# Patient Record
Sex: Female | Born: 1945 | Race: Black or African American | Hispanic: No | Smoking: Never smoker
Health system: Southern US, Community
[De-identification: ages and names within clinical notes are randomized; demographics above are authoritative.]

## PROBLEM LIST (undated history)

## (undated) ENCOUNTER — Ambulatory Visit (HOSPITAL_COMMUNITY): Admission: EM | Payer: Self-pay | Source: Home / Self Care

## (undated) DIAGNOSIS — E119 Type 2 diabetes mellitus without complications: Secondary | ICD-10-CM

## (undated) DIAGNOSIS — I219 Acute myocardial infarction, unspecified: Secondary | ICD-10-CM

## (undated) DIAGNOSIS — E785 Hyperlipidemia, unspecified: Secondary | ICD-10-CM

## (undated) DIAGNOSIS — I10 Essential (primary) hypertension: Secondary | ICD-10-CM

## (undated) DIAGNOSIS — G459 Transient cerebral ischemic attack, unspecified: Secondary | ICD-10-CM

## (undated) DIAGNOSIS — M1990 Unspecified osteoarthritis, unspecified site: Secondary | ICD-10-CM

## (undated) DIAGNOSIS — E7800 Pure hypercholesterolemia, unspecified: Secondary | ICD-10-CM

## (undated) DIAGNOSIS — B029 Zoster without complications: Secondary | ICD-10-CM

## (undated) DIAGNOSIS — G4733 Obstructive sleep apnea (adult) (pediatric): Secondary | ICD-10-CM

## (undated) DIAGNOSIS — I340 Nonrheumatic mitral (valve) insufficiency: Secondary | ICD-10-CM

## (undated) DIAGNOSIS — G4719 Other hypersomnia: Secondary | ICD-10-CM

## (undated) DIAGNOSIS — J189 Pneumonia, unspecified organism: Secondary | ICD-10-CM

## (undated) DIAGNOSIS — H409 Unspecified glaucoma: Secondary | ICD-10-CM

## (undated) HISTORY — PX: EYE SURGERY: SHX253

## (undated) HISTORY — DX: Transient cerebral ischemic attack, unspecified: G45.9

## (undated) HISTORY — DX: Obstructive sleep apnea (adult) (pediatric): G47.33

## (undated) HISTORY — DX: Type 2 diabetes mellitus without complications: E11.9

## (undated) HISTORY — PX: ABDOMINAL SURGERY: SHX537

## (undated) HISTORY — DX: Nonrheumatic mitral (valve) insufficiency: I34.0

## (undated) HISTORY — PX: ROTATOR CUFF REPAIR: SHX139

## (undated) HISTORY — DX: Other hypersomnia: G47.19

## (undated) HISTORY — DX: Essential (primary) hypertension: I10

## (undated) HISTORY — DX: Acute myocardial infarction, unspecified: I21.9

## (undated) HISTORY — DX: Hyperlipidemia, unspecified: E78.5

---

## 1998-03-26 ENCOUNTER — Emergency Department (HOSPITAL_COMMUNITY): Admission: EM | Admit: 1998-03-26 | Discharge: 1998-03-26 | Payer: Self-pay | Admitting: *Deleted

## 1998-05-05 ENCOUNTER — Inpatient Hospital Stay (HOSPITAL_COMMUNITY): Admission: EM | Admit: 1998-05-05 | Discharge: 1998-05-06 | Payer: Self-pay | Admitting: Emergency Medicine

## 1998-05-07 ENCOUNTER — Emergency Department (HOSPITAL_COMMUNITY): Admission: EM | Admit: 1998-05-07 | Discharge: 1998-05-07 | Payer: Self-pay | Admitting: Emergency Medicine

## 1998-05-12 ENCOUNTER — Ambulatory Visit (HOSPITAL_COMMUNITY): Admission: RE | Admit: 1998-05-12 | Discharge: 1998-05-12 | Payer: Self-pay | Admitting: *Deleted

## 1998-05-19 ENCOUNTER — Emergency Department (HOSPITAL_COMMUNITY): Admission: EM | Admit: 1998-05-19 | Discharge: 1998-05-19 | Payer: Self-pay | Admitting: Emergency Medicine

## 1998-05-26 ENCOUNTER — Other Ambulatory Visit: Admission: RE | Admit: 1998-05-26 | Discharge: 1998-05-26 | Payer: Self-pay | Admitting: General Surgery

## 1998-06-02 ENCOUNTER — Ambulatory Visit (HOSPITAL_COMMUNITY): Admission: RE | Admit: 1998-06-02 | Discharge: 1998-06-02 | Payer: Self-pay | Admitting: *Deleted

## 1998-08-05 ENCOUNTER — Emergency Department (HOSPITAL_COMMUNITY): Admission: EM | Admit: 1998-08-05 | Discharge: 1998-08-05 | Payer: Self-pay | Admitting: *Deleted

## 1998-09-14 ENCOUNTER — Encounter: Payer: Self-pay | Admitting: Family Medicine

## 1998-09-14 ENCOUNTER — Ambulatory Visit (HOSPITAL_COMMUNITY): Admission: RE | Admit: 1998-09-14 | Discharge: 1998-09-14 | Payer: Self-pay | Admitting: Family Medicine

## 1998-11-17 ENCOUNTER — Other Ambulatory Visit: Admission: RE | Admit: 1998-11-17 | Discharge: 1998-11-17 | Payer: Self-pay | Admitting: Family Medicine

## 1998-11-22 ENCOUNTER — Encounter: Payer: Self-pay | Admitting: Family Medicine

## 1998-11-22 ENCOUNTER — Ambulatory Visit (HOSPITAL_COMMUNITY): Admission: RE | Admit: 1998-11-22 | Discharge: 1998-11-22 | Payer: Self-pay | Admitting: Family Medicine

## 1999-05-15 ENCOUNTER — Encounter: Payer: Self-pay | Admitting: *Deleted

## 1999-05-15 ENCOUNTER — Encounter: Payer: Self-pay | Admitting: Emergency Medicine

## 1999-05-15 ENCOUNTER — Emergency Department (HOSPITAL_COMMUNITY): Admission: EM | Admit: 1999-05-15 | Discharge: 1999-05-15 | Payer: Self-pay | Admitting: Emergency Medicine

## 1999-07-04 ENCOUNTER — Ambulatory Visit (HOSPITAL_COMMUNITY): Admission: RE | Admit: 1999-07-04 | Discharge: 1999-07-04 | Payer: Self-pay | Admitting: Family Medicine

## 1999-07-04 ENCOUNTER — Encounter: Payer: Self-pay | Admitting: Family Medicine

## 1999-10-05 ENCOUNTER — Encounter: Payer: Self-pay | Admitting: Emergency Medicine

## 1999-10-05 ENCOUNTER — Emergency Department (HOSPITAL_COMMUNITY): Admission: EM | Admit: 1999-10-05 | Discharge: 1999-10-05 | Payer: Self-pay | Admitting: Emergency Medicine

## 1999-11-19 ENCOUNTER — Encounter: Payer: Self-pay | Admitting: Emergency Medicine

## 1999-11-19 ENCOUNTER — Emergency Department (HOSPITAL_COMMUNITY): Admission: EM | Admit: 1999-11-19 | Discharge: 1999-11-19 | Payer: Self-pay | Admitting: Emergency Medicine

## 2000-05-06 ENCOUNTER — Encounter: Admission: RE | Admit: 2000-05-06 | Discharge: 2000-05-06 | Payer: Self-pay | Admitting: Family Medicine

## 2000-05-06 ENCOUNTER — Encounter: Payer: Self-pay | Admitting: Family Medicine

## 2000-05-08 ENCOUNTER — Other Ambulatory Visit: Admission: RE | Admit: 2000-05-08 | Discharge: 2000-05-08 | Payer: Self-pay | Admitting: Internal Medicine

## 2000-05-09 ENCOUNTER — Encounter: Payer: Self-pay | Admitting: Family Medicine

## 2000-05-09 ENCOUNTER — Encounter: Admission: RE | Admit: 2000-05-09 | Discharge: 2000-05-09 | Payer: Self-pay | Admitting: Family Medicine

## 2000-08-11 ENCOUNTER — Encounter: Payer: Self-pay | Admitting: Emergency Medicine

## 2000-08-11 ENCOUNTER — Emergency Department (HOSPITAL_COMMUNITY): Admission: EM | Admit: 2000-08-11 | Discharge: 2000-08-11 | Payer: Self-pay | Admitting: Emergency Medicine

## 2000-09-11 ENCOUNTER — Encounter: Admission: RE | Admit: 2000-09-11 | Discharge: 2000-09-19 | Payer: Self-pay | Admitting: Family Medicine

## 2001-01-28 ENCOUNTER — Emergency Department (HOSPITAL_COMMUNITY): Admission: EM | Admit: 2001-01-28 | Discharge: 2001-01-28 | Payer: Self-pay | Admitting: Emergency Medicine

## 2001-01-28 ENCOUNTER — Ambulatory Visit (HOSPITAL_COMMUNITY): Admission: RE | Admit: 2001-01-28 | Discharge: 2001-01-28 | Payer: Self-pay | Admitting: Family Medicine

## 2001-01-28 ENCOUNTER — Encounter: Payer: Self-pay | Admitting: Emergency Medicine

## 2001-05-03 ENCOUNTER — Encounter: Payer: Self-pay | Admitting: Emergency Medicine

## 2001-05-03 ENCOUNTER — Emergency Department (HOSPITAL_COMMUNITY): Admission: EM | Admit: 2001-05-03 | Discharge: 2001-05-03 | Payer: Self-pay | Admitting: *Deleted

## 2001-07-31 ENCOUNTER — Encounter: Payer: Self-pay | Admitting: Orthopedic Surgery

## 2001-07-31 ENCOUNTER — Encounter: Admission: RE | Admit: 2001-07-31 | Discharge: 2001-07-31 | Payer: Self-pay | Admitting: Orthopedic Surgery

## 2002-01-07 ENCOUNTER — Ambulatory Visit (HOSPITAL_COMMUNITY): Admission: RE | Admit: 2002-01-07 | Discharge: 2002-01-07 | Payer: Self-pay | Admitting: *Deleted

## 2002-03-13 ENCOUNTER — Ambulatory Visit (HOSPITAL_COMMUNITY): Admission: RE | Admit: 2002-03-13 | Discharge: 2002-03-13 | Payer: Self-pay | Admitting: *Deleted

## 2002-05-12 ENCOUNTER — Ambulatory Visit (HOSPITAL_COMMUNITY): Admission: RE | Admit: 2002-05-12 | Discharge: 2002-05-12 | Payer: Self-pay | Admitting: *Deleted

## 2002-05-12 ENCOUNTER — Encounter: Payer: Self-pay | Admitting: *Deleted

## 2002-07-01 ENCOUNTER — Encounter: Payer: Self-pay | Admitting: Family Medicine

## 2002-07-01 ENCOUNTER — Ambulatory Visit (HOSPITAL_COMMUNITY): Admission: RE | Admit: 2002-07-01 | Discharge: 2002-07-01 | Payer: Self-pay | Admitting: Family Medicine

## 2002-07-30 ENCOUNTER — Ambulatory Visit (HOSPITAL_COMMUNITY): Admission: RE | Admit: 2002-07-30 | Discharge: 2002-07-30 | Payer: Self-pay | Admitting: Cardiovascular Disease

## 2002-07-30 ENCOUNTER — Encounter: Payer: Self-pay | Admitting: Cardiovascular Disease

## 2002-12-09 ENCOUNTER — Emergency Department (HOSPITAL_COMMUNITY): Admission: EM | Admit: 2002-12-09 | Discharge: 2002-12-09 | Payer: Self-pay | Admitting: *Deleted

## 2003-05-13 ENCOUNTER — Encounter: Payer: Self-pay | Admitting: Emergency Medicine

## 2003-05-13 ENCOUNTER — Emergency Department (HOSPITAL_COMMUNITY): Admission: AD | Admit: 2003-05-13 | Discharge: 2003-05-13 | Payer: Self-pay | Admitting: Emergency Medicine

## 2003-08-09 ENCOUNTER — Encounter: Payer: Self-pay | Admitting: Family Medicine

## 2003-08-09 ENCOUNTER — Encounter: Admission: RE | Admit: 2003-08-09 | Discharge: 2003-08-09 | Payer: Self-pay | Admitting: Family Medicine

## 2003-09-14 ENCOUNTER — Emergency Department (HOSPITAL_COMMUNITY): Admission: EM | Admit: 2003-09-14 | Discharge: 2003-09-14 | Payer: Self-pay | Admitting: Emergency Medicine

## 2005-12-20 ENCOUNTER — Emergency Department (HOSPITAL_COMMUNITY): Admission: EM | Admit: 2005-12-20 | Discharge: 2005-12-20 | Payer: Self-pay | Admitting: Emergency Medicine

## 2005-12-28 ENCOUNTER — Emergency Department (HOSPITAL_COMMUNITY): Admission: EM | Admit: 2005-12-28 | Discharge: 2005-12-28 | Payer: Self-pay | Admitting: Emergency Medicine

## 2005-12-29 ENCOUNTER — Emergency Department (HOSPITAL_COMMUNITY): Admission: EM | Admit: 2005-12-29 | Discharge: 2005-12-29 | Payer: Self-pay | Admitting: Emergency Medicine

## 2005-12-30 ENCOUNTER — Emergency Department (HOSPITAL_COMMUNITY): Admission: EM | Admit: 2005-12-30 | Discharge: 2005-12-30 | Payer: Self-pay | Admitting: Emergency Medicine

## 2006-01-01 ENCOUNTER — Emergency Department (HOSPITAL_COMMUNITY): Admission: EM | Admit: 2006-01-01 | Discharge: 2006-01-01 | Payer: Self-pay | Admitting: Emergency Medicine

## 2006-02-20 ENCOUNTER — Encounter: Admission: RE | Admit: 2006-02-20 | Discharge: 2006-02-20 | Payer: Self-pay | Admitting: Orthopedic Surgery

## 2006-03-20 ENCOUNTER — Encounter: Payer: Self-pay | Admitting: Emergency Medicine

## 2006-03-28 ENCOUNTER — Encounter: Admission: RE | Admit: 2006-03-28 | Discharge: 2006-03-28 | Payer: Self-pay | Admitting: Orthopedic Surgery

## 2006-04-04 ENCOUNTER — Encounter: Admission: RE | Admit: 2006-04-04 | Discharge: 2006-07-03 | Payer: Self-pay | Admitting: Orthopedic Surgery

## 2006-10-08 ENCOUNTER — Encounter: Admission: RE | Admit: 2006-10-08 | Discharge: 2006-10-08 | Payer: Self-pay | Admitting: Orthopedic Surgery

## 2006-12-10 ENCOUNTER — Ambulatory Visit (HOSPITAL_COMMUNITY): Admission: RE | Admit: 2006-12-10 | Discharge: 2006-12-11 | Payer: Self-pay | Admitting: Orthopedic Surgery

## 2007-01-16 ENCOUNTER — Encounter: Admission: RE | Admit: 2007-01-16 | Discharge: 2007-04-16 | Payer: Self-pay | Admitting: Orthopedic Surgery

## 2007-02-03 ENCOUNTER — Emergency Department (HOSPITAL_COMMUNITY): Admission: EM | Admit: 2007-02-03 | Discharge: 2007-02-03 | Payer: Self-pay | Admitting: Emergency Medicine

## 2007-03-17 ENCOUNTER — Encounter (INDEPENDENT_AMBULATORY_CARE_PROVIDER_SITE_OTHER): Payer: Self-pay | Admitting: Specialist

## 2007-03-17 ENCOUNTER — Ambulatory Visit (HOSPITAL_COMMUNITY): Admission: RE | Admit: 2007-03-17 | Discharge: 2007-03-17 | Payer: Self-pay | Admitting: Gastroenterology

## 2007-12-04 ENCOUNTER — Emergency Department (HOSPITAL_COMMUNITY): Admission: EM | Admit: 2007-12-04 | Discharge: 2007-12-04 | Payer: Self-pay | Admitting: Emergency Medicine

## 2007-12-07 ENCOUNTER — Emergency Department (HOSPITAL_COMMUNITY): Admission: EM | Admit: 2007-12-07 | Discharge: 2007-12-07 | Payer: Self-pay | Admitting: Emergency Medicine

## 2007-12-08 ENCOUNTER — Emergency Department (HOSPITAL_COMMUNITY): Admission: EM | Admit: 2007-12-08 | Discharge: 2007-12-08 | Payer: Self-pay | Admitting: Emergency Medicine

## 2007-12-08 ENCOUNTER — Encounter: Payer: Self-pay | Admitting: Emergency Medicine

## 2008-02-25 ENCOUNTER — Encounter: Admission: RE | Admit: 2008-02-25 | Discharge: 2008-02-25 | Payer: Self-pay | Admitting: Orthopedic Surgery

## 2008-05-20 ENCOUNTER — Encounter: Admission: RE | Admit: 2008-05-20 | Discharge: 2008-05-20 | Payer: Self-pay | Admitting: Orthopedic Surgery

## 2008-06-29 ENCOUNTER — Inpatient Hospital Stay (HOSPITAL_COMMUNITY): Admission: RE | Admit: 2008-06-29 | Discharge: 2008-06-30 | Payer: Self-pay | Admitting: Orthopedic Surgery

## 2008-08-04 ENCOUNTER — Encounter: Admission: RE | Admit: 2008-08-04 | Discharge: 2008-10-04 | Payer: Self-pay | Admitting: Orthopedic Surgery

## 2008-09-08 ENCOUNTER — Encounter: Admission: RE | Admit: 2008-09-08 | Discharge: 2008-09-08 | Payer: Self-pay | Admitting: Orthopedic Surgery

## 2008-10-21 ENCOUNTER — Encounter: Admission: RE | Admit: 2008-10-21 | Discharge: 2008-10-21 | Payer: Self-pay | Admitting: Family Medicine

## 2008-12-10 ENCOUNTER — Encounter: Admission: RE | Admit: 2008-12-10 | Discharge: 2008-12-10 | Payer: Self-pay | Admitting: Family Medicine

## 2009-09-21 ENCOUNTER — Emergency Department (HOSPITAL_COMMUNITY): Admission: EM | Admit: 2009-09-21 | Discharge: 2009-09-21 | Payer: Self-pay | Admitting: Emergency Medicine

## 2010-01-31 ENCOUNTER — Encounter: Admission: RE | Admit: 2010-01-31 | Discharge: 2010-01-31 | Payer: Self-pay | Admitting: Orthopedic Surgery

## 2010-03-24 ENCOUNTER — Encounter: Admission: RE | Admit: 2010-03-24 | Discharge: 2010-03-24 | Payer: Self-pay | Admitting: Orthopedic Surgery

## 2010-06-06 ENCOUNTER — Encounter: Admission: RE | Admit: 2010-06-06 | Discharge: 2010-06-06 | Payer: Self-pay | Admitting: Orthopedic Surgery

## 2010-12-10 ENCOUNTER — Emergency Department (HOSPITAL_COMMUNITY)
Admission: EM | Admit: 2010-12-10 | Discharge: 2010-12-10 | Payer: Self-pay | Source: Home / Self Care | Admitting: Emergency Medicine

## 2010-12-10 ENCOUNTER — Encounter: Payer: Self-pay | Admitting: Orthopedic Surgery

## 2011-02-03 ENCOUNTER — Emergency Department (HOSPITAL_COMMUNITY): Payer: Medicare Other

## 2011-02-03 ENCOUNTER — Emergency Department (HOSPITAL_COMMUNITY)
Admission: EM | Admit: 2011-02-03 | Discharge: 2011-02-03 | Disposition: A | Discharge status: Home / Self Care | Payer: Medicare Other | Attending: Emergency Medicine | Admitting: Emergency Medicine

## 2011-02-03 DIAGNOSIS — R5383 Other fatigue: Secondary | ICD-10-CM | POA: Insufficient documentation

## 2011-02-03 DIAGNOSIS — H409 Unspecified glaucoma: Secondary | ICD-10-CM | POA: Insufficient documentation

## 2011-02-03 DIAGNOSIS — R5381 Other malaise: Secondary | ICD-10-CM | POA: Insufficient documentation

## 2011-02-03 DIAGNOSIS — R079 Chest pain, unspecified: Secondary | ICD-10-CM | POA: Insufficient documentation

## 2011-02-03 DIAGNOSIS — I10 Essential (primary) hypertension: Secondary | ICD-10-CM | POA: Insufficient documentation

## 2011-02-03 DIAGNOSIS — E109 Type 1 diabetes mellitus without complications: Secondary | ICD-10-CM | POA: Insufficient documentation

## 2011-02-03 LAB — CBC
HCT: 33.9 % — ABNORMAL LOW (ref 36.0–46.0)
MCH: 32.1 pg (ref 26.0–34.0)
MCHC: 33 g/dL (ref 30.0–36.0)
MCV: 97.1 fL (ref 78.0–100.0)
Platelets: 229 10*3/uL (ref 150–400)
RDW: 13.6 % (ref 11.5–15.5)
WBC: 5.3 10*3/uL (ref 4.0–10.5)

## 2011-02-03 LAB — URINALYSIS, ROUTINE W REFLEX MICROSCOPIC
Bilirubin Urine: NEGATIVE
Hgb urine dipstick: NEGATIVE
Ketones, ur: NEGATIVE mg/dL
Nitrite: NEGATIVE
Protein, ur: NEGATIVE mg/dL
Specific Gravity, Urine: 1.014 (ref 1.005–1.030)
Urobilinogen, UA: 0.2 mg/dL (ref 0.0–1.0)

## 2011-02-03 LAB — TROPONIN I: Troponin I: 0.01 ng/mL (ref 0.00–0.06)

## 2011-02-03 LAB — DIFFERENTIAL
Eosinophils Absolute: 0.1 10*3/uL (ref 0.0–0.7)
Eosinophils Relative: 2 % (ref 0–5)
Lymphocytes Relative: 37 % (ref 12–46)
Lymphs Abs: 1.9 10*3/uL (ref 0.7–4.0)
Monocytes Absolute: 0.4 10*3/uL (ref 0.1–1.0)

## 2011-02-03 LAB — BASIC METABOLIC PANEL
BUN: 14 mg/dL (ref 6–23)
Calcium: 9.4 mg/dL (ref 8.4–10.5)
Creatinine, Ser: 0.96 mg/dL (ref 0.4–1.2)
GFR calc non Af Amer: 58 mL/min — ABNORMAL LOW (ref >60)
Glucose, Bld: 123 mg/dL — ABNORMAL HIGH (ref 70–99)
Potassium: 3.5 mEq/L (ref 3.5–5.1)

## 2011-02-05 ENCOUNTER — Emergency Department (HOSPITAL_COMMUNITY): Payer: Medicare Other

## 2011-02-05 ENCOUNTER — Encounter (HOSPITAL_COMMUNITY): Payer: Self-pay

## 2011-02-05 ENCOUNTER — Inpatient Hospital Stay (HOSPITAL_COMMUNITY)
Admission: EM | Admit: 2011-02-05 | Discharge: 2011-02-06 | DRG: 392 | Disposition: A | Discharge status: Home / Self Care | Payer: Medicare Other | Attending: Internal Medicine | Admitting: Internal Medicine

## 2011-02-05 ENCOUNTER — Inpatient Hospital Stay (HOSPITAL_COMMUNITY): Payer: Medicare Other

## 2011-02-05 DIAGNOSIS — A088 Other specified intestinal infections: Principal | ICD-10-CM | POA: Diagnosis present

## 2011-02-05 DIAGNOSIS — R197 Diarrhea, unspecified: Secondary | ICD-10-CM | POA: Diagnosis present

## 2011-02-05 DIAGNOSIS — I10 Essential (primary) hypertension: Secondary | ICD-10-CM | POA: Diagnosis present

## 2011-02-05 DIAGNOSIS — E785 Hyperlipidemia, unspecified: Secondary | ICD-10-CM | POA: Diagnosis present

## 2011-02-05 DIAGNOSIS — E119 Type 2 diabetes mellitus without complications: Secondary | ICD-10-CM | POA: Diagnosis present

## 2011-02-05 DIAGNOSIS — E876 Hypokalemia: Secondary | ICD-10-CM | POA: Diagnosis present

## 2011-02-05 DIAGNOSIS — R5383 Other fatigue: Secondary | ICD-10-CM | POA: Diagnosis present

## 2011-02-05 DIAGNOSIS — R5381 Other malaise: Secondary | ICD-10-CM | POA: Diagnosis present

## 2011-02-05 DIAGNOSIS — R0789 Other chest pain: Secondary | ICD-10-CM | POA: Diagnosis present

## 2011-02-05 LAB — COMPREHENSIVE METABOLIC PANEL
BUN: 10 mg/dL (ref 6–23)
CO2: 28 mEq/L (ref 19–32)
Calcium: 10 mg/dL (ref 8.4–10.5)
Chloride: 105 mEq/L (ref 96–112)
Creatinine, Ser: 0.94 mg/dL (ref 0.4–1.2)
GFR calc Af Amer: >60 mL/min (ref >60)
GFR calc non Af Amer: 60 mL/min — ABNORMAL LOW (ref >60)
Total Bilirubin: 0.6 mg/dL (ref 0.3–1.2)

## 2011-02-05 LAB — DIFFERENTIAL
Basophils Relative: 0 % (ref 0–1)
Eosinophils Absolute: 0.1 10*3/uL (ref 0.0–0.7)
Lymphs Abs: 2.3 10*3/uL (ref 0.7–4.0)
Monocytes Absolute: 0.4 10*3/uL (ref 0.1–1.0)
Monocytes Relative: 6 % (ref 3–12)
Neutrophils Relative %: 54 % (ref 43–77)

## 2011-02-05 LAB — URINALYSIS, ROUTINE W REFLEX MICROSCOPIC
Bilirubin Urine: NEGATIVE
Glucose, UA: NEGATIVE mg/dL
Hgb urine dipstick: NEGATIVE
Ketones, ur: NEGATIVE mg/dL
Protein, ur: NEGATIVE mg/dL
Urobilinogen, UA: 0.2 mg/dL (ref 0.0–1.0)

## 2011-02-05 LAB — CBC
Hemoglobin: 13 g/dL (ref 12.0–15.0)
MCH: 32 pg (ref 26.0–34.0)
MCHC: 33.5 g/dL (ref 30.0–36.0)
MCV: 95.6 fL (ref 78.0–100.0)
Platelets: 268 10*3/uL (ref 150–400)
RBC: 4.06 MIL/uL (ref 3.87–5.11)

## 2011-02-05 LAB — CK TOTAL AND CKMB (NOT AT ARMC)
CK, MB: 1.3 ng/mL (ref 0.3–4.0)
Relative Index: 1.1 (ref 0.0–2.5)
Total CK: 120 U/L (ref 7–177)

## 2011-02-05 LAB — CARDIAC PANEL(CRET KIN+CKTOT+MB+TROPI)
CK, MB: 1 ng/mL (ref 0.3–4.0)
Total CK: 99 U/L (ref 7–177)

## 2011-02-05 LAB — GLUCOSE, CAPILLARY: Glucose-Capillary: 84 mg/dL (ref 70–99)

## 2011-02-05 MED ORDER — IOHEXOL 300 MG/ML  SOLN
100.0000 mL | Freq: Once | INTRAMUSCULAR | Status: AC | PRN
  Administered 2011-02-05: 100 mL via INTRAVENOUS

## 2011-02-06 LAB — HEMOGLOBIN A1C: Mean Plasma Glucose: 140 mg/dL — ABNORMAL HIGH (ref <117)

## 2011-02-06 LAB — TSH: TSH: 0.53 u[IU]/mL (ref 0.350–4.500)

## 2011-02-06 LAB — CBC
HCT: 37.5 % (ref 36.0–46.0)
Hemoglobin: 12.3 g/dL (ref 12.0–15.0)
MCH: 31.4 pg (ref 26.0–34.0)
MCHC: 32.8 g/dL (ref 30.0–36.0)
MCV: 95.7 fL (ref 78.0–100.0)

## 2011-02-06 LAB — CLOSTRIDIUM DIFFICILE BY PCR: Toxigenic C. Difficile by PCR: NEGATIVE

## 2011-02-06 LAB — BASIC METABOLIC PANEL
BUN: 6 mg/dL (ref 6–23)
CO2: 28 mEq/L (ref 19–32)
Calcium: 9.5 mg/dL (ref 8.4–10.5)
Glucose, Bld: 98 mg/dL (ref 70–99)
Sodium: 142 mEq/L (ref 135–145)

## 2011-02-06 LAB — LIPID PANEL
Cholesterol: 308 mg/dL — ABNORMAL HIGH (ref 0–200)
HDL: 54 mg/dL (ref >39)
Total CHOL/HDL Ratio: 5.7 RATIO
Triglycerides: 223 mg/dL — ABNORMAL HIGH (ref <150)

## 2011-02-06 LAB — CARDIAC PANEL(CRET KIN+CKTOT+MB+TROPI)
Total CK: 110 U/L (ref 7–177)
Troponin I: 0.01 ng/mL (ref 0.00–0.06)

## 2011-02-06 NOTE — Discharge Summary (Signed)
Beth Hubbard, Beth Hubbard                 ACCOUNT NO.:  [id number]  MEDICAL RECORD NO.:  [id number]           PATIENT TYPE:  I  LOCATION:  1425                         FACILITY:  [facility]  PHYSICIAN:  Hillery Aldo, M.D.   DATE OF BIRTH:  [birth year]  DATE OF ADMISSION:  02/05/2011 DATE OF DISCHARGE:  02/06/2011                              DISCHARGE SUMMARY   PRIMARY CARE PHYSICIAN:  Renaye Rakers, M.D.  DISCHARGE DIAGNOSES: 1. Persistent diarrhea. 2. Generalized weakness. 3. Hypokalemia. 4. Atypical chest pain. 5. Hypertension. 6. Type 2 diabetes. 7. Hyperlipidemia. 8. Mild dehydration.  DISCHARGE MEDICATIONS: 1. Crestor 5 mg p.o. daily. 2. Potassium chloride 20 mEq p.o. b.i.d. p.r.n., may discontinue when     diarrhea improved. 3. Lomotil 2.5/0.025 one to two tablets p.o. q.i.d. p.r.n. diarrhea. 4. Multivitamin 1 tablet p.o. daily. 5. Ambien 10 mg p.o. q.h.s. 6. Amlodipine 5 mg p.o. daily. 7. Artificial tears one drop in both eyes daily p.r.n. dry eye. 8. Hydrocodone/APAP 10/325 one tablet p.o. q.8h. p.r.n. pain.  NOTE:  The patient was on multiple medications that she discontinued on her own including Cilostazol, Plavix, dexamethasone, and Januvia.  She is instructed to follow up with her primary care physician regarding whether or not to resume these medications.  CONSULTATIONS:  None.  BRIEF ADMISSION HPI:  The patient is a 64 year old female who presented to the hospital with a chief complaint of persistent diarrhea.  The patient had actually been evaluated in the Emergency Department on February 03, 2011 and was discharged home with instructions to discontinue taking Ambien at of concerns that it was contributing to her complaints of weakness.  She subsequently represented with ongoing complaints of diarrhea and weakness on February 05, 2011 and subsequently was referred to the Hospitalist Service for inpatient evaluation and treatment.  For the full details, please see the  dictated report done by Dr. Kerry Hough.  PROCEDURES AND DIAGNOSTIC STUDIES: 1. Acute abdominal series on February 05, 2011 showed no acute findings. 2. CT scan of the abdomen and pelvis on February 06, 2011 showed no acute     process in the abdomen or pelvis.  DISCHARGE LABORATORY VALUES:  TSH was 0.530.  Clostridium difficile by PCR was negative.  Lipids showed a cholesterol of 308, triglycerides 223, HDL 54, LDL 209.  Sodium is 142, potassium 3.0, chloride 106, bicarb 28, BUN 6, creatinine 0.73, glucose 98, calcium 9.5.  White blood cell count was 5.9, hemoglobin 12.3, hematocrit 37.5, platelets 259,000. Hemoglobin A1c of 6.5.  Cardiac markers were negative x3 sets and BNP was less than 30.  Urinalysis was negative for nitrites and leukocytes. Lipase was 25.  HOSPITAL COURSE: 1. PERSISTENT DIARRHEA:  The patient was admitted and a diagnostic     evaluation did not reveal any evidence of colitis on CT scanning or     any evidence of Clostridium difficile superinfection.  This is most     likely due to a viral gastroenteritis and she was hydrated and     treated symptomatically.  The patient is currently reporting no     further diarrhea and at this point,  can safely be discharged home     with symptomatic treatment. 2. GENERALIZED WEAKNESS:  Likely due to hypokalemia and persistent     diarrhea.  The patient's thyroid function was normal.  We will put     her on potassium supplementation with instructions to follow up     with her primary care physician for non-resolution of symptoms. 3. HYPOKALEMIA:  Secondary to GI losses.  The patient was given 40 mEq     of p.o. potassium prior to discharge and put on home potassium     supplementation with instructions to take while having active bouts     of diarrhea. 4. ATYPICAL CHEST PAIN:  The patient did complain of transient sharp     chest pain.  She was monitored on telemetry with no arrhythmic     events and cardiac markers were cycled q.8h. x3  sets with no     elevations. 5. HYPERTENSION:  The patient's blood pressure is currently well     controlled on Norvasc. 6. TYPE 2 DIABETES:  The patient appears to have excellent outpatient     control given her hemoglobin A1c of 6.5.  She has stopped taking     Januvia and would not resume this medication as she appears to be     diet controlled. 7. HYPERLIPIDEMIA:  The patient did have a fasting lipid panel done     which showed a cholesterol of 308, triglycerides 223, HDL 54, and     LDL 209.  She was instructed to resume her statin therapy. 8. MILD DEHYDRATION:  The patient was gently hydrated overnight and at     this point, has improvement in her BUN to creatinine ratio.  Given     that, she is not having any further episodes of diarrhea, she can     safely be discharged home.  CONDITION ON DISCHARGE:  Stable.  DISCHARGE DIET:  Carbohydrate modified, heart healthy.  INSTRUCTIONS FOR FOLLOWUP:  Follow up with PCP in 1 week or sooner for non-resolution of symptoms.  Time spent in coordinating care for discharge and discharge instructions including face-to-face time is 35 minutes.     Hillery Aldo, M.D.     CR/MEDQ  D:  02/06/2011  T:  02/06/2011  Job:  [id number]  cc:   Renaye Rakers, M.D. Fax: [phone]  Electronically Signed by Hillery Aldo M.D. on 02/06/2011 30:86:57 PM

## 2011-02-13 NOTE — H&P (Signed)
Beth Hubbard, Beth Hubbard                 ACCOUNT NO.:  [id number]  MEDICAL RECORD NO.:  [id number]           PATIENT TYPE:  I  LOCATION:  1425                         FACILITY:  [facility]  PHYSICIAN:  Erick Blinks, MD     DATE OF BIRTH:  Mar 09, 1946  DATE OF ADMISSION:  02/05/2011 DATE OF DISCHARGE:                             HISTORY & PHYSICAL   PRIMARY CARE PHYSICIAN:  Renaye Rakers, MD  CHIEF COMPLAINT:  Persistent diarrhea and generalized weakness.  HISTORY OF PRESENT ILLNESS:  This is a 65 year old African American female with history of hypertension, non-insulin dependent diabetes, glaucoma, sciatica, and arthritis who presents to the emergency room with complaints of persistent diarrhea and generalized weakness.  The patient was recently in the ER a few days ago with the same complaints, was given IV fluids, had felt better, and was discharged home at that point.  She returns with her daughter today with complains of increasingweakness, decreased p.o. intake, and persistent diarrhea.  She reports having the diarrhea for the past 2 weeks or so, being worse in the first week and currently having 4-5 bowel movements every day which are large volume.  She denies any melena or hematochezia and she has not been on any recent antibiotics.  She denies any sick contacts.  She has not had any nausea or vomiting.  She does not describe any abdominal pain.  She reports feeling feverish this past week, but did not have a thermometer to check her temperature.  She also complains of left-sided chest pain under her breast, she describes it as sharp, stabbing pain which lasts 2- 3 minutes, is nonradiating, and occurs on exertion as well as rest.  She denies any dysuria, any unilateral weakness or numbness, changes in vision.  She does feel generally weak and is having difficulty ambulating.  She also reports having nonproductive cough for the past few weeks as well.  Due to multiple ongoing issues,  she has been referred for admission.  PAST MEDICAL HISTORY: 1. Hypertension. 2. Arthritis. 3. Non-insulin dependent diabetes. 4. Glaucoma. 5. Sciatic.  ALLERGIES: 1. PENICILLIN causes rash. 2. IBUPROFEN causes palpitations.  SOCIAL HISTORY:  She denies any alcohol or tobacco use.  She lives alone.  FAMILY HISTORY:  Her mother had hypertension, diabetes.  Her father had throat cancer.  REVIEW OF SYSTEMS:  As per HPI.  MEDICATIONS:  Prior to admission, 1. Ambien 10 mg p.o. daily at bedtime. 2. Hydrocodone/APAP 10/325 mg 1 tablet every 8 hours as needed. 3. Artificial tears both eyes 1 drop daily as needed. 4. Amlodipine 5 mg 1 tablet daily.  The patient was previously on, 1. Cilostazol 100 mg twice daily. 2. Plavix 75 mg once daily. 3. Dexamethasone 4 mg a day. 4. Crestor 5 mg once daily. 5. Januvia 100 mg daily.  She says she has stopped taking all of these medications because she feels that she does not need them anymore and is trying to continue without them.  PHYSICAL EXAMINATION:  VITAL SIGNS:  Temperature 98.8, respiratory rate 20, pulse of 71, blood pressure 142/65. GENERAL:  The patient is in no acute  distress lying in bed. HEENT:  Normocephalic, atraumatic.  Pupils are equal, round, reactive to light. NECK:  Supple. CHEST:  Clear to auscultation bilaterally. CARDIAC:  S1 and S2 with regular rate and rhythm. ABDOMEN:  Soft.  There is mild periumbilical tenderness.  Bowel sounds are active. EXTREMITIES:  No cyanosis, clubbing, or edema. NEUROLOGIC:  The patient has equal strength bilaterally.  Cranial nerves II through XII are grossly intact.  LABORATORY DATA:  Sodium 141, potassium 3.5, chloride 105, bicarbonate 28, BUN 10, creatinine 0.94, glucose 103, calcium of 10.  WBC 6, hemoglobin 13, platelets of 269.  BN peptide less than 30, troponin 0.01.  Urinalysis is negative.  Acute abdominal series shows no acute disease.  EKG is normal EKG.  ASSESSMENT  AND PLAN: 1. Generalized weakness. 2. Persistent diarrhea. 3. Chest pain. 4. Hypertension. 5. Non-insulin-dependent diabetes. 6. Hyperlipidemia. 7. Mild dehydration. 8. Chronic back pain. 9. Full code.  PLAN: 1. We will admit the patient to Telemetry and cycle her cardiac     enzymes.  She has had normal cardiac catheterization back in 2001.     We will rule out for any acute coronary syndrome with serial     enzymes. 2. For her persistent diarrhea, we will check stool C difficile.  We     will also check for ova, parasites as well as a culture.  We will     ask for a CT abdomen and pelvis since she is having abdominal pain     associated with that.  For her generalized weakness, we will     continue with IV fluids and ask for PT/OT to see her. 3. We will continue her on sliding scale insulin for now for her     diabetes as well as continue her Norvasc for hypertension.  Further orders will per the clinical course.     Erick Blinks, MD     JM/MEDQ  D:  02/05/2011  T:  02/05/2011  Job:  [id number]  cc:   Renaye Rakers, M.D. Fax: [phone]  Electronically Signed by Erick Blinks  on 02/13/2011 07:15:27 PM

## 2011-02-21 LAB — COMPREHENSIVE METABOLIC PANEL
ALT: 14 U/L (ref 0–35)
BUN: 14 mg/dL (ref 6–23)
CO2: 32 mEq/L (ref 19–32)
Calcium: 9.5 mg/dL (ref 8.4–10.5)
Creatinine, Ser: 0.83 mg/dL (ref 0.4–1.2)
GFR calc non Af Amer: >60 mL/min (ref >60)
Glucose, Bld: 99 mg/dL (ref 70–99)
Sodium: 144 mEq/L (ref 135–145)
Total Protein: 7.1 g/dL (ref 6.0–8.3)

## 2011-02-21 LAB — URINALYSIS, ROUTINE W REFLEX MICROSCOPIC
Hgb urine dipstick: NEGATIVE
Nitrite: NEGATIVE
Protein, ur: NEGATIVE mg/dL
Specific Gravity, Urine: 1.023 (ref 1.005–1.030)
Urobilinogen, UA: 0.2 mg/dL (ref 0.0–1.0)

## 2011-02-21 LAB — CBC
Hemoglobin: 11.6 g/dL — ABNORMAL LOW (ref 12.0–15.0)
MCHC: 33.4 g/dL (ref 30.0–36.0)
MCV: 100.2 fL — ABNORMAL HIGH (ref 78.0–100.0)
RDW: 13.5 % (ref 11.5–15.5)

## 2011-02-21 LAB — DIFFERENTIAL
Lymphocytes Relative: 19 % (ref 12–46)
Lymphs Abs: 1.1 10*3/uL (ref 0.7–4.0)
Monocytes Relative: 5 % (ref 3–12)
Neutro Abs: 4.5 10*3/uL (ref 1.7–7.7)
Neutrophils Relative %: 74 % (ref 43–77)

## 2011-02-21 LAB — PROTIME-INR
INR: 1.07 (ref 0.00–1.49)
Prothrombin Time: 13.8 seconds (ref 11.6–15.2)

## 2011-02-21 LAB — APTT: aPTT: 25 seconds (ref 24–37)

## 2011-02-21 LAB — POCT CARDIAC MARKERS: Myoglobin, poc: 93.2 ng/mL (ref 12–200)

## 2011-04-03 NOTE — Op Note (Signed)
NAMECURTISHA, Beth Hubbard                 ACCOUNT NO.:  [id number]   MEDICAL RECORD NO.:  [id number]          PATIENT TYPE:  INP   LOCATION:  5011                         FACILITY:  [facility]   PHYSICIAN:  Myrtie Neither, MD      DATE OF BIRTH:  August 28, 1946   DATE OF PROCEDURE:  06/29/2008  DATE OF DISCHARGE:                               OPERATIVE REPORT   PREOPERATIVE DIAGNOSES:  Impingement syndrome, right shoulder; superior  labrum anterior and posterior tear, right shoulder; and rotator cuff  tear, right shoulder.   POSTOPERATIVE DIAGNOSES:  Labral tear, right shoulder; rotator cuff  tear, right shoulder; and impingement syndrome, right shoulder.   ANESTHESIA:  General.   PROCEDURE:  Arthroscopic acromioplasty and decompression and  synovectomy, right shoulder; arthroscopic SLAP lesion repair, right  shoulder; mini open rotator cuff repair, right shoulder.   INSTRUMENTATION:  Labral repair with Biomet anchor and rotator cuff  repair with Biomet anchor.   The patient was taken to the operating room and was given adequate preop  medications, given general anesthesia and intubated.  The patient was  placed in a barber-chair position.  Right shoulder was prepped with  DuraPrep and draped in sterile manner.  One-half inch puncture wound  made posterior, Swiss wire was placed from posterior to anterior, and  inflow of water through the anterior flow.  A separate small incision  made laterally for the shaver.  Inspection revealed hypertrophic growth  of the subacromial bursal sac with chondromalacic changes of the  subacromial surface.  Rotator cuff was torn anteriorly.  With the use of  synovial shaver, complete synovectomy was done followed by acromioplasty  with use of a bur and meniscal shaver.  Inspection inside the joint  revealed labral tear along the 10-11 o'clock position.  A fourth  incision was made for the trocar.  With the use of arthroscopic rasp,  border of the glenoid where  the labral to attach was roughened and was  smoothed up with the meniscal shaver.  Loose fragments were removed with  the shaver.  The guide was pressed down against the glenoid and a drill  hole made into the glenoid for the anchor.  The anchor was then pressed  in place and tapped in place and was found to be stable.  Suture passer  was used to place it through the glenoid lip through a separate trocar  opening.  The suture was retrieved and then the labral tear was tied  down with the use of a suture tie and knot tie, it was found to be nice  and snug.  Further irrigation was done.  Next, third procedure was a  mini-open incision made extending the lateral incision going through  skin and subcutaneous tissue down.  The deltoid was split, the arm was  externally rotated bringing the cuff tear to visibility.  Suture anchor  was then placed into the tuberosity followed by passing of the sutures  through the cuff and closing the gap.  Copious irrigation was further  done.  Wound closure was then done  with 0 Vicryl for  the fascia, 2-0 for subcutaneous, and skin staples for  the skin.  Compressive dressing was applied.  The patient was placed in  a shoulder abduction pillow brace.  He tolerated the procedure quite  well, went to the recovery room in stable and satisfactory condition.      Myrtie Neither, MD  Electronically Signed     AC/MEDQ  D:  06/29/2008  T:  06/30/2008  Job:  [id number]

## 2011-04-06 NOTE — Op Note (Signed)
NAMELUWANNA, BROSSMAN                 ACCOUNT NO.:  [id number]   MEDICAL RECORD NO.:  [id number]          PATIENT TYPE:  AMB   LOCATION:  ENDO                         FACILITY:  [facility]   PHYSICIAN:  Anselmo Rod, M.D.  DATE OF BIRTH:  Dec 02, 1945   DATE OF PROCEDURE:  03/17/2007  DATE OF DISCHARGE:                               OPERATIVE REPORT   PROCEDURE PERFORMED:  Esophagogastroduodenoscopy with multiple cold  biopsies.   ENDOSCOPIST:  Anselmo Rod, M.D.   INSTRUMENT USED:  Pentax video panendoscope.   INDICATIONS FOR PROCEDURE:  A 65 year old African female with a history  of anemia and guaiac positive stools.  Family history of stomach cancer.  Rule out peptic ulcer disease, esophagitis, masses, etc.   PREPROCEDURE PREPARATION:  Informed consent was procured from the  patient.  The patient fasted for 8 hours prior to the procedure and  prepped with a gallon of TriLyte the night prior to procedure.  Risks  and benefits of the procedure were discussed with the patient in great  detail.   PREPROCEDURE PHYSICAL:  VITAL SIGNS:  The patient had stable vital  signs.  NECK:  Supple.  LUNGS:  Clear to auscultation.  HEART:  S1 and S2 regular.  ABDOMEN:  Soft with normal bowel sounds.   DESCRIPTION OF PROCEDURE:  The patient was placed in the left lateral  decubitus position and sedated by 5 mcg of Fentanyl and 5 mg of Versed  given intravenously in slow incremental doses.  Once the patient was  adequately sedated and maintained on low-flow oxygen and continuous  cardiac monitoring,  the Pentax video panendoscope was advanced through  the mouthpiece over the tongue into the esophagus under direct vision.  The entire esophagus appeared widely patent with no evidence of ring,  strictures,  masses, esophagitis, or Barrett's mucosa.  Small hiatal  hernia was seen on high retroflexion.  There was an erosion with a  prominent fold seen in the antrum that was biopsied for pathology  to  rule out H pylori versus malignancy.  The proximal small bowel appeared  normal.  There was no outlet obstructions.  Small bowel biopsies were  done to rule out sprue.  The patient tolerated the procedure well  without immediate complications.   IMPRESSION:  1. Normal esophagus.  2. Small hiatal hernia.  3. An isolated erosion with a prominent fold biopsied from the antrum.  4. Normal proximal small bowel biopsies done to rule out sprue.   RECOMMENDATIONS:  1. Await pathology results.  2. Avoid all nonsteroidals including aspirin for now.  3. Proceed with a colonoscopy at this time. Further recommendations      made thereafter.      Anselmo Rod, M.D.  Electronically Signed     JNM/MEDQ  D:  03/17/2007  T:  03/18/2007  Job:  [id number]   cc:   Renaye Rakers, M.D.

## 2011-04-06 NOTE — Op Note (Signed)
[name], Beth Hubbard                 ACCOUNT NO.:  [id number]   MEDICAL RECORD NO.:  [id number]          PATIENT TYPE:  AMB   LOCATION:  ENDO                         FACILITY:  [facility]   PHYSICIAN:  Anselmo Rod, M.D.  DATE OF BIRTH:  Jun 12, 1946   DATE OF PROCEDURE:  03/17/2007  DATE OF DISCHARGE:                               OPERATIVE REPORT   PROCEDURE PERFORMED:  Screening colonoscopy.   ENDOSCOPIST:  Anselmo Rod, M.D.   INSTRUMENT USED:  Pentax video colonoscope.   INDICATIONS FOR PROCEDURE:  A 65 year old African female underwent a  screening colonoscopy. The patient had a history of iron-deficiency  anemia and guaiac positive stools.  Rule out colonic polyps, masses,  etc.   PREPROCEDURE PREPARATION:  Informed consent was procured from the  patient. The patient fasted for 8 hours prior to the procedure and  prepped with a gallon of TriLyte the night prior to the procedure. The  risks and benefits of the procedure including a 10% miss rate of cancer  and polyps were discussed with the patient as well.   PREPROCEDURE PHYSICAL:  VITAL SIGNS:  The patient had stable vital  signs.  NECK:  Supple.  CHEST:  Clear to auscultation.  CARDIOVASCULAR:  S1, S2 regular.  ABDOMEN:  Soft with normal bowel sounds.   DESCRIPTION OF PROCEDURE:  The patient was placed in the left lateral  decubitus position and sedated with an additional 75 mcg of Fentanyl and  5 mg of Versed given intravenously in slow incremental doses.  Once the  patient was adequately sedated and maintained on low-flow oxygen and  continuous cardiac monitoring, the Pentax video colonoscope was advanced  from the rectum to the cecum.  There was some residual stool in the  colon.  Multiple washes were done.  No masses, polyps, erosions,  ulcerations or diverticula were seen.  Retroflexion in the rectum  revealed no abnormalities.  Small lesions could be missed secondary to a  relatively poor prep.  The appendiceal  orifice and cecal valve were  photographed.  The terminal ileum appeared healthy without lesions   IMPRESSION:  1. Normal colonoscopy of the terminal ileum. No masses, polyps,      erosions, ulcerations or diverticula seen.  2. Significant amount of residual stool in the colon.  Multiple washes      and small lesions could be missed.   RECOMMENDATIONS:  1. Continue high fiber diet with liberal fluid intake.  2. Repeat colonoscopy in the next 5 years unless the patient has any      abnormal symptoms in interim.  3. Outpatient follow-up in the next 2 weeks for further      recommendations.      Anselmo Rod, M.D.  Electronically Signed     JNM/MEDQ  D:  03/18/2007  T:  03/18/2007  Job:  [id number]   cc:   Renaye Rakers, M.D.

## 2011-04-06 NOTE — Op Note (Signed)
NAMEEFFA, YARROW                 ACCOUNT NO.:  [id number]   MEDICAL RECORD NO.:  [id number]          PATIENT TYPE:  AMB   LOCATION:  DFTL                         FACILITY:  [facility]   PHYSICIAN:  Myrtie Neither, MD      DATE OF BIRTH:  02/16/1946   DATE OF PROCEDURE:  DATE OF DISCHARGE:                               OPERATIVE REPORT   PREOPERATIVE DIAGNOSIS:  1. Impingement syndrome, left shoulder.  2. Rotator cuff tear left shoulder.   POSTOPERATIVE DIAGNOSIS:  Same.   ANESTHESIA:  General.   PROCEDURE:  1. Arthroscopic acromioplasty and synovectomy, left shoulder.  2. Mini-open incision, rotator cuff repair, with use of anchor and      fiber wire.   ANESTHESIA:  General.   PROCEDURE:  The patient was taken to the operating room after given  adequate preop medications and given general anesthesia.  She was  intubated.  Left shoulder was prepped with DuraPrep and draped in a  sterile manner.  The patient placed in barber chair position.  A 1/2-  inch puncture wound was made posteriorly, going through the skin and  subcutaneous tissues.  A switcher rod was placed in posterior to  anterior and inferior incision was made.  The scope was placed  posteriorly, and inspection of the joint revealed hypertrophic  subacromial bursal sac, eburnation and chondromalacia changes of the  subacromial surface.  Rotator cuff tear anteriorly, a fish-mouth type  tear.  Complete synovectomy was done of the subacromial bursal sac  followed by acromioplasty and decompression.  After adequate  decompression and debridement, debridement of the cuff edges was also  done.  Next a mini-incision was made laterally extending the lateral  puncture wound, going through the skin and subcutaneous tissue splitting  the deltoid muscle.  Tear was identified.  Edges were debrided and  roughened.  Anchor suture was used to pull the cuff tendon back down to  the bone, and #2 fiber wire was used to reapproximate the  cuff also.  This drill hole was made into the greater tuberosity.  After adequate  repair, further inspection did not reveal any impingement or entrapment.  Copious irrigation was then done followed by wound closure, 0-Vicryl for  the deltoid, 2-0 for the subcutaneous and skin staples for the skin.  Compressive dressing was applied.  The patient was placed in shoulder  abduction pillow brace.  The patient tolerated the procedure quite well  and went to the recovery room in stable and satisfactory condition.   The patient is being kept 23 hours, to be discharged on Percocet one to  two every four hours p.r.n. for pain and is to remain in the brace at  all times.  The patient is to return to the office in one week.  The  patient is being discharged in stable and satisfactory condition.  End  of dictation.      Myrtie Neither, MD  Electronically Signed     AC/MEDQ  D:  12/10/2006  T:  12/10/2006  Job:  [id number]

## 2011-04-06 NOTE — Cardiovascular Report (Signed)
NAMEJESYKA, Beth Hubbard                             ACCOUNT NO.:  [id number]   MEDICAL RECORD NO.:  [id number]                   PATIENT TYPE:  OIB   LOCATION:  2855                                 FACILITY:  [facility]   PHYSICIAN:  Runell Gess, M.D.             DATE OF BIRTH:  08-Jan-1946   DATE OF PROCEDURE:  DATE OF DISCHARGE:  07/30/2002                              CARDIAC CATHETERIZATION   INDICATIONS FOR PROCEDURE:  The patient is a 65 year old African-American  female from [place] with a history of remote cardiac catheterization in 1999  by Dr. Neldon Labella apparently revealing normal coronary arteries.  She  is referred because of chest pain.  She does have hyperlipidemia, and a  positive family history of heart disease.  A Cardiolite stress test was  performed August 21 that showed breast attenuation plus or minus  anteroapical ischemia.  She presents now for diagnostic coronary  arteriography.   DESCRIPTION OF PROCEDURE:  The patient was brought to the second floor [facility] cardiac catheterization lab in the postabsorptive state.  She was  premedicated with p.o. Valium.  Her right groin was prepped and shaved in  the usual sterile fashion.  Xylocaine, 1%, was used for local anesthesia.  A  #6 French sheath was inserted into the right femoral artery using standard  Seldinger technique.  The #6 French right and left Judkins coronary  catheters along with a #6 French pigtail catheter were used for selective  coronary angiography, left ventriculography, selectively.  Omnipaque dye was  used for the entirety of the case.  Retrograde aortic and left ventricular  blood pressures were recorded.   HEMODYNAMICS:  1. Aortic systolic pressure 214, diastolic pressure 95.  2. Left ventricular systolic pressure 215, end diastolic pressure 17.   SELECTIVE CORONARY ANGIOGRAPHY:  1. Left main:  Normal.  2. LAD:  Normal.  3. Left circumflex:  Normal.  4. Ramus intermedius branch  is small and normal.  5. Right coronary artery is dominant and normal.   LEFT VENTRICULOGRAPHY:  RAO left ventriculogram was performed using 25 cc of  Omnipaque dye at 12 cc per second.  The overall LVEF was estimated at  greater than 60% without focal wall motion abnormalities.   IMPRESSION:  The patient has, again, normal coronary arteries  angiographically and normal left ventricular function.  I believe her chest  pain is noncardiac and her Cardiolite stress test was false positive  secondary to breast attenuation.  She will be treated empirically with  proton pump inhibitor for gastroesophageal reflux disease.   The sheath was removed and the right femoral arterial puncture site was  hemostatically sealed with the Perclose-S device.  The patient left the lab  in stable condition.  She will be discharged home today as an outpatient.  We will see her back in the office in approximately two weeks in followup.  Dr. Parke Simmers was  notified of these results.                                                Runell Gess, M.D.    JJB/MEDQ  D:  07/30/2002  T:  07/31/2002  Job:  [id number]   cc:   Cardiac Catheterization Lab   [facility]   Geraldo Pitter, M.D.

## 2011-08-17 LAB — CBC
HCT: 34.8 — ABNORMAL LOW
MCHC: 34.2
MCV: 98.5
Platelets: 209
WBC: 6.1

## 2011-08-17 LAB — COMPREHENSIVE METABOLIC PANEL
AST: 21
Albumin: 4.4
BUN: 10
CO2: 33 — ABNORMAL HIGH
Calcium: 9.6
Chloride: 102
Creatinine, Ser: 0.98
GFR calc Af Amer: >60
GFR calc non Af Amer: 58 — ABNORMAL LOW
Total Bilirubin: 0.7

## 2011-08-17 LAB — URINALYSIS, ROUTINE W REFLEX MICROSCOPIC
Nitrite: NEGATIVE
Specific Gravity, Urine: 1.025
Urobilinogen, UA: 0.2
pH: 5.5

## 2011-08-17 LAB — URINE MICROSCOPIC-ADD ON

## 2011-08-17 LAB — GLUCOSE, CAPILLARY: Glucose-Capillary: 112 — ABNORMAL HIGH

## 2011-12-05 DIAGNOSIS — M5137 Other intervertebral disc degeneration, lumbosacral region: Secondary | ICD-10-CM | POA: Diagnosis not present

## 2011-12-10 ENCOUNTER — Other Ambulatory Visit: Payer: Self-pay | Admitting: Orthopedic Surgery

## 2011-12-10 ENCOUNTER — Ambulatory Visit
Admission: RE | Admit: 2011-12-10 | Discharge: 2011-12-10 | Disposition: A | Discharge status: Home / Self Care | Payer: Medicare Other | Source: Ambulatory Visit | Attending: Orthopedic Surgery | Admitting: Orthopedic Surgery

## 2011-12-10 DIAGNOSIS — M5126 Other intervertebral disc displacement, lumbar region: Secondary | ICD-10-CM

## 2011-12-10 DIAGNOSIS — IMO0002 Reserved for concepts with insufficient information to code with codable children: Secondary | ICD-10-CM | POA: Diagnosis not present

## 2011-12-10 DIAGNOSIS — M5137 Other intervertebral disc degeneration, lumbosacral region: Secondary | ICD-10-CM | POA: Diagnosis not present

## 2011-12-10 DIAGNOSIS — M47817 Spondylosis without myelopathy or radiculopathy, lumbosacral region: Secondary | ICD-10-CM | POA: Diagnosis not present

## 2011-12-12 DIAGNOSIS — E119 Type 2 diabetes mellitus without complications: Secondary | ICD-10-CM | POA: Diagnosis not present

## 2011-12-28 DIAGNOSIS — M5430 Sciatica, unspecified side: Secondary | ICD-10-CM | POA: Diagnosis not present

## 2012-02-08 DIAGNOSIS — M7120 Synovial cyst of popliteal space [Baker], unspecified knee: Secondary | ICD-10-CM | POA: Diagnosis not present

## 2012-03-12 DIAGNOSIS — E1110 Type 2 diabetes mellitus with ketoacidosis without coma: Secondary | ICD-10-CM | POA: Diagnosis not present

## 2012-03-12 DIAGNOSIS — G4700 Insomnia, unspecified: Secondary | ICD-10-CM | POA: Diagnosis not present

## 2012-03-12 DIAGNOSIS — I10 Essential (primary) hypertension: Secondary | ICD-10-CM | POA: Diagnosis not present

## 2012-04-18 DIAGNOSIS — M1710 Unilateral primary osteoarthritis, unspecified knee: Secondary | ICD-10-CM | POA: Diagnosis not present

## 2012-06-02 DIAGNOSIS — R209 Unspecified disturbances of skin sensation: Secondary | ICD-10-CM | POA: Diagnosis not present

## 2012-06-02 DIAGNOSIS — E119 Type 2 diabetes mellitus without complications: Secondary | ICD-10-CM | POA: Diagnosis not present

## 2012-07-18 DIAGNOSIS — S9030XA Contusion of unspecified foot, initial encounter: Secondary | ICD-10-CM | POA: Diagnosis not present

## 2012-07-24 ENCOUNTER — Other Ambulatory Visit: Payer: Self-pay | Admitting: Orthopedic Surgery

## 2012-07-24 ENCOUNTER — Ambulatory Visit
Admission: RE | Admit: 2012-07-24 | Discharge: 2012-07-24 | Disposition: A | Discharge status: Home / Self Care | Payer: Medicare Other | Source: Ambulatory Visit | Attending: Orthopedic Surgery | Admitting: Orthopedic Surgery

## 2012-07-24 DIAGNOSIS — R609 Edema, unspecified: Secondary | ICD-10-CM

## 2012-07-24 DIAGNOSIS — M25569 Pain in unspecified knee: Secondary | ICD-10-CM | POA: Diagnosis not present

## 2012-07-24 DIAGNOSIS — R52 Pain, unspecified: Secondary | ICD-10-CM

## 2012-08-11 DIAGNOSIS — M1710 Unilateral primary osteoarthritis, unspecified knee: Secondary | ICD-10-CM | POA: Diagnosis not present

## 2012-09-25 DIAGNOSIS — F329 Major depressive disorder, single episode, unspecified: Secondary | ICD-10-CM | POA: Diagnosis not present

## 2012-09-25 DIAGNOSIS — I10 Essential (primary) hypertension: Secondary | ICD-10-CM | POA: Diagnosis not present

## 2012-09-25 DIAGNOSIS — F411 Generalized anxiety disorder: Secondary | ICD-10-CM | POA: Diagnosis not present

## 2012-09-25 DIAGNOSIS — E119 Type 2 diabetes mellitus without complications: Secondary | ICD-10-CM | POA: Diagnosis not present

## 2012-09-25 DIAGNOSIS — E7800 Pure hypercholesterolemia, unspecified: Secondary | ICD-10-CM | POA: Diagnosis not present

## 2012-10-13 DIAGNOSIS — M169 Osteoarthritis of hip, unspecified: Secondary | ICD-10-CM | POA: Diagnosis not present

## 2012-10-20 ENCOUNTER — Other Ambulatory Visit: Payer: Self-pay | Admitting: Orthopedic Surgery

## 2012-10-20 ENCOUNTER — Ambulatory Visit
Admission: RE | Admit: 2012-10-20 | Discharge: 2012-10-20 | Disposition: A | Discharge status: Home / Self Care | Payer: Medicare Other | Source: Ambulatory Visit | Attending: Orthopedic Surgery | Admitting: Orthopedic Surgery

## 2012-10-20 DIAGNOSIS — R52 Pain, unspecified: Secondary | ICD-10-CM

## 2012-10-20 DIAGNOSIS — M169 Osteoarthritis of hip, unspecified: Secondary | ICD-10-CM | POA: Diagnosis not present

## 2012-11-10 DIAGNOSIS — M169 Osteoarthritis of hip, unspecified: Secondary | ICD-10-CM | POA: Diagnosis not present

## 2012-11-20 ENCOUNTER — Encounter (HOSPITAL_COMMUNITY): Payer: Self-pay | Admitting: *Deleted

## 2012-11-20 ENCOUNTER — Emergency Department (HOSPITAL_COMMUNITY)
Admission: EM | Admit: 2012-11-20 | Discharge: 2012-11-20 | Disposition: A | Discharge status: Home / Self Care | Payer: Medicare Other | Attending: Emergency Medicine | Admitting: Emergency Medicine

## 2012-11-20 DIAGNOSIS — E7800 Pure hypercholesterolemia, unspecified: Secondary | ICD-10-CM | POA: Insufficient documentation

## 2012-11-20 DIAGNOSIS — K0889 Other specified disorders of teeth and supporting structures: Secondary | ICD-10-CM

## 2012-11-20 DIAGNOSIS — I10 Essential (primary) hypertension: Secondary | ICD-10-CM | POA: Insufficient documentation

## 2012-11-20 DIAGNOSIS — K089 Disorder of teeth and supporting structures, unspecified: Secondary | ICD-10-CM | POA: Diagnosis not present

## 2012-11-20 DIAGNOSIS — R220 Localized swelling, mass and lump, head: Secondary | ICD-10-CM | POA: Diagnosis not present

## 2012-11-20 DIAGNOSIS — K047 Periapical abscess without sinus: Secondary | ICD-10-CM | POA: Diagnosis not present

## 2012-11-20 DIAGNOSIS — Z79899 Other long term (current) drug therapy: Secondary | ICD-10-CM | POA: Insufficient documentation

## 2012-11-20 DIAGNOSIS — E119 Type 2 diabetes mellitus without complications: Secondary | ICD-10-CM | POA: Insufficient documentation

## 2012-11-20 HISTORY — DX: Type 2 diabetes mellitus without complications: E11.9

## 2012-11-20 HISTORY — DX: Essential (primary) hypertension: I10

## 2012-11-20 HISTORY — DX: Pure hypercholesterolemia, unspecified: E78.00

## 2012-11-20 MED ORDER — OXYCODONE-ACETAMINOPHEN 5-325 MG PO TABS
2.0000 | ORAL_TABLET | Freq: Once | ORAL | Status: AC
  Administered 2012-11-20: 2 via ORAL
  Filled 2012-11-20: qty 2

## 2012-11-20 MED ORDER — CLINDAMYCIN HCL 150 MG PO CAPS: 300.0000 mg | ORAL_CAPSULE | Freq: Three times a day (TID) | ORAL | Status: DC

## 2012-11-20 MED ORDER — HYDROCODONE-ACETAMINOPHEN 5-325 MG PO TABS: 2.0000 | ORAL_TABLET | Freq: Four times a day (QID) | ORAL | Status: DC | PRN

## 2012-11-20 NOTE — ED Provider Notes (Signed)
Medical screening examination/treatment/procedure(s) were performed by non-physician practitioner and as supervising physician I was immediately available for consultation/collaboration.  Ethelda Chick, MD 11/20/12 [phone]

## 2012-11-20 NOTE — ED Provider Notes (Signed)
History   This chart was scribed for non-physician practitioner working with Ethelda Chick, MD by Gerlean Ren, ED Scribe. This patient was seen in room WTR6/WTR6 and the patient's care was started at 1:25 PM.    CSN: [id number]  Arrival date & time 11/20/12  1252   First MD Initiated Contact with Patient 11/20/12 1307      Chief Complaint  Patient presents with  . Dental Pain  . Oral Swelling    The history is provided by the patient. No language interpreter was used.   Beth Hubbard is a 67 y.o. female with h/o DM and HTN who presents to the Emergency Department complaining of 1-2 months of constant, moderate, non-radiating right lower tooth pain with associated swelling that began worsening last night with no associated trauma or injury.  Pt has used hydrocodone for pain with improvements but has not used antibiotics.   Past Medical History  Diagnosis Date  . Diabetes mellitus without complication   . High cholesterol   . Hypertension     Past Surgical History  Procedure Date  . Abdominal surgery   . Rotator cuff repair     bilateral    History reviewed. No pertinent family history.  History  Substance Use Topics  . Smoking status: Not on file  . Smokeless tobacco: Not on file  . Alcohol Use: No    No OB history provided.   Review of Systems  HENT: Positive for dental problem.     Allergies  Review of patient's allergies indicates not on file.  Home Medications  No current outpatient prescriptions on file.  BP 134/76  Pulse 113  Temp 99.6 F (37.6 C)  Resp 22  Ht 5' (1.524 m)  Wt 160 lb (72.576 kg)  BMI 31.25 kg/m2  SpO2 96%  Physical Exam  Nursing note and vitals reviewed. Constitutional: She is oriented to person, place, and time. She appears well-developed and well-nourished. No distress.  HENT:  Head: Normocephalic and atraumatic.  Mouth/Throat:         Diagrammed teeth have been filed, no signs of gingival abscess but significant  swelling to right lower lip and cheek concerning for dental abscess, no tonsillar or peritonsillar abscesses, uvula midline.  Eyes: EOM are normal.  Neck: Neck supple. No tracheal deviation present.  Cardiovascular: Normal rate.   Pulmonary/Chest: Effort normal. No respiratory distress.  Musculoskeletal: Normal range of motion.  Neurological: She is alert and oriented to person, place, and time.  Skin: Skin is warm and dry.  Psychiatric: She has a normal mood and affect. Her behavior is normal.    ED Course  Procedures (including critical care time) DIAGNOSTIC STUDIES: Oxygen Saturation is 96% on room air, adequate by my interpretation.    COORDINATION OF CARE: 1:30 PM- Patient informed of clinical course, understands medical decision-making process, and agrees with plan.  Patient given Percocet for pain. 2:21 PM- HR 104 stable for discharge.  Labs Reviewed - No data to display No results found.   1. Dental abscess   2. Pain, dental       MDM  67 year old female with dental pain and dental abscess.  Will treat the patient with norco and clindamycin.  Return precautions have been given.  The patient is stable and ready for discharge.  I personally performed the services described in this documentation, which was scribed in my presence. The recorded information has been reviewed and is accurate.  Roxy Horseman, PA-C 11/20/12 1444

## 2012-11-20 NOTE — ED Notes (Signed)
Pt c/o pain to right lower tooth pain and swelling to right side of face noted. Reports swelling started on yesterday. Reports pain started yesterday as well.

## 2012-12-03 DIAGNOSIS — H04129 Dry eye syndrome of unspecified lacrimal gland: Secondary | ICD-10-CM | POA: Diagnosis not present

## 2012-12-03 DIAGNOSIS — H26499 Other secondary cataract, unspecified eye: Secondary | ICD-10-CM | POA: Diagnosis not present

## 2012-12-22 DIAGNOSIS — M5137 Other intervertebral disc degeneration, lumbosacral region: Secondary | ICD-10-CM | POA: Diagnosis not present

## 2012-12-30 DIAGNOSIS — H00029 Hordeolum internum unspecified eye, unspecified eyelid: Secondary | ICD-10-CM | POA: Diagnosis not present

## 2013-02-23 DIAGNOSIS — M5430 Sciatica, unspecified side: Secondary | ICD-10-CM | POA: Diagnosis not present

## 2013-02-27 ENCOUNTER — Other Ambulatory Visit: Payer: Self-pay | Admitting: Orthopedic Surgery

## 2013-02-27 ENCOUNTER — Ambulatory Visit
Admission: RE | Admit: 2013-02-27 | Discharge: 2013-02-27 | Disposition: A | Discharge status: Home / Self Care | Payer: Medicare Other | Source: Ambulatory Visit | Attending: Orthopedic Surgery | Admitting: Orthopedic Surgery

## 2013-02-27 DIAGNOSIS — M545 Low back pain: Secondary | ICD-10-CM

## 2013-02-27 DIAGNOSIS — M47817 Spondylosis without myelopathy or radiculopathy, lumbosacral region: Secondary | ICD-10-CM | POA: Diagnosis not present

## 2013-03-03 DIAGNOSIS — H26499 Other secondary cataract, unspecified eye: Secondary | ICD-10-CM | POA: Diagnosis not present

## 2013-03-03 DIAGNOSIS — H04129 Dry eye syndrome of unspecified lacrimal gland: Secondary | ICD-10-CM | POA: Diagnosis not present

## 2013-03-03 DIAGNOSIS — H4011X0 Primary open-angle glaucoma, stage unspecified: Secondary | ICD-10-CM | POA: Diagnosis not present

## 2013-03-13 DIAGNOSIS — R209 Unspecified disturbances of skin sensation: Secondary | ICD-10-CM | POA: Diagnosis not present

## 2013-03-13 DIAGNOSIS — E119 Type 2 diabetes mellitus without complications: Secondary | ICD-10-CM | POA: Diagnosis not present

## 2013-03-13 DIAGNOSIS — I10 Essential (primary) hypertension: Secondary | ICD-10-CM | POA: Diagnosis not present

## 2013-04-27 DIAGNOSIS — M5137 Other intervertebral disc degeneration, lumbosacral region: Secondary | ICD-10-CM | POA: Diagnosis not present

## 2013-05-18 ENCOUNTER — Emergency Department (HOSPITAL_COMMUNITY): Payer: Medicare Other

## 2013-05-18 ENCOUNTER — Emergency Department (HOSPITAL_COMMUNITY)
Admission: EM | Admit: 2013-05-18 | Discharge: 2013-05-18 | Disposition: A | Discharge status: Home / Self Care | Payer: Medicare Other | Attending: Emergency Medicine | Admitting: Emergency Medicine

## 2013-05-18 ENCOUNTER — Encounter (HOSPITAL_COMMUNITY): Payer: Self-pay | Admitting: Emergency Medicine

## 2013-05-18 DIAGNOSIS — G8929 Other chronic pain: Secondary | ICD-10-CM | POA: Diagnosis not present

## 2013-05-18 DIAGNOSIS — Z880 Allergy status to penicillin: Secondary | ICD-10-CM | POA: Diagnosis not present

## 2013-05-18 DIAGNOSIS — E7800 Pure hypercholesterolemia, unspecified: Secondary | ICD-10-CM | POA: Insufficient documentation

## 2013-05-18 DIAGNOSIS — M5137 Other intervertebral disc degeneration, lumbosacral region: Secondary | ICD-10-CM | POA: Diagnosis not present

## 2013-05-18 DIAGNOSIS — Z79899 Other long term (current) drug therapy: Secondary | ICD-10-CM | POA: Insufficient documentation

## 2013-05-18 DIAGNOSIS — E119 Type 2 diabetes mellitus without complications: Secondary | ICD-10-CM | POA: Insufficient documentation

## 2013-05-18 DIAGNOSIS — IMO0002 Reserved for concepts with insufficient information to code with codable children: Secondary | ICD-10-CM | POA: Diagnosis not present

## 2013-05-18 DIAGNOSIS — M5416 Radiculopathy, lumbar region: Secondary | ICD-10-CM

## 2013-05-18 DIAGNOSIS — R209 Unspecified disturbances of skin sensation: Secondary | ICD-10-CM | POA: Insufficient documentation

## 2013-05-18 MED ORDER — OXYCODONE-ACETAMINOPHEN 5-325 MG PO TABS: 1.0000 | ORAL_TABLET | ORAL | Status: DC | PRN

## 2013-05-18 MED ORDER — PREDNISONE 20 MG PO TABS: 40.0000 mg | ORAL_TABLET | Freq: Every day | ORAL | Status: DC

## 2013-05-18 MED ORDER — MORPHINE SULFATE 4 MG/ML IJ SOLN
8.0000 mg | Freq: Once | INTRAMUSCULAR | Status: AC
  Administered 2013-05-18: 8 mg via INTRAMUSCULAR
  Filled 2013-05-18: qty 2

## 2013-05-18 MED ORDER — METHOCARBAMOL 500 MG PO TABS
500.0000 mg | ORAL_TABLET | Freq: Once | ORAL | Status: AC
  Administered 2013-05-18: 500 mg via ORAL
  Filled 2013-05-18: qty 1
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
## 2013-05-18 NOTE — ED Notes (Signed)
Patient transported to CT 
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
## 2013-05-20 DIAGNOSIS — M5126 Other intervertebral disc displacement, lumbar region: Secondary | ICD-10-CM | POA: Diagnosis not present

## 2013-05-29 DIAGNOSIS — M5126 Other intervertebral disc displacement, lumbar region: Secondary | ICD-10-CM | POA: Diagnosis not present

## 2013-06-29 DIAGNOSIS — M5126 Other intervertebral disc displacement, lumbar region: Secondary | ICD-10-CM | POA: Diagnosis not present

## 2013-07-27 DIAGNOSIS — M65839 Other synovitis and tenosynovitis, unspecified forearm: Secondary | ICD-10-CM | POA: Diagnosis not present

## 2013-08-15 DIAGNOSIS — E119 Type 2 diabetes mellitus without complications: Secondary | ICD-10-CM | POA: Diagnosis not present

## 2013-08-15 DIAGNOSIS — E7800 Pure hypercholesterolemia, unspecified: Secondary | ICD-10-CM | POA: Diagnosis not present

## 2013-08-15 DIAGNOSIS — E1110 Type 2 diabetes mellitus with ketoacidosis without coma: Secondary | ICD-10-CM | POA: Diagnosis not present

## 2013-08-15 DIAGNOSIS — Z23 Encounter for immunization: Secondary | ICD-10-CM | POA: Diagnosis not present

## 2013-08-15 DIAGNOSIS — G4700 Insomnia, unspecified: Secondary | ICD-10-CM | POA: Diagnosis not present

## 2013-08-15 DIAGNOSIS — I10 Essential (primary) hypertension: Secondary | ICD-10-CM | POA: Diagnosis not present

## 2013-09-04 DIAGNOSIS — M6740 Ganglion, unspecified site: Secondary | ICD-10-CM | POA: Diagnosis not present

## 2013-09-16 DIAGNOSIS — H4011X0 Primary open-angle glaucoma, stage unspecified: Secondary | ICD-10-CM | POA: Diagnosis not present

## 2013-09-22 DIAGNOSIS — M75100 Unspecified rotator cuff tear or rupture of unspecified shoulder, not specified as traumatic: Secondary | ICD-10-CM | POA: Diagnosis not present

## 2013-09-22 DIAGNOSIS — M25519 Pain in unspecified shoulder: Secondary | ICD-10-CM | POA: Diagnosis not present

## 2013-10-12 DIAGNOSIS — M67919 Unspecified disorder of synovium and tendon, unspecified shoulder: Secondary | ICD-10-CM | POA: Diagnosis not present

## 2014-01-02 DIAGNOSIS — E119 Type 2 diabetes mellitus without complications: Secondary | ICD-10-CM | POA: Diagnosis not present

## 2014-01-02 DIAGNOSIS — K219 Gastro-esophageal reflux disease without esophagitis: Secondary | ICD-10-CM | POA: Diagnosis not present

## 2014-01-08 DIAGNOSIS — M5430 Sciatica, unspecified side: Secondary | ICD-10-CM | POA: Diagnosis not present

## 2014-02-01 DIAGNOSIS — E119 Type 2 diabetes mellitus without complications: Secondary | ICD-10-CM | POA: Diagnosis not present

## 2014-02-26 ENCOUNTER — Other Ambulatory Visit: Payer: Self-pay | Admitting: Orthopedic Surgery

## 2014-02-26 DIAGNOSIS — R52 Pain, unspecified: Secondary | ICD-10-CM

## 2014-02-26 DIAGNOSIS — M25539 Pain in unspecified wrist: Secondary | ICD-10-CM | POA: Diagnosis not present

## 2014-03-05 ENCOUNTER — Ambulatory Visit
Admission: RE | Admit: 2014-03-05 | Discharge: 2014-03-05 | Disposition: A | Discharge status: Home / Self Care | Payer: Medicare Other | Source: Ambulatory Visit | Attending: Orthopedic Surgery | Admitting: Orthopedic Surgery

## 2014-03-05 DIAGNOSIS — R29898 Other symptoms and signs involving the musculoskeletal system: Secondary | ICD-10-CM | POA: Diagnosis not present

## 2014-03-05 DIAGNOSIS — R52 Pain, unspecified: Secondary | ICD-10-CM

## 2014-03-05 DIAGNOSIS — M79609 Pain in unspecified limb: Secondary | ICD-10-CM | POA: Diagnosis not present

## 2014-03-05 MED ORDER — GADOBENATE DIMEGLUMINE 529 MG/ML IV SOLN
13.0000 mL | Freq: Once | INTRAVENOUS | Status: AC | PRN
  Administered 2014-03-05: 13 mL via INTRAVENOUS

## 2014-04-09 DIAGNOSIS — M24029 Loose body in unspecified elbow: Secondary | ICD-10-CM | POA: Diagnosis not present

## 2014-04-21 DIAGNOSIS — H01009 Unspecified blepharitis unspecified eye, unspecified eyelid: Secondary | ICD-10-CM | POA: Diagnosis not present

## 2014-04-21 DIAGNOSIS — H109 Unspecified conjunctivitis: Secondary | ICD-10-CM | POA: Diagnosis not present

## 2014-04-22 ENCOUNTER — Emergency Department (HOSPITAL_COMMUNITY)
Admission: EM | Admit: 2014-04-22 | Discharge: 2014-04-22 | Disposition: A | Discharge status: Home / Self Care | Payer: Medicare Other | Attending: Emergency Medicine | Admitting: Emergency Medicine

## 2014-04-22 ENCOUNTER — Encounter (HOSPITAL_COMMUNITY): Payer: Self-pay | Admitting: Emergency Medicine

## 2014-04-22 DIAGNOSIS — Z79899 Other long term (current) drug therapy: Secondary | ICD-10-CM | POA: Insufficient documentation

## 2014-04-22 DIAGNOSIS — I10 Essential (primary) hypertension: Secondary | ICD-10-CM | POA: Insufficient documentation

## 2014-04-22 DIAGNOSIS — E7800 Pure hypercholesterolemia, unspecified: Secondary | ICD-10-CM | POA: Insufficient documentation

## 2014-04-22 DIAGNOSIS — Z880 Allergy status to penicillin: Secondary | ICD-10-CM | POA: Insufficient documentation

## 2014-04-22 DIAGNOSIS — B029 Zoster without complications: Secondary | ICD-10-CM | POA: Diagnosis not present

## 2014-04-22 DIAGNOSIS — E119 Type 2 diabetes mellitus without complications: Secondary | ICD-10-CM | POA: Insufficient documentation

## 2014-04-22 DIAGNOSIS — Z792 Long term (current) use of antibiotics: Secondary | ICD-10-CM | POA: Diagnosis not present

## 2014-04-22 DIAGNOSIS — E876 Hypokalemia: Secondary | ICD-10-CM | POA: Insufficient documentation

## 2014-04-22 LAB — CBC WITH DIFFERENTIAL/PLATELET
BASOS ABS: 0 10*3/uL (ref 0.0–0.1)
BASOS PCT: 0 % (ref 0–1)
EOS PCT: 0 % (ref 0–5)
Eosinophils Absolute: 0 10*3/uL (ref 0.0–0.7)
HEMATOCRIT: 39.5 % (ref 36.0–46.0)
Hemoglobin: 13.1 g/dL (ref 12.0–15.0)
Lymphocytes Relative: 18 % (ref 12–46)
Lymphs Abs: 1.2 10*3/uL (ref 0.7–4.0)
MCH: 31.3 pg (ref 26.0–34.0)
MCHC: 33.2 g/dL (ref 30.0–36.0)
MCV: 94.3 fL (ref 78.0–100.0)
MONO ABS: 0.6 10*3/uL (ref 0.1–1.0)
Monocytes Relative: 9 % (ref 3–12)
NEUTROS ABS: 4.9 10*3/uL (ref 1.7–7.7)
Neutrophils Relative %: 73 % (ref 43–77)
Platelets: 269 10*3/uL (ref 150–400)
RBC: 4.19 MIL/uL (ref 3.87–5.11)
RDW: 14.1 % (ref 11.5–15.5)
WBC: 6.8 10*3/uL (ref 4.0–10.5)

## 2014-04-22 LAB — BASIC METABOLIC PANEL
BUN: 10 mg/dL (ref 6–23)
CALCIUM: 10.8 mg/dL — AB (ref 8.4–10.5)
CHLORIDE: 97 meq/L (ref 96–112)
CO2: 28 mEq/L (ref 19–32)
CREATININE: 0.78 mg/dL (ref 0.50–1.10)
GFR calc non Af Amer: 84 mL/min — ABNORMAL LOW (ref >90)
Glucose, Bld: 115 mg/dL — ABNORMAL HIGH (ref 70–99)
Potassium: 3.2 mEq/L — ABNORMAL LOW (ref 3.7–5.3)
Sodium: 141 mEq/L (ref 137–147)

## 2014-04-22 LAB — HEPATIC FUNCTION PANEL
ALT: 14 U/L (ref 0–35)
AST: 18 U/L (ref 0–37)
Albumin: 4.3 g/dL (ref 3.5–5.2)
Alkaline Phosphatase: 56 U/L (ref 39–117)
Bilirubin, Direct: <0.2 mg/dL (ref 0.0–0.3)
TOTAL PROTEIN: 8.2 g/dL (ref 6.0–8.3)
Total Bilirubin: 0.3 mg/dL (ref 0.3–1.2)

## 2014-04-22 LAB — CBG MONITORING, ED: GLUCOSE-CAPILLARY: 100 mg/dL — AB (ref 70–99)

## 2014-04-22 MED ORDER — OXYCODONE-ACETAMINOPHEN 5-325 MG PO TABS: ORAL_TABLET | ORAL | Status: DC

## 2014-04-22 MED ORDER — ACETAMINOPHEN 325 MG PO TABS
650.0000 mg | ORAL_TABLET | Freq: Once | ORAL | Status: AC
  Administered 2014-04-22: 650 mg via ORAL
  Filled 2014-04-22: qty 2

## 2014-04-22 MED ORDER — FLUORESCEIN SODIUM 1 MG OP STRP
2.0000 | ORAL_STRIP | Freq: Once | OPHTHALMIC | Status: AC
  Administered 2014-04-22: 2 via OPHTHALMIC
  Filled 2014-04-22: qty 2

## 2014-04-22 MED ORDER — PROPARACAINE HCL 0.5 % OP SOLN
1.0000 [drp] | Freq: Once | OPHTHALMIC | Status: AC
  Administered 2014-04-22: 2 [drp] via OPHTHALMIC
  Filled 2014-04-22: qty 15

## 2014-04-22 MED ORDER — SODIUM CHLORIDE 0.9 % IV SOLN
Freq: Once | INTRAVENOUS | Status: AC
  Administered 2014-04-22: 100 mL via INTRAVENOUS

## 2014-04-22 MED ORDER — VALACYCLOVIR HCL 1 G PO TABS: 1000.0000 mg | ORAL_TABLET | Freq: Three times a day (TID) | ORAL | Status: DC

## 2014-04-22 MED ORDER — VALACYCLOVIR HCL 500 MG PO TABS
1000.0000 mg | ORAL_TABLET | Freq: Three times a day (TID) | ORAL | Status: DC
  Administered 2014-04-22: 1000 mg via ORAL
  Filled 2014-04-22: qty 2

## 2014-04-22 MED ORDER — ERYTHROMYCIN 5 MG/GM OP OINT
TOPICAL_OINTMENT | Freq: Four times a day (QID) | OPHTHALMIC | Status: DC
  Administered 2014-04-22: 20:00:00 via OPHTHALMIC
  Filled 2014-04-22: qty 3.5

## 2014-04-22 MED ORDER — LORAZEPAM 2 MG/ML IJ SOLN
1.0000 mg | Freq: Once | INTRAMUSCULAR | Status: AC
  Administered 2014-04-22: 1 mg via INTRAVENOUS
  Filled 2014-04-22: qty 1

## 2014-04-22 MED ORDER — ERYTHROMYCIN 2 % EX OINT: TOPICAL_OINTMENT | CUTANEOUS | Status: DC

## 2014-04-22 MED ORDER — POTASSIUM CHLORIDE CRYS ER 20 MEQ PO TBCR: 20.0000 meq | EXTENDED_RELEASE_TABLET | Freq: Every day | ORAL | Status: DC

## 2014-04-22 MED ORDER — FLUORESCEIN SODIUM 1 MG OP STRP
1.0000 | ORAL_STRIP | Freq: Once | OPHTHALMIC | Status: AC
  Filled 2014-04-22: qty 1

## 2014-04-22 MED ORDER — POTASSIUM CHLORIDE CRYS ER 20 MEQ PO TBCR
40.0000 meq | EXTENDED_RELEASE_TABLET | Freq: Once | ORAL | Status: AC
  Administered 2014-04-22: 40 meq via ORAL
  Filled 2014-04-22: qty 2

## 2014-04-22 MED ORDER — MORPHINE SULFATE 4 MG/ML IJ SOLN
4.0000 mg | INTRAMUSCULAR | Status: DC | PRN
  Administered 2014-04-22 (×2): 4 mg via INTRAVENOUS
  Filled 2014-04-22 (×2): qty 1

## 2014-04-22 NOTE — ED Notes (Signed)
Eye doctor @ bedside performing exam in the dark. Will attempt lab draw when exam is done.

## 2014-04-22 NOTE — Consult Note (Signed)
Beth Hubbard                                                                               04/22/2014                                               Pediatric Ophthalmology Consultation                                         Consult requested by: Dirk Dress ED PA  Reason for consultation:  Shingles  HPI: 68yo female with tingling sensation over left forehead starting three days ago; rash broke out yesterday, pt taken to urgent care. Pt told it was "an infection" and given doxycycline and gentamycin q4h. Now in [facility] with rash over V1 distribution and significant pain. Difficulty opening eyes because she hasn't had sleep in three days. Poor cooperation with examination.  Pertinent Medical History:   Active Ambulatory Problems    Diagnosis Date Noted  . No Active Ambulatory Problems   Resolved Ambulatory Problems    Diagnosis Date Noted  . No Resolved Ambulatory Problems   Past Medical History  Diagnosis Date  . Diabetes mellitus without complication   . High cholesterol   . Hypertension      Pertinent Ophthalmic History: CE OU at [facility]; last eye exam ~1-23yrs ago; no return appt given at that time  Current Eye Medications: gentamicin QID OS  Systemic medications on admission:   (Not in a hospital admission)     ROS: severe pain over L forehead; "VERY SLEEPY!!"  Visual Fields: UTA   Pupils:  Equal, brisk, no APD  Near acuity:   UTA - pt minimally cooperative due to pain and sleep deprivation  TA:       OD: NL to palpation OS: 21, 22 mmhg  by Tonopen  Dilation:  Not dilated  External:   OD:  Normal      OS:  Vesicles on forehead; none on eyelid  Anterior segment exam:  By slit lamp  Conjunctiva:  OD:  Quiet     OS:  Tr injection  Cornea:    OD: Clear, no fluorescein stain; arcus    OS: Clear, no fluorescein stain; arcus  Anterior Chamber:   OD:  Deep/quiet     OS:  Deep/quiet    Iris:    OD:  Normal      OS:  Normal     Lens:    OD:  PCIOL      OS:  PCIOL  Impression:   68yo female with shingles in V1 distribution, not affecting eye.  Recommendations/Plan: 1. Discontinue gentamicin gtts 2. Start e-mycin ung to periorbital wounds, may use on eyelid and eye for comfort if vesicles develop 3. Needs to be followed by generalist closely over next few weeks for potential sequelae/eye involvement - recommend calling [facility] tomorrow for exam, can see me at my office if CE unable  to work in for exam and to establish care.  I've discussed these findings with the PA. Please contact our office with any questions or concerns at [phone]. Thank you for calling us to care for this kind woman.  Lovie Chol
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
## 2014-04-22 NOTE — Discharge Instructions (Signed)
Follow with [facility] for a checkup tomorrow. If you're not able to get in at this location please call Dr. Posey Pronto for an appointment.   You need to be seen in the next 24 hours for recheck.  Take percocet for breakthrough pain, do not drink alcohol, drive, care for children or do other critical tasks while taking percocet.  Take your antibvirals as directed and to completion. You should never have any leftover antivirals! Push fluids and stay well hydrated.   Please follow with your primary care doctor in the next 2 days for a check-up. They must obtain records for further management.   Do not hesitate to return to the Emergency Department for any new, worsening or concerning symptoms.       Shingles Shingles is caused by the same virus that causes chickenpox. The first feelings may be pain or tingling. A rash will follow in a couple days. The rash may occur on any area of the body. Long-lasting pain is more likely in an elderly person. It can last months to years. There are medicines that can help prevent pain if you start taking them early. HOME CARE   Place cool cloths on the rash.  Only take medicine as told by your doctor.  You may use calamine lotion to relieve itchy skin.  Avoid touching:  Babies.  Children with inflamed skin (eczema).  People who have gotten transplanted organs.  People with chronic illnesses, such as leukemia and AIDS.  If the rash is on the face, you may need to see a specialist. Keep all appointments. Shingles must be kept away from the eyes, if possible.  Keep all appointments.  Avoid touching the eyes or eye area, if possible. GET HELP RIGHT AWAY IF:   You have any pain on the face or eye.  Your medicines do not help.  Your redness or puffiness (swelling) spreads.  You have a fever.  You notice any red lines going away from the rash area. MAKE SURE YOU:   Understand these instructions.  Will watch your condition.  Will get  help right away if you are not doing well or get worse. Document Released: 04/23/2008 Document Revised: 01/28/2012 Document Reviewed: 04/23/2008 [facility] Patient Information 2014 [place], [place].

## 2014-04-22 NOTE — ED Notes (Signed)
MD at bedside. 

## 2014-04-22 NOTE — ED Notes (Signed)
Patient was unable to open her left eye.

## 2014-04-22 NOTE — ED Provider Notes (Signed)
CSN: [id number]     Arrival date & time 04/22/14  1547 History   First MD Initiated Contact with Patient 04/22/14 1607     Chief Complaint  Patient presents with  . Eye Pain     (Consider location/radiation/quality/duration/timing/severity/associated sxs/prior Treatment) HPI  Beth Hubbard is a 68 y.o. female with past medical history significant for non-insulin-dependent diabetes, high cholesterol and hypertension complaining of worsening pain swelling and rash to left thigh onset 3 days ago. Patient states on Sunday she was having her eyebrow rouse clot she felt a sharp pain in the foot complexion power on IN for head the left eye to help with the discomfort. Swelling has significantly worsened over the course of the last 24 hours. She denies decreased visual acuity. States she was seen in urgent care center and they started her on oral doxycycline and gentamicin ophthalmic drops. Patient does not wear contact lens, follows with [facility] ophthalmology for glaucoma. Patient states that she does not know if she had chickenpox as a child. Pain is severe and exacerbated by movement and palpation.   Past Medical History  Diagnosis Date  . Diabetes mellitus without complication   . High cholesterol   . Hypertension    Past Surgical History  Procedure Laterality Date  . Abdominal surgery    . Rotator cuff repair      bilateral   No family history on file. History  Substance Use Topics  . Smoking status: Not on file  . Smokeless tobacco: Not on file  . Alcohol Use: No   OB History   Grav Para Term Preterm Abortions TAB SAB Ect Mult Living                 Review of Systems  10 systems reviewed and found to be negative, except as noted in the HPI.  Allergies  Penicillins; Ibuprofen; and Nsaids  Home Medications   Prior to Admission medications   Medication Sig Start Date End Date Taking? Authorizing Provider  amLODipine (NORVASC) 5 MG tablet Take 5 mg by mouth daily.    Yes Historical Provider, MD  doxycycline (MONODOX) 100 MG capsule Take 100 mg by mouth 2 (two) times daily.   Yes Historical Provider, MD  gentamicin (GARAMYCIN) 0.3 % ophthalmic solution Place 1 drop into the left eye every 4 (four) hours.   Yes Historical Provider, MD  rosuvastatin (CRESTOR) 10 MG tablet Take 10 mg by mouth daily.   Yes Historical Provider, MD  Erythromycin 2 % ointment Apply to affected area 2 times daily, do not apply to the inside of the eye eye lid or eyeball 04/22/14   Elmyra Ricks Vallie Teters, PA-C  oxyCODONE-acetaminophen (PERCOCET/ROXICET) 5-325 MG per tablet 1 to 2 tabs PO q6hrs  PRN for pain 04/22/14   Elmyra Ricks Martavious Hartel, PA-C  potassium chloride SA (K-DUR,KLOR-CON) 20 MEQ tablet Take 1 tablet (20 mEq total) by mouth daily. 04/22/14   Audrianna Driskill, PA-C  valACYclovir (VALTREX) 1000 MG tablet Take 1 tablet (1,000 mg total) by mouth 3 (three) times daily. 04/22/14   Oluchi Pucci, PA-C   BP 175/63  Pulse 84  Temp(Src) 100.6 F (38.1 C) (Oral)  Resp 20  SpO2 97% Physical Exam  Constitutional:  Mild distress  HENT:  Head: Atraumatic. Not macrocephalic.    Mouth/Throat: Oropharynx is clear and moist.  Vesicular rash to left forehead, moderate to severe eyelid swelling on the left side. + Chemosis.  Extraocular movement intact. PERRL. no uptake or dendrites seen on fluorescein stain  with Woods lamp.  Eyes: Pupils are equal, round, and reactive to light. Left eye exhibits no discharge.  Cardiovascular: Normal rate and regular rhythm.   Pulmonary/Chest: Effort normal and breath sounds normal.  Abdominal: Soft. She exhibits no distension and no mass. There is no tenderness. There is no rebound and no guarding.  Skin: Rash noted.          ED Course  Procedures (including critical care time) Labs Review Labs Reviewed  BASIC METABOLIC PANEL - Abnormal; Notable for the following:    Potassium 3.2 (*)    Glucose, Bld 115 (*)    Calcium 10.8 (*)    GFR calc non Af Amer  84 (*)    All other components within normal limits  CBG MONITORING, ED - Abnormal; Notable for the following:    Glucose-Capillary 100 (*)    All other components within normal limits  CULTURE, BLOOD (ROUTINE X 2)  CULTURE, BLOOD (ROUTINE X 2)  CBC WITH DIFFERENTIAL  HEPATIC FUNCTION PANEL    Imaging Review No results found.   EKG Interpretation None      MDM   Final diagnoses:  Herpes zoster  Hypokalemia    Filed Vitals:   04/22/14 1548 04/22/14 1732 04/22/14 1902  BP: 121/93 180/67 175/63  Pulse: 99 70 84  Temp: 100.6 F (38.1 C)    TempSrc: Oral    Resp: 22 20 20   SpO2: 100% 99% 97%    Medications  fluorescein ophthalmic strip 2 strip (not administered)  morphine 4 MG/ML injection 4 mg (4 mg Intravenous Given 04/22/14 1645)  fluorescein ophthalmic strip 1 strip (not administered)  erythromycin ophthalmic ointment (not administered)  valACYclovir (VALTREX) tablet 1,000 mg (not administered)  potassium chloride SA (K-DUR,KLOR-CON) CR tablet 40 mEq (not administered)  proparacaine (ALCAINE) 0.5 % ophthalmic solution 1 drop (2 drops Both Eyes Given by Other 04/22/14 1648)  0.9 %  sodium chloride infusion (100 mLs Intravenous New Bag/Given 04/22/14 1649)  acetaminophen (TYLENOL) tablet 650 mg (650 mg Oral Given 04/22/14 1654)  LORazepam (ATIVAN) injection 1 mg (1 mg Intravenous Given 04/22/14 1735)    Beth Hubbard is a 68 y.o. female presenting with vesicular rash to left forehead, left upper eyelid swelling and severe pain. 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   Ophthalmology consult from Dr. Posey Pronto appreciated: She has come to evaluate the patient and done a slit lamp exam. She also does not appreciate any dendrites, thinks there is no true ocular involvement to the zoster. She recommends DCing the gentamicin thinks that that may be irritating the eye causing the chemosis. Recommended  topical erythromycin to the skin lesions. She recommends following for a recheck in  24 hours. Return precautions discussed with the patient and her daughter. Verbalized understanding. Patient has mild hypokalemia was treated orally.  Evaluation does not show pathology that would require ongoing emergent intervention or inpatient treatment. Pt is hemodynamically stable and mentating appropriately. Discussed findings and plan with patient/guardian, who agrees with care plan. All questions answered. Return precautions discussed and outpatient follow up given.   New Prescriptions   ERYTHROMYCIN 2 % OINTMENT    Apply to affected area 2 times daily, do not apply to the inside of the eye eye lid or eyeball   OXYCODONE-ACETAMINOPHEN (PERCOCET/ROXICET) 5-325 MG PER TABLET    1 to 2 tabs PO q6hrs  PRN for pain   POTASSIUM CHLORIDE SA (K-DUR,KLOR-CON) 20  MEQ TABLET    Take 1 tablet (20 mEq total) by mouth daily.   VALACYCLOVIR (VALTREX) 1000 MG TABLET    Take 1 tablet (1,000 mg total) by mouth 3 (three) times daily.         Monico Blitz, PA-C 04/22/14 1955

## 2014-04-23 DIAGNOSIS — B0233 Zoster keratitis: Secondary | ICD-10-CM | POA: Diagnosis not present

## 2014-04-23 NOTE — ED Provider Notes (Signed)
Medical screening examination/treatment/procedure(s) were conducted as a shared visit with non-physician practitioner(s) and myself.  I personally evaluated the patient during the encounter.   EKG Interpretation None      I interviewed and examined the patient. Lungs are CTAB. Cardiac exam wnl. Abdomen soft.  Vesicular painful rash to left forehead. Mod swelling of left eye lid and periorbital area. Optho consulted who evaluted the pt. Will provide Rx's and have pt f/u w/ optho.   Blanchard Kelch, MD 04/23/14 1515

## 2014-04-26 DIAGNOSIS — B029 Zoster without complications: Secondary | ICD-10-CM | POA: Diagnosis not present

## 2014-04-26 DIAGNOSIS — H4011X0 Primary open-angle glaucoma, stage unspecified: Secondary | ICD-10-CM | POA: Diagnosis not present

## 2014-04-26 DIAGNOSIS — E119 Type 2 diabetes mellitus without complications: Secondary | ICD-10-CM | POA: Diagnosis not present

## 2014-04-26 DIAGNOSIS — H26499 Other secondary cataract, unspecified eye: Secondary | ICD-10-CM | POA: Diagnosis not present

## 2014-04-28 LAB — CULTURE, BLOOD (ROUTINE X 2)
CULTURE: NO GROWTH
Culture: NO GROWTH

## 2014-05-03 DIAGNOSIS — H4011X0 Primary open-angle glaucoma, stage unspecified: Secondary | ICD-10-CM | POA: Diagnosis not present

## 2014-05-03 DIAGNOSIS — H26499 Other secondary cataract, unspecified eye: Secondary | ICD-10-CM | POA: Diagnosis not present

## 2014-05-03 DIAGNOSIS — B029 Zoster without complications: Secondary | ICD-10-CM | POA: Diagnosis not present

## 2014-05-03 DIAGNOSIS — E119 Type 2 diabetes mellitus without complications: Secondary | ICD-10-CM | POA: Diagnosis not present

## 2014-05-06 ENCOUNTER — Encounter (HOSPITAL_COMMUNITY): Payer: Self-pay | Admitting: Emergency Medicine

## 2014-05-06 ENCOUNTER — Emergency Department (HOSPITAL_COMMUNITY)
Admission: EM | Admit: 2014-05-06 | Discharge: 2014-05-06 | Disposition: A | Discharge status: Home / Self Care | Payer: Medicare Other | Attending: Emergency Medicine | Admitting: Emergency Medicine

## 2014-05-06 DIAGNOSIS — B029 Zoster without complications: Secondary | ICD-10-CM | POA: Insufficient documentation

## 2014-05-06 DIAGNOSIS — I10 Essential (primary) hypertension: Secondary | ICD-10-CM | POA: Insufficient documentation

## 2014-05-06 DIAGNOSIS — E7800 Pure hypercholesterolemia, unspecified: Secondary | ICD-10-CM | POA: Insufficient documentation

## 2014-05-06 DIAGNOSIS — Z880 Allergy status to penicillin: Secondary | ICD-10-CM | POA: Insufficient documentation

## 2014-05-06 DIAGNOSIS — Z79899 Other long term (current) drug therapy: Secondary | ICD-10-CM | POA: Insufficient documentation

## 2014-05-06 DIAGNOSIS — L299 Pruritus, unspecified: Secondary | ICD-10-CM | POA: Diagnosis present

## 2014-05-06 DIAGNOSIS — E119 Type 2 diabetes mellitus without complications: Secondary | ICD-10-CM | POA: Insufficient documentation

## 2014-05-06 MED ORDER — LORAZEPAM 1 MG PO TABS
1.0000 mg | ORAL_TABLET | Freq: Once | ORAL | Status: AC
  Administered 2014-05-06: 1 mg via ORAL
  Filled 2014-05-06: qty 1

## 2014-05-06 MED ORDER — PREDNISONE 20 MG PO TABS
60.0000 mg | ORAL_TABLET | Freq: Once | ORAL | Status: AC
  Administered 2014-05-06: 60 mg via ORAL
  Filled 2014-05-06: qty 3

## 2014-05-06 MED ORDER — LORAZEPAM 1 MG PO TABS: 1.0000 mg | ORAL_TABLET | Freq: Three times a day (TID) | ORAL | Status: DC | PRN

## 2014-05-06 MED ORDER — OXYCODONE-ACETAMINOPHEN 5-325 MG PO TABS: 1.0000 | ORAL_TABLET | ORAL | Status: DC | PRN

## 2014-05-06 MED ORDER — PREDNISONE 20 MG PO TABS: 40.0000 mg | ORAL_TABLET | Freq: Every day | ORAL | Status: DC

## 2014-05-06 MED ORDER — OXYCODONE-ACETAMINOPHEN 5-325 MG PO TABS
2.0000 | ORAL_TABLET | Freq: Once | ORAL | Status: AC
  Administered 2014-05-06: 2 via ORAL
  Filled 2014-05-06: qty 2

## 2014-05-06 NOTE — ED Notes (Signed)
Pt c/o itching and burning to lt side of head.  States that it feels like something is running down in her hair.  No bugs or rash noted to head.  Sx x 3 days.

## 2014-05-06 NOTE — Discharge Instructions (Signed)
Neuropathic Pain We often think that pain has a physical cause. If we get rid of the cause, the pain should go away. Nerves themselves can also cause pain. It is called neuropathic pain, which means nerve abnormality. It may be difficult for the patients who have it and for the treating caregivers. Pain is usually described as acute (short-lived) or chronic (long-lasting). Acute pain is related to the physical sensations caused by an injury. It can last from a few seconds to many weeks, but it usually goes away when normal healing occurs. Chronic pain lasts beyond the typical healing time. With neuropathic pain, the nerve fibers themselves may be damaged or injured. They then send incorrect signals to other pain centers. The pain you feel is real, but the cause is not easy to find.  CAUSES  Chronic pain can result from diseases, such as diabetes and shingles (an infection related to chickenpox), or from trauma, surgery, or amputation. It can also happen without any known injury or disease. The nerves are sending pain messages, even though there is no identifiable cause for such messages.   Other common causes of neuropathy include diabetes, phantom limb pain, or Regional Pain Syndrome (RPS).  As with all forms of chronic back pain, if neuropathy is not correctly treated, there can be a number of associated problems that lead to a downward cycle for the patient. These include depression, sleeplessness, feelings of fear and anxiety, limited social interaction and inability to do normal daily activities or work.  The most dramatic and mysterious example of neuropathic pain is called "phantom limb syndrome." This occurs when an arm or a leg has been removed because of illness or injury. The brain still gets pain messages from the nerves that originally carried impulses from the missing limb. These nerves now seem to misfire and cause troubling pain.  Neuropathic pain often seems to have no cause. It responds  poorly to standard pain treatment. Neuropathic pain can occur after:  Shingles (herpes zoster virus infection).  A lasting burning sensation of the skin, caused usually by injury to a peripheral nerve.  Peripheral neuropathy which is widespread nerve damage, often caused by diabetes or alcoholism.  Phantom limb pain following an amputation.  Facial nerve problems (trigeminal neuralgia).  Multiple sclerosis.  Reflex sympathetic dystrophy.  Pain which comes with cancer and cancer chemotherapy.  Entrapment neuropathy such as when pressure is put on a nerve such as in carpal tunnel syndrome.  Back, leg, and hip problems (sciatica).  Spine or back surgery.  HIV Infection or AIDS where nerves are infected by viruses. Your caregiver can explain items in the above list which may apply to you. SYMPTOMS  Characteristics of neuropathic pain are:  Severe, sharp, electric shock-like, shooting, lightening-like, knife-like.  Pins and needles sensation.  Deep burning, deep cold, or deep ache.  Persistent numbness, tingling, or weakness.  Pain resulting from light touch or other stimulus that would not usually cause pain.  Increased sensitivity to something that would normally cause pain, such as a pinprick. Pain may persist for months or years following the healing of damaged tissues. When this happens, pain signals no longer sound an alarm about current injuries or injuries about to happen. Instead, the alarm system itself is not working correctly.  Neuropathic pain may get worse instead of better over time. For some people, it can lead to serious disability. It is important to be aware that severe injury in a limb can occur without a proper, protective pain  response.Burns, cuts, and other injuries may go unnoticed. Without proper treatment, these injuries can become infected or lead to further disability. Take any injury seriously, and consult your caregiver for treatment. DIAGNOSIS    When you have a pain with no known cause, your caregiver will probably ask some specific questions:   Do you have any other conditions, such as diabetes, shingles, multiple sclerosis, or HIV infection?  How would you describe your pain? (Neuropathic pain is often described as shooting, stabbing, burning, or searing.)  Is your pain worse at any time of the day? (Neuropathic pain is usually worse at night.)  Does the pain seem to follow a certain physical pathway?  Does the pain come from an area that has missing or injured nerves? (An example would be phantom limb pain.)  Is the pain triggered by minor things such as rubbing against the sheets at night? These questions often help define the type of pain involved. Once your caregiver knows what is happening, treatment can begin. Anticonvulsant, antidepressant drugs, and various pain relievers seem to work in some cases. If another condition, such as diabetes is involved, better management of that disorder may relieve the neuropathic pain.  TREATMENT  Neuropathic pain is frequently long-lasting and tends not to respond to treatment with narcotic type pain medication. It may respond well to other drugs such as antiseizure and antidepressant medications. Usually, neuropathic problems do not completely go away, but partial improvement is often possible with proper treatment. Your caregivers have large numbers of medications available to treat you. Do not be discouraged if you do not get immediate relief. Sometimes different medications or a combination of medications will be tried before you receive the results you are hoping for. See your caregiver if you have pain that seems to be coming from nowhere and does not go away. Help is available.  SEEK IMMEDIATE MEDICAL CARE IF:   There is a sudden change in the quality of your pain, especially if the change is on only one side of the body.  You notice changes of the skin, such as redness, black or  purple discoloration, swelling, or an ulcer.  You cannot move the affected limbs. Document Released: 08/02/2004 Document Revised: 01/28/2012 Document Reviewed: 08/02/2004 [facility] Patient Information 2015 [place], [place]. This information is not intended to replace advice given to you by your health care provider. Make sure you discuss any questions you have with your health care provider.  Shingles Shingles (herpes zoster) is an infection that is caused by the same virus that causes chickenpox (varicella). The infection causes a painful skin rash and fluid-filled blisters, which eventually break open, crust over, and heal. It may occur in any area of the body, but it usually affects only one side of the body or face. The pain of shingles usually lasts about 1 month. However, some people with shingles may develop long-term (chronic) pain in the affected area of the body. Shingles often occurs many years after the person had chickenpox. It is more common:  In people older than 50 years.  In people with weakened immune systems, such as those with HIV, AIDS, or cancer.  In people taking medicines that weaken the immune system, such as transplant medicines.  In people under great stress. CAUSES  Shingles is caused by the varicella zoster virus (VZV), which also causes chickenpox. After a person is infected with the virus, it can remain in the person's body for years in an inactive state (dormant). To cause shingles, the  virus reactivates and breaks out as an infection in a nerve root. The virus can be spread from person to person (contagious) through contact with open blisters of the shingles rash. It will only spread to people who have not had chickenpox. When these people are exposed to the virus, they may develop chickenpox. They will not develop shingles. Once the blisters scab over, the person is no longer contagious and cannot spread the virus to others. SYMPTOMS  Shingles shows up in stages.  The initial symptoms may be pain, itching, and tingling in an area of the skin. This pain is usually described as burning, stabbing, or throbbing.In a few days or weeks, a painful red rash will appear in the area where the pain, itching, and tingling were felt. The rash is usually on one side of the body in a band or belt-like pattern. Then, the rash usually turns into fluid-filled blisters. They will scab over and dry up in approximately 2-3 weeks. Flu-like symptoms may also occur with the initial symptoms, the rash, or the blisters. These may include:  Fever.  Chills.  Headache.  Upset stomach. DIAGNOSIS  Your caregiver will perform a skin exam to diagnose shingles. Skin scrapings or fluid samples may also be taken from the blisters. This sample will be examined under a microscope or sent to a lab for further testing. TREATMENT  There is no specific cure for shingles. Your caregiver will likely prescribe medicines to help you manage the pain, recover faster, and avoid long-term problems. This may include antiviral drugs, anti-inflammatory drugs, and pain medicines. HOME CARE INSTRUCTIONS   Take a cool bath or apply cool compresses to the area of the rash or blisters as directed. This may help with the pain and itching.   Only take over-the-counter or prescription medicines as directed by your caregiver.   Rest as directed by your caregiver.  Keep your rash and blisters clean with mild soap and cool water or as directed by your caregiver.  Do not pick your blisters or scratch your rash. Apply an anti-itch cream or numbing creams to the affected area as directed by your caregiver.  Keep your shingles rash covered with a loose bandage (dressing).  Avoid skin contact with:  Babies.   Pregnant women.   Children with eczema.   Elderly people with transplants.   People with chronic illnesses, such as leukemia or AIDS.   Wear loose-fitting clothing to help ease the pain of  material rubbing against the rash.  Keep all follow-up appointments with your caregiver.If the area involved is on your face, you may receive a referral for follow-up to a specialist, such as an eye doctor (ophthalmologist) or an ear, nose, and throat (ENT) doctor. Keeping all follow-up appointments will help you avoid eye complications, chronic pain, or disability.  SEEK IMMEDIATE MEDICAL CARE IF:   You have facial pain, pain around the eye area, or loss of feeling on one side of your face.  You have ear pain or ringing in your ear.  You have loss of taste.  Your pain is not relieved with prescribed medicines.   Your redness or swelling spreads.   You have more pain and swelling.  Your condition is worsening or has changed.   You have a feveror persistent symptoms for more than 2-3 days.  You have a fever and your symptoms suddenly get worse. MAKE SURE YOU:  Understand these instructions.  Will watch your condition.  Will get help right away if you  are not doing well or get worse. Document Released: 11/05/2005 Document Revised: 07/30/2012 Document Reviewed: 06/19/2012 [facility] Patient Information 2015 [place], [place]. This information is not intended to replace advice given to you by your health care provider. Make sure you discuss any questions you have with your health care provider.   Emergency Department Resource Guide 1) Find a Doctor and Pay Out of Pocket Although you won't have to find out who is covered by your insurance plan, it is a good idea to ask around and get recommendations. You will then need to call the office and see if the doctor you have chosen will accept you as a new patient and what types of options they offer for patients who are self-pay. Some doctors offer discounts or will set up payment plans for their patients who do not have insurance, but you will need to ask so you aren't surprised when you get to your appointment.  2) Contact Your Local  Health Department Not all health departments have doctors that can see patients for sick visits, but many do, so it is worth a call to see if yours does. If you don't know where your local health department is, you can check in your phone book. The CDC also has a tool to help you locate your state's health department, and many state websites also have listings of all of their local health departments.  3) Find a [facility] If your illness is not likely to be very severe or complicated, you may want to try a walk in clinic. These are popping up all over the country in pharmacies, drugstores, and shopping centers. They're usually staffed by nurse practitioners or physician assistants that have been trained to treat common illnesses and complaints. They're usually fairly quick and inexpensive. However, if you have serious medical issues or chronic medical problems, these are probably not your best option.  No Primary Care Doctor: - Call Health Connect at  [phone] - they can help you locate a primary care doctor that  accepts your insurance, provides certain services, etc. - Physician Referral Service- [phone]  Chronic Pain Problems: Organization         Address  Phone   Notes  [facility]  [phone] Patients need to be referred by their primary care doctor.   Medication Assistance: Organization         Address  Phone   Notes  [facility] Medication [facility] [address] [place], [place] [address] [phone] --Must be a resident of [facility] -- Must have NO insurance coverage whatsoever (no Medicaid/ Medicare, etc.) -- The pt. MUST have a primary care doctor that directs their care regularly and follows them in the community   [facility]ist  [phone]   [employer]  [phone]    Agencies that provide inexpensive medical care: Organization         Address  Phone   Notes  [place]  [phone]    [facility]    [phone]   [facility] [place], [place] [address] [phone]   [place] [address], [place] [phone]   Planned Parenthood    [phone]   [facility]    [phone]   [place] and [place] [address], [place] Phone:  [phone], Fax:  [phone] Hours of Operation:  9 am - 6 pm, M-F.  Also accepts Medicaid/Medicare and self-pay.  [facility] for [place] [address], [address], [place] Phone: [phone], Fax: [phone]. Hours of Operation:  8:30 am - 5:30 pm, M-F.  Also accepts Medicaid and self-pay.  [facility] [address] [place] Phone: [phone]   [place], [place], [place] [phone], Ext. [phone] Mondays & Thursdays: 7-9 AM.  First 15 patients are seen on a first come, first serve basis.    [place] Providers:  Organization         Address  Phone   Notes  [facility] [address], [place] [phone] Also accepts self-pay patients.  [facility] [address], [place]  [phone]   [place], [address], [place] [phone]   [facility] [address] [place] [phone]   Lucianne Lei [address], [place]   [phone] Only accepts [place] Access [place] patients after they have their name applied to their card.   Self-Pay (no insurance) in [facility]:  Organization         Address  Phone   Notes  Sickle Cell Patients, [facility] [place] [phone]   [facility] Care [place] [phone]   [facility] [place]  [address], [place],  [place] [phone]   [facility]/Dr. Osei-Bonsu  [address], [place] or [address], [place] [phone] Phone number for both [place] and [place] locations is the same.  [facility] [address], [place] [phone]   [facility] [address] [place] or [address] [phone]   [facility] [address], [place] [phone], phone; [phone], fax Sees patients 1st and 3rd Saturday of every month.  Must not qualify for public or private insurance (i.e. Medicaid, Medicare, [place] Health Choice, Veterans' Benefits)  Household income should be no more than 200% of the poverty level The clinic cannot treat you if you are pregnant or think you are pregnant  Sexually transmitted diseases are not treated at the clinic.    Dental Care: Organization         Address  Phone  Notes  [facility] Department of [facility] [place] [phone] Accepts children up to age 74 who are enrolled in [place] or [place]; pregnant women with a Medicaid card; and children who have applied for Medicaid or [place] Health Choice, but were declined, whose parents can pay a reduced fee at time of service.  [facility] Department of [facility]  [address], [place] [phone] Accepts children up to age 14 who are enrolled in [place] or [place]; pregnant women with a Medicaid card; and children who have applied for Medicaid or [place] Health Choice, but were declined, whose parents can pay a reduced fee at time of service.  [facility]  [place] [phone] Patients are seen by appointment only. Walk-ins are not accepted. [place] will see patients 18 years of  age and older. Monday - Tuesday (8am-5pm) Most Wednesdays  (8:30-5pm) $30 per visit, cash only  [facility] Adult [facility] Access PRO[facility]  [address], [facility] [phone] Patients are seen by appointment only. Walk-ins are not accepted. [place] will see patients 41 years of age and older. One Wednesday Evening (Monthly: Volunteer Based).  $30 per visit, cash only  [place]  [phone] for adults; Children under age 29, call Graduate Pediatric Dentistry at [phone]. Children aged 55-14, please call [phone] to request a pediatric application.  Dental services are provided in all areas of dental care including fillings, crowns and bridges, complete and partial dentures, implants, gum treatment, root canals, and extractions. Preventive care is also provided. Treatment is provided to both adults and children. Patients are selected via a lottery and there is often a waiting list.   [facility] [address] [place]  [phone] [email or web address]   [facility] [address] [place], [place] [phone], Ext. [phone] Second and Fourth Thursday of each month, opens at 6:30 AM; Clinic ends at 9 AM.  Patients are seen on a first-come first-served basis, and a limited number are seen during each clinic.   [facility]  [address] [place], [place] [phone]   Eligibility Requirements You must have lived in [place], [place], or [place] counties for at least the last three months.   You cannot be eligible for state or federal sponsored [employer], including [employer], [place], or [employer].   You generally cannot be eligible for healthcare insurance through your employer.    How to apply: Eligibility screenings are held every Tuesday and Wednesday afternoon from 1:00 pm until 4:00 pm. You do not need an appointment for the interview!  [facility] [address] [place], [place]   [place]  [place] Department  [place]  [phone]    Behavioral Health Resources in the Community: Intensive Outpatient Programs Organization         Address  Phone  Notes  [place]. [address] [place], [place] [phone]   [facility] Outpatient [address] [place], [place]   ADS: Alcohol & Drug Svcs [address], [place], [place]   [place] [address]  [place], [place] or [phone]   Substance Abuse Resources Organization         Address  Phone  Notes  Alcohol and Drug Services  [phone]   [place]  [phone]   The [place]   Chinita Pester  [phone]   Residential & Outpatient Substance Abuse Program  [phone]   Psychological Services Organization         Address  Phone  Notes  [facility] [place]  [place]  [phone]   [place] [address] [place] or [phone]    Mobile Crisis Teams Organization         Address  Phone  Notes  [facility], Mobile Crisis Care Unit  [phone]   [facility]  [address] [place], [place]   [facility] [address] [place] [phone]    Self-Help/Support Groups Organization  Address  Phone             Notes  [place]. of [place] - variety of support groups  [place] Call for more information  Narcotics Anonymous (NA), [facility] [address], [employer] [place]  2 meetings at this location   Special educational needs teacher         Address  Phone  Notes  [facility] Residential Treatment [place],    [place]  [phone]   [facility]  [address] [place] [id number], [place], [place]   [place], [place] [phone] Admissions: 8am-3pm M-F  Incentives Substance [place] [address],    [place], [place] [phone]   [facility] [address] [place], [place], [place]   [facility] [address],  [place], [place]   [facility] - Intensive Outpatient [address], [place], [place]   [facility] ([place].,  [place], [place] [phone] or [phone]   [facility] (RT[facility]) [address], [place], [place] Accepts Medicaid  [facility] [place] [address]  [place] [phone] Substance Abuse/Addiction Treatment   [facility] Organization         Address  Phone  Notes  [facility]  [phone]   Domenic Schwab, PhD [address] [place], [place]   [phone] or [phone]   [place], [place] [phone]   [facility] [address] [place], [place] [phone] Insurance/Medicaid/sponsorship through [facility] and Families [address], [address]                                    [place], [place] [phone] [place] [address], [place] [phone]    Dr. Adele Schilder  [phone]   [facility] Dept. 1) [address] [place] 2) [place] 3)  [place] [address], [place] [phone]  [phone]   [place] [phone] or [phone] (After Hours)

## 2014-05-06 NOTE — ED Provider Notes (Signed)
CSN: [id number]     Arrival date & time 05/06/14  0912 History   First MD Initiated Contact with Patient 05/06/14 [phone]     Chief Complaint  Patient presents with  . Pruritis     (Consider location/radiation/quality/duration/timing/severity/associated sxs/prior Treatment) HPI  68 year-old female with left facial/head pain. Describes a lot of burning, pain and itching to her left forhead and and along her hairline. Patient was recently seen and diagnosed with shingles in the left V1 distribution. No visual complaints. No fever or chills. No numbness, tingling or loss of strength.  Past Medical History  Diagnosis Date  . Diabetes mellitus without complication   . High cholesterol   . Hypertension    Past Surgical History  Procedure Laterality Date  . Abdominal surgery    . Rotator cuff repair      bilateral   No family history on file. History  Substance Use Topics  . Smoking status: Not on file  . Smokeless tobacco: Not on file  . Alcohol Use: No   OB History   Grav Para Term Preterm Abortions TAB SAB Ect Mult Living                 Review of Systems  All systems reviewed and negative, other than as noted in HPI.   Allergies  Penicillins; Ibuprofen; and Nsaids  Home Medications   Prior to Admission medications   Medication Sig Start Date End Date Taking? Authorizing Provider  amLODipine (NORVASC) 5 MG tablet Take 5 mg by mouth daily.   Yes Historical Provider, MD  oxyCODONE-acetaminophen (PERCOCET/ROXICET) 5-325 MG per tablet Take 1-2 tablets by mouth every 6 (six) hours as needed for severe pain.   Yes Historical Provider, MD  potassium chloride SA (K-DUR,KLOR-CON) 20 MEQ tablet Take 1 tablet (20 mEq total) by mouth daily. 04/22/14  Yes Nicole Pisciotta, PA-C  rosuvastatin (CRESTOR) 10 MG tablet Take 10 mg by mouth daily.   Yes Historical Provider, MD   BP 135/76  Pulse 114  Temp(Src) 99.1 F (37.3 C) (Oral)  Resp 16  SpO2 100% Physical Exam   Nursing note and vitals reviewed. Constitutional: She is oriented to person, place, and time. She appears well-developed and well-nourished. No distress.  HENT:  Head: Normocephalic and atraumatic.  Eyes: Conjunctivae are normal. Right eye exhibits no discharge. Left eye exhibits no discharge.  Neck: Neck supple.  No nuchal rigidity  Cardiovascular: Normal rate, regular rhythm and normal heart sounds.  Exam reveals no gallop and no friction rub.   No murmur heard. Pulmonary/Chest: Effort normal and breath sounds normal. No respiratory distress.  Abdominal: Soft. She exhibits no distension. There is no tenderness.  Musculoskeletal: She exhibits no edema and no tenderness.  Neurological: She is alert and oriented to person, place, and time. No cranial nerve deficit. She exhibits normal muscle tone. Coordination normal.  Skin:  Rash consistent with shingles L v1 distribution. No apparent ocular involvement.    Psychiatric: She has a normal mood and affect. Her behavior is normal. Thought content normal.    ED Course  Procedures (including critical care time) Labs Review Labs Reviewed - No data to display  Imaging Review No results found.   EKG Interpretation None      MDM   Final diagnoses:  Shingles    67yF with pain/itching in face/head. Additional symptomatic tx with what is most likely shingles 1st branch L trigeminal nerve.     Virgel Manifold, MD 05/06/14 [phone]

## 2014-05-10 DIAGNOSIS — E119 Type 2 diabetes mellitus without complications: Secondary | ICD-10-CM | POA: Diagnosis not present

## 2014-05-10 DIAGNOSIS — B009 Herpesviral infection, unspecified: Secondary | ICD-10-CM | POA: Diagnosis not present

## 2014-05-18 ENCOUNTER — Encounter (HOSPITAL_COMMUNITY): Payer: Self-pay | Admitting: Emergency Medicine

## 2014-05-18 ENCOUNTER — Emergency Department (HOSPITAL_COMMUNITY)
Admission: EM | Admit: 2014-05-18 | Discharge: 2014-05-18 | Disposition: A | Discharge status: Home / Self Care | Payer: Medicare Other | Attending: Emergency Medicine | Admitting: Emergency Medicine

## 2014-05-18 ENCOUNTER — Emergency Department (HOSPITAL_COMMUNITY): Payer: Medicare Other

## 2014-05-18 DIAGNOSIS — E7800 Pure hypercholesterolemia, unspecified: Secondary | ICD-10-CM | POA: Diagnosis not present

## 2014-05-18 DIAGNOSIS — H5789 Other specified disorders of eye and adnexa: Secondary | ICD-10-CM | POA: Insufficient documentation

## 2014-05-18 DIAGNOSIS — E119 Type 2 diabetes mellitus without complications: Secondary | ICD-10-CM | POA: Diagnosis not present

## 2014-05-18 DIAGNOSIS — B0229 Other postherpetic nervous system involvement: Secondary | ICD-10-CM | POA: Insufficient documentation

## 2014-05-18 DIAGNOSIS — G4489 Other headache syndrome: Secondary | ICD-10-CM | POA: Diagnosis not present

## 2014-05-18 DIAGNOSIS — Z79899 Other long term (current) drug therapy: Secondary | ICD-10-CM | POA: Insufficient documentation

## 2014-05-18 DIAGNOSIS — I10 Essential (primary) hypertension: Secondary | ICD-10-CM | POA: Insufficient documentation

## 2014-05-18 DIAGNOSIS — Z880 Allergy status to penicillin: Secondary | ICD-10-CM | POA: Insufficient documentation

## 2014-05-18 DIAGNOSIS — R1084 Generalized abdominal pain: Secondary | ICD-10-CM | POA: Diagnosis not present

## 2014-05-18 DIAGNOSIS — R51 Headache: Secondary | ICD-10-CM | POA: Diagnosis not present

## 2014-05-18 DIAGNOSIS — B029 Zoster without complications: Secondary | ICD-10-CM | POA: Diagnosis not present

## 2014-05-18 HISTORY — DX: Zoster without complications: B02.9

## 2014-05-18 LAB — COMPREHENSIVE METABOLIC PANEL
ALBUMIN: 4.2 g/dL (ref 3.5–5.2)
ALT: 39 U/L — ABNORMAL HIGH (ref 0–35)
AST: 28 U/L (ref 0–37)
Alkaline Phosphatase: 49 U/L (ref 39–117)
BILIRUBIN TOTAL: 0.5 mg/dL (ref 0.3–1.2)
BUN: 16 mg/dL (ref 6–23)
CALCIUM: 9.9 mg/dL (ref 8.4–10.5)
CHLORIDE: 99 meq/L (ref 96–112)
CO2: 29 mEq/L (ref 19–32)
CREATININE: 0.99 mg/dL (ref 0.50–1.10)
GFR calc Af Amer: 66 mL/min — ABNORMAL LOW (ref >90)
GFR calc non Af Amer: 57 mL/min — ABNORMAL LOW (ref >90)
Glucose, Bld: 111 mg/dL — ABNORMAL HIGH (ref 70–99)
Potassium: 3.2 mEq/L — ABNORMAL LOW (ref 3.7–5.3)
Sodium: 143 mEq/L (ref 137–147)
Total Protein: 7.2 g/dL (ref 6.0–8.3)

## 2014-05-18 LAB — CBC WITH DIFFERENTIAL/PLATELET
Basophils Absolute: 0 10*3/uL (ref 0.0–0.1)
Basophils Relative: 0 % (ref 0–1)
EOS PCT: 0 % (ref 0–5)
Eosinophils Absolute: 0 10*3/uL (ref 0.0–0.7)
HCT: 40.5 % (ref 36.0–46.0)
HEMOGLOBIN: 13.8 g/dL (ref 12.0–15.0)
LYMPHS ABS: 2.6 10*3/uL (ref 0.7–4.0)
Lymphocytes Relative: 26 % (ref 12–46)
MCH: 32.2 pg (ref 26.0–34.0)
MCHC: 34.1 g/dL (ref 30.0–36.0)
MCV: 94.6 fL (ref 78.0–100.0)
MONOS PCT: 5 % (ref 3–12)
Monocytes Absolute: 0.5 10*3/uL (ref 0.1–1.0)
NEUTROS PCT: 69 % (ref 43–77)
Neutro Abs: 7.1 10*3/uL (ref 1.7–7.7)
PLATELETS: 239 10*3/uL (ref 150–400)
RBC: 4.28 MIL/uL (ref 3.87–5.11)
RDW: 14.7 % (ref 11.5–15.5)
WBC: 10.3 10*3/uL (ref 4.0–10.5)

## 2014-05-18 LAB — LIPASE, BLOOD: LIPASE: 41 U/L (ref 11–59)

## 2014-05-18 MED ORDER — HYDROMORPHONE HCL PF 1 MG/ML IJ SOLN
0.5000 mg | Freq: Once | INTRAMUSCULAR | Status: AC
  Administered 2014-05-18: 0.5 mg via INTRAVENOUS
  Filled 2014-05-18: qty 1

## 2014-05-18 MED ORDER — ONDANSETRON 4 MG PO TBDP: 4.0000 mg | ORAL_TABLET | Freq: Three times a day (TID) | ORAL | Status: DC | PRN

## 2014-05-18 MED ORDER — MORPHINE SULFATE 2 MG/ML IJ SOLN
2.0000 mg | Freq: Once | INTRAMUSCULAR | Status: AC
  Administered 2014-05-18: 2 mg via INTRAVENOUS
  Filled 2014-05-18: qty 1

## 2014-05-18 MED ORDER — LORAZEPAM 2 MG/ML IJ SOLN
0.5000 mg | Freq: Once | INTRAMUSCULAR | Status: AC
  Administered 2014-05-18: 0.5 mg via INTRAVENOUS
  Filled 2014-05-18: qty 1

## 2014-05-18 MED ORDER — GABAPENTIN 300 MG PO CAPS: 300.0000 mg | ORAL_CAPSULE | Freq: Three times a day (TID) | ORAL | Status: DC

## 2014-05-18 MED ORDER — OXYCODONE-ACETAMINOPHEN 10-325 MG PO TABS: 1.0000 | ORAL_TABLET | ORAL | Status: DC | PRN

## 2014-05-18 NOTE — ED Provider Notes (Signed)
CSN: [id number]     Arrival date & time 05/18/14  8416 History   First MD Initiated Contact with Patient 05/18/14 [phone]     Chief Complaint  Patient presents with  . Headache     (Consider location/radiation/quality/duration/timing/severity/associated sxs/prior Treatment) Patient is a 68 y.o. female presenting with headaches.  Headache Associated symptoms: abdominal pain, nausea and vomiting   Associated symptoms: no fever     Patient is a 68 year old female, who was recently treated for facial shingles. She presents today complaining of worsening left-sided headache for the last 4 days. She vomited 3 times since yesterday, and states she feels weak all over. Her shingles was over her left 4 head. She has followed with ophthalmology due to concerns of possible ocular involvement. According to her daughter, her eye has been doing well according to the ophthalmologist. History is limited as patient is in severe pain. She has reportedly not been eating since being diagnosed with shingles. She had some epigastric abdominal pain last night, but no abdominal pain right now. She does endorse blurry vision. She denies any fevers. She recently completed a short course of steroids. She was also given a prescription for Ativan and a prior emergency room visit. Daughter states that this just makes her loopy. It does not actually help her pain.  Past Medical History  Diagnosis Date  . Diabetes mellitus without complication   . High cholesterol   . Hypertension   . Shingles    Past Surgical History  Procedure Laterality Date  . Abdominal surgery    . Rotator cuff repair      bilateral   No family history on file. History  Substance Use Topics  . Smoking status: Never Smoker   . Smokeless tobacco: Not on file  . Alcohol Use: No   OB History   Grav Para Term Preterm Abortions TAB SAB Ect Mult Living                 Review of Systems  Constitutional: Negative for fever.  Eyes: Positive for  visual disturbance.  Gastrointestinal: Positive for nausea, vomiting and abdominal pain.  Neurological: Positive for weakness and headaches.      Allergies  Penicillins and Nsaids  Home Medications   Prior to Admission medications   Medication Sig Start Date End Date Taking? Authorizing Provider  amLODipine (NORVASC) 5 MG tablet Take 5 mg by mouth daily.    Historical Provider, MD  LORazepam (ATIVAN) 1 MG tablet Take 1 tablet (1 mg total) by mouth 3 (three) times daily as needed (muscle spasm). 05/06/14   Virgel Manifold, MD  oxyCODONE-acetaminophen (PERCOCET/ROXICET) 5-325 MG per tablet Take 1-2 tablets by mouth every 4 (four) hours as needed for severe pain. 05/06/14   Virgel Manifold, MD   BP 120/60  Pulse 81  Temp(Src) 98.8 F (37.1 C) (Oral)  Resp 16  SpO2 100% Physical Exam  Constitutional: She appears well-developed and well-nourished. She appears distressed.  HENT:  Head: Normocephalic and atraumatic.  Mouth/Throat: Oropharynx is clear and moist.  Significant pain with light touch to the left face.  Eyes: Pupils are equal, round, and reactive to light. Right eye exhibits no discharge. Left eye exhibits discharge.  Clear watery discharge from left eye.  Neck:  Able to fully flex neck without rigidity.  Cardiovascular: Normal rate, regular rhythm and normal heart sounds.   No murmur heard. Pulmonary/Chest: Effort normal and breath sounds normal. No respiratory distress. She has no wheezes. She has no rales.  Abdominal: Soft. Bowel sounds are normal. There is tenderness.  Generalized abdominal tenderness. No peritoneal signs.  Musculoskeletal: She exhibits no edema.  Neurological: She is alert. No cranial nerve deficit.  No appreciable cranial nerve deficit, although patient does not fully cooperate with exam.  Skin: Skin is warm and dry.  No vesicles appreciable on face    ED Course  Procedures (including critical care time) Labs Review Labs Reviewed  COMPREHENSIVE  METABOLIC PANEL - Abnormal; Notable for the following:    Potassium 3.2 (*)    Glucose, Bld 111 (*)    ALT 39 (*)    GFR calc non Af Amer 57 (*)    GFR calc Af Amer 66 (*)    All other components within normal limits  CBC WITH DIFFERENTIAL  LIPASE, BLOOD    Imaging Review Ct Head Wo Contrast  05/18/2014   CLINICAL DATA:  Headache  EXAM: CT HEAD WITHOUT CONTRAST  TECHNIQUE: Contiguous axial images were obtained from the base of the skull through the vertex without intravenous contrast.  COMPARISON:  09/21/2009  FINDINGS: Cerebral volume is normal for age. Negative for acute infarct. Negative for hemorrhage, mass, or edema. Calvarium intact.  IMPRESSION: Negative   Electronically Signed   By: Franchot Gallo M.D.   On: 05/18/2014 12:50     EKG Interpretation None      MDM   Final diagnoses:  Postherpetic neuralgia  Other headache syndrome    68 year old female with recent diagnosis of facial shingles, presenting with continued facial pain and headache. Her neuro exam is currently nonfocal. CT head was negative for any acute abnormalities. This is likely postherpetic neuralgia. She'll be discharged with narcotic pain medication and gabapentin for neuropathic pain control. She's been instructed to followup with her primary doctor within one week.  Chrisandra Netters, MD Family Medicine PGY-2   Leeanne Rio, MD 05/18/14 [phone]

## 2014-05-18 NOTE — Progress Notes (Signed)
  CARE MANAGEMENT ED NOTE 05/18/2014  Patient:  Beth Hubbard, Beth Hubbard   Account Number:  [id number]  Date Initiated:  05/18/2014  Documentation initiated by:  Jackelyn Poling  Subjective/Objective Assessment:   68 yr old medicare[place]medicaid [place] pt c/o had shingles over a month ago and was treated-now having left sided head pain No pcp listed Female family at bedside states pt sees Dr Criss Rosales but wants to change providers     Subjective/Objective Assessment Detail:   Agreed to receive a list of in network medicare providers for pt to review     Action/Plan:   CM spoke with pt and female at bedside Provided a list of in network medicare providers [place] [address]   Action/Plan Detail:   Anticipated DC Date:  05/18/2014     Status Recommendation to Physician:   Result of Recommendation:    Other ED [place]  Other  PCP issues  Outpatient Services - Pt will follow up    Choice offered to / List presented to:            Status of service:  Completed, signed off  ED Comments:   ED Comments Detail:

## 2014-05-18 NOTE — Discharge Instructions (Signed)
Your headache is likely due to your shingles. The CT scan we did of your head did not show any abnormalities. You have been given a prescription for oxycodone. You can take 1 tablet every 4-6 hours as needed. You have also been given a prescription for gabapentin. This is a medication that will help the neuropathic pain. Please followup with your primary doctor within one week.  If you have any worsening pain, fevers, and neck stiffness, or any other new concerns, please return to the emergency room.   Postherpetic Neuralgia Shingles is a painful disease. It is caused by the herpes zoster virus. This is the same virus which also causes chickenpox. It can affect the torso, limbs, or the face. For most people, shingles is a condition of rather sudden onset. Pain usually lasts about 1 month. In older patients, or patients with poor immune systems, a painful, long-standing (chronic) condition called postherpetic neuralgia can develop. This condition rarely happens before age 35. But at least 50% of people over 50 become affected following an attack of shingles. There is a natural tendency for this condition to improve over time with no treatment. Less than 5% of patients have pain that lasts for more than 1 year. DIAGNOSIS  Herpes is usually easily diagnosed on physical exam. Pain sometimes follows when the skin sores (lesions) have disappeared. It is called postherpetic neuralgia. That name simply means the pain that follows herpes. TREATMENT   Treating this condition may be difficult. Usually one of the tricyclic antidepressants, often amitriptyline, is the first line of treatment. There is evidence that the sooner these medications are given, the more likely they are to reduce pain.  Conventional analgesics, regional nerve blocks, and anticonvulsants have little benefit in most cases when used alone. Other tricyclic anti-depressants are used as a second option if the first antidepressant is  unsuccessful.  Anticonvulsants, including carbamazepine, have been found to provide some added benefit when used with a tricyclic anti-depressant. This is especially for the stabbing type of pain similar to that of trigeminal neuralgia.  Chronic opioid therapy. This is a strong narcotic pain medication. It is used to treat pain that is resistant to other measures. The issues of dependency and tolerance can be reduced with closely managed care.  Some cream treatments are applied locally to the affected area. They can help when used with other treatments. Their use may be difficult in the case of postherpetic trigeminal neuralgia. This is involved with the face. So the substances can irritate the eye and the skin around the eye. Examples of creams used include Capsaicin and lidocaine creams.  For shingles, antiviral therapies along with analgesics are recommended. Studies of the effect of anti-viral agents such as acyclovir on shingles have been done. They show improved rates of healing and decreased severity of sudden (acute) pain. Some observations suggest that nerve blocks during shingles infection will:  Reduce pain.  Shorten the acute episode.  Prevent the emergence of postherpetic neuralgia. Viral medications used include Acyclovir (Zovirax), Valacyclovir, Famciclovir and a lysine diet. Document Released: 01/26/2003 Document Revised: 01/28/2012 Document Reviewed: 10/27/2013 [facility] Patient Information 2015 [place], [place]. This information is not intended to replace advice given to you by your health care provider. Make sure you discuss any questions you have with your health care provider.

## 2014-05-18 NOTE — ED Notes (Signed)
Per pt/family-had shingles over a month ago and was treated-now having left sided head pain

## 2014-05-18 NOTE — ED Notes (Signed)
MD at bedside. 

## 2014-05-19 DIAGNOSIS — H40019 Open angle with borderline findings, low risk, unspecified eye: Secondary | ICD-10-CM | POA: Diagnosis not present

## 2014-05-19 DIAGNOSIS — B0229 Other postherpetic nervous system involvement: Secondary | ICD-10-CM | POA: Diagnosis not present

## 2014-05-20 NOTE — ED Provider Notes (Signed)
I saw and evaluated the patient, reviewed the resident's note and I agree with the findings and plan.  Pt with recent dx shingles left forehead/scalp.  C/o left frontal headache. Pt/fam request ct. No neck pain or stiffness. No eye pain or change in vision. No fevers/chills. Conj not injected. Perrl. Eom. No periorbital or orbital cell. No dendritic or corneal lesions visualized. No temporal tenderness. Motor/sens intact. Normal coordination. Pain rx   Mirna Mires, MD 05/20/14 [phone]

## 2014-05-21 DIAGNOSIS — M7710 Lateral epicondylitis, unspecified elbow: Secondary | ICD-10-CM | POA: Diagnosis not present

## 2014-05-21 DIAGNOSIS — M25529 Pain in unspecified elbow: Secondary | ICD-10-CM | POA: Diagnosis not present

## 2014-05-26 DIAGNOSIS — E119 Type 2 diabetes mellitus without complications: Secondary | ICD-10-CM | POA: Diagnosis not present

## 2014-05-26 DIAGNOSIS — B0223 Postherpetic polyneuropathy: Secondary | ICD-10-CM | POA: Diagnosis not present

## 2014-06-16 DIAGNOSIS — B009 Herpesviral infection, unspecified: Secondary | ICD-10-CM | POA: Diagnosis not present

## 2014-06-24 ENCOUNTER — Emergency Department (HOSPITAL_COMMUNITY): Payer: No Typology Code available for payment source

## 2014-06-24 ENCOUNTER — Encounter (HOSPITAL_COMMUNITY): Payer: Self-pay | Admitting: Emergency Medicine

## 2014-06-24 ENCOUNTER — Emergency Department (HOSPITAL_COMMUNITY)
Admission: EM | Admit: 2014-06-24 | Discharge: 2014-06-24 | Disposition: A | Discharge status: Home / Self Care | Payer: No Typology Code available for payment source | Attending: Emergency Medicine | Admitting: Emergency Medicine

## 2014-06-24 DIAGNOSIS — S0993XA Unspecified injury of face, initial encounter: Secondary | ICD-10-CM | POA: Diagnosis present

## 2014-06-24 DIAGNOSIS — Z8619 Personal history of other infectious and parasitic diseases: Secondary | ICD-10-CM | POA: Diagnosis not present

## 2014-06-24 DIAGNOSIS — Y92410 Unspecified street and highway as the place of occurrence of the external cause: Secondary | ICD-10-CM | POA: Insufficient documentation

## 2014-06-24 DIAGNOSIS — Y9389 Activity, other specified: Secondary | ICD-10-CM | POA: Insufficient documentation

## 2014-06-24 DIAGNOSIS — S161XXA Strain of muscle, fascia and tendon at neck level, initial encounter: Secondary | ICD-10-CM

## 2014-06-24 DIAGNOSIS — S139XXA Sprain of joints and ligaments of unspecified parts of neck, initial encounter: Secondary | ICD-10-CM | POA: Insufficient documentation

## 2014-06-24 DIAGNOSIS — M549 Dorsalgia, unspecified: Secondary | ICD-10-CM | POA: Diagnosis not present

## 2014-06-24 DIAGNOSIS — E7800 Pure hypercholesterolemia, unspecified: Secondary | ICD-10-CM | POA: Diagnosis not present

## 2014-06-24 DIAGNOSIS — E119 Type 2 diabetes mellitus without complications: Secondary | ICD-10-CM | POA: Insufficient documentation

## 2014-06-24 DIAGNOSIS — Z880 Allergy status to penicillin: Secondary | ICD-10-CM | POA: Diagnosis not present

## 2014-06-24 DIAGNOSIS — IMO0002 Reserved for concepts with insufficient information to code with codable children: Secondary | ICD-10-CM | POA: Insufficient documentation

## 2014-06-24 DIAGNOSIS — I10 Essential (primary) hypertension: Secondary | ICD-10-CM | POA: Insufficient documentation

## 2014-06-24 DIAGNOSIS — S0990XA Unspecified injury of head, initial encounter: Secondary | ICD-10-CM | POA: Insufficient documentation

## 2014-06-24 DIAGNOSIS — S199XXA Unspecified injury of neck, initial encounter: Secondary | ICD-10-CM

## 2014-06-24 MED ORDER — METHOCARBAMOL 500 MG PO TABS: 500.0000 mg | ORAL_TABLET | Freq: Three times a day (TID) | ORAL | Status: DC | PRN

## 2014-06-24 MED ORDER — HYDROCODONE-ACETAMINOPHEN 5-325 MG PO TABS: 1.0000 | ORAL_TABLET | ORAL | Status: DC | PRN

## 2014-06-24 MED ORDER — HYDROCODONE-ACETAMINOPHEN 5-325 MG PO TABS
1.0000 | ORAL_TABLET | Freq: Once | ORAL | Status: AC
  Administered 2014-06-24: 1 via ORAL
  Filled 2014-06-24: qty 1

## 2014-06-24 MED ORDER — ONDANSETRON 4 MG PO TBDP
4.0000 mg | ORAL_TABLET | Freq: Once | ORAL | Status: AC
  Administered 2014-06-24: 4 mg via ORAL
  Filled 2014-06-24: qty 1
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
## 2014-06-24 NOTE — ED Notes (Signed)
Patient transported to X-ray 

## 2014-06-24 NOTE — Discharge Instructions (Signed)

## 2014-07-02 DIAGNOSIS — M7710 Lateral epicondylitis, unspecified elbow: Secondary | ICD-10-CM | POA: Diagnosis not present

## 2014-08-14 DIAGNOSIS — F411 Generalized anxiety disorder: Secondary | ICD-10-CM | POA: Diagnosis not present

## 2014-08-14 DIAGNOSIS — B009 Herpesviral infection, unspecified: Secondary | ICD-10-CM | POA: Diagnosis not present

## 2014-08-14 DIAGNOSIS — E119 Type 2 diabetes mellitus without complications: Secondary | ICD-10-CM | POA: Diagnosis not present

## 2014-08-14 DIAGNOSIS — I10 Essential (primary) hypertension: Secondary | ICD-10-CM | POA: Diagnosis not present

## 2014-08-14 DIAGNOSIS — F3289 Other specified depressive episodes: Secondary | ICD-10-CM | POA: Diagnosis not present

## 2014-08-14 DIAGNOSIS — F329 Major depressive disorder, single episode, unspecified: Secondary | ICD-10-CM | POA: Diagnosis not present

## 2014-08-25 DIAGNOSIS — Z23 Encounter for immunization: Secondary | ICD-10-CM | POA: Diagnosis not present

## 2014-08-25 DIAGNOSIS — H4011X1 Primary open-angle glaucoma, mild stage: Secondary | ICD-10-CM | POA: Diagnosis not present

## 2014-08-30 DIAGNOSIS — S63509A Unspecified sprain of unspecified wrist, initial encounter: Secondary | ICD-10-CM | POA: Diagnosis not present

## 2014-08-31 ENCOUNTER — Ambulatory Visit
Admission: RE | Admit: 2014-08-31 | Discharge: 2014-08-31 | Disposition: A | Discharge status: Home / Self Care | Payer: Medicare Other | Source: Ambulatory Visit | Attending: Orthopedic Surgery | Admitting: Orthopedic Surgery

## 2014-08-31 ENCOUNTER — Other Ambulatory Visit: Payer: Self-pay | Admitting: Orthopedic Surgery

## 2014-08-31 DIAGNOSIS — M79671 Pain in right foot: Secondary | ICD-10-CM | POA: Diagnosis not present

## 2014-08-31 DIAGNOSIS — M25532 Pain in left wrist: Secondary | ICD-10-CM

## 2014-08-31 DIAGNOSIS — M25512 Pain in left shoulder: Secondary | ICD-10-CM

## 2014-08-31 DIAGNOSIS — S99921A Unspecified injury of right foot, initial encounter: Secondary | ICD-10-CM | POA: Diagnosis not present

## 2014-08-31 DIAGNOSIS — S6992XA Unspecified injury of left wrist, hand and finger(s), initial encounter: Secondary | ICD-10-CM | POA: Diagnosis not present

## 2014-09-06 DIAGNOSIS — S63509D Unspecified sprain of unspecified wrist, subsequent encounter: Secondary | ICD-10-CM | POA: Diagnosis not present

## 2014-10-07 ENCOUNTER — Encounter: Payer: Self-pay | Admitting: Cardiology

## 2014-10-07 ENCOUNTER — Ambulatory Visit (INDEPENDENT_AMBULATORY_CARE_PROVIDER_SITE_OTHER): Payer: Medicare Other | Admitting: Cardiology

## 2014-10-07 VITALS — BP 130/72 | HR 81 | Ht 62.0 in | Wt 141.0 lb

## 2014-10-07 DIAGNOSIS — E119 Type 2 diabetes mellitus without complications: Secondary | ICD-10-CM | POA: Insufficient documentation

## 2014-10-07 DIAGNOSIS — G4710 Hypersomnia, unspecified: Secondary | ICD-10-CM

## 2014-10-07 DIAGNOSIS — R0602 Shortness of breath: Secondary | ICD-10-CM | POA: Diagnosis not present

## 2014-10-07 DIAGNOSIS — I10 Essential (primary) hypertension: Secondary | ICD-10-CM | POA: Diagnosis not present

## 2014-10-07 DIAGNOSIS — E7800 Pure hypercholesterolemia, unspecified: Secondary | ICD-10-CM | POA: Insufficient documentation

## 2014-10-07 DIAGNOSIS — G459 Transient cerebral ischemic attack, unspecified: Secondary | ICD-10-CM | POA: Insufficient documentation

## 2014-10-07 NOTE — Progress Notes (Signed)
[place], [place], [place]  [address] Phone: [phone] Fax:  [phone]  Date:  10/07/2014   ID:  Beth Hubbard, Beth Hubbard 07/24/1946, MRN [id number]  PCP:  Elyn Peers, MD  Sleep Medicine:  Fransico Him, MD    History of Present Illness: Beth Hubbard is a 68 y.o. female with a history of DM, dyslipidemia and HTN who presents today for evaluation of sleep apnea.  She says that awakens with jerking at night that keeps her up.  She is not sleeping very well at night.  She has nonrestorative sleep and falls asleep easily during the day  Her daughter says that she snores and she has noticed her stop breathing while sleeping.  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  She also complains of DOE when walking or talking that she thinks has been getting worse over the past year.  She denies any chest pain.   Wt Readings from Last 3 Encounters:  10/07/14 141 lb (63.957 kg)  11/20/12 160 lb (72.576 kg)     Past Medical History  Diagnosis Date  . Shingles   . TIA (transient ischemic attack)   . Hypertension   . Diabetes mellitus without complication   . High cholesterol     Current Outpatient Prescriptions  Medication Sig Dispense Refill  . amLODipine (NORVASC) 5 MG tablet Take 5 mg by mouth daily.    . Brinzolamide-Brimonidine 1-0.2 % SUSP Apply to eye.    . cyclobenzaprine (FLEXERIL) 10 MG tablet Take 10 mg by mouth 3 (three) times daily.    Marland Kitchen gabapentin (NEURONTIN) 100 MG capsule Take 100-200 mg by mouth 2 (two) times daily. Take 100 mg every morning and 200 mg every night at bedtime.    Marland Kitchen HYDROcodone-acetaminophen (NORCO) 10-325 MG per tablet Take 1 tablet by mouth every 8 (eight) hours as needed (pain.).    Marland Kitchen methocarbamol (ROBAXIN) 500 MG tablet Take 1 tablet (500 mg total) by mouth 3 (three) times daily between meals as needed. 20 tablet 0  . rosuvastatin (CRESTOR) 20 MG  tablet Take 20 mg by mouth daily.    . simvastatin (ZOCOR) 40 MG tablet Take 40 mg by mouth at bedtime.  0  . sitaGLIPtin (JANUVIA) 100 MG tablet Take 100 mg by mouth daily.    . sulindac (CLINORIL) 200 MG tablet Take 200 mg by mouth 2 (two) times daily.     No current facility-administered medications for this visit.    Allergies:    Allergies  Allergen Reactions  . Aspirin     Tremor   . Penicillins Itching and Other (See Comments)    Reaction: itching,headache, dizziness and anxiety. Feels like she is "goin off"  . Nsaids Anxiety    Reaction: causes dizziness and headache. Ibuprofen. Aspirin    Social History:  The patient  reports that she has never smoked. She does not have any smokeless tobacco history on file. She reports that she does not drink alcohol or use illicit drugs.   Family History:  The patient's family history includes Cancer in her brother, father, and sister.   ROS:  Please see the history of present illness.      All other systems reviewed and negative.   PHYSICAL EXAM: VS:  BP 130/72 mmHg  Pulse 81  Ht 5\' 2"  (1.575 m)  Wt 141 lb (63.957  kg)  BMI 25.78 kg/m2 Well nourished, well developed, in no acute distress HEENT: normal Neck: no JVD Cardiac:  normal S1, S2; RRR; no murmur Lungs:  clear to auscultation bilaterally, no wheezing, rhonchi or rales Abd: soft, nontender, no hepatomegaly Ext: no edema Skin: warm and dry Neuro:  CNs 2-12 intact, no focal abnormalities noted  EKG:NSR with no ST changes       ASSESSMENT AND PLAN:  1. SOB of unclear etiology.  EKG is nonischemic.  She does have CRF including DM/HTN and dyslipidemia.  I will get a Lexiscan myoview to rule out ischemia (she cannot walk on treadmill due to nerve damage in her leg).  I will also assess LVF with echo. 2. Hypersomnia - She has snoring and witnessed apnea along with daytime sleepiness so likely has sleep apnea.  I will get a split night sleep study to assess.  Followup PRN  pending results of studies  Signed, Fransico Him, MD [facility] 10/07/2014 12:04 PM

## 2014-10-07 NOTE — Patient Instructions (Signed)
Your physician recommends that you continue on your current medications as directed. Please refer to the Current Medication list given to you today.  Your physician has requested that you have an echocardiogram. Echocardiography is a painless test that uses sound waves to create images of your heart. It provides your doctor with information about the size and shape of your heart and how well your heart's chambers and valves are working. This procedure takes approximately one hour. There are no restrictions for this procedure.  Your physician has requested that you have a lexiscan myoview. For further information please visit [email or web address]. Please follow instruction sheet, as given.  Your physician has recommended that you have a sleep study. This test records several body functions during sleep, including: brain activity, eye movement, oxygen and carbon dioxide blood levels, heart rate and rhythm, breathing rate and rhythm, the flow of air through your mouth and nose, snoring, body muscle movements, and chest and belly movement.  Your physician recommends that you schedule a follow-up appointment pending the results

## 2014-10-15 ENCOUNTER — Telehealth: Payer: Self-pay | Admitting: Cardiology

## 2014-10-15 NOTE — Telephone Encounter (Signed)
New Prob    Pts daughter has some questions regarding what medications Ms. Cormier should be taking. Please call.

## 2014-10-15 NOTE — Telephone Encounter (Signed)
Spoke with pt dtr, questions regarding medications she can take prior to nuclear stress test answered.

## 2014-10-18 ENCOUNTER — Ambulatory Visit (HOSPITAL_COMMUNITY): Payer: Medicare Other | Attending: Cardiology | Admitting: Cardiology

## 2014-10-18 ENCOUNTER — Ambulatory Visit (HOSPITAL_BASED_OUTPATIENT_CLINIC_OR_DEPARTMENT_OTHER): Payer: Medicare Other | Admitting: Radiology

## 2014-10-18 VITALS — BP 154/76 | HR 75 | Ht 62.0 in | Wt 144.0 lb

## 2014-10-18 DIAGNOSIS — I10 Essential (primary) hypertension: Secondary | ICD-10-CM | POA: Diagnosis not present

## 2014-10-18 DIAGNOSIS — R002 Palpitations: Secondary | ICD-10-CM | POA: Insufficient documentation

## 2014-10-18 DIAGNOSIS — R079 Chest pain, unspecified: Secondary | ICD-10-CM | POA: Insufficient documentation

## 2014-10-18 DIAGNOSIS — E119 Type 2 diabetes mellitus without complications: Secondary | ICD-10-CM | POA: Insufficient documentation

## 2014-10-18 DIAGNOSIS — E785 Hyperlipidemia, unspecified: Secondary | ICD-10-CM | POA: Diagnosis not present

## 2014-10-18 DIAGNOSIS — R0602 Shortness of breath: Secondary | ICD-10-CM | POA: Diagnosis not present

## 2014-10-18 DIAGNOSIS — R0609 Other forms of dyspnea: Secondary | ICD-10-CM | POA: Diagnosis not present

## 2014-10-18 MED ORDER — TECHNETIUM TC 99M SESTAMIBI GENERIC - CARDIOLITE
11.0000 | Freq: Once | INTRAVENOUS | Status: AC | PRN
  Administered 2014-10-18: 11 via INTRAVENOUS

## 2014-10-18 MED ORDER — AMINOPHYLLINE 25 MG/ML IV SOLN
75.0000 mg | Freq: Once | INTRAVENOUS | Status: AC
  Administered 2014-10-18: 75 mg via INTRAVENOUS

## 2014-10-18 MED ORDER — REGADENOSON 0.4 MG/5ML IV SOLN
0.4000 mg | Freq: Once | INTRAVENOUS | Status: AC
Start: 2014-10-18 — End: 2014-10-18
  Administered 2014-10-18: 0.4 mg via INTRAVENOUS

## 2014-10-18 MED ORDER — TECHNETIUM TC 99M SESTAMIBI GENERIC - CARDIOLITE
33.0000 | Freq: Once | INTRAVENOUS | Status: AC | PRN
  Administered 2014-10-18: 33 via INTRAVENOUS

## 2014-10-18 NOTE — Progress Notes (Signed)
Echo performed. 

## 2014-10-18 NOTE — Progress Notes (Signed)
[facility] [address] [place], [place] [address] [phone]    Cardiology Nuclear Med Study  Beth Hubbard is a 68 y.o. female     MRN : [id number]     DOB: 07/30/1946  Procedure Date: 10/18/2014  Nuclear Med Background Indication for Stress Test:  Evaluation for Ischemia History:  No known CAD Cardiac Risk Factors: Hypertension, Lipids and NIDDM  Symptoms:  Chest Pain, DOE and Palpitations   Nuclear Pre-Procedure Caffeine/Decaff Intake:  None NPO After: 7:00pm   Lungs:  clear O2 Sat: 96% on room air. IV 0.9% NS with Angio Cath:  22g  IV Site: R Hand  IV Started by:  Matilde Haymaker, RN  Chest Size (in):  36 Cup Size: C  Height: 5\' 2"  (1.575 m)  Weight:  144 lb (65.318 kg)  BMI:  Body mass index is 26.33 kg/(m^2). Tech Comments:  n/a    Nuclear Med Study 1 or 2 day study: 1 day  Stress Test Type:  Lexiscan  Reading MD: n/a  Order Authorizing Provider:  Tressia Miners Turner,MD  Resting Radionuclide: Technetium 3m Sestamibi  Resting Radionuclide Dose: 11.0 mCi   Stress Radionuclide:  Technetium 83m Sestamibi  Stress Radionuclide Dose: 33.0 mCi           Stress Protocol Rest HR: 75 Stress HR: 106  Rest BP: 154/76 Stress BP: 156/63  Exercise Time (min): n/a METS: n/a   Predicted Max HR: 151 bpm % Max HR: 70.2 bpm Rate Pressure Product: 17384   Dose of Adenosine (mg):  n/a Dose of Lexiscan: 0.4 mg  Dose of Atropine (mg): n/a Dose of Dobutamine: n/a mcg/kg/min (at max HR)  Stress Test Technologist: Glade Lloyd, BS-ES  Nuclear Technologist:  Earl Many, CNMT     Rest Procedure:  Myocardial perfusion imaging was performed at rest 45 minutes following the intravenous administration of Technetium 43m Sestamibi. Rest ECG: NSR - Normal EKG  Stress Procedure:  The patient received IV Lexiscan 0.4 mg over 15-seconds.  Technetium 58m Sestamibi injected at 30-seconds.  Quantitative spect images were obtained after a 45 minute delay.   During the infusion of Lexiscan the patient complained of stomach ache, chest tightness and a severe headache.  The headache did not subside so 75 aminophylline was given.  The patient began to feel better within a few minutes.  Stress ECG: No significant change from baseline ECG.  She had occasional PVCs.   QPS Raw Data Images:  Normal; no motion artifact; normal heart/lung ratio. Stress Images:  Normal homogeneous uptake in all areas of the myocardium. Rest Images:  Normal homogeneous uptake in all areas of the myocardium. Subtraction (SDS):  No evidence of ischemia. Transient Ischemic Dilatation (Normal <1.22):  1.05 Lung/Heart Ratio (Normal <0.45):  0.20  Quantitative Gated Spect Images QGS EDV:  76 ml QGS ESV:  24 ml  Impression Exercise Capacity:  Lexiscan with no exercise. BP Response:  Normal blood pressure response. Clinical Symptoms:  No significant symptoms noted. ECG Impression:  No significant ST segment change suggestive of ischemia. Comparison with Prior Nuclear Study: No previous nuclear study performed  Overall Impression:  Normal stress nuclear study.   No evidence of ischemia.  Normal LV function.   LV Ejection Fraction:68%.  No segmental wall motion abnormalities.     Thayer Headings, Brooke Bonito., MD, [facility] 10/18/2014, 5:00 PM [address]  [place] Pager [phone]

## 2014-10-20 ENCOUNTER — Other Ambulatory Visit: Payer: Self-pay | Admitting: *Deleted

## 2014-10-20 DIAGNOSIS — R0602 Shortness of breath: Secondary | ICD-10-CM

## 2014-10-20 DIAGNOSIS — I340 Nonrheumatic mitral (valve) insufficiency: Secondary | ICD-10-CM

## 2014-10-20 NOTE — Progress Notes (Signed)
See echo for order request.

## 2014-10-20 NOTE — Progress Notes (Signed)
Refer to echo result note verbalizing bnp order.

## 2014-10-21 ENCOUNTER — Other Ambulatory Visit (INDEPENDENT_AMBULATORY_CARE_PROVIDER_SITE_OTHER): Payer: Medicare Other | Admitting: *Deleted

## 2014-10-21 DIAGNOSIS — R0602 Shortness of breath: Secondary | ICD-10-CM

## 2014-10-21 LAB — BRAIN NATRIURETIC PEPTIDE: Pro B Natriuretic peptide (BNP): 14 pg/mL (ref 0.0–100.0)

## 2014-10-22 DIAGNOSIS — M654 Radial styloid tenosynovitis [de Quervain]: Secondary | ICD-10-CM | POA: Diagnosis not present

## 2014-10-25 ENCOUNTER — Telehealth: Payer: Self-pay | Admitting: Cardiology

## 2014-10-25 NOTE — Telephone Encounter (Signed)
New Message ° ° ° ° ° ° ° ° °Pt's daughter calling to get pt's lab results. Please call back and advise. °

## 2014-10-25 NOTE — Telephone Encounter (Signed)
Spoke w/daughter and explained lab and echo results.

## 2014-10-29 ENCOUNTER — Telehealth: Payer: Self-pay | Admitting: Cardiology

## 2014-10-29 NOTE — Telephone Encounter (Signed)
New Msg    Pt calling, states she would like to know if there was a medication for a leakage in her heart. Please contact at [phone]

## 2014-10-29 NOTE — Telephone Encounter (Signed)
Attempted to call the contact # left below- busy x 2 tries. Will call back.

## 2014-11-01 NOTE — Telephone Encounter (Signed)
Call received from [place] in the [place] office. The patient had some how contacted that office. They were calling with the correct contact # for the patient. It is [phone]. Will forward to [place].

## 2014-11-01 NOTE — Telephone Encounter (Signed)
Pt st she has had an intermittent sharp pain under her L breast for 1.5 weeks. Worst 7/10 pain. She also has an intermittent tightness in her chest. She st she gets SOB when she talks too much.  BP now 120/100, and she cannot tell her HR.   Spoke with Melina Copa, who recommends the patient come in tomorrow to see Nell Range.  Spoke with pt's daughter, who agrees to bring her in at 1330 tomorrow.

## 2014-11-02 ENCOUNTER — Encounter: Payer: Self-pay | Admitting: Physician Assistant

## 2014-11-02 ENCOUNTER — Ambulatory Visit (INDEPENDENT_AMBULATORY_CARE_PROVIDER_SITE_OTHER): Payer: Medicare Other | Admitting: Physician Assistant

## 2014-11-02 VITALS — BP 110/80 | HR 65 | Ht 62.0 in | Wt 144.0 lb

## 2014-11-02 DIAGNOSIS — R079 Chest pain, unspecified: Secondary | ICD-10-CM | POA: Diagnosis not present

## 2014-11-02 DIAGNOSIS — E785 Hyperlipidemia, unspecified: Secondary | ICD-10-CM | POA: Diagnosis not present

## 2014-11-02 DIAGNOSIS — H0289 Other specified disorders of eyelid: Secondary | ICD-10-CM | POA: Diagnosis not present

## 2014-11-02 LAB — TROPONIN I: TNIDX: 0 ug/l (ref 0.00–0.06)

## 2014-11-02 LAB — D-DIMER, QUANTITATIVE: D-Dimer, Quant: 0.35 ug/mL-FEU (ref 0.00–0.48)

## 2014-11-02 NOTE — Patient Instructions (Addendum)
Your physician has recommended you make the following change in your medication:  1) STOP Simvastatin and continue Crestor  Take all other medications as prescribed  Lab Today: D-dimer, Troponin  Your physician recommends that you return for a FASTING lipid profile and lft on 11/03/14  Your physician wants you to follow-up in: 6 months with Dr.Turner You will receive a reminder letter in the mail two months in advance. If you don't receive a letter, please call our office to schedule the follow-up appointment.

## 2014-11-02 NOTE — Progress Notes (Signed)
Cardiology Office Note    Date:  11/02/2014   ID:  Taronda, Comacho 04/15/1946, MRN [id number]  PCP:  Elyn Peers, MD  Cardiologist:  Dr. Radford Pax     History of Present Illness: Beth Hubbard is a 68 y.o. female DM, HLD, previous TIA and HTN who was added on to the clinic schedule today for evaluation of intermittent sharp pain under her L breast for 1.5 weeks and chest tightness.   She was recently seen by Dr. Radford Pax in 09/2014 for evaluation of OSA and SOB. She has been set up for a sleep study early next year. She underwent a Lexiscan myoview and 2D ECHO for further evaluation of DOE. ECHO returned with normal LV function and some mod eccentric MR. Nuclear perfusion study was normal. A follow up proBNP was ordered in the setting of MR, which returned normal.   Today she complains of sharp, intermittant chest pain that is worse with inspiration, reaching her arm overhead, talking fast and exertion. The chest pain is underneath her left breast and stabbing. It lasts only seconds to minutes and occurs multiple times a day. This chest pain has been going on for about a year but she thinks worse over the past 1.5 weeks. She is very anxious and worries about her health a lot. She has some mild SOB but not associated with the chest pain. No radiation, diaphoresis or nausea. No LE edema, orthopnea or PND.  Studies:  - Echo (10/18/2014): EF 60-65%; mild LVH, no RWMA, G1DD. Mild calcified MV. Mod eccentric MR. Mild LA dilation. TV: Peak RV-RA grad 74mmHg.  - Nuclear (10/19/14): Normal nuclear study. No evidence of ischemia. EF 68%. No segmental wall abnormalities.   - Carotid US (2009): No evidence of HD significant stenosis. Minimal non calcified plaque in the carotid bulb bilaterally. Antegrade flow in both vertebral arteries.    Recent Labs/Images:   Recent Labs  05/18/14 1022 10/21/14 0944  NA 143  --   K 3.2*  --   BUN 16  --   CREATININE 0.99  --   ALT 39*  --   HGB 13.8  --    PROBNP  --  14.0     Dg Wrist Complete Left  08/31/2014   CLINICAL DATA:  Fall 2 weeks ago  EXAM: LEFT WRIST - COMPLETE 3+ VIEW  COMPARISON:  None.  FINDINGS: Negative for fracture or dislocation.  Mild degenerative change in the radiocarpal joint with mild joint space narrowing. No significant spurring.  IMPRESSION: Negative for fracture.   Electronically Signed   By: Franchot Gallo M.D.   On: 08/31/2014 11:28   Dg Shoulder Left  08/31/2014   CLINICAL DATA:  Left shoulder pain  EXAM: LEFT SHOULDER - 2+ VIEW  COMPARISON:  03/28/2006  FINDINGS: Interval rotator cuff surgery. Surgical anchor in the proximal humerus. No significant impingement. Mild degenerative change in the [facility] joint. No fracture.  IMPRESSION: Postop rotator cuff repair.  No acute abnormality.   Electronically Signed   By: Franchot Gallo M.D.   On: 08/31/2014 11:27   Dg Foot Complete Right  08/31/2014   CLINICAL DATA:  68 year old female fell outside on concrete 2 weeks ago with continued right foot pain. Initial encounter.  EXAM: RIGHT FOOT COMPLETE - 3+ VIEW  COMPARISON:  None.  FINDINGS: Bone mineralization is within normal limits for age. Calcaneus intact with degenerative spurring. Joint spaces and alignment preserved. Metatarsals intact. Accessory ossicle adjacent to the cuboid. On the  AP view there is curvilinear lucency through the head of the fifth proximal phalanx suspicious for nondisplaced fracture. This is not correlated on the other views. Other phalanges appear intact.  IMPRESSION: 1. Appearance suspicious for nondisplaced fracture at the head of the left fifth proximal phalanx, involving the low fifth PIP. Correlate for point tenderness which would corroborate a fracture. 2. No other acute fracture or dislocation identified about the right foot.   Electronically Signed   By: Lars Pinks M.D.   On: 08/31/2014 11:28     Wt Readings from Last 3 Encounters:  11/02/14 144 lb (65.318 kg)  10/18/14 144 lb (65.318 kg)    10/07/14 141 lb (63.957 kg)     Past Medical History  Diagnosis Date  . Shingles   . TIA (transient ischemic attack)   . Hypertension   . Diabetes mellitus without complication   . High cholesterol     Current Outpatient Prescriptions  Medication Sig Dispense Refill  . amLODipine (NORVASC) 5 MG tablet Take 5 mg by mouth daily.    . Brinzolamide-Brimonidine 1-0.2 % SUSP Apply to eye.    Marland Kitchen dexamethasone (DECADRON) 4 MG tablet Take 4 mg by mouth daily.  0  . gabapentin (NEURONTIN) 100 MG capsule Take 100-200 mg by mouth 2 (two) times daily. Take 100 mg every morning and 200 mg every night at bedtime.    Marland Kitchen HYDROcodone-acetaminophen (NORCO) 10-325 MG per tablet Take 1 tablet by mouth every 8 (eight) hours as needed (pain.).    Marland Kitchen rosuvastatin (CRESTOR) 20 MG tablet Take 20 mg by mouth daily.    . simvastatin (ZOCOR) 40 MG tablet Take 40 mg by mouth at bedtime.  0  . sitaGLIPtin (JANUVIA) 100 MG tablet Take 100 mg by mouth daily.    . sulindac (CLINORIL) 200 MG tablet Take 200 mg by mouth 2 (two) times daily.     No current facility-administered medications for this visit.     Allergies:   Aspirin; Penicillins; and Nsaids   Social History:  The patient  reports that she has never smoked. She does not have any smokeless tobacco history on file. She reports that she does not drink alcohol or use illicit drugs.   Family History:  The patient's family history includes Cancer in her brother, father, and sister.   ROS:  Please see the history of present illness.  All other systems reviewed and negative.    PHYSICAL EXAM: VS:  BP 110/80 mmHg  Pulse 65  Ht 5\' 2"  (1.575 m)  Wt 144 lb (65.318 kg)  BMI 26.33 kg/m2 Well nourished, well developed, in no acute distress HEENT: normal Neck: no JVD Cardiac:  normal S1, S2; RRR; no murmur Lungs:  clear to auscultation bilaterally, no wheezing, rhonchi or rales Abd: soft, nontender, no hepatomegaly Ext: no edema Skin: warm and dry Neuro:   CNs 2-12 intact, no focal abnormalities noted  EKG:  NSR HR 65.        ASSESSMENT AND PLAN:  Beth Hubbard is a 68 y.o. female DM, HLD, previous TIA and HTN who was added on to the clinic schedule today for evaluation of intermittent sharp pain under her L breast for 1.5 weeks and chest tightness.    Chest pain- atypical. Low suspicion for cardiac chest pain with recent normal nuclear stress test.  -- Sounds more MSK, but will order a D-Dimer and POC troponin. -- If both of the above negative will have her follow with her PCP for  MSK pain.   MR- no s/s CHF. Recent proBNP normal. Repeat 2D ECHO scheduled in may 2016  HLD- patient takes two statins (simvastatin 40mg  in the AM and Rosuvastatin 20mg  in the PM) patient said she is supposed to take both daily. We recommend that she only take one and she will continue taking only Crestor. -- She requested that she have her lipids checked today. Previously followed by PCP but recently switched providers and hasn't had checked for a while -- Will do fasting lipids tomorrow AM with LFTs  HTN- blood pressure well controlled today.  -- Continue amlodipine 5mg   Disposition:  FU with Turner in 6 months.   Signed, Vesta Mixer, PA-C, [facility] 11/02/2014 2:18 PM    [facility] [place], [place], [place]  [address] Phone: [phone]; Fax: [phone]

## 2014-11-03 ENCOUNTER — Telehealth: Payer: Self-pay | Admitting: *Deleted

## 2014-11-03 ENCOUNTER — Other Ambulatory Visit (INDEPENDENT_AMBULATORY_CARE_PROVIDER_SITE_OTHER): Payer: Medicare Other | Admitting: *Deleted

## 2014-11-03 DIAGNOSIS — E785 Hyperlipidemia, unspecified: Secondary | ICD-10-CM

## 2014-11-03 LAB — HEPATIC FUNCTION PANEL
ALT: 14 U/L (ref 0–35)
AST: 18 U/L (ref 0–37)
Albumin: 4.2 g/dL (ref 3.5–5.2)
Alkaline Phosphatase: 50 U/L (ref 39–117)
BILIRUBIN DIRECT: 0.1 mg/dL (ref 0.0–0.3)
TOTAL PROTEIN: 7.4 g/dL (ref 6.0–8.3)
Total Bilirubin: 0.6 mg/dL (ref 0.2–1.2)

## 2014-11-03 LAB — LIPID PANEL
CHOLESTEROL: 198 mg/dL (ref 0–200)
HDL: 63.3 mg/dL (ref >39.00)
LDL CALC: 100 mg/dL — AB (ref 0–99)
NonHDL: 134.7
TRIGLYCERIDES: 175 mg/dL — AB (ref 0.0–149.0)
Total CHOL/HDL Ratio: 3
VLDL: 35 mg/dL (ref 0.0–40.0)

## 2014-11-23 DIAGNOSIS — H578 Other specified disorders of eye and adnexa: Secondary | ICD-10-CM | POA: Diagnosis not present

## 2014-12-09 ENCOUNTER — Ambulatory Visit: Payer: Medicare Other | Admitting: Internal Medicine

## 2014-12-20 DIAGNOSIS — M5412 Radiculopathy, cervical region: Secondary | ICD-10-CM | POA: Diagnosis not present

## 2014-12-22 ENCOUNTER — Other Ambulatory Visit: Payer: Self-pay | Admitting: Orthopedic Surgery

## 2014-12-22 DIAGNOSIS — M542 Cervicalgia: Secondary | ICD-10-CM

## 2014-12-26 ENCOUNTER — Encounter (HOSPITAL_BASED_OUTPATIENT_CLINIC_OR_DEPARTMENT_OTHER): Payer: Medicare Other

## 2014-12-28 ENCOUNTER — Ambulatory Visit
Admission: RE | Admit: 2014-12-28 | Discharge: 2014-12-28 | Disposition: A | Discharge status: Home / Self Care | Payer: Medicare Other | Source: Ambulatory Visit | Attending: Orthopedic Surgery | Admitting: Orthopedic Surgery

## 2014-12-28 ENCOUNTER — Other Ambulatory Visit: Payer: Medicare Other

## 2014-12-28 DIAGNOSIS — M5021 Other cervical disc displacement,  high cervical region: Secondary | ICD-10-CM | POA: Diagnosis not present

## 2014-12-28 DIAGNOSIS — M5031 Other cervical disc degeneration,  high cervical region: Secondary | ICD-10-CM | POA: Diagnosis not present

## 2014-12-28 DIAGNOSIS — M47813 Spondylosis without myelopathy or radiculopathy, cervicothoracic region: Secondary | ICD-10-CM | POA: Diagnosis not present

## 2014-12-28 DIAGNOSIS — M542 Cervicalgia: Secondary | ICD-10-CM

## 2015-01-20 ENCOUNTER — Ambulatory Visit (INDEPENDENT_AMBULATORY_CARE_PROVIDER_SITE_OTHER): Payer: Medicare Other | Admitting: Internal Medicine

## 2015-01-20 VITALS — BP 147/61 | HR 78 | Temp 98.8°F | Resp 14 | Ht 62.0 in | Wt 150.0 lb

## 2015-01-20 DIAGNOSIS — E119 Type 2 diabetes mellitus without complications: Secondary | ICD-10-CM | POA: Diagnosis not present

## 2015-01-20 DIAGNOSIS — I10 Essential (primary) hypertension: Secondary | ICD-10-CM | POA: Diagnosis not present

## 2015-01-20 LAB — COMPREHENSIVE METABOLIC PANEL
ALBUMIN: 4.7 g/dL (ref 3.5–5.2)
ALT: 15 U/L (ref 0–35)
AST: 18 U/L (ref 0–37)
Alkaline Phosphatase: 58 U/L (ref 39–117)
BUN: 16 mg/dL (ref 6–23)
CO2: 31 mEq/L (ref 19–32)
Calcium: 9.6 mg/dL (ref 8.4–10.5)
Chloride: 102 mEq/L (ref 96–112)
Creat: 0.87 mg/dL (ref 0.50–1.10)
GLUCOSE: 80 mg/dL (ref 70–99)
POTASSIUM: 3.4 meq/L — AB (ref 3.5–5.3)
Sodium: 143 mEq/L (ref 135–145)
TOTAL PROTEIN: 7.2 g/dL (ref 6.0–8.3)
Total Bilirubin: 0.3 mg/dL (ref 0.2–1.2)

## 2015-01-20 LAB — HEMOGLOBIN A1C
Hgb A1c MFr Bld: 6.2 % — ABNORMAL HIGH (ref <5.7)
Mean Plasma Glucose: 131 mg/dL — ABNORMAL HIGH (ref <117)

## 2015-01-20 MED ORDER — GABAPENTIN 100 MG PO CAPS: 100.0000 mg | ORAL_CAPSULE | Freq: Two times a day (BID) | ORAL | Status: DC

## 2015-01-20 MED ORDER — ROSUVASTATIN CALCIUM 20 MG PO TABS: 20.0000 mg | ORAL_TABLET | Freq: Every day | ORAL | Status: DC

## 2015-01-20 MED ORDER — SITAGLIPTIN PHOSPHATE 100 MG PO TABS: 100.0000 mg | ORAL_TABLET | Freq: Every day | ORAL | Status: DC

## 2015-01-20 MED ORDER — AMLODIPINE BESYLATE 5 MG PO TABS: 5.0000 mg | ORAL_TABLET | Freq: Every day | ORAL | Status: DC

## 2015-01-20 NOTE — Progress Notes (Signed)
Patient ID: Beth Hubbard, female   DOB: 01-Mar-1946, 69 y.o.   MRN: [id number]   Beth Hubbard, is a 69 y.o. female  [id number]  [id number]  DOB - [birth year]  CC:  Chief Complaint  Patient presents with  . Establish Care    NEEDS REFILLS ON MEDICATION        HPI: Beth Hubbard is a 69 y.o. female here today to establish medical care. She has a H/O DM II, HTN, MR and Arthritis. She was previously seen for Primary Care by Dr. Ardith Dark is also seen by Dr. Tressia Miners turner and Dr. Marily Memos. She takes Norvasc for HTN, Januvia for DM II,  and Crestor for hyperlipidemia. She is also on Dexamethasone ordered by Dr. Eulas Post however the diagnosis is unclear.  Per her medications it appears that she has glaucoma  Patient has No headache, No chest pain, No abdominal pain - No Nausea, No new weakness tingling or numbness, No Cough - SOB.  Allergies  Allergen Reactions  . Aspirin     Tremor   . Penicillins Itching and Other (See Comments)    Reaction: itching,headache, dizziness and anxiety. Feels like she is "goin off"  . Nsaids Anxiety    Reaction: causes dizziness and headache. Ibuprofen. Aspirin   Past Medical History  Diagnosis Date  . Shingles   . TIA (transient ischemic attack)   . Hypertension   . Diabetes mellitus without complication   . High cholesterol    Current Outpatient Prescriptions on File Prior to Visit  Medication Sig Dispense Refill  . Brinzolamide-Brimonidine 1-0.2 % SUSP Apply to eye.    Marland Kitchen dexamethasone (DECADRON) 4 MG tablet Take 4 mg by mouth daily.  0  . HYDROcodone-acetaminophen (NORCO) 10-325 MG per tablet Take 1 tablet by mouth every 8 (eight) hours as needed (pain.).     No current facility-administered medications on file prior to visit.   Family History  Problem Relation Age of Onset  . Cancer Father   . Cancer Sister   . Cancer Brother    History   Social History  . Marital Status: Legally Separated    Spouse Name: N/A  . Number of  Children: N/A  . Years of Education: N/A   Occupational History  . Not on file.   Social History Main Topics  . Smoking status: Never Smoker   . Smokeless tobacco: Not on file  . Alcohol Use: No  . Drug Use: No  . Sexual Activity: Not on file   Other Topics Concern  . Not on file   Social History Narrative    Review of Systems: Constitutional: Negative for fever, chills, diaphoresis, activity change, appetite change and fatigue. HENT: Negative for ear pain, nosebleeds, congestion, facial swelling, rhinorrhea, neck pain, neck stiffness and ear discharge.  Eyes: Negative for pain, discharge, redness, itching and visual disturbance. Respiratory: Negative for cough, choking, chest tightness, shortness of breath, wheezing and stridor.  Cardiovascular: Negative for chest pain, palpitations and leg swelling. Gastrointestinal: Negative for abdominal distention. Genitourinary: Negative for dysuria, urgency, frequency, hematuria, flank pain, decreased urine volume, difficulty urinating and dyspareunia.  Musculoskeletal: Negative for back pain, joint swelling, arthralgia and gait problem. Neurological: Negative for dizziness, tremors, seizures, syncope, facial asymmetry, speech difficulty, weakness, light-headedness, numbness and headaches.  Hematological: Negative for adenopathy. Does not bruise/bleed easily. Psychiatric/Behavioral: Negative for hallucinations, behavioral problems, confusion, dysphoric mood, decreased concentration and agitation.     Objective:   Filed Vitals:   01/20/15 1429  BP:  147/61  Pulse: 78  Temp: 98.8 F (37.1 C)  Resp: 14    Physical Exam: Constitutional: Patient appears well-developed and well-nourished. No distress. HENT: Normocephalic, atraumatic, External right and left ear normal. Oropharynx is clear and moist.  Eyes: Conjunctivae and EOM are normal. PERRLA, no scleral icterus. Neck: Normal ROM. Neck supple. No JVD. No tracheal deviation. No  thyromegaly. CVS: RRR, S1/S2 +, no murmurs, no gallops, no carotid bruit.  Pulmonary: Effort and breath sounds normal, no stridor, rhonchi, wheezes, rales.  Abdominal: Soft. BS +, no distension, tenderness, rebound or guarding.  Musculoskeletal: Normal range of motion. No edema and no tenderness.  Lymphadenopathy: No lymphadenopathy noted, cervical, inguinal or axillary Neuro: Alert. Normal reflexes, muscle tone coordination. No cranial nerve deficit. Skin: Skin is warm and dry. No rash noted. Not diaphoretic. No erythema. No pallor. Psychiatric: Normal mood and affect. Behavior, judgment, thought content normal. Genitalia: External genitalia normal. Vaginal vault shows healthy tissue on visual inspection. No foul smelling discharge. Pt exhibits good lubrication with physiological discharge. No adnexal tenderness noted however there is a soft tissue mass felt on the anterior left vaginal vault.   Lab Results  Component Value Date   WBC 10.3 05/18/2014   HGB 13.8 05/18/2014   HCT 40.5 05/18/2014   MCV 94.6 05/18/2014   PLT 239 05/18/2014   Lab Results  Component Value Date   CREATININE 0.99 05/18/2014   BUN 16 05/18/2014   NA 143 05/18/2014   K 3.2* 05/18/2014   CL 99 05/18/2014   CO2 29 05/18/2014    Lab Results  Component Value Date   HGBA1C * 02/06/2011    6.5 (NOTE)                                                                       According to the ADA Clinical Practice Recommendations for 2011, when HbA1c is used as a screening test:   >=6.5%   Diagnostic of Diabetes Mellitus           (if abnormal result  is confirmed)  5.7-6.4%   Increased risk of developing Diabetes Mellitus  References:Diagnosis and Classification of Diabetes Mellitus,Diabetes UXNA,3557,32(KGURK 1):S62-S69 and Standards of Medical Care in         Diabetes - 2011,Diabetes Care,2011,34  (Suppl 1):S11-S61.   Lipid Panel     Component Value Date/Time   CHOL 198 11/03/2014 0938   TRIG 175.0* 11/03/2014  0938   HDL 63.30 11/03/2014 0938   CHOLHDL 3 11/03/2014 0938   VLDL 35.0 11/03/2014 0938   LDLCALC 100* 11/03/2014 0938       Assessment and plan:   1. Diabetes mellitus without complication - Pt currently on Insulin Sensitizing agent (Thioglidizone). She reports BS of <120 fasting. Will check Hb A1c and also check for micro albumin to assess for diabetes associated renal pathology.  - Hemoglobin A1C - Microalbumin, urine - Obtain medical records  2. Essential hypertension - Pt is currently on a single agent for HTN. Her BP is marginal and not at goal for DM. Will check her BP over several readings and make a decision about either increasing Norvasc to 10 mg or adding HCTZ. - Comprehensive metabolic panel - Microalbumin, urine - Obtain medical records  Return in about 4 weeks (around 02/17/2015).  The patient was given clear instructions to go to ER or return to medical center if symptoms don't improve, worsen or new problems develop. The patient verbalized understanding. The patient was told to call to get lab results if they haven't heard anything in the next week.     This note has been created with Surveyor, quantity. Any transcriptional errors are unintentional.    Saloma Cadena A., MD [place], [place]   01/20/2015, 3:17 PM

## 2015-01-21 ENCOUNTER — Encounter: Payer: Self-pay | Admitting: Internal Medicine

## 2015-01-21 LAB — MICROALBUMIN, URINE: MICROALB UR: 2.4 mg/dL — AB (ref <2.0)

## 2015-01-28 ENCOUNTER — Encounter: Payer: Self-pay | Admitting: Cardiology

## 2015-01-31 DIAGNOSIS — M5020 Other cervical disc displacement, unspecified cervical region: Secondary | ICD-10-CM | POA: Diagnosis not present

## 2015-02-24 ENCOUNTER — Ambulatory Visit (INDEPENDENT_AMBULATORY_CARE_PROVIDER_SITE_OTHER): Payer: Medicare Other | Admitting: Internal Medicine

## 2015-02-24 VITALS — BP 127/52 | HR 75 | Temp 98.3°F | Resp 16 | Ht 62.0 in | Wt 152.0 lb

## 2015-02-24 DIAGNOSIS — Z13820 Encounter for screening for osteoporosis: Secondary | ICD-10-CM

## 2015-02-24 DIAGNOSIS — L299 Pruritus, unspecified: Secondary | ICD-10-CM

## 2015-02-24 DIAGNOSIS — G4710 Hypersomnia, unspecified: Secondary | ICD-10-CM

## 2015-02-24 DIAGNOSIS — Z780 Asymptomatic menopausal state: Secondary | ICD-10-CM

## 2015-02-24 DIAGNOSIS — E7800 Pure hypercholesterolemia, unspecified: Secondary | ICD-10-CM

## 2015-02-24 DIAGNOSIS — E119 Type 2 diabetes mellitus without complications: Secondary | ICD-10-CM

## 2015-02-24 DIAGNOSIS — I10 Essential (primary) hypertension: Secondary | ICD-10-CM | POA: Diagnosis not present

## 2015-02-24 DIAGNOSIS — Z23 Encounter for immunization: Secondary | ICD-10-CM

## 2015-02-24 DIAGNOSIS — G4719 Other hypersomnia: Secondary | ICD-10-CM

## 2015-02-24 MED ORDER — HYDROXYZINE HCL 10 MG PO TABS: 10.0000 mg | ORAL_TABLET | Freq: Three times a day (TID) | ORAL | Status: DC | PRN

## 2015-02-28 ENCOUNTER — Encounter: Payer: Self-pay | Admitting: Internal Medicine

## 2015-02-28 DIAGNOSIS — G4719 Other hypersomnia: Secondary | ICD-10-CM | POA: Insufficient documentation

## 2015-02-28 MED ORDER — DIPHENHYDRAMINE HCL 25 MG PO CAPS: 25.0000 mg | ORAL_CAPSULE | Freq: Four times a day (QID) | ORAL | Status: DC | PRN

## 2015-02-28 NOTE — Addendum Note (Signed)
Addended by: Liston Alba A on: 02/28/2015 12:47 PM   Modules accepted: Orders, Medications

## 2015-02-28 NOTE — Progress Notes (Signed)
Patient ID: Beth Hubbard, female   DOB: [birth year], 69 y.o.   MRN: [id number] Pt's insurance company requires prior authorization on Atarax. I personally have never prescribed any 1st generation antihistamines with failure. Thus I will prescribe Benadryl.

## 2015-02-28 NOTE — Progress Notes (Signed)
Patient ID: Beth Hubbard, female   DOB: 06/09/1946, 69 y.o.   MRN: [id number]   Beth Hubbard, is a 69 y.o. female  [id number]  [id number]:[id number]  DOB - 16-May-1946  CC:  Chief Complaint  Patient presents with  . Follow-up  . Diabetes  . Hypertension       HPI: Beth Hubbard is a 69 y.o. female here today to follow up on DM II and HTN. On her last visit her BNP was marginal and we discussed diet and exercise versus adding another agent or increasing Norvasc to 10 mg. However her BP is at goal today and her ambulatory BP reflect good control. She also has been having daytime sleepiness and has scored high risk for OSa on the Epworth sleepiness scale. I have discussed a polysomnogram with hte patient and she reports that she was previously referred by Dr. 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 I have discussed the importance of an prompt diagnosis and the complications associated with OSA if she indeed does have it.   Pt has a mildly elevated triglyceride level but her LDL is just at goal. Pt is not interested increasing her medications (Crestor) for cholesterol until it is checked again..   With regard to her preventative care, she states that she had a Colonoscopy at Dr. Lorie Apley office 2 years ago and that she thinks that she may have had a DEXA Scan.   Patient has No headache, No chest pain, No abdominal pain - No Nausea, No new weakness tingling or numbness, No Cough - SOB.  Allergies  Allergen Reactions  . Aspirin     Tremor   . Penicillins Itching and Other (See Comments)    Reaction: itching,headache, dizziness and anxiety. Feels like she is "goin off"  . Nsaids Anxiety    Reaction: causes dizziness and headache. Ibuprofen. Aspirin   Past Medical History  Diagnosis Date  . Shingles   . TIA (transient ischemic attack)   . Hypertension   . Diabetes mellitus without complication   . High cholesterol    Current Outpatient  Prescriptions on File Prior to Visit  Medication Sig Dispense Refill  . amLODipine (NORVASC) 5 MG tablet Take 1 tablet (5 mg total) by mouth daily. 30 tablet 3  . Brinzolamide-Brimonidine 1-0.2 % SUSP Apply to eye.    . gabapentin (NEURONTIN) 100 MG capsule Take 1-2 capsules (100-200 mg total) by mouth 2 (two) times daily. Take 100 mg every morning and 200 mg every night at bedtime. 90 capsule 3  . HYDROcodone-acetaminophen (NORCO) 10-325 MG per tablet Take 1 tablet by mouth every 8 (eight) hours as needed (pain.).    Marland Kitchen rosuvastatin (CRESTOR) 20 MG tablet Take 1 tablet (20 mg total) by mouth daily. 30 tablet 3  . sitaGLIPtin (JANUVIA) 100 MG tablet Take 1 tablet (100 mg total) by mouth daily. 30 tablet 3   No current facility-administered medications on file prior to visit.   Family History  Problem Relation Age of Onset  . Cancer Father   . Cancer Sister   . Cancer Brother    History   Social History  . Marital Status: Legally Separated    Spouse Name: N/A  . Number of Children: N/A  . Years of Education: N/A   Occupational History  . Not on file.   Social History Main Topics  . Smoking status: Never Smoker   . Smokeless tobacco: Not on file  .  Alcohol Use: No  . Drug Use: No  . Sexual Activity: Not on file   Other Topics Concern  . Not on file   Social History Narrative    Review of Systems: Constitutional: Negative for fever, chills, diaphoresis, activity change, appetite change and fatigue. HENT: Negative for ear pain, nosebleeds, congestion, facial swelling, rhinorrhea, neck pain, neck stiffness and ear discharge.  Eyes: Negative for pain, discharge, redness, itching and visual disturbance. Respiratory: Negative for cough, choking, chest tightness, shortness of breath, wheezing and stridor.  Cardiovascular: Negative for chest pain, palpitations and leg swelling. Gastrointestinal: Negative for abdominal distention. Genitourinary: Negative for dysuria, urgency,  frequency, hematuria, flank pain, decreased urine volume, difficulty urinating and dyspareunia.  Musculoskeletal: Negative for back pain, joint swelling, arthralgia and gait problem. Neurological: Negative for dizziness, tremors, seizures, syncope, facial asymmetry, speech difficulty, weakness, light-headedness, numbness and headaches.  Hematological: Negative for adenopathy. Does not bruise/bleed easily. Psychiatric/Behavioral: Negative for hallucinations, behavioral problems, confusion, dysphoric mood, decreased concentration and agitation.     Objective:   Filed Vitals:   02/24/15 1112  BP: 127/52  Pulse: 75  Temp: 98.3 F (36.8 C)  Resp: 16    Physical Exam: Constitutional: Patient appears well-developed and well-nourished. No distress. HENT: Normocephalic, atraumatic, External right and left ear normal. Oropharynx is clear and moist.  Eyes: Conjunctivae and EOM are normal. PERRLA, no scleral icterus. Neck: Normal ROM. Neck supple. No JVD. No tracheal deviation. No thyromegaly. CVS: RRR, S1/S2 +, no murmurs, no gallops, no carotid bruit.  Pulmonary: Effort and breath sounds normal, no stridor, rhonchi, wheezes, rales.  Abdominal: Soft. BS +, no distension, tenderness, rebound or guarding.  Musculoskeletal: Normal range of motion. No edema and no tenderness.  Lymphadenopathy: No lymphadenopathy noted, cervical, inguinal or axillary Neuro: Alert. Normal reflexes, muscle tone coordination. No cranial nerve deficit. Skin: Skin is warm and dry. No rash noted. Not diaphoretic. No erythema. No pallor. Psychiatric: Normal mood and affect. Behavior, judgment, thought content normal.   Lab Results  Component Value Date   WBC 10.3 05/18/2014   HGB 13.8 05/18/2014   HCT 40.5 05/18/2014   MCV 94.6 05/18/2014   PLT 239 05/18/2014   Lab Results  Component Value Date   CREATININE 0.87 01/20/2015   BUN 16 01/20/2015   NA 143 01/20/2015   K 3.4* 01/20/2015   CL 102 01/20/2015   CO2  31 01/20/2015    Lab Results  Component Value Date   HGBA1C 6.2* 01/20/2015   Lipid Panel     Component Value Date/Time   CHOL 198 11/03/2014 0938   TRIG 175.0* 11/03/2014 0938   HDL 63.30 11/03/2014 0938   CHOLHDL 3 11/03/2014 0938   VLDL 35.0 11/03/2014 0938   LDLCALC 100* 11/03/2014 0938       Assessment and plan:  1. Diabetes mellitus without complication - Hb U7O <8 and patient doing well with diet and has no complaints of hypoglycemic symptoms. Will continue on Januvia 100 mg daily.  2. Essential hypertension - BP at goal. Continue Naovasc 5 mg daily. She has no evidence of fluid retention.   3. High cholesterol - Pt is currently on Crestor 20 mg. Will recheck Cholesterol in 2 months and discuss any medication adjustments at that time.  4. Osteoporosis screening in Postmenopausal female  - Pt postmenopausal and at high risk for osteoporosis. - HM DEXA SCAN  5.  Excessive daytime sleepiness/Hypersomnia - Pt has been referred for polysomnogram by Dr. Radford Pax. She needs to follow up  with scheduling it.   6.  Itching - Pt complained of itching without a rash. She has used Atarax in the past and is requesting it as she reports that OTC antihistamines have not been effective.  - hydrOXYzine (ATARAX/VISTARIL) 10 MG tablet; Take 1 tablet (10 mg total) by mouth 3 (three) times daily as needed.  Dispense: 30 tablet; Refill: 0   7 Immunization due  - Tdap vaccine greater than or equal to 7yo IM - Pneumococcal conjugate vaccine 13-valent  Return in about 3 months (around 05/26/2015) for DM II, HTN, Hyperlipidemia, Annual Physical.  The patient was given clear instructions to go to ER or return to medical center if symptoms don't improve, worsen or new problems develop. The patient verbalized understanding. The patient was told to call to get lab results if they haven't heard anything in the next week.     This note has been created with Engineer, agricultural. Any transcriptional errors are unintentional.    Carmellia Kreisler A., MD [place], [place]   02/28/2015, 10:13 AM

## 2015-03-14 ENCOUNTER — Other Ambulatory Visit: Payer: Self-pay | Admitting: Orthopedic Surgery

## 2015-03-14 ENCOUNTER — Ambulatory Visit
Admission: RE | Admit: 2015-03-14 | Discharge: 2015-03-14 | Disposition: A | Discharge status: Home / Self Care | Payer: Medicare Other | Source: Ambulatory Visit | Attending: Orthopedic Surgery | Admitting: Orthopedic Surgery

## 2015-03-14 DIAGNOSIS — M5136 Other intervertebral disc degeneration, lumbar region: Secondary | ICD-10-CM

## 2015-03-14 DIAGNOSIS — M47817 Spondylosis without myelopathy or radiculopathy, lumbosacral region: Secondary | ICD-10-CM | POA: Diagnosis not present

## 2015-03-14 DIAGNOSIS — M5430 Sciatica, unspecified side: Secondary | ICD-10-CM | POA: Diagnosis not present

## 2015-03-18 ENCOUNTER — Telehealth: Payer: Self-pay | Admitting: Internal Medicine

## 2015-03-21 ENCOUNTER — Other Ambulatory Visit: Payer: Self-pay | Admitting: Internal Medicine

## 2015-03-21 MED ORDER — AMLODIPINE BESYLATE 5 MG PO TABS: 5.0000 mg | ORAL_TABLET | Freq: Every day | ORAL | Status: DC

## 2015-03-21 MED ORDER — GABAPENTIN 100 MG PO CAPS: 100.0000 mg | ORAL_CAPSULE | Freq: Two times a day (BID) | ORAL | Status: DC

## 2015-03-21 MED ORDER — SITAGLIPTIN PHOSPHATE 100 MG PO TABS: 100.0000 mg | ORAL_TABLET | Freq: Every day | ORAL | Status: DC

## 2015-03-21 MED ORDER — ROSUVASTATIN CALCIUM 20 MG PO TABS
20.0000 mg | ORAL_TABLET | Freq: Every day | ORAL | Status: DC
Start: 2015-03-21 — End: 2015-11-17

## 2015-03-21 NOTE — Telephone Encounter (Signed)
Refills sent into pharmacy via escript. Thanks!

## 2015-04-04 DIAGNOSIS — M5020 Other cervical disc displacement, unspecified cervical region: Secondary | ICD-10-CM | POA: Diagnosis not present

## 2015-04-07 ENCOUNTER — Telehealth: Payer: Self-pay | Admitting: Internal Medicine

## 2015-04-29 ENCOUNTER — Ambulatory Visit: Payer: Medicare Other | Admitting: Cardiology

## 2015-05-18 ENCOUNTER — Ambulatory Visit (INDEPENDENT_AMBULATORY_CARE_PROVIDER_SITE_OTHER): Payer: Medicare Other | Admitting: Cardiology

## 2015-05-18 ENCOUNTER — Encounter: Payer: Self-pay | Admitting: Cardiology

## 2015-05-18 VITALS — BP 134/58 | HR 68 | Ht 62.0 in | Wt 151.8 lb

## 2015-05-18 DIAGNOSIS — I10 Essential (primary) hypertension: Secondary | ICD-10-CM | POA: Diagnosis not present

## 2015-05-18 DIAGNOSIS — I340 Nonrheumatic mitral (valve) insufficiency: Secondary | ICD-10-CM | POA: Diagnosis not present

## 2015-05-18 DIAGNOSIS — G4719 Other hypersomnia: Secondary | ICD-10-CM

## 2015-05-18 NOTE — Progress Notes (Signed)
Cardiology Office Note   Date:  05/18/2015   ID:  Beth Hubbard [birth year], MRN [id number]  PCP:  Leana Gamer., MD    Chief Complaint  Patient presents with  . Follow-up    hypersomnia      History of Present Illness: Beth Hubbard is a 69 y.o. female with a history of DM, dyslipidemia and HTN who presents today for followup of sleep apnea. She recently saw me complaining that she awakens with jerking at night that keeps her up. She is not sleeping very well at night. She has nonrestorative sleep and falls asleep easily during the day Her daughter says that she snores and she has noticed her stop breathing while sleeping. 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 She also complains of DOE when walking or talking that she thinks has been getting worse over the past year. She denies any chest pain.  Nuclear stress test showed no ischemia and echo showed normal LVF with moderate MR. BNP was normal.  A sleep study was ordered at her last OV but she cancelled it.      Past Medical History  Diagnosis Date  . Shingles   . TIA (transient ischemic attack)   . Hypertension   . Diabetes mellitus without complication   . High cholesterol   . Mitral regurgitation     moderate by echo 2015  . Excessive daytime sleepiness     Past Surgical History  Procedure Laterality Date  . Abdominal surgery    . Rotator cuff repair      bilateral     Current Outpatient Prescriptions  Medication Sig Dispense Refill  . ACCU-CHEK AVIVA PLUS test strip     . ACCU-CHEK SOFTCLIX LANCETS lancets     . amLODipine (NORVASC) 5 MG tablet Take 1 tablet (5 mg total) by mouth daily. 30 tablet 3  . COMBIGAN 0.2-0.5 % ophthalmic solution Place 1 drop into the left eye 2 (two) times daily.    Marland Kitchen dexamethasone (DECADRON) 4 MG tablet Take 4 mg by mouth daily.    . folic acid  (FOLVITE) 518 MCG tablet Take 400 mcg by mouth daily.    Marland Kitchen gabapentin (NEURONTIN) 100 MG capsule Take 1-2 capsules (100-200 mg total) by mouth 2 (two) times daily. Take 100 mg every morning and 200 mg every night at bedtime. 90 capsule 3  . HYDROcodone-acetaminophen (NORCO) 10-325 MG per tablet Take 1 tablet by mouth every 8 (eight) hours as needed (pain.).    Marland Kitchen rosuvastatin (CRESTOR) 20 MG tablet Take 1 tablet (20 mg total) by mouth daily. 30 tablet 3  . simvastatin (ZOCOR) 40 MG tablet Take 40 mg by mouth at bedtime.    . sitaGLIPtin (JANUVIA) 100 MG tablet Take 1 tablet (100 mg total) by mouth daily. 30 tablet 3  . sulindac (CLINORIL) 200 MG tablet Take 200 mg by mouth 2 (two) times daily.     No current facility-administered medications for this visit.    Allergies:   Aspirin; Penicillins; and Nsaids    Social History:  The patient  reports that she has never smoked. She does not have any smokeless tobacco history on file. She reports that she does not drink alcohol or use illicit drugs.   Family History:  The patient's  family history includes Cancer in her brother, father, and sister.    ROS:  Please see the history of present illness.   Otherwise, review of systems are positive for none.   All other systems are reviewed and negative.    PHYSICAL EXAM: VS:  BP 134/58 mmHg  Pulse 68  Ht 5\' 2"  (1.575 m)  Wt 151 lb 12.8 oz (68.856 kg)  BMI 27.76 kg/m2  SpO2 96% , BMI Body mass index is 27.76 kg/(m^2). GEN: Well nourished, well developed, in no acute distress HEENT: normal Neck: no JVD, carotid bruits, or masses Cardiac: RRR; no murmurs, rubs, or gallops,no edema  Respiratory:  clear to auscultation bilaterally, normal work of breathing GI: soft, nontender, nondistended, + BS MS: no deformity or atrophy Skin: warm and dry, no rash Neuro:  Strength and sensation are intact Psych: euthymic mood, full affect   EKG:  EKG is not ordered today.    Recent Labs: 10/21/2014: Pro  B Natriuretic peptide (BNP) 14.0 01/20/2015: ALT 15; BUN 16; Creat 0.87; Potassium 3.4*; Sodium 143    Lipid Panel    Component Value Date/Time   CHOL 198 11/03/2014 0938   TRIG 175.0* 11/03/2014 0938   HDL 63.30 11/03/2014 0938   CHOLHDL 3 11/03/2014 0938   VLDL 35.0 11/03/2014 0938   LDLCALC 100* 11/03/2014 0938      Wt Readings from Last 3 Encounters:  05/18/15 151 lb 12.8 oz (68.856 kg)  02/24/15 152 lb (68.947 kg)  01/20/15 150 lb (68.04 kg)       ASSESSMENT AND PLAN:  1.  Atypical noncardiac chest pain with no ischemia on nuclear stress test.  She has not had any in a long time but last night had a pinch lasting a second.  No further cardiac workup.   2.  Moderate MR by echo with no evidence of CHF on exam - repeat to assess stability.  3.  HTN - controlled on amlodipine 4.  Dyslipidemia - followed by PCP 5.  Excessive daytime sleepiness - poor sleep with frequent awakenings.  I will set her back up for split night sleep study   Current medicines are reviewed at length with the patient today.  The patient does not have concerns regarding medicines.  The following changes have been made:  no change  Labs/ tests ordered today: See above Assessment and Plan No orders of the defined types were placed in this encounter.     Disposition:   FU with me 1 year   Signed, Sueanne Margarita, MD  05/18/2015 4:09 PM    [facility] [address], [place], [place]  [address] Phone: [phone]; Fax: [phone]

## 2015-05-18 NOTE — Patient Instructions (Signed)
Medication Instructions:  Your physician recommends that you continue on your current medications as directed. Please refer to the Current Medication list given to you today.   Labwork: None  Testing/Procedures: Your physician has requested that you have an echocardiogram. Echocardiography is a painless test that uses sound waves to create images of your heart. It provides your doctor with information about the size and shape of your heart and how well your heart's chambers and valves are working. This procedure takes approximately one hour. There are no restrictions for this procedure.  Your physician has recommended that you have a sleep study. This test records several body functions during sleep, including: brain activity, eye movement, oxygen and carbon dioxide blood levels, heart rate and rhythm, breathing rate and rhythm, the flow of air through your mouth and nose, snoring, body muscle movements, and chest and belly movement.  Follow-Up: Your physician wants you to follow-up in: 1 year with Dr. Radford Pax. You will receive a reminder letter in the mail two months in advance. If you don't receive a letter, please call our office to schedule the follow-up appointment.   Any Other Special Instructions Will Be Listed Below (If Applicable).

## 2015-05-24 DIAGNOSIS — H4011X1 Primary open-angle glaucoma, mild stage: Secondary | ICD-10-CM | POA: Diagnosis not present

## 2015-05-30 ENCOUNTER — Ambulatory Visit (INDEPENDENT_AMBULATORY_CARE_PROVIDER_SITE_OTHER): Payer: Medicare Other | Admitting: Family Medicine

## 2015-05-30 VITALS — BP 128/48 | HR 67 | Temp 99.1°F | Resp 16 | Ht 62.0 in | Wt 151.0 lb

## 2015-05-30 DIAGNOSIS — E119 Type 2 diabetes mellitus without complications: Secondary | ICD-10-CM | POA: Diagnosis not present

## 2015-05-30 LAB — CBC WITH DIFFERENTIAL/PLATELET
Basophils Absolute: 0 10*3/uL (ref 0.0–0.1)
Basophils Relative: 0 % (ref 0–1)
Eosinophils Absolute: 0.1 10*3/uL (ref 0.0–0.7)
Eosinophils Relative: 2 % (ref 0–5)
HCT: 34 % — ABNORMAL LOW (ref 36.0–46.0)
Hemoglobin: 11.3 g/dL — ABNORMAL LOW (ref 12.0–15.0)
Lymphocytes Relative: 36 % (ref 12–46)
Lymphs Abs: 2.3 10*3/uL (ref 0.7–4.0)
MCH: 31.8 pg (ref 26.0–34.0)
MCHC: 33.2 g/dL (ref 30.0–36.0)
MCV: 95.8 fL (ref 78.0–100.0)
MONO ABS: 0.5 10*3/uL (ref 0.1–1.0)
MPV: 10.4 fL (ref 8.6–12.4)
Monocytes Relative: 8 % (ref 3–12)
NEUTROS PCT: 54 % (ref 43–77)
Neutro Abs: 3.4 10*3/uL (ref 1.7–7.7)
Platelets: 230 10*3/uL (ref 150–400)
RBC: 3.55 MIL/uL — ABNORMAL LOW (ref 3.87–5.11)
RDW: 13.7 % (ref 11.5–15.5)
WBC: 6.3 10*3/uL (ref 4.0–10.5)

## 2015-05-30 LAB — COMPLETE METABOLIC PANEL WITH GFR
ALBUMIN: 4.2 g/dL (ref 3.5–5.2)
ALT: 10 U/L (ref 0–35)
AST: 15 U/L (ref 0–37)
Alkaline Phosphatase: 40 U/L (ref 39–117)
BUN: 19 mg/dL (ref 6–23)
CO2: 33 mEq/L — ABNORMAL HIGH (ref 19–32)
Calcium: 9.4 mg/dL (ref 8.4–10.5)
Chloride: 103 mEq/L (ref 96–112)
Creat: 0.95 mg/dL (ref 0.50–1.10)
GFR, Est African American: 71 mL/min
GFR, Est Non African American: 61 mL/min
GLUCOSE: 77 mg/dL (ref 70–99)
Potassium: 3.4 mEq/L — ABNORMAL LOW (ref 3.5–5.3)
Sodium: 146 mEq/L — ABNORMAL HIGH (ref 135–145)
TOTAL PROTEIN: 6.7 g/dL (ref 6.0–8.3)
Total Bilirubin: 0.3 mg/dL (ref 0.2–1.2)

## 2015-05-30 LAB — LIPID PANEL
Cholesterol: 173 mg/dL (ref 0–200)
HDL: 58 mg/dL (ref >=46)
LDL CALC: 77 mg/dL (ref 0–99)
Total CHOL/HDL Ratio: 3 Ratio
Triglycerides: 191 mg/dL — ABNORMAL HIGH (ref <150)
VLDL: 38 mg/dL (ref 0–40)

## 2015-05-30 NOTE — Patient Instructions (Signed)
Call and make and appointment for your mammogram. Your other health mainteance items seem to be up today. Don't forget to get your flu shot in the fall. Follow-up with specialist as planned     DASH Eating Plan DASH stands for "Dietary Approaches to Stop Hypertension." The DASH eating plan is a healthy eating plan that has been shown to reduce high blood pressure (hypertension). Additional health benefits may include reducing the risk of type 2 diabetes mellitus, heart disease, and stroke. The DASH eating plan may also help with weight loss. WHAT DO I NEED TO KNOW ABOUT THE DASH EATING PLAN? For the DASH eating plan, you will follow these general guidelines:  Choose foods with a percent daily value for sodium of less than 5% (as listed on the food label).  Use salt-free seasonings or herbs instead of table salt or sea salt.  Check with your health care provider or pharmacist before using salt substitutes.  Eat lower-sodium products, often labeled as "lower sodium" or "no salt added."  Eat fresh foods.  Eat more vegetables, fruits, and low-fat dairy products.  Choose whole grains. Look for the word "whole" as the first word in the ingredient list.  Choose fish and skinless chicken or [place] more often than red meat. Limit fish, poultry, and meat to 6 oz (170 g) each day.  Limit sweets, desserts, sugars, and sugary drinks.  Choose heart-healthy fats.  Limit cheese to 1 oz (28 g) per day.  Eat more home-cooked food and less restaurant, buffet, and fast food.  Limit fried foods.  Cook foods using methods other than frying.  Limit canned vegetables. If you do use them, rinse them well to decrease the sodium.  When eating at a restaurant, ask that your food be prepared with less salt, or no salt if possible. WHAT FOODS CAN I EAT? Seek help from a dietitian for individual calorie needs. Grains Whole grain or whole wheat bread. Brown rice. Whole grain or whole wheat pasta.  Quinoa, bulgur, and whole grain cereals. Low-sodium cereals. Corn or whole wheat flour tortillas. Whole grain cornbread. Whole grain crackers. Low-sodium crackers. Vegetables Fresh or frozen vegetables (raw, steamed, roasted, or grilled). Low-sodium or reduced-sodium tomato and vegetable juices. Low-sodium or reduced-sodium tomato sauce and paste. Low-sodium or reduced-sodium canned vegetables.  Fruits All fresh, canned (in natural juice), or frozen fruits. Meat and Other Protein Products Ground beef (85% or leaner), grass-fed beef, or beef trimmed of fat. Skinless chicken or [place]. Ground chicken or [place]. Pork trimmed of fat. All fish and seafood. Eggs. Dried beans, peas, or lentils. Unsalted nuts and seeds. Unsalted canned beans. Dairy Low-fat dairy products, such as skim or 1% milk, 2% or reduced-fat cheeses, low-fat ricotta or cottage cheese, or plain low-fat yogurt. Low-sodium or reduced-sodium cheeses. Fats and Oils Tub margarines without trans fats. Light or reduced-fat mayonnaise and salad dressings (reduced sodium). Avocado. Safflower, olive, or canola oils. Natural peanut or almond butter. Other Unsalted popcorn and pretzels. The items listed above may not be a complete list of recommended foods or beverages. Contact your dietitian for more options. WHAT FOODS ARE NOT RECOMMENDED? Grains White bread. White pasta. White rice. Refined cornbread. Bagels and croissants. Crackers that contain trans fat. Vegetables Creamed or fried vegetables. Vegetables in a cheese sauce. Regular canned vegetables. Regular canned tomato sauce and paste. Regular tomato and vegetable juices. Fruits Dried fruits. Canned fruit in light or heavy syrup. Fruit juice. Meat and Other Protein Products Fatty cuts of meat. Ribs, chicken  wings, bacon, sausage, bologna, salami, chitterlings, fatback, hot dogs, bratwurst, and packaged luncheon meats. Salted nuts and seeds. Canned beans with salt. Dairy Whole or 2%  milk, cream, half-and-half, and cream cheese. Whole-fat or sweetened yogurt. Full-fat cheeses or blue cheese. Nondairy creamers and whipped toppings. Processed cheese, cheese spreads, or cheese curds. Condiments Onion and garlic salt, seasoned salt, table salt, and sea salt. Canned and packaged gravies. Worcestershire sauce. Tartar sauce. Barbecue sauce. Teriyaki sauce. Soy sauce, including reduced sodium. Steak sauce. Fish sauce. Oyster sauce. Cocktail sauce. Horseradish. Ketchup and mustard. Meat flavorings and tenderizers. Bouillon cubes. Hot sauce. Tabasco sauce. Marinades. Taco seasonings. Relishes. Fats and Oils Butter, stick margarine, lard, shortening, ghee, and bacon fat. Coconut, palm kernel, or palm oils. Regular salad dressings. Other Pickles and olives. Salted popcorn and pretzels. The items listed above may not be a complete list of foods and beverages to avoid. Contact your dietitian for more information. WHERE CAN I FIND MORE INFORMATION? National Heart, Lung, and Blood Institute: travelstabloid.com Document Released: 10/25/2011 Document Revised: 03/22/2014 Document Reviewed: 09/09/2013 [facility] Patient Information 2015 [place], [place]. This information is not intended to replace advice given to you by your health care provider. Make sure you discuss any questions you have with your health care provider.

## 2015-05-30 NOTE — Progress Notes (Signed)
Patient ID: Beth Hubbard, female   DOB: 08-21-1946, 69 y.o.   MRN: [id number]   Beth Hubbard, is a 69 y.o. female  [id number]  [id number]  DOB - [birth year]  CC:  Chief Complaint  Patient presents with  . Annual Exam       HPI: [name] is a 69 y.o. female here for annual wellness exam. She has a history of TIA, hypertension, diabetes, high cholesterol, mitral regurg. She does have a hisotry of shingles. She has not had a mammogram in over 2 years. She is taking her medications regularly and needs no refills today. She has  A history of low back pain on left with radiation into left leg. She is followed by ortho.  Allergies  Allergen Reactions  . Aspirin     Tremor   . Penicillins Itching and Other (See Comments)    Reaction: itching,headache, dizziness and anxiety. Feels like she is "goin off"  . Nsaids Anxiety    Reaction: causes dizziness and headache. Ibuprofen. Aspirin   Past Medical History  Diagnosis Date  . Shingles   . TIA (transient ischemic attack)   . Hypertension   . Diabetes mellitus without complication   . High cholesterol   . Mitral regurgitation     moderate by echo 2015  . Excessive daytime sleepiness    Current Outpatient Prescriptions on File Prior to Visit  Medication Sig Dispense Refill  . ACCU-CHEK AVIVA PLUS test strip     . ACCU-CHEK SOFTCLIX LANCETS lancets     . amLODipine (NORVASC) 5 MG tablet Take 1 tablet (5 mg total) by mouth daily. 30 tablet 3  . COMBIGAN 0.2-0.5 % ophthalmic solution Place 1 drop into the left eye 2 (two) times daily.    Marland Kitchen dexamethasone (DECADRON) 4 MG tablet Take 4 mg by mouth daily.    . folic acid (FOLVITE) 440 MCG tablet Take 400 mcg by mouth daily.    Marland Kitchen gabapentin (NEURONTIN) 100 MG capsule Take 1-2 capsules (100-200 mg total) by mouth 2 (two) times daily. Take 100 mg every morning and 200 mg every night at bedtime. 90 capsule 3  . HYDROcodone-acetaminophen (NORCO) 10-325 MG per tablet Take 1 tablet by  mouth every 8 (eight) hours as needed (pain.).    Marland Kitchen rosuvastatin (CRESTOR) 20 MG tablet Take 1 tablet (20 mg total) by mouth daily. 30 tablet 3  . simvastatin (ZOCOR) 40 MG tablet Take 40 mg by mouth at bedtime.    . sitaGLIPtin (JANUVIA) 100 MG tablet Take 1 tablet (100 mg total) by mouth daily. 30 tablet 3  . sulindac (CLINORIL) 200 MG tablet Take 200 mg by mouth 2 (two) times daily.     No current facility-administered medications on file prior to visit.   Family History  Problem Relation Age of Onset  . Cancer Father   . Cancer Sister   . Cancer Brother    History   Social History  . Marital Status: Legally Separated    Spouse Name: N/A  . Number of Children: N/A  . Years of Education: N/A   Occupational History  . Not on file.   Social History Main Topics  . Smoking status: Never Smoker   . Smokeless tobacco: Not on file  . Alcohol Use: No  . Drug Use: No  . Sexual Activity: Not on file   Other Topics Concern  . Not on file   Social History Narrative    Review of Systems: Constitutional: Negative for fever,  chills, appetite change, weight loss,  Fatigue. Skin: Negative for rashes or lesions of concern. HENT: Negative for ear pain, ear discharge.nose bleeds Eyes: Negative for pain, discharge, redness. and visual disturbance.Positive for itching and clear drainage that she associated with gabapentin Neck: Positive neck for pain, stiffness since a fall months ago. Respiratory: Negative for cough, shortness of breath,   Cardiovascular: Negative for chest pain, palpitations. Positive for occ. leg swelling. Gastrointestinal: Negative for abdominal pain, nausea, vomiting, diarrhea, constipations Genitourinary: Negative for dysuria, urgency, frequency, hematuria,  Musculoskeletal: Positive for low back pain, feft leg pain joint pain, joint  swelling, and gait problem.Positive for some weakness in her left leg. Sometimes uses a cane. See orthopedist. Neurological:  Negative for dizziness, tremors, seizures, syncope,   light-headedness, numbness. Positive for occ. mild headaches.  Hematological: Negative for easy bruising or bleeding Psychiatric/Behavioral: Negative for depression, anxiety, decreased concentration, confusion. Positive for insomnia.   Objective:   Filed Vitals:   05/30/15 1409  BP: 128/48  Pulse: 67  Temp: 99.1 F (37.3 C)  Resp: 16    Physical Exam: Constitutional: Patient appears well-developed and well-nourished. No distress. HENT: Normocephalic, atraumatic, External right and left ear normal. Oropharynx is clear and moist.  Eyes: Conjunctivae and EOM are normal. PERRLA, no scleral icterus. Neck: Normal ROM. Neck supple. No lymphadenopathy, No thyromegaly. CVS: RRR, S1/S2 +, no gallops, no rubs. Positive for 4/6 murmur (has history of mitral value regurg. Pulmonary: Effort and breath sounds normal, no stridor, rhonchi, wheezes, rales.  Abdominal: Soft. Normoactive BS,, no distension, rebound or guarding. Mild left lower quadrant tenderness. Musculoskeletal: Normal range of motion. No edema and no tenderness. Some difficulty with lying back on stretcher. Has to lie on side and then roll to back. Neuro: Alert.Normal muscle tone coordination. Non-focal Skin: Skin is warm and dry. No rash noted. Not diaphoretic. No erythema. No pallor.Few scattered age spots and seborreaic keratosis. Psychiatric: Normal mood and affect. Behavior, judgment, thought content normal.  Lab Results  Component Value Date   WBC 10.3 05/18/2014   HGB 13.8 05/18/2014   HCT 40.5 05/18/2014   MCV 94.6 05/18/2014   PLT 239 05/18/2014   Lab Results  Component Value Date   CREATININE 0.87 01/20/2015   BUN 16 01/20/2015   NA 143 01/20/2015   K 3.4* 01/20/2015   CL 102 01/20/2015   CO2 31 01/20/2015    Lab Results  Component Value Date   HGBA1C 6.2* 01/20/2015   Lipid Panel     Component Value Date/Time   CHOL 198 11/03/2014 0938   TRIG  175.0* 11/03/2014 0938   HDL 63.30 11/03/2014 0938   CHOLHDL 3 11/03/2014 0938   VLDL 35.0 11/03/2014 0938   LDLCALC 100* 11/03/2014 0938       Assessment and plan:    1. Type 2 diabetes mellitus without complication  - COMPLETE METABOLIC PANEL WITH GFR - CBC with Differential - Lipid panel  2. Hypercholesterolemia - Lipid panel today -will continue medications at current dosage for now (simvastatin and crestor)  3. Hypertension -Continue amlodipine 5 mg daily  4.Insomnia - will consider adding Trazadone if no contriindication found with blood results.  5. Health maintenance is up to day except for mammogram, which I have recommended she do ASAP        Return in about 3 months (around 08/30/2015) for Diabetes, A1C.  The patient was given clear instructions to go to ER or return to medical center if symptoms don't improve, worsen or new  problems develop. The patient verbalized understanding. The patient was told to call to get lab results if they haven't heard anything in the next week.       Micheline Chapman, FNP-BC  05/30/2015, 3:08 PM

## 2015-05-31 ENCOUNTER — Telehealth: Payer: Self-pay

## 2015-05-31 ENCOUNTER — Other Ambulatory Visit: Payer: Self-pay | Admitting: Family Medicine

## 2015-05-31 MED ORDER — FERROUS SULFATE 325 (65 FE) MG PO TABS: 325.0000 mg | ORAL_TABLET | Freq: Every day | ORAL | Status: DC

## 2015-05-31 NOTE — Telephone Encounter (Signed)
Called and advised patient of lab results and to start taking iron supplement. Patient verbalized understanding and agreed to start iron supplement. Thanks!

## 2015-05-31 NOTE — Telephone Encounter (Signed)
-----   Message from Micheline Chapman, NP sent at 05/31/2015  7:55 AM EDT ----- Triglycerides continue to be mildly elevated. Continue current dose of simvastatin and crestor. Watch fats in diet. You are a little more anemic than last draw. Would recommend iron supplement. Will send in prescription. Be sure stay well hydrated.

## 2015-06-01 ENCOUNTER — Other Ambulatory Visit: Payer: Self-pay

## 2015-06-01 ENCOUNTER — Ambulatory Visit (HOSPITAL_COMMUNITY): Payer: Medicare Other | Attending: Cardiology

## 2015-06-01 DIAGNOSIS — I371 Nonrheumatic pulmonary valve insufficiency: Secondary | ICD-10-CM | POA: Insufficient documentation

## 2015-06-01 DIAGNOSIS — I071 Rheumatic tricuspid insufficiency: Secondary | ICD-10-CM | POA: Diagnosis not present

## 2015-06-01 DIAGNOSIS — I340 Nonrheumatic mitral (valve) insufficiency: Secondary | ICD-10-CM | POA: Diagnosis not present

## 2015-06-01 DIAGNOSIS — I517 Cardiomegaly: Secondary | ICD-10-CM | POA: Insufficient documentation

## 2015-06-02 ENCOUNTER — Telehealth: Payer: Self-pay | Admitting: Cardiology

## 2015-06-02 DIAGNOSIS — I272 Pulmonary hypertension, unspecified: Secondary | ICD-10-CM

## 2015-06-02 NOTE — Telephone Encounter (Signed)
-----   Message from Sueanne Margarita, MD sent at 06/01/2015  9:15 PM EDT ----- Please let patient know that echo showed normal LVF with mildly thickened and calcified AV, mild to moderate MR, moderate pulmonary HTN - repeat echo in 1 year to follow pulmonary HTN - please get PFTs to rule out primary lung disease as cause of pulmonary HTN and Chest CT to rule out chronic PE.  She has a sleep study pending

## 2015-06-02 NOTE — Telephone Encounter (Signed)
Follow up      Daughter is returning a call to [facility]

## 2015-06-02 NOTE — Telephone Encounter (Signed)
Spoke with patient's daughter and informed her of results. PFTs, CT, and repeat ECHO ordered for scheduling. Patient's daughter agrees with treatment plan.

## 2015-06-03 DIAGNOSIS — M654 Radial styloid tenosynovitis [de Quervain]: Secondary | ICD-10-CM | POA: Diagnosis not present

## 2015-06-06 ENCOUNTER — Telehealth: Payer: Self-pay | Admitting: Cardiology

## 2015-06-06 NOTE — Telephone Encounter (Signed)
Patient wanted to confirm time and date of CT. Patient understands to arrive Thursday morning at [facility]. Patient grateful for callback.

## 2015-06-06 NOTE — Telephone Encounter (Signed)
New message     Pt is returning Sherri's call regarding an appt. Please call to discuss

## 2015-06-07 ENCOUNTER — Other Ambulatory Visit: Payer: Self-pay

## 2015-06-09 ENCOUNTER — Inpatient Hospital Stay: Admission: RE | Admit: 2015-06-09 | Payer: Self-pay | Source: Ambulatory Visit

## 2015-06-09 ENCOUNTER — Ambulatory Visit (INDEPENDENT_AMBULATORY_CARE_PROVIDER_SITE_OTHER)
Admission: RE | Admit: 2015-06-09 | Discharge: 2015-06-09 | Disposition: A | Discharge status: Home / Self Care | Payer: Medicare Other | Source: Ambulatory Visit | Attending: Cardiology | Admitting: Cardiology

## 2015-06-09 DIAGNOSIS — I270 Primary pulmonary hypertension: Secondary | ICD-10-CM | POA: Diagnosis not present

## 2015-06-09 DIAGNOSIS — D1779 Benign lipomatous neoplasm of other sites: Secondary | ICD-10-CM | POA: Diagnosis not present

## 2015-06-09 DIAGNOSIS — J984 Other disorders of lung: Secondary | ICD-10-CM | POA: Diagnosis not present

## 2015-06-09 DIAGNOSIS — J9811 Atelectasis: Secondary | ICD-10-CM | POA: Diagnosis not present

## 2015-06-09 MED ORDER — IOHEXOL 350 MG/ML SOLN
100.0000 mL | Freq: Once | INTRAVENOUS | Status: AC | PRN
  Administered 2015-06-09: 100 mL via INTRAVENOUS

## 2015-06-22 ENCOUNTER — Ambulatory Visit (INDEPENDENT_AMBULATORY_CARE_PROVIDER_SITE_OTHER): Payer: Medicare Other | Admitting: Internal Medicine

## 2015-06-22 DIAGNOSIS — I272 Pulmonary hypertension, unspecified: Secondary | ICD-10-CM

## 2015-06-22 DIAGNOSIS — I270 Primary pulmonary hypertension: Secondary | ICD-10-CM

## 2015-06-22 LAB — PULMONARY FUNCTION TEST
DL/VA % pred: 99 %
DL/VA: 4.5 ml/min/mmHg/L
DLCO unc % pred: 71 %
DLCO unc: 15.45 ml/min/mmHg
FEF 25-75 POST: 0.6 L/s
FEF 25-75 PRE: 0.84 L/s
FEF2575-%Change-Post: -28 %
FEF2575-%PRED-POST: 38 %
FEF2575-%Pred-Pre: 53 %
FEV1-%Change-Post: -6 %
FEV1-%PRED-POST: 74 %
FEV1-%Pred-Pre: 79 %
FEV1-Post: 1.24 L
FEV1-Pre: 1.32 L
FEV1FVC-%Change-Post: 0 %
FEV1FVC-%Pred-Pre: 94 %
FEV6-%CHANGE-POST: -7 %
FEV6-%PRED-POST: 81 %
FEV6-%PRED-PRE: 88 %
FEV6-POST: 1.69 L
FEV6-Pre: 1.83 L
FEV6FVC-%PRED-POST: 104 %
FEV6FVC-%PRED-PRE: 104 %
FVC-%Change-Post: -7 %
FVC-%PRED-POST: 78 %
FVC-%PRED-PRE: 84 %
FVC-PRE: 1.83 L
FVC-Post: 1.69 L
POST FEV1/FVC RATIO: 73 %
PRE FEV6/FVC RATIO: 100 %
Post FEV6/FVC ratio: 100 %
Pre FEV1/FVC ratio: 73 %
RV % PRED: 82 %
RV: 1.71 L
TLC % pred: 76 %
TLC: 3.63 L

## 2015-06-22 NOTE — Progress Notes (Signed)
PFT done today. 

## 2015-07-15 DIAGNOSIS — M654 Radial styloid tenosynovitis [de Quervain]: Secondary | ICD-10-CM | POA: Diagnosis not present

## 2015-07-22 ENCOUNTER — Ambulatory Visit: Payer: Self-pay | Admitting: Family Medicine

## 2015-07-29 DIAGNOSIS — M5114 Intervertebral disc disorders with radiculopathy, thoracic region: Secondary | ICD-10-CM | POA: Diagnosis not present

## 2015-07-29 DIAGNOSIS — S3992XA Unspecified injury of lower back, initial encounter: Secondary | ICD-10-CM | POA: Diagnosis not present

## 2015-07-29 DIAGNOSIS — M4726 Other spondylosis with radiculopathy, lumbar region: Secondary | ICD-10-CM | POA: Diagnosis not present

## 2015-07-29 DIAGNOSIS — M4727 Other spondylosis with radiculopathy, lumbosacral region: Secondary | ICD-10-CM | POA: Diagnosis not present

## 2015-08-08 ENCOUNTER — Other Ambulatory Visit: Payer: Self-pay

## 2015-08-09 ENCOUNTER — Ambulatory Visit (HOSPITAL_BASED_OUTPATIENT_CLINIC_OR_DEPARTMENT_OTHER): Payer: Self-pay

## 2015-09-05 ENCOUNTER — Ambulatory Visit (INDEPENDENT_AMBULATORY_CARE_PROVIDER_SITE_OTHER): Payer: Medicare Other | Admitting: Family Medicine

## 2015-09-05 ENCOUNTER — Encounter: Payer: Self-pay | Admitting: Family Medicine

## 2015-09-05 VITALS — HR 75 | Temp 98.3°F | Resp 16 | Ht 62.0 in | Wt 149.0 lb

## 2015-09-05 DIAGNOSIS — E119 Type 2 diabetes mellitus without complications: Secondary | ICD-10-CM | POA: Diagnosis not present

## 2015-09-05 DIAGNOSIS — Z0000 Encounter for general adult medical examination without abnormal findings: Secondary | ICD-10-CM

## 2015-09-05 LAB — CBC WITH DIFFERENTIAL/PLATELET
BASOS PCT: 0 % (ref 0–1)
Basophils Absolute: 0 10*3/uL (ref 0.0–0.1)
EOS PCT: 2 % (ref 0–5)
Eosinophils Absolute: 0.1 10*3/uL (ref 0.0–0.7)
HCT: 35.3 % — ABNORMAL LOW (ref 36.0–46.0)
Hemoglobin: 11.5 g/dL — ABNORMAL LOW (ref 12.0–15.0)
Lymphocytes Relative: 38 % (ref 12–46)
Lymphs Abs: 2.1 10*3/uL (ref 0.7–4.0)
MCH: 31.5 pg (ref 26.0–34.0)
MCHC: 32.6 g/dL (ref 30.0–36.0)
MCV: 96.7 fL (ref 78.0–100.0)
MONO ABS: 0.4 10*3/uL (ref 0.1–1.0)
MPV: 10 fL (ref 8.6–12.4)
Monocytes Relative: 7 % (ref 3–12)
NEUTROS ABS: 2.9 10*3/uL (ref 1.7–7.7)
Neutrophils Relative %: 53 % (ref 43–77)
Platelets: 252 10*3/uL (ref 150–400)
RBC: 3.65 MIL/uL — ABNORMAL LOW (ref 3.87–5.11)
RDW: 13.8 % (ref 11.5–15.5)
WBC: 5.4 10*3/uL (ref 4.0–10.5)

## 2015-09-05 LAB — COMPLETE METABOLIC PANEL WITH GFR
ALBUMIN: 4.4 g/dL (ref 3.6–5.1)
ALT: 12 U/L (ref 6–29)
AST: 19 U/L (ref 10–35)
Alkaline Phosphatase: 46 U/L (ref 33–130)
BILIRUBIN TOTAL: 0.3 mg/dL (ref 0.2–1.2)
BUN: 8 mg/dL (ref 7–25)
CALCIUM: 9.3 mg/dL (ref 8.6–10.4)
CO2: 29 mmol/L (ref 20–31)
Chloride: 104 mmol/L (ref 98–110)
Creat: 0.78 mg/dL (ref 0.60–0.93)
GFR, Est African American: 89 mL/min (ref >=60)
GFR, Est Non African American: 77 mL/min (ref >=60)
Glucose, Bld: 97 mg/dL (ref 65–99)
POTASSIUM: 3.8 mmol/L (ref 3.5–5.3)
Sodium: 144 mmol/L (ref 135–146)
Total Protein: 7.4 g/dL (ref 6.1–8.1)

## 2015-09-05 LAB — LIPID PANEL
CHOLESTEROL: 209 mg/dL — AB (ref 125–200)
HDL: 72 mg/dL (ref >=46)
LDL Cholesterol: 101 mg/dL (ref <130)
Total CHOL/HDL Ratio: 2.9 Ratio (ref <=5.0)
Triglycerides: 180 mg/dL — ABNORMAL HIGH (ref <150)
VLDL: 36 mg/dL — ABNORMAL HIGH (ref <30)

## 2015-09-05 LAB — HEMOGLOBIN A1C
HEMOGLOBIN A1C: 6.3 % — AB (ref <5.7)
Mean Plasma Glucose: 134 mg/dL — ABNORMAL HIGH (ref <117)

## 2015-09-05 MED ORDER — TRAZODONE HCL 50 MG PO TABS: 25.0000 mg | ORAL_TABLET | Freq: Every evening | ORAL | Status: DC | PRN

## 2015-09-05 NOTE — Progress Notes (Signed)
Patient ID: Beth Hubbard, female   DOB: [birth year], 69 y.o.   MRN: [id number]   Beth Hubbard, is a 69 y.o. female  [id number]  [id number]  DOB - [birth year]  CC:  Chief Complaint  Patient presents with  . Diabetes       HPI: Beth Hubbard is a 69 y.o. female here to follow-up diabetes, hypertension, hypercholesterolemia, She has a history of a TIA and mitral regurgitation.  She denies complaints today.He last A1C in March was 6.2. She reports not needing refills on her medications today. She reports having her flu shot this fall at her drug store. She is not due for her 2nd pneumonia shot until April of 2017. She is due for Hepatitis C and HIV screening as she has never had these.  Allergies  Allergen Reactions  . Aspirin     Tremor   . Penicillins Itching and Other (See Comments)    Reaction: itching,headache, dizziness and anxiety. Feels like she is "goin off"  . Nsaids Anxiety    Reaction: causes dizziness and headache. Ibuprofen. Aspirin   Past Medical History  Diagnosis Date  . Shingles   . TIA (transient ischemic attack)   . Hypertension   . Diabetes mellitus without complication ([facility])   . High cholesterol   . Mitral regurgitation     moderate by echo 2015  . Excessive daytime sleepiness    Current Outpatient Prescriptions on File Prior to Visit  Medication Sig Dispense Refill  . ACCU-CHEK AVIVA PLUS test strip     . ACCU-CHEK SOFTCLIX LANCETS lancets     . amLODipine (NORVASC) 5 MG tablet Take 1 tablet (5 mg total) by mouth daily. 30 tablet 3  . COMBIGAN 0.2-0.5 % ophthalmic solution Place 1 drop into the left eye 2 (two) times daily.    Marland Kitchen dexamethasone (DECADRON) 4 MG tablet Take 4 mg by mouth daily.    . ferrous sulfate 325 (65 FE) MG tablet Take 1 tablet (325 mg total) by mouth daily with breakfast. 30 tablet 3  . folic acid (FOLVITE) 413 MCG tablet Take 400 mcg by mouth daily.    Marland Kitchen gabapentin (NEURONTIN) 100 MG capsule Take 1-2 capsules (100-200 mg  total) by mouth 2 (two) times daily. Take 100 mg every morning and 200 mg every night at bedtime. 90 capsule 3  . HYDROcodone-acetaminophen (NORCO) 10-325 MG per tablet Take 1 tablet by mouth every 8 (eight) hours as needed (pain.).    Marland Kitchen rosuvastatin (CRESTOR) 20 MG tablet Take 1 tablet (20 mg total) by mouth daily. 30 tablet 3  . simvastatin (ZOCOR) 40 MG tablet Take 40 mg by mouth at bedtime.    . sitaGLIPtin (JANUVIA) 100 MG tablet Take 1 tablet (100 mg total) by mouth daily. 30 tablet 3  . sulindac (CLINORIL) 200 MG tablet Take 200 mg by mouth 2 (two) times daily.     No current facility-administered medications on file prior to visit.   Family History  Problem Relation Age of Onset  . Cancer Father   . Cancer Sister   . Cancer Brother    Social History   Social History  . Marital Status: Legally Separated    Spouse Name: N/A  . Number of Children: N/A  . Years of Education: N/A   Occupational History  . Not on file.   Social History Main Topics  . Smoking status: Never Smoker   . Smokeless tobacco: Not on file  . Alcohol Use: No  . Drug  Use: No  . Sexual Activity: Not on file   Other Topics Concern  . Not on file   Social History Narrative    Review of Systems: Constitutional: Negative for fever, chills, appetite change, weight loss,  Fatigue. Skin: Negative for rashes or lesions of concern. HENT: Negative for ear pain, ear discharge.nose bleeds Eyes: Negative for pain, discharge, redness, itching and visual disturbance. Neck: Negative for pain, stiffness Respiratory: Negative for cough, shortness of breath,   Cardiovascular: Negative for chest pain, palpitations and leg swelling. Gastrointestinal: Negative for abdominal pain, nausea, vomiting, diarrhea, constipations Genitourinary: Negative for dysuria, urgency, frequency, hematuria,  Musculoskeletal: Negative  joint pain, joint  swelling, and gait problem.Negative for weakness.Positive for low back  pain. Neurological: Negative for  tremors, seizures, syncope,   light-headedness, numbness. Positive for occassional headaches and dizziness. Hematological: Negative for easy bruising or bleeding Psychiatric/Behavioral: Negative for depression, anxiety, decreased concentration, confusion. Positive for insomnia.  Objective:   Filed Vitals:   09/05/15 1132  BP: 143/62  Pulse: 75  Temp: 98.3 F (36.8 C)  Resp: 16    Physical Exam: Constitutional: Patient appears well-developed and well-nourished. No distress. HENT: Normocephalic, atraumatic, External right and left ear normal. Oropharynx is clear and moist.  Eyes: Conjunctivae and EOM are normal. PERRLA, no scleral icterus. Neck: Normal ROM. Neck supple. No lymphadenopathy, No thyromegaly. CVS: RRR, S1/S2 +, no murmurs, no gallops, no rubs Pulmonary: Effort and breath sounds normal, no stridor, rhonchi, wheezes, rales.  Abdominal: Soft. Normoactive BS,, no distension, tenderness, rebound or guarding.  Musculoskeletal: Normal range of motion. No edema and no tenderness.  Neuro: Alert.Normal muscle tone coordination. Non-focal Skin: Skin is warm and dry. No rash noted. Not diaphoretic. No erythema. No pallor. Psychiatric: Normal mood and affect. Behavior, judgment, thought content normal.  Lab Results  Component Value Date   WBC 6.3 05/30/2015   HGB 11.3* 05/30/2015   HCT 34.0* 05/30/2015   MCV 95.8 05/30/2015   PLT 230 05/30/2015   Lab Results  Component Value Date   CREATININE 0.95 05/30/2015   BUN 19 05/30/2015   NA 146* 05/30/2015   K 3.4* 05/30/2015   CL 103 05/30/2015   CO2 33* 05/30/2015    Lab Results  Component Value Date   HGBA1C 6.2* 01/20/2015   Lipid Panel     Component Value Date/Time   CHOL 173 05/30/2015 1506   TRIG 191* 05/30/2015 1506   HDL 58 05/30/2015 1506   CHOLHDL 3.0 05/30/2015 1506   VLDL 38 05/30/2015 1506   LDLCALC 77 05/30/2015 1506       Assessment and plan:   1. Health care  maintenance - COMPLETE METABOLIC PANEL WITH GFR - CBC with Differential - Lipid panel - HIV antibody (with reflex) - Hepatitis C antibody; Standing - Vitamin D 1,25 dihydroxy - Hepatitis C antibody  2. Diabetes mellitus without complication (HCC)  - COMPLETE METABOLIC PANEL WITH GFR - Lipid panel - Hemoglobin A1c  3. Insomnia -Trazadone 50 mg, #30. One po qhs sleep.   Return in about 6 months (around 03/05/2016).  The patient was given clear instructions to go to ER or return to medical center if symptoms don't improve, worsen or new problems develop. The patient verbalized understanding.    Micheline Chapman FNP  09/05/2015, 4:16 PM

## 2015-09-06 LAB — HIV ANTIBODY (ROUTINE TESTING W REFLEX): HIV 1&2 Ab, 4th Generation: NONREACTIVE

## 2015-09-08 ENCOUNTER — Telehealth: Payer: Self-pay | Admitting: Family Medicine

## 2015-09-08 ENCOUNTER — Other Ambulatory Visit: Payer: Self-pay

## 2015-09-08 ENCOUNTER — Other Ambulatory Visit: Payer: Self-pay | Admitting: Family Medicine

## 2015-09-08 LAB — VITAMIN D 1,25 DIHYDROXY
VITAMIN D3 1, 25 (OH): 60 pg/mL
Vitamin D 1, 25 (OH)2 Total: 60 pg/mL (ref 18–72)

## 2015-09-08 MED ORDER — HYDROXYZINE PAMOATE 50 MG PO CAPS: ORAL_CAPSULE | ORAL | Status: DC

## 2015-09-08 NOTE — Telephone Encounter (Signed)
I'll send in some hydroxizine but we do not prescribe anything stronger than that.

## 2015-09-08 NOTE — Telephone Encounter (Signed)
Patient stated medication is not helping her sleep. Please call to advise.

## 2015-09-08 NOTE — Telephone Encounter (Signed)
May try taking 2 at bedtime.

## 2015-09-08 NOTE — Telephone Encounter (Signed)
Sent rx into correct pharmacy. Thanks!

## 2015-09-08 NOTE — Telephone Encounter (Signed)
Patient states she took 2 last night and it still didn't help. Wants to know if you can change this to something different? Please advise. Thanks!

## 2015-09-19 ENCOUNTER — Other Ambulatory Visit: Payer: Self-pay | Admitting: Orthopedic Surgery

## 2015-09-19 DIAGNOSIS — M5136 Other intervertebral disc degeneration, lumbar region: Secondary | ICD-10-CM

## 2015-10-03 ENCOUNTER — Ambulatory Visit
Admission: RE | Admit: 2015-10-03 | Discharge: 2015-10-03 | Disposition: A | Discharge status: Home / Self Care | Payer: Medicare Other | Source: Ambulatory Visit | Attending: Orthopedic Surgery | Admitting: Orthopedic Surgery

## 2015-10-03 DIAGNOSIS — M5136 Other intervertebral disc degeneration, lumbar region: Secondary | ICD-10-CM

## 2015-10-03 MED ORDER — IOHEXOL 180 MG/ML  SOLN
1.0000 mL | Freq: Once | INTRAMUSCULAR | Status: DC | PRN
  Administered 2015-10-03: 1 mL via EPIDURAL

## 2015-10-03 MED ORDER — METHYLPREDNISOLONE ACETATE 40 MG/ML INJ SUSP (RADIOLOG
120.0000 mg | Freq: Once | INTRAMUSCULAR | Status: AC
  Administered 2015-10-03: 120 mg via EPIDURAL

## 2015-10-03 NOTE — Discharge Instructions (Addendum)

## 2015-10-04 ENCOUNTER — Other Ambulatory Visit: Payer: Self-pay

## 2015-10-17 ENCOUNTER — Ambulatory Visit (HOSPITAL_BASED_OUTPATIENT_CLINIC_OR_DEPARTMENT_OTHER): Payer: Medicare Other | Attending: Cardiology | Admitting: Radiology

## 2015-10-17 VITALS — Ht 62.0 in | Wt 140.0 lb

## 2015-10-17 DIAGNOSIS — G4710 Hypersomnia, unspecified: Secondary | ICD-10-CM

## 2015-10-17 DIAGNOSIS — I493 Ventricular premature depolarization: Secondary | ICD-10-CM | POA: Diagnosis not present

## 2015-10-17 DIAGNOSIS — R0683 Snoring: Secondary | ICD-10-CM | POA: Insufficient documentation

## 2015-10-17 DIAGNOSIS — G4733 Obstructive sleep apnea (adult) (pediatric): Secondary | ICD-10-CM

## 2015-10-17 DIAGNOSIS — G4719 Other hypersomnia: Secondary | ICD-10-CM | POA: Diagnosis not present

## 2015-10-18 ENCOUNTER — Telehealth: Payer: Self-pay | Admitting: Cardiology

## 2015-10-18 ENCOUNTER — Encounter (HOSPITAL_BASED_OUTPATIENT_CLINIC_OR_DEPARTMENT_OTHER): Payer: Self-pay | Admitting: Radiology

## 2015-10-18 DIAGNOSIS — G4733 Obstructive sleep apnea (adult) (pediatric): Secondary | ICD-10-CM | POA: Insufficient documentation

## 2015-10-18 HISTORY — DX: Obstructive sleep apnea (adult) (pediatric): G47.33

## 2015-10-18 NOTE — Telephone Encounter (Signed)
Please let patient know that they have sleep apnea and recommend CPAP titration. Please set up titration in the sleep lab. 

## 2015-10-18 NOTE — Sleep Study (Signed)
         Patient Name: Beth Hubbard, Beth Hubbard MRN: [id number] Study Date: 10/17/2015 Gender: Female D.O.B: 1946/05/26 Age (years): 89 Referring Provider: Fransico Him MD, ABSM Interpreting Physician: Fransico Him MD, ABSM RPSGT: Zadie Rhine  Weight (lbs): 140 Height (inches): 62 BMI: 26 Neck Size: 14.00  CLINICAL INFORMATION Sleep Study Type: NPSG Indication for sleep study: Excessive Daytime Sleepiness Epworth Sleepiness Score: 6  SLEEP STUDY TECHNIQUE As per the AASM Manual for the Scoring of Sleep and Associated Events v2.3 (April 2016) with a hypopnea requiring 4% desaturations. The channels recorded and monitored were frontal, central and occipital EEG, electrooculogram (EOG), submentalis EMG (chin), nasal and oral airflow, thoracic and abdominal wall motion, anterior tibialis EMG, snore microphone, electrocardiogram, and pulse oximetry.  MEDICATIONS Patient's medications include: Amlodipine, Pletal, Cleocin, Combigan eye gtts, decadron, ferrous sulfate, Folic acid, Neurontin, Norco, Vistaril, Crestor, Januvia, Clinoril, Trazadone. Medications self-administered by patient during sleep study : No sleep medicine administered.  SLEEP ARCHITECTURE The study was initiated at 9:37:21 PM and ended at 4:33:03 AM. Sleep onset time was 14.7 minutes and the sleep efficiency was mildly reduced at 82.4%. The total sleep time was 342.5 minutes. Stage REM latency was 59.0 minutes. The patient spent 3.50% of the night in stage N1 sleep, 76.93% in stage N2 sleep, 0.29% in stage N3 and 19.27% in REM. Alpha intrusion was absent. Supine sleep was 34.91%.  RESPIRATORY PARAMETERS The overall apnea/hypopnea index (AHI) was 11.9 per hour. There were 42 total apneas, including 42 obstructive, 0 central and 0 mixed apneas. There were 26 hypopneas and 13 RERAs. The AHI during Stage REM sleep was 45.5 per hour. AHI while supine was 30.6 per hour. The mean oxygen saturation was 93.42%. The minimum SpO2  during sleep was 83.00%. Moderate snoring was noted during this study.  CARDIAC DATA The 2 lead EKG demonstrated sinus rhythm. The mean heart rate was 74.24 beats per minute. Other EKG findings include: PVCs.  LEG MOVEMENT DATA The total PLMS were 0 with a resulting PLMS index of 0.00. Associated arousal with leg movement index was 0.0 .  IMPRESSIONS - Mild obstructive sleep apnea occurred during this study (AHI = 11.9/h). - No significant central sleep apnea occurred during this study (CAI = 0.0/h). - Mild oxygen desaturation was noted during this study (Min O2 = 83.00%). - The patient snored with Moderate snoring volume. - EKG findings include PVCs. - Clinically significant periodic limb movements did not occur during sleep. No significant associated arousals.  DIAGNOSIS - Obstructive Sleep Apnea (327.23 [G47.33 ICD-10])  RECOMMENDATIONS - Therapeutic CPAP titration to determine optimal pressure required to alleviate sleep disordered breathing. - Positional therapy avoiding supine position during sleep. - Avoid alcohol, sedatives and other CNS depressants that may worsen sleep apnea and disrupt normal sleep architecture. - Sleep hygiene should be reviewed to assess factors that may improve sleep quality. - Weight management and regular exercise should be initiated or continued if appropriate.   [place], American Board of Sleep Medicine  ELECTRONICALLY SIGNED ON:  10/18/2015, 10:11 PM [place] PH: [phone]   FX: [phone] [place]

## 2015-10-20 NOTE — Addendum Note (Signed)
Addended by: Andres Ege on: 10/20/2015 02:41 PM   Modules accepted: Orders

## 2015-10-20 NOTE — Telephone Encounter (Signed)
Patient had me speak with her daughter (Per DPR).  She is aware of the results and states verbal understanding.  Okay to proceed with CPAP titration.   Will schedule and mail patient a letter with date.

## 2015-10-21 ENCOUNTER — Encounter: Payer: Self-pay | Admitting: *Deleted

## 2015-11-17 ENCOUNTER — Other Ambulatory Visit: Payer: Self-pay | Admitting: Family Medicine

## 2015-11-17 MED ORDER — AMLODIPINE BESYLATE 5 MG PO TABS: 5.0000 mg | ORAL_TABLET | Freq: Every day | ORAL | Status: DC

## 2015-11-17 MED ORDER — ROSUVASTATIN CALCIUM 20 MG PO TABS: 20.0000 mg | ORAL_TABLET | Freq: Every day | ORAL | Status: DC

## 2015-11-17 NOTE — Telephone Encounter (Signed)
We do not prescribe narcotics.

## 2015-11-17 NOTE — Telephone Encounter (Signed)
Refill request for Norco 10/325mg  Please advise Last seen 08/2015.  Thanks!

## 2015-11-23 NOTE — Telephone Encounter (Signed)
SPOKE TO PATIENT ABOUT RESULTS

## 2015-11-25 ENCOUNTER — Ambulatory Visit (HOSPITAL_BASED_OUTPATIENT_CLINIC_OR_DEPARTMENT_OTHER): Payer: Self-pay

## 2015-11-28 ENCOUNTER — Ambulatory Visit (HOSPITAL_BASED_OUTPATIENT_CLINIC_OR_DEPARTMENT_OTHER): Payer: Medicare Other | Attending: Cardiology | Admitting: Radiology

## 2015-11-28 VITALS — Ht 62.0 in | Wt 150.0 lb

## 2015-11-28 DIAGNOSIS — G4730 Sleep apnea, unspecified: Secondary | ICD-10-CM | POA: Diagnosis present

## 2015-11-28 DIAGNOSIS — I493 Ventricular premature depolarization: Secondary | ICD-10-CM | POA: Insufficient documentation

## 2015-11-28 DIAGNOSIS — Z79899 Other long term (current) drug therapy: Secondary | ICD-10-CM | POA: Diagnosis not present

## 2015-11-28 DIAGNOSIS — G4733 Obstructive sleep apnea (adult) (pediatric): Secondary | ICD-10-CM | POA: Insufficient documentation

## 2015-11-29 ENCOUNTER — Other Ambulatory Visit (HOSPITAL_COMMUNITY): Payer: Self-pay | Admitting: Family Medicine

## 2015-11-29 ENCOUNTER — Telehealth: Payer: Self-pay | Admitting: *Deleted

## 2015-11-29 MED ORDER — TRAMADOL HCL 50 MG PO TABS
ORAL_TABLET | ORAL | Status: DC
Start: 2015-11-29 — End: 2016-02-24

## 2015-11-29 NOTE — Telephone Encounter (Signed)
Patient verified DOB Patient informed of clinic not prescribing narcotics for pain.  Patient informed of Tramadol being prescribed for pain here at the clinic. Patient advised to pick up paper prescription and have medication filled at [facility]. Patient asked about her BP medication being filled. Medical Assistant informed patient of Amlodipine being refilled on 11/17/15 with 3 additional refills to the [facility]. Patient stated she will have a ride on tomorrow to pick up the printed prescription. Patient had no further questions and expressed her understanding.

## 2015-11-29 NOTE — Telephone Encounter (Signed)
Inform her we do not prescribe narcotics for pain. The best I can do is Tramadol, if she would like that. If so I will send in on Thursday.

## 2015-12-05 ENCOUNTER — Telehealth: Payer: Self-pay | Admitting: Cardiology

## 2015-12-05 NOTE — Telephone Encounter (Signed)
Pt had successful PAP titration. Please setup appointment in 10 weeks. Please let [facility] know that order for PAP is in EPIC.   

## 2015-12-05 NOTE — Progress Notes (Signed)
    Patient Name: Beth Hubbard, Beth Hubbard MRN: [id number] Study Date: 11/28/2015 Gender: Female D.O.B: 12-17-1945 Age (years): 72 Referring Provider: Fransico Him MD, ABSM Interpreting Physician: Fransico Him MD, ABSM RPSGT: Zadie Rhine  Weight (lbs): 150 Height (inches): 62 BMI: 27 Neck Size: 14.00  CLINICAL INFORMATION The patient is referred for a CPAP titration to treat sleep apnea. Date of NPSG, Split Night or HST:10/18/2015  SLEEP STUDY TECHNIQUE As per the AASM Manual for the Scoring of Sleep and Associated Events v2.3 (April 2016) with a hypopnea requiring 4% desaturations. The channels recorded and monitored were frontal, central and occipital EEG, electrooculogram (EOG), submentalis EMG (chin), nasal and oral airflow, thoracic and abdominal wall motion, anterior tibialis EMG, snore microphone, electrocardiogram, and pulse oximetry. Continuous positive airway pressure (CPAP) was initiated at the beginning of the study and titrated to treat sleep-disordered breathing.  MEDICATIONS Medications taken by the patient : Amlodipine, Pletal, Combigan eye gtt, Iron sulfate, Decadron, Folvite, Vistaril, Crestor, Januvia, Ultram, NEURONTIN, NORCO, CLINORIL, DESYREL Medications administered by patient during sleep study : No sleep medicine administered.  TECHNICIAN COMMENTS Comments added by technician: Patient had difficulty initiating sleep. PT WENT TO RESTROOM ONCE. PT TOOK HER MEDICATION AT 9:00 PM  Comments added by scorer: N/A  RESPIRATORY PARAMETERS Optimal PAP Pressure (cm): 7  AHI at Optimal Pressure (/hr):0.5 Overall Minimal O2 (%):92.00   Supine % at Optimal Pressure (%): 0 Minimal O2 at Optimal Pressure (%):92.0      SLEEP ARCHITECTURE The study was initiated at 9:27:24 PM and ended at 4:54:55 AM. Sleep onset time was 132.3 minutes and the sleep efficiency was reduced at 62.0%. The total sleep time was 277.5 minutes. The patient spent 5.23% of the night in stage N1 sleep,  60.72% in stage N2 sleep, 0.54% in stage N3 and 33.51% in REM.Stage REM latency was 86.0 minutes Wake after sleep onset was 37.7. Alpha intrusion was absent. Supine sleep was 58.20%.  CARDIAC DATA The 2 lead EKG demonstrated sinus rhythm. The mean heart rate was 58.43 beats per minute. Other EKG findings include: PVCs.  LEG MOVEMENT DATA The total Periodic Limb Movements of Sleep (PLMS) were 0. The PLMS index was 0.00. A PLMS index of <15 is considered normal in adults.  IMPRESSIONS - The optimal PAP pressure was 7cm of water. - Central sleep apnea was not noted during this titration (CAI = 0.6/h). - Significant oxygen desaturations were not observed during this titration (min O2 = 92.00%). - No snoring was audible during this study. - 2-lead EKG demonstrated: PVCs - Clinically significant periodic limb movements were not noted during this study. Arousals associated with PLMs were rare.  DIAGNOSIS - Obstructive Sleep Apnea (327.23 [G47.33 ICD-10])  RECOMMENDATIONS - Trial of CPAP therapy on 7 cm H2O with a Standard size Resmed Nasal Mask [place] FX for Her mask and heated humidification. - Avoid alcohol, sedatives and other CNS depressants that may worsen sleep apnea and disrupt normal sleep architecture. - Sleep hygiene should be reviewed to assess factors that may improve sleep quality. - Weight management and regular exercise should be initiated or continued. - Return to Sleep Center for re-evaluation after 10 weeks of therapy   [place], American Board of Sleep Medicine  ELECTRONICALLY SIGNED ON:  12/05/2015, 4:53 PM [place] PH: [phone]   FX: [phone] [place]

## 2015-12-05 NOTE — Addendum Note (Signed)
Addended by: Fransico Him R on: 12/05/2015 04:57 PM   Modules accepted: Orders

## 2015-12-06 NOTE — Telephone Encounter (Signed)
Patient informed of information.  Stated verbal understanding.  [place] notified of orders.

## 2015-12-15 ENCOUNTER — Encounter (HOSPITAL_BASED_OUTPATIENT_CLINIC_OR_DEPARTMENT_OTHER): Payer: Self-pay

## 2016-01-09 ENCOUNTER — Telehealth: Payer: Self-pay | Admitting: Cardiology

## 2016-01-09 DIAGNOSIS — G4733 Obstructive sleep apnea (adult) (pediatric): Secondary | ICD-10-CM

## 2016-01-09 NOTE — Telephone Encounter (Signed)
New message   Pt wants Dr.Turner to reduce the pressure in her sleep machine

## 2016-01-10 ENCOUNTER — Telehealth: Payer: Self-pay | Admitting: Cardiology

## 2016-01-10 NOTE — Telephone Encounter (Signed)
Patient states that every time she puts her mask on the pressure is so strong that she cannot catch her breath.  Patient states that her machine is set on 7cm H20.  Will route to Dr. Radford Pax to advise.

## 2016-01-10 NOTE — Telephone Encounter (Signed)
Returning a Call about the pressure on her sleep Machine.

## 2016-01-10 NOTE — Telephone Encounter (Signed)
Patient aware that we will be doing auto-titration and that [facility] will be calling her

## 2016-01-10 NOTE — Telephone Encounter (Signed)
See previous phone note.  

## 2016-01-10 NOTE — Telephone Encounter (Signed)
Orders have been placed, [place] notified.   Message has been left for patient to call back

## 2016-01-10 NOTE — Addendum Note (Signed)
Addended by: Andres Ege on: 01/10/2016 10:49 AM   Modules accepted: Orders

## 2016-01-10 NOTE — Telephone Encounter (Signed)
Please order a 2 week autotitration from 4 to 18cm H2O with C flex of 3

## 2016-01-19 ENCOUNTER — Encounter: Payer: Self-pay | Admitting: Cardiology

## 2016-02-23 ENCOUNTER — Encounter: Payer: Self-pay | Admitting: Cardiology

## 2016-02-24 ENCOUNTER — Encounter: Payer: Self-pay | Admitting: Cardiology

## 2016-02-24 ENCOUNTER — Ambulatory Visit (INDEPENDENT_AMBULATORY_CARE_PROVIDER_SITE_OTHER): Payer: Medicare Other | Admitting: Cardiology

## 2016-02-24 VITALS — BP 122/58 | HR 75 | Ht 62.0 in | Wt 150.0 lb

## 2016-02-24 DIAGNOSIS — G4733 Obstructive sleep apnea (adult) (pediatric): Secondary | ICD-10-CM

## 2016-02-24 DIAGNOSIS — I10 Essential (primary) hypertension: Secondary | ICD-10-CM

## 2016-02-24 DIAGNOSIS — I340 Nonrheumatic mitral (valve) insufficiency: Secondary | ICD-10-CM | POA: Diagnosis not present

## 2016-02-24 DIAGNOSIS — G4719 Other hypersomnia: Secondary | ICD-10-CM

## 2016-02-24 NOTE — Progress Notes (Signed)
Cardiology Office Note    Date:  02/24/2016   ID:  Beth Hubbard, Beth Hubbard [birth year], MRN [id number]  PCP:  MATTHEWS,MICHELLE A., MD  Cardiologist:  Sueanne Margarita, MD   Chief Complaint  Patient presents with  . Sleep Apnea  . Mitral Regurgitation    History of Present Illness:  Beth Hubbard is a 70 y.o. female with a history of DM, dyslipidemia, no ischemia on nuclear stress test, moderate MR with normal LVF by echo, OSA and HTN who presents today for followup. She recently had a repeat sleep study which showed mild OSA with an AHI of 11.9/hr with oxygen desaturations as low as 83%.  She was noted to have PVCs on telemetry during the test.  She underwent CPAP titration to 7cm H2O.  She says that the pressure is blowing the nasal pillow mask off of her nose.  Since she has been on CPAP she is sleeping better at night and feels restored in the am and has no daytime sleepiness.   She denies any anginal chest pain, SOB, DOE (execept with walking fast), LE edema, dizziness, palpitations or syncope.     Past Medical History  Diagnosis Date  . Shingles   . TIA (transient ischemic attack)   . Hypertension   . Diabetes mellitus without complication ([place])   . High cholesterol   . Mitral regurgitation     moderate by echo 2015  . Excessive daytime sleepiness   . OSA (obstructive sleep apnea) 10/18/2015    Mild OSA with AHI 11/hr    Past Surgical History  Procedure Laterality Date  . Abdominal surgery    . Rotator cuff repair      bilateral    Current Medications: Outpatient Prescriptions Prior to Visit  Medication Sig Dispense Refill  . ACCU-CHEK AVIVA PLUS test strip Reported on 02/24/2016    . ACCU-CHEK SOFTCLIX LANCETS lancets     . amLODipine (NORVASC) 5 MG tablet Take 1 tablet (5 mg total) by mouth daily. 30 tablet 3  . COMBIGAN 0.2-0.5 % ophthalmic solution Place 1 drop into the left eye 2 (two) times daily.    . ferrous sulfate 325 (65 FE) MG tablet Take 1 tablet (325 mg  total) by mouth daily with breakfast. 30 tablet 3  . folic acid (FOLVITE) A999333 MCG tablet Take 400 mcg by mouth daily.    . rosuvastatin (CRESTOR) 20 MG tablet Take 1 tablet (20 mg total) by mouth daily. 30 tablet 3  . sitaGLIPtin (JANUVIA) 100 MG tablet Take 1 tablet (100 mg total) by mouth daily. 30 tablet 3  . sulindac (CLINORIL) 200 MG tablet Take 200 mg by mouth 2 (two) times daily.    . cilostazol (PLETAL) 100 MG tablet Take 100 mg by mouth 2 (two) times daily. Reported on 02/24/2016    . clindamycin (CLEOCIN) 300 MG capsule Take 300 mg by mouth 3 (three) times daily. Reported on 02/24/2016    . dexamethasone (DECADRON) 4 MG tablet Take 4 mg by mouth daily. Reported on 02/24/2016    . gabapentin (NEURONTIN) 100 MG capsule Take 1-2 capsules (100-200 mg total) by mouth 2 (two) times daily. Take 100 mg every morning and 200 mg every night at bedtime. (Patient not taking: Reported on 02/24/2016) 90 capsule 3  . HYDROcodone-acetaminophen (NORCO) 10-325 MG per tablet Take 1 tablet by mouth every 8 (eight) hours as needed (pain.). Reported on 02/24/2016    . hydrOXYzine (VISTARIL) 50 MG capsule Take one or two  at bedtime for sleep. Start with one and increase if necessary. (Patient not taking: Reported on 02/24/2016) 60 capsule 0  . simvastatin (ZOCOR) 40 MG tablet Take 40 mg by mouth at bedtime.    . traMADol (ULTRAM) 50 MG tablet Take one every 8 hours as needed for pain (Patient not taking: Reported on 02/24/2016) 30 tablet 0  . traZODone (DESYREL) 50 MG tablet Take 0.5-1 tablets (25-50 mg total) by mouth at bedtime as needed for sleep. (Patient not taking: Reported on 02/24/2016) 30 tablet 3   No facility-administered medications prior to visit.     Allergies:   Aspirin; Penicillins; and Nsaids   Social History   Social History  . Marital Status: Legally Separated    Spouse Name: N/A  . Number of Children: N/A  . Years of Education: N/A   Social History Main Topics  . Smoking status: Never Smoker   .  Smokeless tobacco: None  . Alcohol Use: No  . Drug Use: No  . Sexual Activity: Not Asked   Other Topics Concern  . None   Social History Narrative     Family History:  The patient's family history includes Cancer in her brother, father, and sister.   ROS:   Please see the history of present illness.    ROS All other systems reviewed and are negative.   PHYSICAL EXAM:   VS:  BP 122/58 mmHg  Pulse 75  Ht 5\' 2"  (1.575 m)  Wt 150 lb (68.04 kg)  BMI 27.43 kg/m2   GEN: Well nourished, well developed, in no acute distress HEENT: normal Neck: no JVD, carotid bruits, or masses Cardiac: RRR; no murmurs, rubs, or gallops,no edema.  Intact distal pulses bilaterally.  Respiratory:  clear to auscultation bilaterally, normal work of breathing GI: soft, nontender, nondistended, + BS MS: no deformity or atrophy Skin: warm and dry, no rash Neuro:  Alert and Oriented x 3, Strength and sensation are intact Psych: euthymic mood, full affect  Wt Readings from Last 3 Encounters:  02/24/16 150 lb (68.04 kg)  11/28/15 150 lb (68.04 kg)  10/17/15 140 lb (63.504 kg)      Studies/Labs Reviewed:   EKG:  EKG is ordered today.  The ekg ordered today demonstrates NSR at 75 bpm with no ST changes  Recent Labs: 09/05/2015: ALT 12; BUN 8; Creat 0.78; Hemoglobin 11.5*; Platelets 252; Potassium 3.8; Sodium 144   Lipid Panel    Component Value Date/Time   CHOL 209* 09/05/2015 1217   TRIG 180* 09/05/2015 1217   HDL 72 09/05/2015 1217   CHOLHDL 2.9 09/05/2015 1217   VLDL 36* 09/05/2015 1217   LDLCALC 101 09/05/2015 1217    Additional studies/ records that were reviewed today include:  Sleep study and CPAP titration    ASSESSMENT:    1. OSA (obstructive sleep apnea)   2. Mitral regurgitation   3. Essential hypertension   4. Excessive daytime sleepiness      PLAN:  In order of problems listed above:  1. OSA doing well on CPAP but says that the pressure is too high and is blowing  her mask off.  I will lower the CPAP pressure to 5cm H2O and get a d/l in 2 weeks.  Patient has been using and benefiting from CPAP use and will continue to benefit from therapy.  2. Moderate MR by echo.  Repeat echo in 6 months.   3. HTN - controlled on current regimen.  Continue amlodipine. 4. Excessive daytime sleepiness -  improved on CPAP.    Followup with me in 1 year    Medication Adjustments/Labs and Tests Ordered: Current medicines are reviewed at length with the patient today.  Concerns regarding medicines are outlined above.  Medication changes, Labs and Tests ordered today are listed in the Patient Instructions below. There are no Patient Instructions on file for this visit.   Lurena Nida, MD  02/24/2016 11:00 AM    [facility] [address], [place], [place]  [address] Phone: [phone]; Fax: [phone]

## 2016-02-24 NOTE — Patient Instructions (Addendum)
Medication Instructions:  None  Labwork: None  Testing/Procedures: Your physician has requested that you have an echocardiogram in July 2017. Echocardiography is a painless test that uses sound waves to create images of your heart. It provides your doctor with information about the size and shape of your heart and how well your heart's chambers and valves are working. This procedure takes approximately one hour. There are no restrictions for this procedure.    Follow-Up: Your physician wants you to follow-up in: 1 year with Dr. Radford Pax. You will receive a reminder letter in the mail two months in advance. If you don't receive a letter, please call our office to schedule the follow-up appointment.   Any Other Special Instructions Will Be Listed Below (If Applicable).     If you need a refill on your cardiac medications before your next appointment, please call your pharmacy.

## 2016-03-05 ENCOUNTER — Encounter: Payer: Self-pay | Admitting: Family Medicine

## 2016-03-05 ENCOUNTER — Ambulatory Visit (INDEPENDENT_AMBULATORY_CARE_PROVIDER_SITE_OTHER): Payer: Medicare Other | Admitting: Family Medicine

## 2016-03-05 VITALS — BP 119/54 | HR 84 | Temp 98.4°F | Ht 62.0 in | Wt 150.0 lb

## 2016-03-05 DIAGNOSIS — Z23 Encounter for immunization: Secondary | ICD-10-CM

## 2016-03-05 DIAGNOSIS — Z1239 Encounter for other screening for malignant neoplasm of breast: Secondary | ICD-10-CM

## 2016-03-05 DIAGNOSIS — Z13820 Encounter for screening for osteoporosis: Secondary | ICD-10-CM

## 2016-03-05 DIAGNOSIS — E139 Other specified diabetes mellitus without complications: Secondary | ICD-10-CM

## 2016-03-05 DIAGNOSIS — Z1159 Encounter for screening for other viral diseases: Secondary | ICD-10-CM

## 2016-03-05 DIAGNOSIS — I10 Essential (primary) hypertension: Secondary | ICD-10-CM

## 2016-03-05 LAB — CBC WITH DIFFERENTIAL/PLATELET
BASOS ABS: 0 {cells}/uL (ref 0–200)
Basophils Relative: 0 %
EOS ABS: 186 {cells}/uL (ref 15–500)
EOS PCT: 3 %
HEMATOCRIT: 34.4 % — AB (ref 35.0–45.0)
HEMOGLOBIN: 11.1 g/dL — AB (ref 11.7–15.5)
LYMPHS ABS: 1860 {cells}/uL (ref 850–3900)
Lymphocytes Relative: 30 %
MCH: 31.5 pg (ref 27.0–33.0)
MCHC: 32.3 g/dL (ref 32.0–36.0)
MCV: 97.7 fL (ref 80.0–100.0)
MONO ABS: 372 {cells}/uL (ref 200–950)
MPV: 10.4 fL (ref 7.5–12.5)
Monocytes Relative: 6 %
NEUTROS ABS: 3782 {cells}/uL (ref 1500–7800)
Neutrophils Relative %: 61 %
Platelets: 234 10*3/uL (ref 140–400)
RBC: 3.52 MIL/uL — ABNORMAL LOW (ref 3.80–5.10)
RDW: 14.2 % (ref 11.0–15.0)
WBC: 6.2 10*3/uL (ref 3.8–10.8)

## 2016-03-05 LAB — COMPLETE METABOLIC PANEL WITH GFR
ALT: 12 U/L (ref 6–29)
AST: 18 U/L (ref 10–35)
Albumin: 4.2 g/dL (ref 3.6–5.1)
Alkaline Phosphatase: 45 U/L (ref 33–130)
BUN: 11 mg/dL (ref 7–25)
CHLORIDE: 103 mmol/L (ref 98–110)
CO2: 27 mmol/L (ref 20–31)
Calcium: 9.1 mg/dL (ref 8.6–10.4)
Creat: 0.89 mg/dL (ref 0.60–0.93)
GFR, EST NON AFRICAN AMERICAN: 66 mL/min (ref >=60)
GFR, Est African American: 76 mL/min (ref >=60)
GLUCOSE: 87 mg/dL (ref 65–99)
POTASSIUM: 3.9 mmol/L (ref 3.5–5.3)
Sodium: 142 mmol/L (ref 135–146)
Total Bilirubin: 0.3 mg/dL (ref 0.2–1.2)
Total Protein: 6.9 g/dL (ref 6.1–8.1)

## 2016-03-05 LAB — LIPID PANEL
CHOL/HDL RATIO: 2.8 ratio (ref <=5.0)
CHOLESTEROL: 202 mg/dL — AB (ref 125–200)
HDL: 73 mg/dL (ref >=46)
LDL Cholesterol: 95 mg/dL (ref <130)
Triglycerides: 171 mg/dL — ABNORMAL HIGH (ref <150)
VLDL: 34 mg/dL — AB (ref <30)

## 2016-03-05 LAB — TSH: TSH: 1.27 mIU/L

## 2016-03-05 MED ORDER — ROSUVASTATIN CALCIUM 20 MG PO TABS: 20.0000 mg | ORAL_TABLET | Freq: Every day | ORAL | Status: DC

## 2016-03-05 MED ORDER — AMLODIPINE BESYLATE 5 MG PO TABS: 5.0000 mg | ORAL_TABLET | Freq: Every day | ORAL | Status: DC

## 2016-03-05 MED ORDER — SITAGLIPTIN PHOSPHATE 100 MG PO TABS: 100.0000 mg | ORAL_TABLET | Freq: Every day | ORAL | Status: DC

## 2016-03-05 MED ORDER — ACCU-CHEK AVIVA PLUS VI STRP: ORAL_STRIP | Status: DC

## 2016-03-05 MED ORDER — ACCU-CHEK SOFTCLIX LANCETS MISC: Status: DC

## 2016-03-05 NOTE — Progress Notes (Signed)
Patient ID: Beth Hubbard, female   DOB: [birth year], 70 y.o.   MRN: [id number]   Beth Hubbard, is a 71 y.o. female  [id number]  [id number]  DOB - 1946/09/18  CC:  Chief Complaint  Patient presents with  . follow up    complains of feeling something under the skin at the left shoulder with intense itching at times , having some chronic back pain today       HPI: Beth Hubbard is a 70 y.o. female here for follow-up chronic conditions. She has diagnoses of hypertension, diabetes, hyperlipidemia, mitral regurg, sleep apnea with use of CPAP, and back pain. She has a history of TIA in past. Her medications include Amlodipine 10, Januvia, Crestor and sundilac . Her only voiced complaints today are some itching on her left upper back and some low back pain. Allergies  Allergen Reactions  . Aspirin     Tremor   . Penicillins Itching and Other (See Comments)    Reaction: itching,headache, dizziness and anxiety. Feels like she is "goin off"  . Nsaids Anxiety    Reaction: causes dizziness and headache. Ibuprofen. Aspirin   Past Medical History  Diagnosis Date  . Shingles   . TIA (transient ischemic attack)   . Hypertension   . Diabetes mellitus without complication ([place])   . High cholesterol   . Mitral regurgitation     moderate by echo 2015  . Excessive daytime sleepiness   . OSA (obstructive sleep apnea) 10/18/2015    Mild OSA with AHI 11/hr   Current Outpatient Prescriptions on File Prior to Visit  Medication Sig Dispense Refill  . COMBIGAN 0.2-0.5 % ophthalmic solution Place 1 drop into the left eye 2 (two) times daily.    . ferrous sulfate 325 (65 FE) MG tablet Take 1 tablet (325 mg total) by mouth daily with breakfast. 30 tablet 3  . folic acid (FOLVITE) A999333 MCG tablet Take 400 mcg by mouth daily.    Marland Kitchen HYDROcodone-acetaminophen (NORCO/VICODIN) 5-325 MG tablet Take 1 tablet by mouth as directed.  0  . LUMIGAN 0.01 % SOLN Place 1 drop into both eyes at bedtime.  0  .  sulindac (CLINORIL) 200 MG tablet Take 200 mg by mouth 2 (two) times daily.     No current facility-administered medications on file prior to visit.   Family History  Problem Relation Age of Onset  . Cancer Father   . Cancer Sister   . Cancer Brother    Social History   Social History  . Marital Status: Legally Separated    Spouse Name: N/A  . Number of Children: N/A  . Years of Education: N/A   Occupational History  . Not on file.   Social History Main Topics  . Smoking status: Never Smoker   . Smokeless tobacco: Not on file  . Alcohol Use: No  . Drug Use: No  . Sexual Activity: Not on file   Other Topics Concern  . Not on file   Social History Narrative    Review of Systems: Constitutional: Negative for fever, chills, appetite change, weight loss,  Fatigue. Skin: Negative for rashes or lesions of concern.Reports intermittent itching of her left upper back HENT: Negative for ear pain, ear discharge.nose bleeds Eyes: Negative for pain, discharge, redness, itching and visual disturbance. Neck: Negative for pain, stiffness Respiratory: Negative for cough wheezing. Postive for shortness of breath exertion Cardiovascular: Negative for chest pain, palpitations and leg swelling. Some varicosities of ankles and feet. Gastrointestinal: Negative  for abdominal pain, nausea, vomiting, diarrhea, constipations Genitourinary: Negative for dysuria, urgency, frequency, hematuria,  Musculoskeletal: Positive for back pain. Negative for other joint pain, joint  swelling, and gait problem. Positive for minor weakness on left. Reports some leg cramps. Neurological: Negative for dizziness, tremors, seizures, syncope,   light-headedness, numbness. Positive for occ headaches Hematological: Negative for easy bruising or bleeding Psychiatric/Behavioral: Negative for depression, anxiety, decreased concentration, confusion   Objective:   Filed Vitals:   03/05/16 0950  BP: 119/54  Pulse: 84   Temp: 98.4 F (36.9 C)    Physical Exam: Constitutional: Patient appears well-developed and well-nourished. No distress. HENT: Normocephalic, atraumatic, External right and left ear normal. Oropharynx is clear and moist.  Eyes: Conjunctivae and EOM are normal. PERRLA, no scleral icterus. Neck: Normal ROM. Neck supple. No lymphadenopathy, No thyromegaly. CVS: RRR, S1/S2 +, no murmurs, no gallops, no rubs Pulmonary: Effort and breath sounds normal, no stridor, rhonchi, wheezes, rales.  Abdominal: Soft. Normoactive BS,, no distension, tenderness, rebound or guarding.  Musculoskeletal: Normal range of motion. No edema and no tenderness.  Neuro: Alert.Normal muscle tone coordination. Non-focal. Grip on left slightly weaker, no arm drift, no gait problem noted. Skin: Skin is warm and dry. No rash noted. Not diaphoretic. No erythema. No pallor. Psychiatric: Normal mood and affect. Behavior, judgment, thought content normal.  Lab Results  Component Value Date   WBC 5.4 09/05/2015   HGB 11.5* 09/05/2015   HCT 35.3* 09/05/2015   MCV 96.7 09/05/2015   PLT 252 09/05/2015   Lab Results  Component Value Date   CREATININE 0.78 09/05/2015   BUN 8 09/05/2015   NA 144 09/05/2015   K 3.8 09/05/2015   CL 104 09/05/2015   CO2 29 09/05/2015    Lab Results  Component Value Date   HGBA1C 6.3* 09/05/2015   Lipid Panel     Component Value Date/Time   CHOL 209* 09/05/2015 1217   TRIG 180* 09/05/2015 1217   HDL 72 09/05/2015 1217   CHOLHDL 2.9 09/05/2015 1217   VLDL 36* 09/05/2015 1217   LDLCALC 101 09/05/2015 1217       Assessment and plan:   1. Need for hepatitis C screening test  - Hepatitis C antibody, reflex  2. Need for prophylactic vaccination against Streptococcus pneumoniae (pneumococcus) - Pneumococcal polysaccharide vaccine 23-valent greater than or equal to 2yo subcutaneous/IM  3. Diabetes mellitus of other type without complication (HCC)  - COMPLETE METABOLIC PANEL  WITH GFR - CBC with Differential - Lipid panel - sitaGLIPtin (JANUVIA) 100 MG tablet; Take 1 tablet (100 mg total) by mouth daily.  Dispense: 30 tablet; Refill: 3 - rosuvastatin (CRESTOR) 20 MG tablet; Take 1 tablet (20 mg total) by mouth daily.  Dispense: 30 tablet; Refill: 3 - ACCU-CHEK AVIVA PLUS test strip; Reported on 02/24/2016  Dispense: 100 each; Refill: 1 - ACCU-CHEK SOFTCLIX LANCETS lancets; Check BS once or twice a day.  Dispense: 100 each; Refill: 1  4. Essential hypertension  - TSH - amLODipine (NORVASC) 5 MG tablet; Take 1 tablet (5 mg total) by mouth daily.  Dispense: 30 tablet; Refill: 3  5. Breast cancer screening  - MM DIGITAL SCREENING BILATERAL; Future  6. Osteoporosis screening  - DG Bone Density; Future   Return in about 6 months (around 09/04/2016).  The patient was given clear instructions to go to ER or return to medical center if symptoms don't improve, worsen or new problems develop. The patient verbalized understanding.    Micheline Chapman  FNP  03/05/2016, 11:53 AM

## 2016-03-05 NOTE — Patient Instructions (Signed)
Make an appointment to come in for PAP smear. Return for follow-up in 6 months.  Eat a diet low in fats and carbohydrates. Try to exercise a little each day. Walking on flat surface is good.

## 2016-03-06 ENCOUNTER — Other Ambulatory Visit: Payer: Self-pay | Admitting: Family Medicine

## 2016-03-06 ENCOUNTER — Other Ambulatory Visit: Payer: Self-pay

## 2016-03-06 DIAGNOSIS — E2839 Other primary ovarian failure: Secondary | ICD-10-CM

## 2016-03-06 DIAGNOSIS — Z1231 Encounter for screening mammogram for malignant neoplasm of breast: Secondary | ICD-10-CM

## 2016-03-06 LAB — HEPATITIS C ANTIBODY: HCV AB: NEGATIVE

## 2016-03-06 MED ORDER — ACCU-CHEK AVIVA PLUS W/DEVICE KIT: 1.0000 | PACK | Status: DC

## 2016-03-06 NOTE — Telephone Encounter (Signed)
Patient called requesting a aviva glucometer kit.  Rx sent to pharmacy patient aware.

## 2016-03-20 ENCOUNTER — Telehealth: Payer: Self-pay

## 2016-03-20 DIAGNOSIS — I10 Essential (primary) hypertension: Secondary | ICD-10-CM

## 2016-03-20 MED ORDER — AMLODIPINE BESYLATE 5 MG PO TABS: 5.0000 mg | ORAL_TABLET | Freq: Every day | ORAL | Status: DC

## 2016-03-20 NOTE — Telephone Encounter (Signed)
Pt is requesting a medication refill on her BP medicine. Thanks!

## 2016-03-20 NOTE — Telephone Encounter (Signed)
bp med refilled and sent into pharmacy. Thanks!

## 2016-03-21 ENCOUNTER — Other Ambulatory Visit (HOSPITAL_COMMUNITY)
Admission: RE | Admit: 2016-03-21 | Discharge: 2016-03-21 | Disposition: A | Discharge status: Home / Self Care | Payer: Medicare Other | Source: Ambulatory Visit | Attending: Family Medicine | Admitting: Family Medicine

## 2016-03-21 ENCOUNTER — Ambulatory Visit (INDEPENDENT_AMBULATORY_CARE_PROVIDER_SITE_OTHER): Payer: Medicare Other | Admitting: Family Medicine

## 2016-03-21 VITALS — BP 125/57 | HR 83 | Temp 98.6°F | Resp 16 | Ht 62.0 in | Wt 153.0 lb

## 2016-03-21 DIAGNOSIS — Z01419 Encounter for gynecological examination (general) (routine) without abnormal findings: Secondary | ICD-10-CM | POA: Diagnosis present

## 2016-03-21 DIAGNOSIS — Z124 Encounter for screening for malignant neoplasm of cervix: Secondary | ICD-10-CM

## 2016-03-21 DIAGNOSIS — E138 Other specified diabetes mellitus with unspecified complications: Secondary | ICD-10-CM | POA: Diagnosis not present

## 2016-03-21 DIAGNOSIS — Z1151 Encounter for screening for human papillomavirus (HPV): Secondary | ICD-10-CM | POA: Diagnosis present

## 2016-03-21 DIAGNOSIS — E139 Other specified diabetes mellitus without complications: Secondary | ICD-10-CM | POA: Diagnosis not present

## 2016-03-21 LAB — HEMOGLOBIN A1C
Hgb A1c MFr Bld: 6.2 % — ABNORMAL HIGH (ref <5.7)
Mean Plasma Glucose: 131 mg/dL

## 2016-03-21 MED ORDER — ROSUVASTATIN CALCIUM 20 MG PO TABS: 20.0000 mg | ORAL_TABLET | Freq: Every day | ORAL | Status: DC

## 2016-03-21 NOTE — Patient Instructions (Signed)
We will let you know if any abnormalities are found.

## 2016-03-21 NOTE — Progress Notes (Signed)
Patient ID: Beth Hubbard, female   DOB: 01-08-1946, 70 y.o.   MRN: [id number]   Beth Hubbard, is a 70 y.o. female  [id number]  [id number]  DOB - June 08, 1946  CC:  Chief Complaint  Patient presents with  . Gynecologic Exam       HPI: Beth Hubbard is a 70 y.o. female here for PAP smear. She has not had a PAP smear in a number of years. She is not aware of ever having an abnormal PAP. She has a mammogram and bone density pending. I find in reviewing her recent labs that we failed to get a HgbA1C or Urine micral when she was here last month. She also needs a refill on Crestor. She reports her last menstural period about 20 years ago and reports no abnormal vaginal bleeding or discharge.  Allergies  Allergen Reactions  . Aspirin     Tremor   . Penicillins Itching and Other (See Comments)    Reaction: itching,headache, dizziness and anxiety. Feels like she is "goin off"  . Nsaids Anxiety    Reaction: causes dizziness and headache. Ibuprofen. Aspirin   Past Medical History  Diagnosis Date  . Shingles   . TIA (transient ischemic attack)   . Hypertension   . Diabetes mellitus without complication ([place])   . High cholesterol   . Mitral regurgitation     moderate by echo 2015  . Excessive daytime sleepiness   . OSA (obstructive sleep apnea) 10/18/2015    Mild OSA with AHI 11/hr   Current Outpatient Prescriptions on File Prior to Visit  Medication Sig Dispense Refill  . ACCU-CHEK AVIVA PLUS test strip Reported on 02/24/2016 100 each 1  . ACCU-CHEK SOFTCLIX LANCETS lancets Check BS once or twice a day. 100 each 1  . amLODipine (NORVASC) 5 MG tablet Take 1 tablet (5 mg total) by mouth daily. 30 tablet 3  . Blood Glucose Monitoring Suppl (ACCU-CHEK AVIVA PLUS) w/Device KIT 1 kit by Does not apply route as directed. Check 1 to 2 times daily 1 kit 0  . COMBIGAN 0.2-0.5 % ophthalmic solution Place 1 drop into the left eye 2 (two) times daily.    . ferrous sulfate 325 (65 FE) MG tablet  Take 1 tablet (325 mg total) by mouth daily with breakfast. 30 tablet 3  . folic acid (FOLVITE) 062 MCG tablet Take 400 mcg by mouth daily.    Marland Kitchen HYDROcodone-acetaminophen (NORCO/VICODIN) 5-325 MG tablet Take 1 tablet by mouth as directed.  0  . LUMIGAN 0.01 % SOLN Place 1 drop into both eyes at bedtime.  0  . sitaGLIPtin (JANUVIA) 100 MG tablet Take 1 tablet (100 mg total) by mouth daily. 30 tablet 3  . sulindac (CLINORIL) 200 MG tablet Take 200 mg by mouth 2 (two) times daily.     No current facility-administered medications on file prior to visit.   Family History  Problem Relation Age of Onset  . Cancer Father   . Cancer Sister   . Cancer Brother    Social History   Social History  . Marital Status: Legally Separated    Spouse Name: N/A  . Number of Children: N/A  . Years of Education: N/A   Occupational History  . Not on file.   Social History Main Topics  . Smoking status: Never Smoker   . Smokeless tobacco: Not on file  . Alcohol Use: No  . Drug Use: No  . Sexual Activity: Not on file   Other Topics  Concern  . Not on file   Social History Narrative    Review of Systems:  Deferred as here just to collect PAP  Objective:   Filed Vitals:   03/21/16 0954  BP: 125/57  Pulse: 83  Temp: 98.6 F (37 C)  Resp: 16    Physical Exam: Constitutional: Patient appears well-developed and well-nourished. No distress. Neuro: Alert.Normal muscle tone coordination. Non-focal Skin: Skin is warm and dry. No rash noted. Not diaphoretic. No erythema. No pallor. Psychiatric: Normal mood and affect. Behavior, judgment, thought content normal. GYN: The cervix appears normal but is friable. There is no CMT or adnexal tenderness. There is no unusual appearing discharge.  Lab Results  Component Value Date   WBC 6.2 03/05/2016   HGB 11.1* 03/05/2016   HCT 34.4* 03/05/2016   MCV 97.7 03/05/2016   PLT 234 03/05/2016   Lab Results  Component Value Date   CREATININE 0.89  03/05/2016   BUN 11 03/05/2016   NA 142 03/05/2016   K 3.9 03/05/2016   CL 103 03/05/2016   CO2 27 03/05/2016    Lab Results  Component Value Date   HGBA1C 6.3* 09/05/2015   Lipid Panel     Component Value Date/Time   CHOL 202* 03/05/2016 1027   TRIG 171* 03/05/2016 1027   HDL 73 03/05/2016 1027   CHOLHDL 2.8 03/05/2016 1027   VLDL 34* 03/05/2016 1027   LDLCALC 95 03/05/2016 1027       Assessment and plan:   1. Diabetes mellitus of other type with complication (HCC)   - Microalbumin, urine - Hemoglobin  2. Cervical cancer screening  - Cytology - PAP [place]  3. Hyperlipidemia  - rosuvastatin (CRESTOR) 20 MG tablet; Take 1 tablet (20 mg total) by mouth daily.  Dispense: 30 tablet; Refill: 3   October 27, appt already made.  The patient was given clear instructions to go to ER or return to medical center if symptoms don't improve, worsen or new problems develop. The patient verbalized understanding.    Beth Chapman FNP  03/21/2016, 10:32 AM

## 2016-03-22 LAB — MICROALBUMIN, URINE: MICROALB UR: 0.3 mg/dL

## 2016-03-23 LAB — CYTOLOGY - PAP

## 2016-03-28 ENCOUNTER — Other Ambulatory Visit: Payer: Self-pay | Admitting: Family Medicine

## 2016-03-28 ENCOUNTER — Other Ambulatory Visit: Payer: Self-pay

## 2016-03-28 DIAGNOSIS — E2839 Other primary ovarian failure: Secondary | ICD-10-CM

## 2016-03-29 ENCOUNTER — Ambulatory Visit
Admission: RE | Admit: 2016-03-29 | Discharge: 2016-03-29 | Disposition: A | Discharge status: Home / Self Care | Payer: Medicare Other | Source: Ambulatory Visit | Attending: Family Medicine | Admitting: Family Medicine

## 2016-03-29 DIAGNOSIS — Z1231 Encounter for screening mammogram for malignant neoplasm of breast: Secondary | ICD-10-CM

## 2016-03-29 DIAGNOSIS — E2839 Other primary ovarian failure: Secondary | ICD-10-CM

## 2016-05-21 ENCOUNTER — Telehealth: Payer: Self-pay | Admitting: Cardiology

## 2016-05-21 NOTE — Telephone Encounter (Signed)
New Message:   Pressure is too strong on her sleep machine,pressure needs to be reduced.

## 2016-05-23 NOTE — Telephone Encounter (Signed)
Patient states that she has not been wearing her machine.  I encouraged her to wear it for a couple weeks so that we can get a download and see where she is at.  She stated verbal understanding and said that she would start wearing it tonight.

## 2016-05-23 NOTE — Telephone Encounter (Signed)
Patient should be on 5cm H2O. Please get a download to review

## 2016-05-25 ENCOUNTER — Ambulatory Visit (HOSPITAL_COMMUNITY): Payer: Medicare Other

## 2016-06-06 ENCOUNTER — Other Ambulatory Visit: Payer: Self-pay | Admitting: Family Medicine

## 2016-06-06 MED ORDER — ALENDRONATE SODIUM 70 MG PO TABS: 70.0000 mg | ORAL_TABLET | ORAL | Status: DC

## 2016-06-11 ENCOUNTER — Other Ambulatory Visit (HOSPITAL_COMMUNITY): Payer: Self-pay

## 2016-06-11 ENCOUNTER — Ambulatory Visit (HOSPITAL_COMMUNITY): Payer: Medicare Other | Attending: Internal Medicine

## 2016-06-11 DIAGNOSIS — I340 Nonrheumatic mitral (valve) insufficiency: Secondary | ICD-10-CM

## 2016-06-11 DIAGNOSIS — E119 Type 2 diabetes mellitus without complications: Secondary | ICD-10-CM | POA: Diagnosis not present

## 2016-06-11 DIAGNOSIS — E785 Hyperlipidemia, unspecified: Secondary | ICD-10-CM | POA: Diagnosis not present

## 2016-06-11 DIAGNOSIS — G4733 Obstructive sleep apnea (adult) (pediatric): Secondary | ICD-10-CM | POA: Insufficient documentation

## 2016-06-11 DIAGNOSIS — I119 Hypertensive heart disease without heart failure: Secondary | ICD-10-CM | POA: Insufficient documentation

## 2016-06-28 ENCOUNTER — Telehealth: Payer: Self-pay | Admitting: Cardiology

## 2016-06-28 ENCOUNTER — Encounter: Payer: Self-pay | Admitting: Cardiology

## 2016-06-28 ENCOUNTER — Ambulatory Visit (INDEPENDENT_AMBULATORY_CARE_PROVIDER_SITE_OTHER): Payer: Medicare Other | Admitting: Cardiology

## 2016-06-28 VITALS — BP 122/70 | HR 88 | Ht 63.0 in | Wt 158.1 lb

## 2016-06-28 DIAGNOSIS — I10 Essential (primary) hypertension: Secondary | ICD-10-CM

## 2016-06-28 DIAGNOSIS — I340 Nonrheumatic mitral (valve) insufficiency: Secondary | ICD-10-CM

## 2016-06-28 DIAGNOSIS — E139 Other specified diabetes mellitus without complications: Secondary | ICD-10-CM | POA: Diagnosis not present

## 2016-06-28 LAB — CBC
HCT: 33.6 % — ABNORMAL LOW (ref 35.0–45.0)
HEMOGLOBIN: 11.2 g/dL — AB (ref 11.7–15.5)
MCH: 31.4 pg (ref 27.0–33.0)
MCHC: 33.3 g/dL (ref 32.0–36.0)
MCV: 94.1 fL (ref 80.0–100.0)
MPV: 10.4 fL (ref 7.5–12.5)
PLATELETS: 268 10*3/uL (ref 140–400)
RBC: 3.57 MIL/uL — AB (ref 3.80–5.10)
RDW: 13.7 % (ref 11.0–15.0)
WBC: 7.2 10*3/uL (ref 3.8–10.8)

## 2016-06-28 LAB — BASIC METABOLIC PANEL
BUN: 15 mg/dL (ref 7–25)
CALCIUM: 9.4 mg/dL (ref 8.6–10.4)
CO2: 33 mmol/L — AB (ref 20–31)
Chloride: 101 mmol/L (ref 98–110)
Creat: 0.88 mg/dL (ref 0.60–0.93)
Glucose, Bld: 95 mg/dL (ref 65–99)
Potassium: 4.3 mmol/L (ref 3.5–5.3)
SODIUM: 140 mmol/L (ref 135–146)

## 2016-06-28 MED ORDER — AMLODIPINE BESYLATE 5 MG PO TABS: 5.0000 mg | ORAL_TABLET | Freq: Every day | ORAL | 3 refills | Status: DC

## 2016-06-28 MED ORDER — ROSUVASTATIN CALCIUM 20 MG PO TABS: 20.0000 mg | ORAL_TABLET | Freq: Every day | ORAL | 3 refills | Status: DC

## 2016-06-28 NOTE — H&P (Signed)
Cardiology Office Note   Date:  06/28/2016   ID:  Latoia, Eyster [birth year], MRN [id number]  PCP:  Sharon Seller, NP  Cardiologist:  Dr. Radford Pax    Chief Complaint  Patient presents with  . Mitral Regurgitation    severe      History of Present Illness: Beth Hubbard is a 70 y.o. female who presents for results of Echo that reveals severe MR.    She has a history of DM, dyslipidemia, no ischemia on nuclear stress test, moderate MR with normal LVF by echo, OSA and HTN. per Dr. Radford Pax "She recently had a repeat sleep study which showed mild OSA with an AHI of 11.9/hr with oxygen desaturations as low as 83%.  She was noted to have PVCs on telemetry during the test.  She underwent CPAP titration to 7cm H2O.  She says that the pressure is blowing the nasal pillow mask off of her nose.  Since she has been on CPAP she is sleeping better at night and feels restored in the am and has no daytime sleepiness."   She denies any anginal chest pain, SOB, DOE (execept with walking fast), LE edema, dizziness, palpitations or syncope.   She recently had Echo to eval her MR.   Her EF was 65-70% G1DD, Mitral valve with severe MR and directed posterior into the LA LA is moderately dilated.  PA pk pressure 35 mmHG.  Dr. Radford Pax reviewed and would like pt to have TEE for further eval.   Pt has no complaints.      Past Medical History:  Diagnosis Date  . Diabetes mellitus without complication ([place])   . Excessive daytime sleepiness   . High cholesterol   . Hypertension   . Mitral regurgitation    moderate by echo 2015  . OSA (obstructive sleep apnea) 10/18/2015   Mild OSA with AHI 11/hr  . Shingles   . TIA (transient ischemic attack)     Past Surgical History:  Procedure Laterality Date  . ABDOMINAL SURGERY    . ROTATOR CUFF REPAIR     bilateral     Current Outpatient Prescriptions  Medication Sig Dispense Refill  . ACCU-CHEK AVIVA PLUS test strip Reported on 02/24/2016 100 each 1    . ACCU-CHEK SOFTCLIX LANCETS lancets Check BS once or twice a day. 100 each 1  . alendronate (FOSAMAX) 70 MG tablet Take 1 tablet (70 mg total) by mouth once a week. Take with a full glass of water on an empty stomach. 12 tablet 3  . amLODipine (NORVASC) 5 MG tablet Take 1 tablet (5 mg total) by mouth daily. 30 tablet 3  . Blood Glucose Monitoring Suppl (ACCU-CHEK AVIVA PLUS) w/Device KIT 1 kit by Does not apply route as directed. Check 1 to 2 times daily 1 kit 0  . Calcium Carbonate-Vitamin D (CALCIUM 600/VITAMIN D) 600-400 MG-UNIT chew tablet Chew 1 tablet by mouth daily.    . COMBIGAN 0.2-0.5 % ophthalmic solution Place 1 drop into the left eye 2 (two) times daily.    . cyclobenzaprine (FLEXERIL) 10 MG tablet Take 10 mg by mouth 3 (three) times daily as needed for muscle spasms.    . ferrous sulfate 325 (65 FE) MG tablet Take 1 tablet (325 mg total) by mouth daily with breakfast. 30 tablet 3  . folic acid (FOLVITE) 952 MCG tablet Take 400 mcg by mouth daily.    Marland Kitchen HYDROcodone-acetaminophen (NORCO/VICODIN) 5-325 MG tablet Take 1 tablet by mouth as directed.  0  . LUMIGAN 0.01 % SOLN Place 1 drop into both eyes at bedtime.  0  . rosuvastatin (CRESTOR) 20 MG tablet Take 1 tablet (20 mg total) by mouth daily. 30 tablet 3  . sitaGLIPtin (JANUVIA) 100 MG tablet Take 1 tablet (100 mg total) by mouth daily. 30 tablet 3  . sulindac (CLINORIL) 200 MG tablet Take 200 mg by mouth 2 (two) times daily.     No current facility-administered medications for this visit.     Allergies:   Aspirin; Penicillins; and Nsaids    Social History:  The patient  reports that she has never smoked. She does not have any smokeless tobacco history on file. She reports that she does not drink alcohol or use drugs.   Family History:  The patient's family history includes Cancer in her brother, father, and sister.    ROS:  General:no colds or fevers, no weight changes Skin:no rashes or ulcers HEENT:no blurred vision, no  congestion CV:see HPI PUL:see HPI GI:no diarrhea constipation or melena, no indigestion GU:no hematuria, no dysuria MS:no joint pain, no claudication Neuro:no syncope, no lightheadedness Endo:no diabetes, no thyroid disease  Wt Readings from Last 3 Encounters:  06/28/16 158 lb 1.9 oz (71.7 kg)  03/21/16 153 lb (69.4 kg)  03/05/16 150 lb (68 kg)     PHYSICAL EXAM: VS:  BP 122/70   Pulse 88   Ht _0  (1.6 m)   Wt 158 lb 1.9 oz (71.7 kg)   BMI 28.01 kg/m  , BMI Body mass index is 28.01 kg/m. General:Pleasant affect, NAD Skin:Warm and dry, brisk capillary refill HEENT:normocephalic, sclera clear, mucus membranes moist Neck:supple, no JVD, no bruits  Heart:S1S2 RRR without murmur, gallup, rub or click Lungs:clear without rales, rhonchi, or wheezes XIH:WTUU, non tender, + BS, do not palpate liver spleen or masses Ext:no lower ext edema, 2+ pedal pulses, 2+ radial pulses Neuro:alert and oriented, MAE, follows commands, + facial symmetry    EKG:  EKG is Not ordered today.   Recent Labs: 03/05/2016: ALT 12; TSH 1.27 06/28/2016: BUN 15; Creat 0.88; Hemoglobin 11.2; Platelets 268; Potassium 4.3; Sodium 140    Lipid Panel    Component Value Date/Time   CHOL 202 (H) 03/05/2016 1027   TRIG 171 (H) 03/05/2016 1027   HDL 73 03/05/2016 1027   CHOLHDL 2.8 03/05/2016 1027   VLDL 34 (H) 03/05/2016 1027   [place] 95 03/05/2016 1027       Other studies Reviewed: Additional studies/ records that were reviewed today include:  Echo Study Conclusions  - Left ventricle: The cavity size was normal. Wall thickness was   normal. Systolic function was vigorous. The estimated ejection   fraction was in the range of 65% to 70%. Doppler parameters are   consistent with abnormal left ventricular relaxation (grade 1   diastolic dysfunction). - Mitral valve: MR is directed posterior into LA There was severe   regurgitation. - Left atrium: The atrium was moderately dilated. - Pulmonary  arteries: PA peak pressure: 35 mm Hg (S).  Impressions:  - Compared to previous report from 2016, MR appears more severe   ASSESSMENT AND PLAN:  1.  Severe MR, no acute symptoms.  Discussed TEE and why we would like to perform.  All questions answered.    [facility] has been requested to perform a transesophageal echocardiogram on Beth Hubbard for 07/11/16.  After careful review of history and examination, the risks and benefits of transesophageal echocardiogram have been explained including  risks of esophageal damage, perforation (1:10,000 risk), bleeding, pharyngeal hematoma as well as other potential complications associated with conscious sedation including aspiration, arrhythmia, respiratory failure and death. Alternatives to treatment were discussed, questions were answered. Patient is willing to proceed.   2. HTN controlled  Essential HTN Current medicines are reviewed with the patient today.  The patient Has no concerns regarding medicines.  The following changes have been made:  See above Labs/ tests ordered today include:see above  Disposition:   FU:  see above  Signed, Cecilie Kicks, NP  06/28/2016 10:31 PM    [address], [place], [address] [place] [place], [place] Phone: [phone]; Fax: [phone]

## 2016-06-28 NOTE — Telephone Encounter (Signed)
New message     Patient has to reschedule her procedure on tomorrow due to no transportation to get to the hospital .

## 2016-06-28 NOTE — Patient Instructions (Signed)
Medication Instructions:  Your physician recommends that you continue on your current medications as directed. Please refer to the Current Medication list given to you today.  Labwork: Today: bmet, cbc, pt/inr  Testing/Procedures: Your physician has requested that you have a TEE. During a TEE, sound waves are used to create images of your heart. It provides your doctor with information about the size and shape of your heart and how well your heart's chambers and valves are working. In this test, a transducer is attached to the end of a flexible tube that's guided down your throat and into your esophagus (the tube leading from you mouth to your stomach) to get a more detailed image of your heart. You are not awake for the procedure. Please see the instruction sheet given to you today. For further information please visit [email or web address].   Follow-Up: Your physician recommends that you schedule a follow-up appointment in: with [facility] PREFERABLY AND IF UNAVAILABLE WITH FIRST AVAILABLE.  Any Other Special Instructions Will Be Listed Below (If Applicable).     If you need a refill on your cardiac medications before your next appointment, please call your pharmacy.

## 2016-06-28 NOTE — Telephone Encounter (Signed)
Returned pts call.  TEE has been cancelled and r/s for 07/10/16 with Dr. Radford Pax, with pt arriving at 7:30,  Pt agreeable with this plan.

## 2016-06-29 LAB — PROTIME-INR
INR: 1.1
PROTHROMBIN TIME: 11.2 s (ref 9.0–11.5)

## 2016-07-10 ENCOUNTER — Encounter (HOSPITAL_COMMUNITY)
Admission: RE | Disposition: A | Discharge status: Home / Self Care | Payer: Self-pay | Source: Ambulatory Visit | Attending: Cardiology

## 2016-07-10 ENCOUNTER — Telehealth: Payer: Self-pay | Admitting: Cardiology

## 2016-07-10 ENCOUNTER — Ambulatory Visit (HOSPITAL_COMMUNITY): Payer: Medicare Other

## 2016-07-10 ENCOUNTER — Encounter (HOSPITAL_COMMUNITY): Payer: Self-pay

## 2016-07-10 ENCOUNTER — Ambulatory Visit (HOSPITAL_COMMUNITY)
Admission: RE | Admit: 2016-07-10 | Discharge: 2016-07-10 | Disposition: A | Discharge status: Home / Self Care | Payer: Medicare Other | Source: Ambulatory Visit | Attending: Cardiology | Admitting: Cardiology

## 2016-07-10 DIAGNOSIS — E7800 Pure hypercholesterolemia, unspecified: Secondary | ICD-10-CM | POA: Diagnosis not present

## 2016-07-10 DIAGNOSIS — E785 Hyperlipidemia, unspecified: Secondary | ICD-10-CM | POA: Diagnosis not present

## 2016-07-10 DIAGNOSIS — Z7984 Long term (current) use of oral hypoglycemic drugs: Secondary | ICD-10-CM | POA: Diagnosis not present

## 2016-07-10 DIAGNOSIS — Z7983 Long term (current) use of bisphosphonates: Secondary | ICD-10-CM | POA: Diagnosis not present

## 2016-07-10 DIAGNOSIS — Z79899 Other long term (current) drug therapy: Secondary | ICD-10-CM | POA: Diagnosis not present

## 2016-07-10 DIAGNOSIS — I10 Essential (primary) hypertension: Secondary | ICD-10-CM | POA: Diagnosis not present

## 2016-07-10 DIAGNOSIS — G4733 Obstructive sleep apnea (adult) (pediatric): Secondary | ICD-10-CM | POA: Insufficient documentation

## 2016-07-10 DIAGNOSIS — Z8673 Personal history of transient ischemic attack (TIA), and cerebral infarction without residual deficits: Secondary | ICD-10-CM | POA: Insufficient documentation

## 2016-07-10 DIAGNOSIS — E119 Type 2 diabetes mellitus without complications: Secondary | ICD-10-CM | POA: Diagnosis not present

## 2016-07-10 DIAGNOSIS — I340 Nonrheumatic mitral (valve) insufficiency: Secondary | ICD-10-CM | POA: Insufficient documentation

## 2016-07-10 LAB — GLUCOSE, CAPILLARY: GLUCOSE-CAPILLARY: 112 mg/dL — AB (ref 65–99)

## 2016-07-10 SURGERY — INVASIVE LAB ABORTED CASE

## 2016-07-10 MED ORDER — MIDAZOLAM HCL 5 MG/ML IJ SOLN
INTRAMUSCULAR | Status: AC
  Filled 2016-07-10: qty 2

## 2016-07-10 MED ORDER — BUTAMBEN-TETRACAINE-BENZOCAINE 2-2-14 % EX AERO
INHALATION_SPRAY | CUTANEOUS | Status: DC | PRN
  Administered 2016-07-10: 2 via TOPICAL

## 2016-07-10 MED ORDER — MIDAZOLAM HCL 10 MG/2ML IJ SOLN
INTRAMUSCULAR | Status: DC | PRN
  Administered 2016-07-10 (×3): 1 mg via INTRAVENOUS
  Administered 2016-07-10: 2 mg via INTRAVENOUS

## 2016-07-10 MED ORDER — SODIUM CHLORIDE 0.9 % IV SOLN: INTRAVENOUS | Status: DC

## 2016-07-10 MED ORDER — FENTANYL CITRATE (PF) 100 MCG/2ML IJ SOLN
INTRAMUSCULAR | Status: DC | PRN
  Administered 2016-07-10: 12.5 ug via INTRAVENOUS
  Administered 2016-07-10 (×2): 25 ug via INTRAVENOUS

## 2016-07-10 MED ORDER — FENTANYL CITRATE (PF) 100 MCG/2ML IJ SOLN
INTRAMUSCULAR | Status: AC
  Filled 2016-07-10: qty 2

## 2016-07-10 NOTE — H&P (View-Only) (Signed)
Cardiology Office Note   Date:  06/28/2016   ID:  Beth Hubbard, Beth Hubbard [birth year], MRN [id number]  PCP:  Sharon Seller, NP  Cardiologist:  Dr. Radford Pax    Chief Complaint  Patient presents with  . Mitral Regurgitation    severe      History of Present Illness: Beth Hubbard is a 70 y.o. female who presents for results of Echo that reveals severe MR.    She has a history of DM, dyslipidemia, no ischemia on nuclear stress test, moderate MR with normal LVF by echo, OSA and HTN. per Dr. Radford Pax "She recently had a repeat sleep study which showed mild OSA with an AHI of 11.9/hr with oxygen desaturations as low as 83%.  She was noted to have PVCs on telemetry during the test.  She underwent CPAP titration to 7cm H2O.  She says that the pressure is blowing the nasal pillow mask off of her nose.  Since she has been on CPAP she is sleeping better at night and feels restored in the am and has no daytime sleepiness."   She denies any anginal chest pain, SOB, DOE (execept with walking fast), LE edema, dizziness, palpitations or syncope.   She recently had Echo to eval her MR.   Her EF was 65-70% G1DD, Mitral valve with severe MR and directed posterior into the LA LA is moderately dilated.  PA pk pressure 35 mmHG.  Dr. Radford Pax reviewed and would like pt to have TEE for further eval.   Pt has no complaints.      Past Medical History:  Diagnosis Date  . Diabetes mellitus without complication ([place])   . Excessive daytime sleepiness   . High cholesterol   . Hypertension   . Mitral regurgitation    moderate by echo 2015  . OSA (obstructive sleep apnea) 10/18/2015   Mild OSA with AHI 11/hr  . Shingles   . TIA (transient ischemic attack)     Past Surgical History:  Procedure Laterality Date  . ABDOMINAL SURGERY    . ROTATOR CUFF REPAIR     bilateral     Current Outpatient Prescriptions  Medication Sig Dispense Refill  . ACCU-CHEK AVIVA PLUS test strip Reported on 02/24/2016 100 each 1    . ACCU-CHEK SOFTCLIX LANCETS lancets Check BS once or twice a day. 100 each 1  . alendronate (FOSAMAX) 70 MG tablet Take 1 tablet (70 mg total) by mouth once a week. Take with a full glass of water on an empty stomach. 12 tablet 3  . amLODipine (NORVASC) 5 MG tablet Take 1 tablet (5 mg total) by mouth daily. 30 tablet 3  . Blood Glucose Monitoring Suppl (ACCU-CHEK AVIVA PLUS) w/Device KIT 1 kit by Does not apply route as directed. Check 1 to 2 times daily 1 kit 0  . Calcium Carbonate-Vitamin D (CALCIUM 600/VITAMIN D) 600-400 MG-UNIT chew tablet Chew 1 tablet by mouth daily.    . COMBIGAN 0.2-0.5 % ophthalmic solution Place 1 drop into the left eye 2 (two) times daily.    . cyclobenzaprine (FLEXERIL) 10 MG tablet Take 10 mg by mouth 3 (three) times daily as needed for muscle spasms.    . ferrous sulfate 325 (65 FE) MG tablet Take 1 tablet (325 mg total) by mouth daily with breakfast. 30 tablet 3  . folic acid (FOLVITE) 952 MCG tablet Take 400 mcg by mouth daily.    Marland Kitchen HYDROcodone-acetaminophen (NORCO/VICODIN) 5-325 MG tablet Take 1 tablet by mouth as directed.  0  . LUMIGAN 0.01 % SOLN Place 1 drop into both eyes at bedtime.  0  . rosuvastatin (CRESTOR) 20 MG tablet Take 1 tablet (20 mg total) by mouth daily. 30 tablet 3  . sitaGLIPtin (JANUVIA) 100 MG tablet Take 1 tablet (100 mg total) by mouth daily. 30 tablet 3  . sulindac (CLINORIL) 200 MG tablet Take 200 mg by mouth 2 (two) times daily.     No current facility-administered medications for this visit.     Allergies:   Aspirin; Penicillins; and Nsaids    Social History:  The patient  reports that she has never smoked. She does not have any smokeless tobacco history on file. She reports that she does not drink alcohol or use drugs.   Family History:  The patient's family history includes Cancer in her brother, father, and sister.    ROS:  General:no colds or fevers, no weight changes Skin:no rashes or ulcers HEENT:no blurred vision, no  congestion CV:see HPI PUL:see HPI GI:no diarrhea constipation or melena, no indigestion GU:no hematuria, no dysuria MS:no joint pain, no claudication Neuro:no syncope, no lightheadedness Endo:no diabetes, no thyroid disease  Wt Readings from Last 3 Encounters:  06/28/16 158 lb 1.9 oz (71.7 kg)  03/21/16 153 lb (69.4 kg)  03/05/16 150 lb (68 kg)     PHYSICAL EXAM: VS:  BP 122/70   Pulse 88   Ht _0  (1.6 m)   Wt 158 lb 1.9 oz (71.7 kg)   BMI 28.01 kg/m  , BMI Body mass index is 28.01 kg/m. General:Pleasant affect, NAD Skin:Warm and dry, brisk capillary refill HEENT:normocephalic, sclera clear, mucus membranes moist Neck:supple, no JVD, no bruits  Heart:S1S2 RRR without murmur, gallup, rub or click Lungs:clear without rales, rhonchi, or wheezes XIH:WTUU, non tender, + BS, do not palpate liver spleen or masses Ext:no lower ext edema, 2+ pedal pulses, 2+ radial pulses Neuro:alert and oriented, MAE, follows commands, + facial symmetry    EKG:  EKG is Not ordered today.   Recent Labs: 03/05/2016: ALT 12; TSH 1.27 06/28/2016: BUN 15; Creat 0.88; Hemoglobin 11.2; Platelets 268; Potassium 4.3; Sodium 140    Lipid Panel    Component Value Date/Time   CHOL 202 (H) 03/05/2016 1027   TRIG 171 (H) 03/05/2016 1027   HDL 73 03/05/2016 1027   CHOLHDL 2.8 03/05/2016 1027   VLDL 34 (H) 03/05/2016 1027   [place] 95 03/05/2016 1027       Other studies Reviewed: Additional studies/ records that were reviewed today include:  Echo Study Conclusions  - Left ventricle: The cavity size was normal. Wall thickness was   normal. Systolic function was vigorous. The estimated ejection   fraction was in the range of 65% to 70%. Doppler parameters are   consistent with abnormal left ventricular relaxation (grade 1   diastolic dysfunction). - Mitral valve: MR is directed posterior into LA There was severe   regurgitation. - Left atrium: The atrium was moderately dilated. - Pulmonary  arteries: PA peak pressure: 35 mm Hg (S).  Impressions:  - Compared to previous report from 2016, MR appears more severe   ASSESSMENT AND PLAN:  1.  Severe MR, no acute symptoms.  Discussed TEE and why we would like to perform.  All questions answered.    [facility] has been requested to perform a transesophageal echocardiogram on Beth Hubbard for 07/11/16.  After careful review of history and examination, the risks and benefits of transesophageal echocardiogram have been explained including  risks of esophageal damage, perforation (1:10,000 risk), bleeding, pharyngeal hematoma as well as other potential complications associated with conscious sedation including aspiration, arrhythmia, respiratory failure and death. Alternatives to treatment were discussed, questions were answered. Patient is willing to proceed.   2. HTN controlled  Essential HTN Current medicines are reviewed with the patient today.  The patient Has no concerns regarding medicines.  The following changes have been made:  See above Labs/ tests ordered today include:see above  Disposition:   FU:  see above  Signed, Cecilie Kicks, NP  06/28/2016 10:31 PM    [address], [place], [place], [place] Phone: [phone]; Fax: [phone]

## 2016-07-10 NOTE — Interval H&P Note (Signed)
History and Physical Interval Note:  07/10/2016 8:36 AM  Beth Hubbard  has presented today for surgery, with the diagnosis of SEVERE MITRAL REGURGITATION  The various methods of treatment have been discussed with the patient and family. After consideration of risks, benefits and other options for treatment, the patient has consented to  Procedure(s): TRANSESOPHAGEAL ECHOCARDIOGRAM (TEE) (N/A) as a surgical intervention .  The patient's history has been reviewed, patient examined, no change in status, stable for surgery.  I have reviewed the patient's chart and labs.  Questions were answered to the patient's satisfaction.     Fransico Him

## 2016-07-10 NOTE — Discharge Instructions (Signed)

## 2016-07-10 NOTE — CV Procedure (Signed)
    PROCEDURE NOTE:  Procedure:  Transesophageal echocardiogram Operator:  Fransico Him, MD Indications:  Mitral regurgitation Complications: None  During this procedure the patient is administered a total of Versed 5 mg and Fentanyl 62.5 mg to achieve and maintain moderate conscious sedation.  The patient's heart rate, blood pressure, and oxygen saturation are monitored continuously during the procedure. The period of conscious sedation is 15 minutes, of which I was present face-to-face 100% of this time.  Multiple attempts were made at placing the probe but could not advance due to patient gagging despite good sedation.  Procedure terminated.  The patient was transferred back to their room in stable condition.  Will reschedule TEE with MAC in near future.  Signed: Fransico Him, MD [facility]

## 2016-07-10 NOTE — Telephone Encounter (Signed)
Patient presented for TEE and unable to pass TEE probe.  Please set up patient for repeat TEE with MAC anesthesia

## 2016-07-13 ENCOUNTER — Other Ambulatory Visit: Payer: Self-pay | Admitting: Cardiology

## 2016-07-13 DIAGNOSIS — I340 Nonrheumatic mitral (valve) insufficiency: Secondary | ICD-10-CM

## 2016-07-13 NOTE — Telephone Encounter (Signed)
Rescheduled TEE with MAC on July 20, 2016 with Dr. Debara Pickett (patient requested Friday only).  Reviewed instructions with patient. She understands not to eat after midnight, to hold her Januvia the morning of the procedure and to be at the [facility] at 0730 for 0900 procedure.  She was grateful for assistance.  Confirmation [id number], Central Scheduler Delilah Shan

## 2016-07-20 ENCOUNTER — Ambulatory Visit (HOSPITAL_COMMUNITY): Payer: Medicare Other | Admitting: Certified Registered Nurse Anesthetist

## 2016-07-20 ENCOUNTER — Ambulatory Visit (HOSPITAL_BASED_OUTPATIENT_CLINIC_OR_DEPARTMENT_OTHER): Payer: Medicare Other

## 2016-07-20 ENCOUNTER — Encounter (HOSPITAL_COMMUNITY): Payer: Self-pay | Admitting: Certified Registered Nurse Anesthetist

## 2016-07-20 ENCOUNTER — Ambulatory Visit (HOSPITAL_COMMUNITY)
Admission: RE | Admit: 2016-07-20 | Discharge: 2016-07-20 | Disposition: A | Discharge status: Home / Self Care | Payer: Medicare Other | Source: Ambulatory Visit | Attending: Internal Medicine | Admitting: Internal Medicine

## 2016-07-20 ENCOUNTER — Encounter (HOSPITAL_COMMUNITY)
Admission: RE | Disposition: A | Discharge status: Home / Self Care | Payer: Self-pay | Source: Ambulatory Visit | Attending: Internal Medicine

## 2016-07-20 DIAGNOSIS — Z79899 Other long term (current) drug therapy: Secondary | ICD-10-CM | POA: Insufficient documentation

## 2016-07-20 DIAGNOSIS — Z7984 Long term (current) use of oral hypoglycemic drugs: Secondary | ICD-10-CM | POA: Insufficient documentation

## 2016-07-20 DIAGNOSIS — I10 Essential (primary) hypertension: Secondary | ICD-10-CM | POA: Diagnosis not present

## 2016-07-20 DIAGNOSIS — I340 Nonrheumatic mitral (valve) insufficiency: Secondary | ICD-10-CM | POA: Insufficient documentation

## 2016-07-20 DIAGNOSIS — G4730 Sleep apnea, unspecified: Secondary | ICD-10-CM | POA: Insufficient documentation

## 2016-07-20 DIAGNOSIS — E119 Type 2 diabetes mellitus without complications: Secondary | ICD-10-CM | POA: Diagnosis not present

## 2016-07-20 HISTORY — PX: TEE WITHOUT CARDIOVERSION: SHX5443

## 2016-07-20 LAB — ECHO TEE
Ao-asc: 26 cm
MRPISAEROA: 0.18 cm2
MV VTI: 196 cm

## 2016-07-20 LAB — GLUCOSE, CAPILLARY: GLUCOSE-CAPILLARY: 105 mg/dL — AB (ref 65–99)

## 2016-07-20 SURGERY — ECHOCARDIOGRAM, TRANSESOPHAGEAL
Anesthesia: Monitor Anesthesia Care

## 2016-07-20 MED ORDER — PROPOFOL 10 MG/ML IV BOLUS
INTRAVENOUS | Status: DC | PRN
  Administered 2016-07-20 (×5): 20 mg via INTRAVENOUS

## 2016-07-20 MED ORDER — SODIUM CHLORIDE 0.9 % IV SOLN: INTRAVENOUS | Status: DC

## 2016-07-20 MED ORDER — LACTATED RINGERS IV SOLN
INTRAVENOUS | Status: DC | PRN
  Administered 2016-07-20: 09:00:00 via INTRAVENOUS

## 2016-07-20 MED ORDER — PROPOFOL 500 MG/50ML IV EMUL
INTRAVENOUS | Status: DC | PRN
  Administered 2016-07-20: 50 ug/kg/min via INTRAVENOUS

## 2016-07-20 MED ORDER — LIDOCAINE HCL (CARDIAC) 20 MG/ML IV SOLN
INTRAVENOUS | Status: DC | PRN
  Administered 2016-07-20: 50 mg via INTRATRACHEAL

## 2016-07-20 NOTE — CV Procedure (Signed)
TRANSESOPHAGEAL ECHOCARDIOGRAM (TEE) NOTE  INDICATIONS: Mitral regurgitation  PROCEDURE:   Informed consent was obtained prior to the procedure. The risks, benefits and alternatives for the procedure were discussed and the patient comprehended these risks.  Risks include, but are not limited to, cough, sore throat, vomiting, nausea, somnolence, esophageal and stomach trauma or perforation, bleeding, low blood pressure, aspiration, pneumonia, infection, trauma to the teeth and death.    After a procedural time-out, the patient was given propofol per anesthesia for sedation.  The patient's heart rate, blood pressure, and oxygen saturation are monitored continuously during the procedure.The oropharynx was anesthetized with cetacaine spray.  The transesophageal probe was inserted in the esophagus and stomach without difficulty and multiple views were obtained.  The patient was kept under observation until the patient left the procedure room. The patient left the procedure room in stable condition.   Agitated microbubble saline contrast was administered.  COMPLICATIONS:    There were no immediate complications.  Findings:  1. LEFT VENTRICLE: The left ventricular wall thickness is mildly increased.  The left ventricular cavity is normal in size. Wall motion is normal.  LVEF is 65%.  2. RIGHT VENTRICLE:  The right ventricle is normal in structure and function without any thrombus or masses.    3. LEFT ATRIUM:  The left atrium is moderately dilated in size without any thrombus or masses.  There is not spontaneous echo contrast ("smoke") in the left atrium consistent with a low flow state.  4. LEFT ATRIAL APPENDAGE:  The left atrial appendage is free of any thrombus or masses. The appendage has single lobes. Pulse doppler indicates moderate flow in the appendage.  5. ATRIAL SEPTUM:  The atrial septum appears intact and is free of thrombus and/or masses.  There is no evidence for interatrial  shunting by color doppler and saline microbubble.  6. RIGHT ATRIUM:  The right atrium is normal in size and function without any thrombus or masses.  7. MITRAL VALVE:  The mitral valve demonstrates tethering of the posterior leaflet with Moderate, eccentric regurgitation and 2 distinct regurgitant jets. Mitral inflow is A wave dominant. Measurements by PISA are as follows: RVol 35 cc, EROA 0.18 cm2, MR radius 0.8 cm. The mitral annulus was not significantly dilated.  Systolic flow reversal was not noted in the pulmonary vein. There were no vegetations or stenosis.  8. AORTIC VALVE:  The aortic valve is trileaflet, normal in structure and function with no regurgitation.  There were no vegetations or stenosis  9. TRICUSPID VALVE:  The tricuspid valve is normal in structure and function with Mild regurgitation.  There were no vegetations or stenosis  10.  PULMONIC VALVE:  The pulmonic valve is normal in structure and function with trivial regurgitation.  There were no vegetations or stenosis.   11. AORTIC ARCH, ASCENDING AND DESCENDING AORTA:  There was no Ron Parker et. Al, 1992) atherosclerosis of the ascending aorta, aortic arch, or proximal descending aorta.  12. PULMONARY VEINS: Anomalous pulmonary venous return was not noted. Reversal of flow in the pulmonary vein was not noted.  13. PERICARDIUM: The pericardium appeared normal and non-thickened.  There is no pericardial effusion.  IMPRESSION:   1. Moderate eccentric mitral regurgitation, due to partial tethering of the posterior leaflet 2. No LAA thrombus 3. Negative for PFO 4. Moderate LAE 5. LVEF 65%  RECOMMENDATIONS:    1.  No indication for surgical repair of the valve at this time. Would continue TTE surveillance of the valve  and track patient symptoms.  Time Spent Directly with the Patient:  45 minutes   Pixie Casino, MD, [facility] Attending Cardiologist [facility]  07/20/2016, 9:37 AM

## 2016-07-20 NOTE — Anesthesia Postprocedure Evaluation (Signed)
Anesthesia Post Note  Patient: Beth Hubbard  Procedure(s) Performed: Procedure(s) (LRB): TRANSESOPHAGEAL ECHOCARDIOGRAM (TEE) (N/A)  Patient location during evaluation: PACU Anesthesia Type: MAC Level of consciousness: awake and alert Pain management: pain level controlled Vital Signs Assessment: post-procedure vital signs reviewed and stable Respiratory status: spontaneous breathing, nonlabored ventilation and respiratory function stable Cardiovascular status: stable and blood pressure returned to baseline Anesthetic complications: no    Last Vitals:  Vitals:   07/20/16 0950 07/20/16 1000  BP: (!) 129/91 (!) 153/69  Pulse: 89 85  Resp: 13 11  Temp:      Last Pain:  Vitals:   07/20/16 0816  TempSrc: Oral                 Barney Gertsch A

## 2016-07-20 NOTE — H&P (Signed)
     INTERVAL PROCEDURE H&P  History and Physical Interval Note:  07/20/2016 8:02 AM  Beth Hubbard has presented today for their planned procedure. The various methods of treatment have been discussed with the patient and family. After consideration of risks, benefits and other options for treatment, the patient has consented to the procedure.  The patients' outpatient history has been reviewed, patient examined, and no change in status from most recent office note within the past 30 days. I have reviewed the patients' chart and labs and will proceed as planned. Questions were answered to the patient's satisfaction.   Pixie Casino, MD, [facility] Attending Cardiologist [place] 07/20/2016, 8:02 AM

## 2016-07-20 NOTE — Anesthesia Preprocedure Evaluation (Signed)
Anesthesia Evaluation  Patient identified by MRN, date of birth, ID band Patient awake    Reviewed: Allergy & Precautions, NPO status , Patient's Chart, lab work & pertinent test results  Airway Mallampati: I  TM Distance: >3 FB Neck ROM: Full    Dental  (+) Poor Dentition, Missing, Dental Advisory Given   Pulmonary sleep apnea (No CPAP as machine is not properly adjusted) ,    breath sounds clear to auscultation       Cardiovascular hypertension, Pt. on medications + Valvular Problems/Murmurs MR  Rhythm:Regular Rate:Normal     Neuro/Psych    GI/Hepatic   Endo/Other  diabetes, Well Controlled, Type 2, Oral Hypoglycemic Agents  Renal/GU      Musculoskeletal   Abdominal   Peds  Hematology   Anesthesia Other Findings   Reproductive/Obstetrics                             Anesthesia Physical Anesthesia Plan  ASA: III  Anesthesia Plan: MAC   Post-op Pain Management:    Induction: Intravenous  Airway Management Planned: Simple Face Mask  Additional Equipment:   Intra-op Plan:   Post-operative Plan:   Informed Consent: I have reviewed the patients History and Physical, chart, labs and discussed the procedure including the risks, benefits and alternatives for the proposed anesthesia with the patient or authorized representative who has indicated his/her understanding and acceptance.   Dental advisory given  Plan Discussed with: CRNA, Anesthesiologist and Surgeon  Anesthesia Plan Comments:         Anesthesia Quick Evaluation

## 2016-07-20 NOTE — Discharge Instructions (Signed)

## 2016-07-20 NOTE — Progress Notes (Signed)
Echocardiogram Echocardiogram Transesophageal has been performed.  Beth Hubbard 07/20/2016, 9:51 AM

## 2016-07-20 NOTE — Transfer of Care (Signed)
Immediate Anesthesia Transfer of Care Note  Patient: Beth Hubbard  Procedure(s) Performed: Procedure(s): TRANSESOPHAGEAL ECHOCARDIOGRAM (TEE) (N/A)  Patient Location: PACU and Endoscopy Unit  Anesthesia Type:MAC  Level of Consciousness: awake, alert  and patient cooperative  Airway & Oxygen Therapy: Patient Spontanous Breathing and Patient connected to nasal cannula oxygen  Post-op Assessment: Report given to RN and Post -op Vital signs reviewed and stable  Post vital signs: Reviewed and stable  Last Vitals:  Vitals:   07/20/16 0816 07/20/16 0938  BP: (!) 155/64 (!) 149/66  Pulse: 93 89  Resp: 20 14  Temp: 36.9 C     Last Pain:  Vitals:   07/20/16 0816  TempSrc: Oral         Complications: No apparent anesthesia complications

## 2016-07-20 NOTE — Anesthesia Procedure Notes (Signed)
Date/Time: 07/20/2016 9:12 AM Performed by: Oletta Lamas Pre-anesthesia Checklist: Patient identified, Emergency Drugs available, Suction available and Patient being monitored Patient Re-evaluated:Patient Re-evaluated prior to inductionOxygen Delivery Method: Nasal cannula Placement Confirmation: positive ETCO2

## 2016-07-22 ENCOUNTER — Encounter (HOSPITAL_COMMUNITY): Payer: Self-pay | Admitting: Internal Medicine

## 2016-08-07 ENCOUNTER — Encounter: Payer: Self-pay | Admitting: Family Medicine

## 2016-08-07 ENCOUNTER — Ambulatory Visit (INDEPENDENT_AMBULATORY_CARE_PROVIDER_SITE_OTHER): Payer: Medicare Other | Admitting: Family Medicine

## 2016-08-07 VITALS — BP 129/60 | HR 88 | Temp 98.6°F | Resp 16 | Ht 63.0 in | Wt 157.0 lb

## 2016-08-07 DIAGNOSIS — R21 Rash and other nonspecific skin eruption: Secondary | ICD-10-CM | POA: Diagnosis not present

## 2016-08-07 MED ORDER — TRIAMCINOLONE ACETONIDE 0.1 % EX CREA: 1.0000 "application " | TOPICAL_CREAM | Freq: Two times a day (BID) | CUTANEOUS | 0 refills | Status: DC

## 2016-08-07 NOTE — Progress Notes (Signed)
Beth Hubbard, is a 70 y.o. female  WEX:[id number]  LFY:[id number]  DOB - 1946/08/30  CC:  Chief Complaint  Patient presents with  . Rash    itching rash on left leg        HPI: Beth Hubbard is a 70 y.o. female here with a complaint of an itchy rash on her left lower leg for about a week.  She has not tried any OTC cortisone cream.  She has an upcoming follow-up appointment for chronic conditions in about 6 weeks, so today will address only the rash. She is unaware of any precipitating event.  Allergies  Allergen Reactions  . Aspirin     Tremor   . Penicillins Itching and Other (See Comments)    Reaction: itching,headache, dizziness and anxiety. Feels like she is "goin off"  . Nsaids Anxiety    Reaction: causes dizziness and headache. Ibuprofen. Aspirin   Past Medical History:  Diagnosis Date  . Diabetes mellitus without complication ([place])   . Excessive daytime sleepiness   . High cholesterol   . Hypertension   . Mitral regurgitation    moderate by echo 2015  . OSA (obstructive sleep apnea) 10/18/2015   Mild OSA with AHI 11/hr  . Shingles   . TIA (transient ischemic attack)    Current Outpatient Prescriptions on File Prior to Visit  Medication Sig Dispense Refill  . ACCU-CHEK AVIVA PLUS test strip Reported on 02/24/2016 100 each 1  . ACCU-CHEK SOFTCLIX LANCETS lancets Check BS once or twice a day. 100 each 1  . alendronate (FOSAMAX) 70 MG tablet Take 1 tablet (70 mg total) by mouth once a week. Take with a full glass of water on an empty stomach. 12 tablet 3  . amLODipine (NORVASC) 5 MG tablet Take 1 tablet (5 mg total) by mouth daily. 30 tablet 3  . Blood Glucose Monitoring Suppl (ACCU-CHEK AVIVA PLUS) w/Device KIT 1 kit by Does not apply route as directed. Check 1 to 2 times daily 1 kit 0  . Calcium Carbonate-Vitamin D (CALCIUM 600/VITAMIN D) 600-400 MG-UNIT chew tablet Chew 1 tablet by mouth daily.    . COMBIGAN 0.2-0.5 % ophthalmic solution Place 1 drop into the left  eye 2 (two) times daily.    . cyclobenzaprine (FLEXERIL) 10 MG tablet Take 10 mg by mouth 3 (three) times daily as needed for muscle spasms.    . ferrous sulfate 325 (65 FE) MG tablet Take 1 tablet (325 mg total) by mouth daily with breakfast. 30 tablet 3  . folic acid (FOLVITE) 852 MCG tablet Take 400 mcg by mouth daily.    Marland Kitchen HYDROcodone-acetaminophen (NORCO/VICODIN) 5-325 MG tablet Take 1 tablet by mouth as directed.  0  . LUMIGAN 0.01 % SOLN Place 1 drop into both eyes at bedtime.  0  . rosuvastatin (CRESTOR) 20 MG tablet Take 1 tablet (20 mg total) by mouth daily. 30 tablet 3  . sitaGLIPtin (JANUVIA) 100 MG tablet Take 1 tablet (100 mg total) by mouth daily. 30 tablet 3  . sulindac (CLINORIL) 200 MG tablet Take 200 mg by mouth 2 (two) times daily.     No current facility-administered medications on file prior to visit.    Family History  Problem Relation Age of Onset  . Cancer Father   . Cancer Sister   . Cancer Brother    Social History   Social History  . Marital status: Legally Separated    Spouse name: N/A  . Number of children: N/A  .  Years of education: N/A   Occupational History  . Not on file.   Social History Main Topics  . Smoking status: Never Smoker  . Smokeless tobacco: Never Used  . Alcohol use No  . Drug use: No  . Sexual activity: Not on file   Other Topics Concern  . Not on file   Social History Narrative  . No narrative on file    Review of Systems: Constitutional: Negative Skin: Negative Respiratory: Negative Psychiatric/Behavioral: Negative    Objective:   Vitals:   08/07/16 0946  BP: 129/60  Pulse: 88  Resp: 16  Temp: 98.6 F (37 C)    Physical Exam: Constitutional: Patient appears well-developed and well-nourished. No distress. Skin: There is an excoriated papular rash on the left lower lateral calf. Due to the excoriation it is impossible to identify.  Lab Results  Component Value Date   WBC 7.2 06/28/2016   HGB 11.2 (L)  06/28/2016   HCT 33.6 (L) 06/28/2016   MCV 94.1 06/28/2016   PLT 268 06/28/2016   Lab Results  Component Value Date   CREATININE 0.88 06/28/2016   BUN 15 06/28/2016   NA 140 06/28/2016   K 4.3 06/28/2016   CL 101 06/28/2016   CO2 33 (H) 06/28/2016    Lab Results  Component Value Date   HGBA1C 6.2 (H) 03/21/2016   Lipid Panel     Component Value Date/Time   CHOL 202 (H) 03/05/2016 1027   TRIG 171 (H) 03/05/2016 1027   HDL 73 03/05/2016 1027   CHOLHDL 2.8 03/05/2016 1027   VLDL 34 (H) 03/05/2016 1027   LDLCALC 95 03/05/2016 1027       Assessment and plan:   Skin Rash.  Triamcinolone 1% cream, bid for two weeks.  -Keep next regular appointment.  Patient was given clear instructions to go to ER or return to medical center if symptoms don't improve, worsen or new problems develop. The patient verbalized understanding.    Micheline Chapman FNP  08/07/2016, 3:16 PM

## 2016-08-07 NOTE — Patient Instructions (Signed)
Use cream on rash twice a day. Follow-up if gets worse or does not improve

## 2016-08-21 ENCOUNTER — Encounter: Payer: Self-pay | Admitting: Cardiology

## 2016-08-22 ENCOUNTER — Encounter (INDEPENDENT_AMBULATORY_CARE_PROVIDER_SITE_OTHER): Payer: Self-pay

## 2016-08-22 ENCOUNTER — Ambulatory Visit (INDEPENDENT_AMBULATORY_CARE_PROVIDER_SITE_OTHER): Payer: Medicare Other | Admitting: Cardiology

## 2016-08-22 ENCOUNTER — Encounter: Payer: Self-pay | Admitting: Cardiology

## 2016-08-22 VITALS — BP 128/60 | HR 106 | Ht 63.0 in | Wt 160.8 lb

## 2016-08-22 DIAGNOSIS — I340 Nonrheumatic mitral (valve) insufficiency: Secondary | ICD-10-CM | POA: Diagnosis not present

## 2016-08-22 DIAGNOSIS — G4733 Obstructive sleep apnea (adult) (pediatric): Secondary | ICD-10-CM

## 2016-08-22 DIAGNOSIS — I10 Essential (primary) hypertension: Secondary | ICD-10-CM

## 2016-08-22 NOTE — Progress Notes (Addendum)
Cardiology Office Note    Date:  08/22/2016   ID:  Gracemarie, Skeet 10/27/1946, MRN [id number]  PCP:  Sharon Seller, NP  Cardiologist:  Fransico Him, MD   Chief Complaint  Patient presents with  . Sleep Apnea  . Mitral Regurgitation    History of Present Illness:  Beth Hubbard is a 70 y.o. female with a history of DM, dyslipidemia, no ischemia on nuclear stress test, moderate MR with normal LVF by echo, OSA and HTN who presents today for followup. She has mild OSA with an AHI of 11.9/hr with oxygen desaturations as low as 83% and is on CPAP at 7cm H2O.  She was not tolerating the CPAP pressure  of 7 and as last office visit we ordered for her to decrease it to 5cm H2O but she says that that was never done.  She was told that insurance would not pay for that machine and she needed a different machine and she handed it in last week.  She denies any anginal chest pain, SOB, DOE (execept with walking or talking too fast), LE edema, dizziness, palpitations or syncope.      Past Medical History:  Diagnosis Date  . Diabetes mellitus without complication ([place])   . Excessive daytime sleepiness   . High cholesterol   . Hypertension   . Mitral regurgitation    moderate by echo 2015  . OSA (obstructive sleep apnea) 10/18/2015   Mild OSA with AHI 11/hr  . Shingles   . TIA (transient ischemic attack)     Past Surgical History:  Procedure Laterality Date  . ABDOMINAL SURGERY    . ROTATOR CUFF REPAIR     bilateral  . TEE WITHOUT CARDIOVERSION N/A 07/20/2016   Procedure: TRANSESOPHAGEAL ECHOCARDIOGRAM (TEE);  Surgeon: Pixie Casino, MD;  Location: [facility] ENDOSCOPY;  Service: Cardiovascular;  Laterality: N/A;    Current Medications: Outpatient Medications Prior to Visit  Medication Sig Dispense Refill  . ACCU-CHEK AVIVA PLUS test strip Reported on 02/24/2016 100 each 1  . ACCU-CHEK SOFTCLIX LANCETS lancets Check BS once or twice a day. 100 each 1  . alendronate (FOSAMAX) 70 MG  tablet Take 1 tablet (70 mg total) by mouth once a week. Take with a full glass of water on an empty stomach. 12 tablet 3  . amLODipine (NORVASC) 5 MG tablet Take 1 tablet (5 mg total) by mouth daily. 30 tablet 3  . Blood Glucose Monitoring Suppl (ACCU-CHEK AVIVA PLUS) w/Device KIT 1 kit by Does not apply route as directed. Check 1 to 2 times daily 1 kit 0  . Calcium Carbonate-Vitamin D (CALCIUM 600/VITAMIN D) 600-400 MG-UNIT chew tablet Chew 1 tablet by mouth daily.    . clindamycin (CLEOCIN) 300 MG capsule Take 300 mg by mouth 3 (three) times daily.    . COMBIGAN 0.2-0.5 % ophthalmic solution Place 1 drop into the left eye 2 (two) times daily.    . cyclobenzaprine (FLEXERIL) 10 MG tablet Take 10 mg by mouth 3 (three) times daily as needed for muscle spasms.    . ferrous sulfate 325 (65 FE) MG tablet Take 1 tablet (325 mg total) by mouth daily with breakfast. 30 tablet 3  . folic acid (FOLVITE) 093 MCG tablet Take 400 mcg by mouth daily.    Marland Kitchen HYDROcodone-acetaminophen (NORCO/VICODIN) 5-325 MG tablet Take 1 tablet by mouth as directed.  0  . rosuvastatin (CRESTOR) 20 MG tablet Take 1 tablet (20 mg total) by mouth daily. 30 tablet  3  . sitaGLIPtin (JANUVIA) 100 MG tablet Take 1 tablet (100 mg total) by mouth daily. 30 tablet 3  . triamcinolone cream (KENALOG) 0.1 % Apply 1 application topically 2 (two) times daily. 30 g 0  . LUMIGAN 0.01 % SOLN Place 1 drop into both eyes at bedtime.  0  . sulindac (CLINORIL) 200 MG tablet Take 200 mg by mouth 2 (two) times daily.     No facility-administered medications prior to visit.      Allergies:   Aspirin; Penicillins; and Nsaids   Social History   Social History  . Marital status: Legally Separated    Spouse name: N/A  . Number of children: N/A  . Years of education: N/A   Social History Main Topics  . Smoking status: Never Smoker  . Smokeless tobacco: Never Used  . Alcohol use No  . Drug use: No  . Sexual activity: Not Asked   Other Topics  Concern  . None   Social History Narrative  . None     Family History:  The patient's family history includes Cancer in her brother, father, and sister.   ROS:   Please see the history of present illness.    ROS All other systems reviewed and are negative.  No flowsheet data found.     PHYSICAL EXAM:   VS:  BP 128/60   Pulse (!) 106   Ht 5' 3"  (1.6 m)   Wt 160 lb 12.8 oz (72.9 kg)   SpO2 98%   BMI 28.48 kg/m    GEN: Well nourished, well developed, in no acute distress  HEENT: normal  Neck: no JVD, carotid bruits, or masses Cardiac: RRR; no murmurs, rubs, or gallops,no edema.  Intact distal pulses bilaterally.  Respiratory:  clear to auscultation bilaterally, normal work of breathing GI: soft, nontender, nondistended, + BS MS: no deformity or atrophy  Skin: warm and dry, no rash Neuro:  Alert and Oriented x 3, Strength and sensation are intact Psych: euthymic mood, full affect  Wt Readings from Last 3 Encounters:  08/22/16 160 lb 12.8 oz (72.9 kg)  08/07/16 157 lb (71.2 kg)  07/20/16 158 lb (71.7 kg)      Studies/Labs Reviewed:   EKG:  EKG is not ordered today.    Recent Labs: 03/05/2016: ALT 12; TSH 1.27 06/28/2016: BUN 15; Creat 0.88; Hemoglobin 11.2; Platelets 268; Potassium 4.3; Sodium 140   Lipid Panel    Component Value Date/Time   CHOL 202 (H) 03/05/2016 1027   TRIG 171 (H) 03/05/2016 1027   HDL 73 03/05/2016 1027   CHOLHDL 2.8 03/05/2016 1027   VLDL 34 (H) 03/05/2016 1027   [place] 95 03/05/2016 1027    Additional studies/ records that were reviewed today include:  none    ASSESSMENT:    1. OSA (obstructive sleep apnea)   2. Essential hypertension   3. Mitral valve insufficiency, unspecified etiology      PLAN:  In order of problems listed above:  OSA - the patient was told to hand in her CPAP last week due to non compliance.  We will now have to start over.  I will get an in lab split night study since she had difficult time  tolerating CPAP. HTN - BP controlled on current meds.  Continue amlodipine. Moderate MR by TEE 07/2016 - she is asymptomatic.  Repeat echo in 1 year.      Medication Adjustments/Labs and Tests Ordered: Current medicines are reviewed at length with the patient  today.  Concerns regarding medicines are outlined above.  Medication changes, Labs and Tests ordered today are listed in the Patient Instructions below.  There are no Patient Instructions on file for this visit.   Signed, Fransico Him, MD  08/22/2016 1:28 PM    [facility] [address], [place], [place]  [address] Phone: [phone]; Fax: [phone]

## 2016-08-22 NOTE — Patient Instructions (Signed)
Medication Instructions:  Your physician recommends that you continue on your current medications as directed. Please refer to the Current Medication list given to you today.   Labwork: None  Testing/Procedures: Your physician has requested that you have an echocardiogram in 6 months. Echocardiography is a painless test that uses sound waves to create images of your heart. It provides your doctor with information about the size and shape of your heart and how well your heart's chambers and valves are working. This procedure takes approximately one hour. There are no restrictions for this procedure.  Follow-Up: Your physician wants you to follow-up in: 6 months with Dr. Turner. You will receive a reminder letter in the mail two months in advance. If you don't receive a letter, please call our office to schedule the follow-up appointment.   Any Other Special Instructions Will Be Listed Below (If Applicable).     If you need a refill on your cardiac medications before your next appointment, please call your pharmacy.   

## 2016-08-23 ENCOUNTER — Telehealth: Payer: Self-pay | Admitting: *Deleted

## 2016-08-23 DIAGNOSIS — G4733 Obstructive sleep apnea (adult) (pediatric): Secondary | ICD-10-CM

## 2016-08-23 NOTE — Telephone Encounter (Signed)
Per Corene Cornea with [facility]:  Pressure change (from 7 to 5) was never made because they never received an order for this.  It was in Dr. Theodosia Blender office visit note, however messages were never sent to [facility]AP [facility] or to [facility].   Patient had to return her machine because insurance saw that her download from 02/24/16 was non-compliant.    Due to the non-compliant issues:   1. OV Note from 08/22/16 needs to be updated to stated that patient was non-compliant and had to return machine, therefore another sleep study must be done.  2. A split night can be completed OR patient can do a home sleep study with [facility] and then an auto-titration with [facility].      I have routed this to Dr. Radford Pax and nurse to advise.

## 2016-08-23 NOTE — Addendum Note (Signed)
Addended by: Harland German A on: 08/23/2016 08:49 AM   Modules accepted: Orders

## 2016-08-26 NOTE — Telephone Encounter (Signed)
Please order an in lab split night study

## 2016-08-27 NOTE — Telephone Encounter (Signed)
Attempted to call patient - she answered stating that she was at the hospital and would call later.

## 2016-09-14 ENCOUNTER — Ambulatory Visit: Payer: Self-pay | Admitting: Family Medicine

## 2016-09-14 NOTE — Addendum Note (Signed)
Addended by: Katina Dung on: 09/14/2016 11:20 AM   Modules accepted: Orders

## 2016-09-14 NOTE — Telephone Encounter (Signed)
Spoke with patient and she is aware that she will have to go for another sleep study.   Once the sleep study is done, we can put in an order for Auto-Titration to [facility] and she can be titrated from home, but due to non-compliance, she must go through another sleep study.  She is aware that I will get the sleep lab to schedule her, and that she will receive a packet of information with the date of the study.

## 2016-10-18 ENCOUNTER — Ambulatory Visit (INDEPENDENT_AMBULATORY_CARE_PROVIDER_SITE_OTHER): Payer: Medicare Other | Admitting: Family Medicine

## 2016-10-18 ENCOUNTER — Encounter: Payer: Self-pay | Admitting: Family Medicine

## 2016-10-18 VITALS — BP 131/58 | HR 96 | Temp 99.1°F | Resp 16 | Ht 63.0 in | Wt 163.0 lb

## 2016-10-18 DIAGNOSIS — Z1211 Encounter for screening for malignant neoplasm of colon: Secondary | ICD-10-CM | POA: Diagnosis not present

## 2016-10-18 DIAGNOSIS — E118 Type 2 diabetes mellitus with unspecified complications: Secondary | ICD-10-CM | POA: Diagnosis not present

## 2016-10-18 DIAGNOSIS — E785 Hyperlipidemia, unspecified: Secondary | ICD-10-CM

## 2016-10-18 DIAGNOSIS — R21 Rash and other nonspecific skin eruption: Secondary | ICD-10-CM

## 2016-10-18 DIAGNOSIS — I10 Essential (primary) hypertension: Secondary | ICD-10-CM

## 2016-10-18 DIAGNOSIS — M810 Age-related osteoporosis without current pathological fracture: Secondary | ICD-10-CM

## 2016-10-18 DIAGNOSIS — D649 Anemia, unspecified: Secondary | ICD-10-CM

## 2016-10-18 LAB — COMPLETE METABOLIC PANEL WITH GFR
ALT: 20 U/L (ref 6–29)
AST: 22 U/L (ref 10–35)
Albumin: 4.2 g/dL (ref 3.6–5.1)
Alkaline Phosphatase: 30 U/L — ABNORMAL LOW (ref 33–130)
BILIRUBIN TOTAL: 0.3 mg/dL (ref 0.2–1.2)
BUN: 12 mg/dL (ref 7–25)
CO2: 29 mmol/L (ref 20–31)
CREATININE: 0.82 mg/dL (ref 0.60–0.93)
Calcium: 8.9 mg/dL (ref 8.6–10.4)
Chloride: 103 mmol/L (ref 98–110)
GFR, EST AFRICAN AMERICAN: 83 mL/min (ref >=60)
GFR, Est Non African American: 72 mL/min (ref >=60)
GLUCOSE: 99 mg/dL (ref 65–99)
Potassium: 4 mmol/L (ref 3.5–5.3)
Sodium: 141 mmol/L (ref 135–146)
TOTAL PROTEIN: 6.9 g/dL (ref 6.1–8.1)

## 2016-10-18 LAB — CBC WITH DIFFERENTIAL/PLATELET
BASOS ABS: 0 {cells}/uL (ref 0–200)
Basophils Relative: 0 %
EOS ABS: 116 {cells}/uL (ref 15–500)
Eosinophils Relative: 2 %
HCT: 35 % (ref 35.0–45.0)
Hemoglobin: 11.3 g/dL — ABNORMAL LOW (ref 11.7–15.5)
LYMPHS PCT: 33 %
Lymphs Abs: 1914 cells/uL (ref 850–3900)
MCH: 30.6 pg (ref 27.0–33.0)
MCHC: 32.3 g/dL (ref 32.0–36.0)
MCV: 94.9 fL (ref 80.0–100.0)
MONOS PCT: 8 %
MPV: 9.8 fL (ref 7.5–12.5)
Monocytes Absolute: 464 cells/uL (ref 200–950)
NEUTROS PCT: 57 %
Neutro Abs: 3306 cells/uL (ref 1500–7800)
PLATELETS: 259 10*3/uL (ref 140–400)
RBC: 3.69 MIL/uL — ABNORMAL LOW (ref 3.80–5.10)
RDW: 14.5 % (ref 11.0–15.0)
WBC: 5.8 10*3/uL (ref 3.8–10.8)

## 2016-10-18 LAB — LIPID PANEL
Cholesterol: 154 mg/dL (ref <200)
HDL: 65 mg/dL (ref >50)
LDL CALC: 61 mg/dL (ref <100)
Total CHOL/HDL Ratio: 2.4 Ratio (ref <5.0)
Triglycerides: 141 mg/dL (ref <150)
VLDL: 28 mg/dL (ref <30)

## 2016-10-18 LAB — POCT GLYCOSYLATED HEMOGLOBIN (HGB A1C): HEMOGLOBIN A1C: 6.1

## 2016-10-18 MED ORDER — ROSUVASTATIN CALCIUM 20 MG PO TABS: 20.0000 mg | ORAL_TABLET | Freq: Every day | ORAL | 3 refills | Status: DC

## 2016-10-18 MED ORDER — TRIAMCINOLONE ACETONIDE 0.1 % EX CREA: 1.0000 "application " | TOPICAL_CREAM | Freq: Two times a day (BID) | CUTANEOUS | 0 refills | Status: DC

## 2016-10-18 MED ORDER — SITAGLIPTIN PHOSPHATE 100 MG PO TABS: 100.0000 mg | ORAL_TABLET | Freq: Every day | ORAL | 3 refills | Status: DC

## 2016-10-18 MED ORDER — AMLODIPINE BESYLATE 5 MG PO TABS: 5.0000 mg | ORAL_TABLET | Freq: Every day | ORAL | 3 refills | Status: DC

## 2016-10-18 MED ORDER — ALENDRONATE SODIUM 70 MG PO TABS: 70.0000 mg | ORAL_TABLET | ORAL | 3 refills | Status: DC

## 2016-10-18 MED ORDER — FERROUS SULFATE 325 (65 FE) MG PO TABS: 325.0000 mg | ORAL_TABLET | Freq: Every day | ORAL | 3 refills | Status: DC

## 2016-10-18 NOTE — Progress Notes (Signed)
Beth Hubbard, is a 70 y.o. female  DJS:[id number]  YIF:[id number]  DOB - [birth year]  CC:  Chief Complaint  Patient presents with  . Hypertension  . Diabetes  . Numbness    in hands and arms when sleeping        HPI: Beth Hubbard is a 70 y.o. female here for follow-up diabetes and hypertension. She reports taking her medication as prescribed and trying to follow a low carb diet. She does not exercise regularly. She does not smoke, drink alcohol or use illicit drugs. Her A1C today is 6.1 and her BP is 131/58. She does complain of right neck and upper back pain for a couple of weeks. She has chronic pain and is followed by pain clinic. She request a refill of triamcinolone for intermittent itching on her legs.  Health Maintenance: She received flu shot at [facility]. Her PAP and mammogram are current. She needs a colonoscopy.    Allergies  Allergen Reactions  . Aspirin     Tremor   . Penicillins Itching and Other (See Comments)    Reaction: itching,headache, dizziness and anxiety. Feels like she is "goin off"  . Nsaids Anxiety    Reaction: causes dizziness and headache. Ibuprofen. Aspirin   Past Medical History:  Diagnosis Date  . Diabetes mellitus without complication ([place])   . Excessive daytime sleepiness   . High cholesterol   . Hypertension   . Mitral regurgitation    moderate by echo 2015  . OSA (obstructive sleep apnea) 10/18/2015   Mild OSA with AHI 11/hr  . Shingles   . TIA (transient ischemic attack)    Current Outpatient Prescriptions on File Prior to Visit  Medication Sig Dispense Refill  . ACCU-CHEK AVIVA PLUS test strip Reported on 02/24/2016 100 each 1  . ACCU-CHEK SOFTCLIX LANCETS lancets Check BS once or twice a day. 100 each 1  . Blood Glucose Monitoring Suppl (ACCU-CHEK AVIVA PLUS) w/Device KIT 1 kit by Does not apply route as directed. Check 1 to 2 times daily 1 kit 0  . Calcium Carbonate-Vitamin D (CALCIUM 600/VITAMIN D) 600-400 MG-UNIT chew tablet Chew  1 tablet by mouth daily.    . COMBIGAN 0.2-0.5 % ophthalmic solution Place 1 drop into the left eye 2 (two) times daily.    . cyclobenzaprine (FLEXERIL) 10 MG tablet Take 10 mg by mouth 3 (three) times daily as needed for muscle spasms.    . folic acid (FOLVITE) 867 MCG tablet Take 400 mcg by mouth daily.    Marland Kitchen HYDROcodone-acetaminophen (NORCO/VICODIN) 5-325 MG tablet Take 1 tablet by mouth as directed.  0   No current facility-administered medications on file prior to visit.    Family History  Problem Relation Age of Onset  . Cancer Father   . Cancer Sister   . Cancer Brother    Social History   Social History  . Marital status: Legally Separated    Spouse name: N/A  . Number of children: N/A  . Years of education: N/A   Occupational History  . Not on file.   Social History Main Topics  . Smoking status: Never Smoker  . Smokeless tobacco: Never Used  . Alcohol use No  . Drug use: No  . Sexual activity: Not on file   Other Topics Concern  . Not on file   Social History Narrative  . No narrative on file    Review of Systems: Constitutional: + for fatigue Skin: + intermittent itching of legs HENT:  Negative  Eyes: Negative  Neck: Negative Respiratory: Negative Cardiovascular: Negative Gastrointestinal: Negative Genitourinary: Negative  Musculoskeletal: + for neck and back pain Neurological: Negative for Hematological: Negative  Psychiatric/Behavioral: Negative    Objective:   Vitals:   10/18/16 0817  BP: (!) 131/58  Pulse: 96  Resp: 16  Temp: 99.1 F (37.3 C)    Physical Exam: Constitutional: Patient appears well-developed and well-nourished. No distress. HENT: Normocephalic, atraumatic, External right and left ear normal. Oropharynx is clear and moist.  Eyes: Conjunctivae and EOM are normal. PERRLA, no scleral icterus. Neck: Normal ROM. Neck supple. No lymphadenopathy, No thyromegaly. CVS: RRR, S1/S2 +, no murmurs, no gallops, no rubs Pulmonary:  Effort and breath sounds normal, no stridor, rhonchi, wheezes, rales.  Abdominal: Soft. Normoactive BS,, no distension, tenderness, rebound or guarding.  Musculoskeletal: Normal range of motion. No edema and no tenderness.  Neuro: Alert.Normal muscle tone coordination. Non-focal Skin: Skin is warm and dry. No rash noted. Not diaphoretic. No erythema. No pallor. Psychiatric: Normal mood and affect. Behavior, judgment, thought content normal.  Lab Results  Component Value Date   WBC 7.2 06/28/2016   HGB 11.2 (L) 06/28/2016   HCT 33.6 (L) 06/28/2016   MCV 94.1 06/28/2016   PLT 268 06/28/2016   Lab Results  Component Value Date   CREATININE 0.88 06/28/2016   BUN 15 06/28/2016   NA 140 06/28/2016   K 4.3 06/28/2016   CL 101 06/28/2016   CO2 33 (H) 06/28/2016    Lab Results  Component Value Date   HGBA1C 6.1 10/18/2016   Lipid Panel     Component Value Date/Time   CHOL 202 (H) 03/05/2016 1027   TRIG 171 (H) 03/05/2016 1027   HDL 73 03/05/2016 1027   CHOLHDL 2.8 03/05/2016 1027   VLDL 34 (H) 03/05/2016 1027   LDLCALC 95 03/05/2016 1027        Assessment and plan:   1. Type 2 diabetes mellitus with complication, without long-term current use of insulin (HCC)  - HgB A1c - COMPLETE METABOLIC PANEL WITH GFR - CBC with Differential - Lipid panel  2. Essential hypertension  - amLODipine (NORVASC) 5 MG tablet; Take 1 tablet (5 mg total) by mouth daily.  Dispense: 30 tablet; Refill: 3  3. Rash and nonspecific skin eruption  - triamcinolone cream (KENALOG) 0.1 %; Apply 1 application topically 2 (two) times daily.  Dispense: 30 g; Refill: 0  4. Special screening for malignant neoplasms, colon  - Ambulatory referral to Gastroenterology  5. Osteoporosis, unspecified osteoporosis type, unspecified pathological fracture presence  - alendronate (FOSAMAX) 70 MG tablet; Take 1 tablet (70 mg total) by mouth once a week. Take with a full glass of water on an empty stomach.   Dispense: 12 tablet; Refill: 3  6. Anemia, unspecified type  - ferrous sulfate 325 (65 FE) MG tablet; Take 1 tablet (325 mg total) by mouth daily with breakfast.  Dispense: 30 tablet; Refill: 3  7. Hyperlipidemia, unspecified hyperlipidemia type  - rosuvastatin (CRESTOR) 20 MG tablet; Take 1 tablet (20 mg total) by mouth daily.  Dispense: 30 tablet; Refill: 3  8. Neck and upper back pain -Voltarten gel, app. Qid prn.  Return in about 6 months (around 04/17/2017) for HTN, Diabetes, A1C.  The patient was given clear instructions to go to ER or return to medical center if symptoms don't improve, worsen or new problems develop. The patient verbalized understanding.    Micheline Chapman FNP  10/18/2016, 9:00 AM

## 2016-10-25 ENCOUNTER — Encounter (HOSPITAL_BASED_OUTPATIENT_CLINIC_OR_DEPARTMENT_OTHER): Payer: Self-pay

## 2016-12-14 ENCOUNTER — Ambulatory Visit (HOSPITAL_BASED_OUTPATIENT_CLINIC_OR_DEPARTMENT_OTHER): Payer: Medicare Other | Attending: Cardiology | Admitting: Cardiology

## 2016-12-14 VITALS — Ht 62.0 in | Wt 150.0 lb

## 2016-12-14 DIAGNOSIS — G4733 Obstructive sleep apnea (adult) (pediatric): Secondary | ICD-10-CM | POA: Insufficient documentation

## 2016-12-16 NOTE — Procedures (Signed)
   Patient Name: Beth Hubbard, Beth Hubbard Date: 12/14/2016 Gender: Female D.O.B: March 15, 1946 Age (years): 47 Referring Provider: Fransico Him MD, ABSM Height (inches): 62 Interpreting Physician: Fransico Him MD, ABSM Weight (lbs): 150 RPSGT: Baxter Flattery BMI: 27 MRN: [id number] Neck Size: 14.00  CLINICAL INFORMATION Sleep Study Type: NPSG  Indication for sleep study: Diabetes, Fatigue, Hypertension, Snoring  Epworth Sleepiness Score:4  Most recent polysomnogram dated 10/17/2015 revealed an AHI of 11.9/h and RDI of 14.2/h. Most recent titration study dated 11/28/2015 was optimal at 8cm H2O with an AHI of 0.6/h.  SLEEP STUDY TECHNIQUE As per the AASM Manual for the Scoring of Sleep and Associated Events v2.3 (April 2016) with a hypopnea requiring 4% desaturations.  The channels recorded and monitored were frontal, central and occipital EEG, electrooculogram (EOG), submentalis EMG (chin), nasal and oral airflow, thoracic and abdominal wall motion, anterior tibialis EMG, snore microphone, electrocardiogram, and pulse oximetry.  MEDICATIONS Medications self-administered by patient taken the night of the study : NEURONTIN, [place], CLINORIL, [place] The study was initiated at 10:09:08 PM and ended at 4:36:21 AM.  Sleep onset time was 43.1 minutes and the sleep efficiency was 62.0%. The total sleep time was 240.0 minutes.  Stage REM latency was 171.0 minutes.  The patient spent 12.50% of the night in stage N1 sleep, 81.67% in stage N2 sleep, 0.00% in stage N3 and 5.83% in REM.  Alpha intrusion was absent.  Supine sleep was 1.67%.  RESPIRATORY PARAMETERS The overall apnea/hypopnea index (AHI) was 12.5 per hour. There were 24 total apneas, including 24 obstructive, 0 central and 0 mixed apneas. There were 26 hypopneas and 0 RERAs.  The AHI during Stage REM sleep was 64.3 per hour.  AHI while supine was 0.0 per hour.  The mean oxygen saturation was 94.19%. The  minimum SpO2 during sleep was 81.00%.  Loud snoring was noted during this study.  CARDIAC DATA The 2 lead EKG demonstrated sinus rhythm. The mean heart rate was 87.92 beats per minute. Other EKG findings include: None.  LEG MOVEMENT DATA The total PLMS were 8 with a resulting PLMS index of 2.00. Associated arousal with leg movement index was 0.0 .  IMPRESSIONS - Mild obstructive sleep apnea occurred during this study (AHI = 12.5/h). - No significant central sleep apnea occurred during this study (CAI = 0.0/h). - Mild oxygen desaturation was noted during this study (Min O2 = 81.00%). - The patient snored with Loud snoring volume. - No cardiac abnormalities were noted during this study. - Clinically significant periodic limb movements did not occur during sleep. No significant associated arousals.  DIAGNOSIS - Obstructive Sleep Apnea (327.23 [G47.33 ICD-10])  RECOMMENDATIONS - Therapeutic CPAP titration to determine optimal pressure required to alleviate sleep disordered breathing. - Avoid alcohol, sedatives and other CNS depressants that may worsen sleep apnea and disrupt normal sleep architecture. - Sleep hygiene should be reviewed to assess factors that may improve sleep quality. - Weight management and regular exercise should be initiated or continued if appropriate.  [place], American Board of Sleep Medicine  ELECTRONICALLY SIGNED ON:  12/16/2016, 2:22 PM [place] PH: [phone]   FX: [phone] [place]

## 2016-12-19 ENCOUNTER — Telehealth: Payer: Self-pay | Admitting: *Deleted

## 2016-12-19 DIAGNOSIS — G4733 Obstructive sleep apnea (adult) (pediatric): Secondary | ICD-10-CM

## 2016-12-19 NOTE — Telephone Encounter (Signed)
-----   Message from Sueanne Margarita, MD sent at 12/16/2016  2:24 PM EST ----- Please let patient know that they have sleep apnea and recommend CPAP titration. Please set up titration in the sleep lab.

## 2016-12-19 NOTE — Telephone Encounter (Signed)
Called the patient with the results and recommendations, she verbalized understanding

## 2016-12-20 ENCOUNTER — Encounter: Payer: Self-pay | Admitting: *Deleted

## 2016-12-20 ENCOUNTER — Encounter: Payer: Self-pay | Admitting: Family Medicine

## 2016-12-20 ENCOUNTER — Ambulatory Visit (INDEPENDENT_AMBULATORY_CARE_PROVIDER_SITE_OTHER): Payer: Medicare Other | Admitting: Family Medicine

## 2016-12-20 VITALS — BP 124/60 | HR 93 | Temp 98.7°F | Resp 16 | Ht 63.0 in | Wt 165.0 lb

## 2016-12-20 DIAGNOSIS — E119 Type 2 diabetes mellitus without complications: Secondary | ICD-10-CM | POA: Diagnosis not present

## 2016-12-20 DIAGNOSIS — G4700 Insomnia, unspecified: Secondary | ICD-10-CM | POA: Diagnosis not present

## 2016-12-20 DIAGNOSIS — Z23 Encounter for immunization: Secondary | ICD-10-CM | POA: Diagnosis not present

## 2016-12-20 DIAGNOSIS — I10 Essential (primary) hypertension: Secondary | ICD-10-CM | POA: Diagnosis not present

## 2016-12-20 LAB — CBC WITH DIFFERENTIAL/PLATELET
BASOS ABS: 0 {cells}/uL (ref 0–200)
Basophils Relative: 0 %
EOS PCT: 1 %
Eosinophils Absolute: 69 cells/uL (ref 15–500)
HEMATOCRIT: 35.8 % (ref 35.0–45.0)
Hemoglobin: 11.5 g/dL — ABNORMAL LOW (ref 11.7–15.5)
LYMPHS PCT: 33 %
Lymphs Abs: 2277 cells/uL (ref 850–3900)
MCH: 30.7 pg (ref 27.0–33.0)
MCHC: 32.1 g/dL (ref 32.0–36.0)
MCV: 95.5 fL (ref 80.0–100.0)
MONO ABS: 552 {cells}/uL (ref 200–950)
MPV: 9.7 fL (ref 7.5–12.5)
Monocytes Relative: 8 %
NEUTROS PCT: 58 %
Neutro Abs: 4002 cells/uL (ref 1500–7800)
Platelets: 284 10*3/uL (ref 140–400)
RBC: 3.75 MIL/uL — AB (ref 3.80–5.10)
RDW: 15.2 % — AB (ref 11.0–15.0)
WBC: 6.9 10*3/uL (ref 3.8–10.8)

## 2016-12-20 LAB — COMPLETE METABOLIC PANEL WITH GFR
ALBUMIN: 4.5 g/dL (ref 3.6–5.1)
ALK PHOS: 38 U/L (ref 33–130)
ALT: 16 U/L (ref 6–29)
AST: 18 U/L (ref 10–35)
BUN: 11 mg/dL (ref 7–25)
CALCIUM: 9.3 mg/dL (ref 8.6–10.4)
CO2: 31 mmol/L (ref 20–31)
CREATININE: 0.99 mg/dL — AB (ref 0.60–0.93)
Chloride: 100 mmol/L (ref 98–110)
GFR, Est African American: 66 mL/min (ref >=60)
GFR, Est Non African American: 58 mL/min — ABNORMAL LOW (ref >=60)
Glucose, Bld: 86 mg/dL (ref 65–99)
Potassium: 4 mmol/L (ref 3.5–5.3)
Sodium: 141 mmol/L (ref 135–146)
TOTAL PROTEIN: 7.6 g/dL (ref 6.1–8.1)
Total Bilirubin: 0.3 mg/dL (ref 0.2–1.2)

## 2016-12-20 LAB — POCT GLYCOSYLATED HEMOGLOBIN (HGB A1C): Hemoglobin A1C: 6.1

## 2016-12-20 MED ORDER — TRAZODONE HCL 50 MG PO TABS: 25.0000 mg | ORAL_TABLET | Freq: Every day | ORAL | 0 refills | Status: DC

## 2016-12-20 MED ORDER — ZOSTER VACCINE LIVE 19400 UNT/0.65ML ~~LOC~~ SUSR: 0.6500 mL | Freq: Once | SUBCUTANEOUS | 0 refills | Status: AC

## 2016-12-20 NOTE — Progress Notes (Signed)
Subjective:    Patient ID: Beth Hubbard, female    DOB: 07-25-1946, 71 y.o.   MRN: [id number]  Diabetes  She presents for her follow-up diabetic visit. She has type 2 diabetes mellitus. Her disease course has been stable. Pertinent negatives for hypoglycemia include no sweats. Pertinent negatives for diabetes include no blurred vision, no chest pain, no fatigue, no foot paresthesias, no foot ulcerations, no polydipsia, no polyphagia, no polyuria, no visual change, no weakness and no weight loss. Pertinent negatives for diabetic complications include no autonomic neuropathy, CVA, heart disease, impotence, nephropathy or retinopathy. There are no known risk factors for coronary artery disease. Current diabetic treatment includes oral agent (dual therapy). She is compliant with treatment all of the time. She is following a high fat/cholesterol diet.  Hypertension  This is a chronic problem. The problem has been waxing and waning since onset. The problem is controlled. Pertinent negatives include no anxiety, blurred vision, chest pain, malaise/fatigue, neck pain, orthopnea, palpitations, peripheral edema, PND, shortness of breath or sweats. Risk factors for coronary artery disease include diabetes mellitus, female gender and obesity. The current treatment provides moderate improvement. Compliance problems include diet.  There is no history of CVA or retinopathy.  Insomnia  Primary symptoms: fragmented sleep, sleep disturbance, difficulty falling asleep, no malaise/fatigue.  The current episode started in the past 7 days. The onset quality is undetermined. The problem occurs rarely. How many beverages per day that contain caffeine: 0 - 1.  Typical bedtime:  Other.  PMH includes: associated symptoms present, no hypertension, no depression, no family stress or anxiety, no restless leg syndrome, no work related stressors, no chronic pain, no apnea.     Past Medical History:  Diagnosis Date  . Diabetes  mellitus without complication ([place])   . Excessive daytime sleepiness   . High cholesterol   . Hypertension   . Mitral regurgitation    moderate by echo 2015  . OSA (obstructive sleep apnea) 10/18/2015   Mild OSA with AHI 11/hr  . Shingles   . TIA (transient ischemic attack)    Social History   Social History  . Marital status: Legally Separated    Spouse name: N/A  . Number of children: N/A  . Years of education: N/A   Occupational History  . Not on file.   Social History Main Topics  . Smoking status: Never Smoker  . Smokeless tobacco: Never Used  . Alcohol use No  . Drug use: No  . Sexual activity: Not on file   Other Topics Concern  . Not on file   Social History Narrative  . No narrative on file   Review of Systems  Constitutional: Negative for fatigue, malaise/fatigue and weight loss.  Eyes: Negative for blurred vision.  Respiratory: Negative for apnea and shortness of breath.   Cardiovascular: Negative for chest pain, palpitations, orthopnea and PND.  Endocrine: Negative for polydipsia, polyphagia and polyuria.  Genitourinary: Negative for impotence.  Musculoskeletal: Negative for neck pain.  Neurological: Negative for weakness.  Psychiatric/Behavioral: Positive for sleep disturbance. Negative for depression. The patient has insomnia.        Objective:   Physical Exam  Constitutional: She is oriented to person, place, and time. She appears well-developed and well-nourished.  HENT:  Head: Normocephalic and atraumatic.  Right Ear: External ear normal.  Left Ear: External ear normal.  Nose: Nose normal.  Mouth/Throat: Oropharynx is clear and moist.  Eyes: Conjunctivae and EOM are normal. Pupils are equal, round, and  reactive to light.  Neck: Normal range of motion. Neck supple.  Cardiovascular: Normal rate, regular rhythm, normal heart sounds and intact distal pulses.   Pulmonary/Chest: Effort normal and breath sounds normal.  Abdominal: Soft. Bowel  sounds are normal.  Musculoskeletal: Normal range of motion.  Neurological: She is alert and oriented to person, place, and time. She has normal reflexes.  Skin: Skin is warm and dry.  Psychiatric: She has a normal mood and affect. Her behavior is normal. Judgment and thought content normal.          BP 124/60 (BP Location: Left Arm, Patient Position: Sitting, Cuff Size: Large)   Pulse 93   Temp 98.7 F (37.1 C) (Oral)   Resp 16   Ht 5\' 3"  (1.6 m)   Wt 165 lb (74.8 kg)   SpO2 98%   BMI 29.23 kg/m  Assessment & Plan:  1. Essential hypertension Blood pressure is at goal on current medication regimen. No proteinuria noted.  - COMPLETE METABOLIC PANEL WITH GFR - CBC with Differential  2. Diabetes mellitus without complication (HCC) Hemoglobin a1C is 6.1. Will continue current medication regimen. Recommend low carbohydrate diet. The patient is asked to make an attempt to improve diet and exercise patterns to aid in medical management of this problem. - POCT glycosylated hemoglobin (Hb A1C)  3. Immunization due  - Zoster Vaccine Live, PF, (ZOSTAVAX) 25956 UNT/0.65ML injection; Inject 19,400 Units into the skin once.  Dispense: 1 each; Refill: 0  4. Insomnia, unspecified type Will start a trial of Trazadone prior to bed. Discussed sleep regimen at length.  - traZODone (DESYREL) 50 MG tablet; Take 0.5 tablets (25 mg total) by mouth at bedtime.  Dispense: 20 tablet; Refill: 0   RTC: 3 months for chronic conditions    The patient was given clear instructions to go to ER or return to medical center if symptoms do not improve, worsen or new problems develop. The patient verbalized understanding. Will notify patient with laboratory results.  [name], FNP

## 2016-12-21 NOTE — Patient Instructions (Addendum)
Diabetes and Foot Care Diabetes may cause you to have problems because of poor blood supply (circulation) to your feet and legs. This may cause the skin on your feet to become thinner, break easier, and heal more slowly. Your skin may become dry, and the skin may peel and crack. You may also have nerve damage in your legs and feet causing decreased feeling in them. You may not notice minor injuries to your feet that could lead to infections or more serious problems. Taking care of your feet is one of the most important things you can do for yourself. Follow these instructions at home:  Wear shoes at all times, even in the house. Do not go barefoot. Bare feet are easily injured.  Check your feet daily for blisters, cuts, and redness. If you cannot see the bottom of your feet, use a mirror or ask someone for help.  Wash your feet with warm water (do not use hot water) and mild soap. Then pat your feet and the areas between your toes until they are completely dry. Do not soak your feet as this can dry your skin.  Apply a moisturizing lotion or petroleum jelly (that does not contain alcohol and is unscented) to the skin on your feet and to dry, brittle toenails. Do not apply lotion between your toes.  Trim your toenails straight across. Do not dig under them or around the cuticle. File the edges of your nails with an emery board or nail file.  Do not cut corns or calluses or try to remove them with medicine.  Wear clean socks or stockings every day. Make sure they are not too tight. Do not wear knee-high stockings since they may decrease blood flow to your legs.  Wear shoes that fit properly and have enough cushioning. To break in new shoes, wear them for just a few hours a day. This prevents you from injuring your feet. Always look in your shoes before you put them on to be sure there are no objects inside.  Do not cross your legs. This may decrease the blood flow to your feet.  If you find a  minor scrape, cut, or break in the skin on your feet, keep it and the skin around it clean and dry. These areas may be cleansed with mild soap and water. Do not cleanse the area with peroxide, alcohol, or iodine.  When you remove an adhesive bandage, be sure not to damage the skin around it.  If you have a wound, look at it several times a day to make sure it is healing.  Do not use heating pads or hot water bottles. They may burn your skin. If you have lost feeling in your feet or legs, you may not know it is happening until it is too late.  Make sure your health care provider performs a complete foot exam at least annually or more often if you have foot problems. Report any cuts, sores, or bruises to your health care provider immediately. Contact a health care provider if:  You have an injury that is not healing.  You have cuts or breaks in the skin.  You have an ingrown nail.  You notice redness on your legs or feet.  You feel burning or tingling in your legs or feet.  You have pain or cramps in your legs and feet.  Your legs or feet are numb.  Your feet always feel cold. Get help right away if:  There is increasing   redness, swelling, or pain in or around a wound.  There is a red line that goes up your leg.  Pus is coming from a wound.  You develop a fever or as directed by your health care provider.  You notice a bad smell coming from an ulcer or wound. This information is not intended to replace advice given to you by your health care provider. Make sure you discuss any questions you have with your health care provider. Document Released: 11/02/2000 Document Revised: 04/12/2016 Document Reviewed: 04/14/2013 Elsevier Interactive Patient Education  2017 [employer].  Diabetes Mellitus and Exercise Exercising regularly is important for your overall health, especially when you have diabetes (diabetes mellitus). Exercising is not only about losing weight. It has many  health benefits, such as increasing muscle strength and bone density and reducing body fat and stress. This leads to improved fitness, flexibility, and endurance, all of which result in better overall health. Exercise has additional benefits for people with diabetes, including:  Reducing appetite.  Helping to lower and control blood glucose.  Lowering blood pressure.  Helping to control amounts of fatty substances (lipids) in the blood, such as cholesterol and triglycerides.  Helping the body to respond better to insulin (improving insulin sensitivity).  Reducing how much insulin the body needs.  Decreasing the risk for heart disease by:  Lowering cholesterol and triglyceride levels.  Increasing the levels of good cholesterol.  Lowering blood glucose levels. What is my activity plan? Your health care provider or certified diabetes educator can help you make a plan for the type and frequency of exercise (activity plan) that works for you. Make sure that you:  Do at least 150 minutes of moderate-intensity or vigorous-intensity exercise each week. This could be brisk walking, biking, or water aerobics.  Do stretching and strength exercises, such as yoga or weightlifting, at least 2 times a week.  Spread out your activity over at least 3 days of the week.  Get some form of physical activity every day.  Do not go more than 2 days in a row without some kind of physical activity.  Avoid being inactive for more than 90 minutes at a time. Take frequent breaks to walk or stretch.  Choose a type of exercise or activity that you enjoy, and set realistic goals.  Start slowly, and gradually increase the intensity of your exercise over time. What do I need to know about managing my diabetes?  Check your blood glucose before and after exercising.  If your blood glucose is higher than 240 mg/dL (13.3 mmol/L) before you exercise, check your urine for ketones. If you have ketones in your  urine, do not exercise until your blood glucose returns to normal.  Know the symptoms of low blood glucose (hypoglycemia) and how to treat it. Your risk for hypoglycemia increases during and after exercise. Common symptoms of hypoglycemia can include:  Hunger.  Anxiety.  Sweating and feeling clammy.  Confusion.  Dizziness or feeling light-headed.  Increased heart rate or palpitations.  Blurry vision.  Tingling or numbness around the mouth, lips, or tongue.  Tremors or shakes.  Irritability.  Keep a rapid-acting carbohydrate snack available before, during, and after exercise to help prevent or treat hypoglycemia.  Avoid injecting insulin into areas of the body that are going to be exercised. For example, avoid injecting insulin into:  The arms, when playing tennis.  The legs, when jogging.  Keep records of your exercise habits. Doing this can help you and your  health care provider adjust your diabetes management plan as needed. Write down:  Food that you eat before and after you exercise.  Blood glucose levels before and after you exercise.  The type and amount of exercise you have done.  When your insulin is expected to peak, if you use insulin. Avoid exercising at times when your insulin is peaking.  When you start a new exercise or activity, work with your health care provider to make sure the activity is safe for you, and to adjust your insulin, medicines, or food intake as needed.  Drink plenty of water while you exercise to prevent dehydration or heat stroke. Drink enough fluid to keep your urine clear or pale yellow. This information is not intended to replace advice given to you by your health care provider. Make sure you discuss any questions you have with your health care provider. Document Released: 01/26/2004 Document Revised: 05/25/2016 Document Reviewed: 04/16/2016 Elsevier Interactive Patient Education  2017 [employer].

## 2017-01-04 ENCOUNTER — Ambulatory Visit (HOSPITAL_BASED_OUTPATIENT_CLINIC_OR_DEPARTMENT_OTHER): Payer: Medicare Other | Attending: Cardiology | Admitting: Cardiology

## 2017-01-04 VITALS — Ht 62.0 in | Wt 150.0 lb

## 2017-01-04 DIAGNOSIS — G4733 Obstructive sleep apnea (adult) (pediatric): Secondary | ICD-10-CM | POA: Insufficient documentation

## 2017-01-24 ENCOUNTER — Telehealth: Payer: Self-pay | Admitting: *Deleted

## 2017-01-24 NOTE — Telephone Encounter (Signed)
-----   Message from Sueanne Margarita, MD sent at 01/24/2017  1:58 PM EST ----- Pt had successful PAP titration. Please setup appointment in 10 weeks. Please let [facility] know that order for PAP is in EPIC.

## 2017-01-24 NOTE — Telephone Encounter (Signed)
Called the patient and gave her the results of her sleep study, she verbalized understanding and agreed

## 2017-01-24 NOTE — Procedures (Signed)
   Patient Name: Beth Hubbard, Beth Hubbard Date: 01/04/2017 Gender: Female D.O.B: Oct 05, 1946 Age (years): 1 Referring Provider: Fransico Him MD, ABSM Height (inches): 62 Interpreting Physician: Fransico Him MD, ABSM Weight (lbs): 150 RPSGT: Madelon Lips BMI: 27 MRN: [id number] Neck Size: 14.00  CLINICAL INFORMATION The patient is referred for a CPAP titration to treat sleep apnea Date of NPSG, Split Night or HST:12/14/2016  SLEEP STUDY TECHNIQUE As per the [facility] Manual for the Scoring of Sleep and Associated Events v2.3 (April 2016) with a hypopnea requiring 4% desaturations.  The channels recorded and monitored were frontal, central and occipital EEG, electrooculogram (EOG), submentalis EMG (chin), nasal and oral airflow, thoracic and abdominal wall motion, anterior tibialis EMG, snore microphone, electrocardiogram, and pulse oximetry. Continuous positive airway pressure (CPAP) was initiated at the beginning of the study and titrated to treat sleep-disordered breathing.  MEDICATIONS Medications self-administered by patient taken the night of the study : NEURONTIN, [place], CLINORIL, [place] Comments added by technician: NO BATHROOM BREAKS TAKEN. PATIENT TOLERATED CPAP WITHOUT DIFFICUTLY  Comments added by scorer: N/A  RESPIRATORY PARAMETERS Optimal PAP Pressure (cm): 8  AHI at Optimal Pressure (/hr):0.7 Overall Minimal O2 (%):85.00  Supine % at Optimal Pressure (%): 45 Minimal O2 at Optimal Pressure (%): 90.0    SLEEP ARCHITECTURE The study was initiated at 10:00:34 PM and ended at 4:25:21 AM.  Sleep onset time was 19.2 minutes and the sleep efficiency was 87.1%. The total sleep time was 335.0 minutes.  The patient spent 7.76% of the night in stage N1 sleep, 85.97% in stage N2 sleep, 0.00% in stage N3 and 6.27% in REM.Stage REM latency was 33.0 minutes  Wake after sleep onset was 30.6. Alpha intrusion was absent. Supine sleep was 11.57%.  CARDIAC DATA The  2 lead EKG demonstrated sinus rhythm. The mean heart rate was 79.31 beats per minute. Other EKG findings include: None.  LEG MOVEMENT DATA The total Periodic Limb Movements of Sleep (PLMS) were 0. The PLMS index was 0.00. A PLMS index of <15 is considered normal in adults.  IMPRESSIONS - The optimal PAP pressure was 8 cm of water. - Central sleep apnea was not noted during this titration (CAI = 0.0/h). - Moderate oxygen desaturations were observed during this titration (min O2 = 85.00%). - The patient snored with Moderate snoring volume during this titration study. - No cardiac abnormalities were observed during this study. - Clinically significant periodic limb movements were not noted during this study. Arousals associated with PLMs were rare.  DIAGNOSIS - Obstructive Sleep Apnea (327.23 [G47.33 ICD-10])  RECOMMENDATIONS - Trial of CPAP therapy on 8 cm H2O with a Small size Fisher&Paykel Full Face Mask Simplus mask and heated humidification. - Avoid alcohol, sedatives and other CNS depressants that may worsen sleep apnea and disrupt normal sleep architecture. - Sleep hygiene should be reviewed to assess factors that may improve sleep quality. - Weight management and regular exercise should be initiated or continued. - Return to Sleep Center for re-evaluation after 10 weeks of therapy  [place]  ELECTRONICALLY SIGNED ON:  01/24/2017, 1:55 PM [place] PH: [phone]   FX: [phone] [place]

## 2017-03-01 ENCOUNTER — Telehealth: Payer: Self-pay | Admitting: *Deleted

## 2017-03-01 DIAGNOSIS — G4733 Obstructive sleep apnea (adult) (pediatric): Secondary | ICD-10-CM

## 2017-03-01 NOTE — Telephone Encounter (Signed)
Patient called in today stating her pressure is too high on her cpap. She states it is blowing too hard.  To Dr. Radford Pax for advisement

## 2017-03-03 NOTE — Telephone Encounter (Signed)
Please get a 2 week auto titration from 4 to 18cm H2O

## 2017-03-04 NOTE — Telephone Encounter (Signed)
Order sent to [facility] °

## 2017-03-05 ENCOUNTER — Ambulatory Visit: Payer: Self-pay | Admitting: Cardiology

## 2017-03-10 ENCOUNTER — Encounter: Payer: Self-pay | Admitting: Cardiology

## 2017-03-11 NOTE — Telephone Encounter (Signed)
Called the patient to explain that Dr. Radford Pax has ordered a 2 week auto titration for her with [facility]. She verbalized understanding and agrees to the treatment. She understands she will contacted by [facility] to come pick up a loaner machine. She understands the test will take 2 weeks to complete. She was grateful for the call and thanked me.

## 2017-03-12 NOTE — Telephone Encounter (Signed)
Patient called back today to confirm she has received her loaner cpap machine from [facility]

## 2017-03-21 ENCOUNTER — Other Ambulatory Visit: Payer: Self-pay | Admitting: Family Medicine

## 2017-03-21 DIAGNOSIS — I10 Essential (primary) hypertension: Secondary | ICD-10-CM

## 2017-03-22 ENCOUNTER — Other Ambulatory Visit: Payer: Self-pay | Admitting: Family Medicine

## 2017-03-22 DIAGNOSIS — I10 Essential (primary) hypertension: Secondary | ICD-10-CM

## 2017-03-27 ENCOUNTER — Encounter: Payer: Self-pay | Admitting: Cardiology

## 2017-04-02 ENCOUNTER — Encounter: Payer: Self-pay | Admitting: Cardiology

## 2017-04-07 ENCOUNTER — Encounter: Payer: Self-pay | Admitting: Cardiology

## 2017-04-08 ENCOUNTER — Ambulatory Visit (INDEPENDENT_AMBULATORY_CARE_PROVIDER_SITE_OTHER): Payer: Medicare Other | Admitting: Family Medicine

## 2017-04-08 ENCOUNTER — Encounter: Payer: Self-pay | Admitting: Family Medicine

## 2017-04-08 VITALS — BP 138/64 | HR 90 | Temp 99.1°F | Resp 14 | Ht 63.0 in | Wt 170.0 lb

## 2017-04-08 DIAGNOSIS — R21 Rash and other nonspecific skin eruption: Secondary | ICD-10-CM | POA: Diagnosis not present

## 2017-04-08 DIAGNOSIS — J040 Acute laryngitis: Secondary | ICD-10-CM

## 2017-04-08 DIAGNOSIS — E119 Type 2 diabetes mellitus without complications: Secondary | ICD-10-CM | POA: Diagnosis not present

## 2017-04-08 DIAGNOSIS — E785 Hyperlipidemia, unspecified: Secondary | ICD-10-CM

## 2017-04-08 DIAGNOSIS — I10 Essential (primary) hypertension: Secondary | ICD-10-CM | POA: Diagnosis not present

## 2017-04-08 DIAGNOSIS — G4700 Insomnia, unspecified: Secondary | ICD-10-CM | POA: Diagnosis not present

## 2017-04-08 LAB — POCT URINALYSIS DIP (DEVICE)
Glucose, UA: NEGATIVE mg/dL
LEUKOCYTES UA: NEGATIVE
Nitrite: NEGATIVE
PH: 6 (ref 5.0–8.0)
Protein, ur: 30 mg/dL — AB
SPECIFIC GRAVITY, URINE: 1.02 (ref 1.005–1.030)
Urobilinogen, UA: 0.2 mg/dL (ref 0.0–1.0)

## 2017-04-08 LAB — POCT GLYCOSYLATED HEMOGLOBIN (HGB A1C): Hemoglobin A1C: 6.1

## 2017-04-08 MED ORDER — ROSUVASTATIN CALCIUM 20 MG PO TABS: 20.0000 mg | ORAL_TABLET | Freq: Every day | ORAL | 5 refills | Status: DC

## 2017-04-08 MED ORDER — ACETAMINOPHEN 500 MG PO TABS: 500.0000 mg | ORAL_TABLET | Freq: Four times a day (QID) | ORAL | 0 refills | Status: DC | PRN

## 2017-04-08 MED ORDER — SITAGLIPTIN PHOSPHATE 100 MG PO TABS: 100.0000 mg | ORAL_TABLET | Freq: Every day | ORAL | 5 refills | Status: DC

## 2017-04-08 MED ORDER — TRIAMCINOLONE ACETONIDE 0.1 % EX CREA: 1.0000 "application " | TOPICAL_CREAM | Freq: Two times a day (BID) | CUTANEOUS | 2 refills | Status: DC

## 2017-04-08 MED ORDER — TRAZODONE HCL 50 MG PO TABS: 25.0000 mg | ORAL_TABLET | Freq: Every day | ORAL | 1 refills | Status: DC

## 2017-04-08 MED ORDER — AMLODIPINE BESYLATE 5 MG PO TABS: 5.0000 mg | ORAL_TABLET | Freq: Every day | ORAL | 5 refills | Status: DC

## 2017-04-08 NOTE — Progress Notes (Signed)
Subjective:    Patient ID: Beth Hubbard, female    DOB: 1946-10-31, 71 y.o.   MRN: [id number]  Beth Hubbard, a 71 year old female with a history of diabetes mellitus, and chronic knee pain presents for follow up of chronic conditions. She says that she has been taking medications consistently. She has also been followed by pain management for bilateral knee pain.    Diabetes  She presents for her follow-up diabetic visit. She has type 2 diabetes mellitus. Her disease course has been stable. Pertinent negatives for hypoglycemia include no sweats. Pertinent negatives for diabetes include no blurred vision, no chest pain, no fatigue, no foot paresthesias, no foot ulcerations, no polydipsia, no polyphagia, no polyuria, no visual change, no weakness and no weight loss. Pertinent negatives for diabetic complications include no autonomic neuropathy, CVA, heart disease, impotence, nephropathy or retinopathy. There are no known risk factors for coronary artery disease. Current diabetic treatment includes oral agent (dual therapy). She is compliant with treatment all of the time. She is following a high fat/cholesterol diet.  Hypertension  This is a chronic problem. The problem has been waxing and waning since onset. The problem is controlled. Pertinent negatives include no anxiety, blurred vision, chest pain, malaise/fatigue, orthopnea, palpitations, peripheral edema, PND, shortness of breath or sweats. Risk factors for coronary artery disease include diabetes mellitus, female gender and obesity. The current treatment provides moderate improvement. Compliance problems include diet.  There is no history of CVA or retinopathy. There is no history of chronic renal disease.  Insomnia  Primary symptoms: no fragmented sleep, no sleep disturbance, no difficulty falling asleep, no somnolence, no frequent awakening, no premature morning awakening, no malaise/fatigue.  The current episode started in the past 7 days.  The onset quality is undetermined. The problem occurs rarely. How many beverages per day that contain caffeine: 0 - 1.  Typical bedtime:  Other.  PMH includes: associated symptoms present, no hypertension, no depression, no family stress or anxiety, no restless leg syndrome, no work related stressors, no chronic pain, no apnea.   Hyperlipidemia  This is a chronic problem. The problem is controlled. Recent lipid tests were reviewed and are normal. Exacerbating diseases include diabetes and obesity. She has no history of chronic renal disease, hypothyroidism or liver disease. Pertinent negatives include no chest pain, focal sensory loss, leg pain or shortness of breath. Compliance problems include adherence to exercise.     Past Medical History:  Diagnosis Date  . Diabetes mellitus without complication ([place])   . Excessive daytime sleepiness   . High cholesterol   . Hypertension   . Mitral regurgitation    moderate by echo 2015  . OSA (obstructive sleep apnea) 10/18/2015   Mild OSA with AHI 11/hr  . Shingles   . TIA (transient ischemic attack)    Social History   Social History  . Marital status: Legally Separated    Spouse name: N/A  . Number of children: N/A  . Years of education: N/A   Occupational History  . Not on file.   Social History Main Topics  . Smoking status: Never Smoker  . Smokeless tobacco: Never Used  . Alcohol use No  . Drug use: No  . Sexual activity: Not on file   Other Topics Concern  . Not on file   Social History Narrative  . No narrative on file   Review of Systems  Constitutional: Negative for fatigue, malaise/fatigue and weight loss.  HENT: Positive for sore  throat and voice change.   Eyes: Negative.  Negative for blurred vision.  Respiratory: Negative for apnea and shortness of breath.   Cardiovascular: Negative for chest pain, palpitations, orthopnea and PND.  Gastrointestinal: Negative.   Endocrine: Negative for polydipsia, polyphagia and  polyuria.  Genitourinary: Negative for impotence.  Musculoskeletal: Negative.   Skin: Negative.   Neurological: Negative for weakness.  Hematological: Negative.   Psychiatric/Behavioral: Negative for depression and sleep disturbance. The patient has insomnia.        Objective:   Physical Exam  Constitutional: She is oriented to person, place, and time. She appears well-developed and well-nourished.  HENT:  Head: Normocephalic and atraumatic.  Right Ear: External ear normal.  Left Ear: External ear normal.  Nose: Nose normal.  Mouth/Throat: Oropharynx is clear and moist. Abnormal dentition.  Eyes: Conjunctivae and EOM are normal. Pupils are equal, round, and reactive to light.  Neck: Normal range of motion. Neck supple.  Cardiovascular: Normal rate, regular rhythm, normal heart sounds and intact distal pulses.   Pulmonary/Chest: Effort normal and breath sounds normal.  Abdominal: Soft. Bowel sounds are normal.  Musculoskeletal: Normal range of motion.  Neurological: She is alert and oriented to person, place, and time. She has normal reflexes.  Skin: Skin is warm and dry. Rash (bilateral lower extremities ) noted.  Psychiatric: She has a normal mood and affect. Her behavior is normal. Judgment and thought content normal.          BP 138/64 (BP Location: Left Arm, Patient Position: Sitting, Cuff Size: Large) Comment: manual  Pulse 90   Temp 99.1 F (37.3 C) (Oral)   Resp 14   Ht 5\' 3"  (1.6 m)   Wt 170 lb (77.1 kg)   SpO2 96%   BMI 30.11 kg/m  Assessment & Plan:  1. Essential hypertension Blood pressure is at goal on current medication regimen.  - Lipid Panel - COMPLETE METABOLIC PANEL WITH GFR - amLODipine (NORVASC) 5 MG tablet; Take 1 tablet (5 mg total) by mouth daily.  Dispense: 30 tablet; Refill: 5  2. Diabetes mellitus without complication ([place]) Hemoglobin a1C is 6.1, which is at goal. Will continue Januvia at current dosage.  - HgB A1c - sitaGLIPtin (JANUVIA)  100 MG tablet; Take 1 tablet (100 mg total) by mouth daily.  Dispense: 30 tablet; Refill: 5  3. Rash and nonspecific skin eruption - triamcinolone cream (KENALOG) 0.1 %; Apply 1 application topically 2 (two) times daily.  Dispense: 30 g; Refill: 2  4. Insomnia, unspecified type Practice good sleep hygiene.  Stick to a sleep schedule, even on weekends. Exercise is great, but not too late in the day Avoid alcoholic drinks before bed Avoid large meals and beverages late before bed Don't take naps after 3 pm. Keep power naps less than 1 hour.  Relax before bed.  Take a hot bath before bed.  Have a good sleeping environment. Get rid of anything in your bedroom that might distract you from sleep.  Adopt good sleeping posture.   - traZODone (DESYREL) 50 MG tablet; Take 0.5 tablets (25 mg total) by mouth at bedtime.  Dispense: 30 tablet; Refill: 1  5. Hyperlipidemia, unspecified hyperlipidemia type The patient is asked to make an attempt to improve diet and exercise patterns to aid in medical management of this problem.  - rosuvastatin (CRESTOR) 20 MG tablet; Take 1 tablet (20 mg total) by mouth daily.  Dispense: 30 tablet; Refill: 5  7. Laryngitis Recommend throat lozenges and increase water intake.  RTC: 3 months for chronic conditions   [place]  MSN, FNP-C [facility] [address] [place], [place] [address] [phone]

## 2017-04-08 NOTE — Patient Instructions (Addendum)
Laryngitis- Recommend throat lozenges and increase water intake  Hypertension: Continue Amlodipine 5 mg daily. Recommend low fat, low salt diet.   Bilateral knee pain: Follow up with pain management as scheduled  Diabetes mellitus: Continue Januvia 100 mg daily    Laryngitis Laryngitis is swelling (inflammation) of your vocal cords. This causes hoarseness, coughing, loss of voice, sore throat, or a dry throat. When your vocal cords are inflamed, your voice sounds different. Laryngitis can be temporary (acute) or long-term (chronic). Most cases of acute laryngitis improve with time. Chronic laryngitis is laryngitis that lasts for more than three weeks. Follow these instructions at home:  Drink enough fluid to keep your pee (urine) clear or pale yellow.  Breathe in moist air. Use a humidifier if you live in a dry climate.  Take medicines only as told by your doctor.  Do not smoke cigarettes or electronic cigarettes. If you need help quitting, ask your doctor.  Talk as little as possible. Also avoid whispering, which can cause vocal strain.  Write instead of talking. Do this until your voice is back to normal. Contact a doctor if:  You have a fever.  Your pain is worse.  You have trouble swallowing. Get help right away if:  You cough up blood.  You have trouble breathing. This information is not intended to replace advice given to you by your health care provider. Make sure you discuss any questions you have with your health care provider. Document Released: 10/25/2011 Document Revised: 04/12/2016 Document Reviewed: 04/20/2014 Elsevier Interactive Patient Education  2017 [employer].

## 2017-04-09 LAB — COMPLETE METABOLIC PANEL WITH GFR
ALBUMIN: 4.3 g/dL (ref 3.6–5.1)
ALK PHOS: 44 U/L (ref 33–130)
ALT: 19 U/L (ref 6–29)
AST: 19 U/L (ref 10–35)
BUN: 10 mg/dL (ref 7–25)
CALCIUM: 9.4 mg/dL (ref 8.6–10.4)
CHLORIDE: 101 mmol/L (ref 98–110)
CO2: 31 mmol/L (ref 20–31)
Creat: 0.85 mg/dL (ref 0.60–0.93)
GFR, EST AFRICAN AMERICAN: 80 mL/min (ref >=60)
GFR, EST NON AFRICAN AMERICAN: 69 mL/min (ref >=60)
Glucose, Bld: 93 mg/dL (ref 65–99)
POTASSIUM: 3.8 mmol/L (ref 3.5–5.3)
Sodium: 143 mmol/L (ref 135–146)
Total Bilirubin: 0.3 mg/dL (ref 0.2–1.2)
Total Protein: 7.1 g/dL (ref 6.1–8.1)

## 2017-04-09 LAB — LIPID PANEL
CHOL/HDL RATIO: 2.7 ratio (ref <5.0)
Cholesterol: 192 mg/dL (ref <200)
HDL: 72 mg/dL (ref >50)
LDL Cholesterol: 93 mg/dL (ref <100)
TRIGLYCERIDES: 136 mg/dL (ref <150)
VLDL: 27 mg/dL (ref <30)

## 2017-04-11 ENCOUNTER — Other Ambulatory Visit: Payer: Self-pay

## 2017-04-11 ENCOUNTER — Ambulatory Visit (HOSPITAL_COMMUNITY): Payer: Medicare Other | Attending: Cardiovascular Disease

## 2017-04-11 DIAGNOSIS — I700 Atherosclerosis of aorta: Secondary | ICD-10-CM | POA: Insufficient documentation

## 2017-04-11 DIAGNOSIS — I340 Nonrheumatic mitral (valve) insufficiency: Secondary | ICD-10-CM | POA: Insufficient documentation

## 2017-04-17 ENCOUNTER — Encounter: Payer: Medicare Other | Admitting: Cardiology

## 2017-04-17 ENCOUNTER — Encounter: Payer: Self-pay | Admitting: Cardiology

## 2017-04-17 VITALS — BP 140/68 | HR 93 | Ht 63.0 in | Wt 170.8 lb

## 2017-04-17 NOTE — Progress Notes (Deleted)
Cardiology Office Note    Date:  04/17/2017   ID:  Mario, Voong 1946/11/15, MRN [id number]  PCP:  [name], FNP  Cardiologist:  Fransico Him, MD   Chief Complaint  Patient presents with  . Sleep Apnea  . Hypertension  . Mitral Regurgitation    History of Present Illness:  Beth Hubbard is a 71 y.o. female  with a history of DM, dyslipidemia, moderate MR with normal LVF by echo, OSA and HTN who presents today for followup. She has mild OSA with an AHI of 11.9/hr with oxygen desaturations as low as 83%.  She was noncompliant with her usage and insurance made her send it back in.  She was interested in trying the CPAP again so she underwent repeat PSG showing  mild OSA with an AHI of 12.5/hr with oxygen desaturations as low as 81%. She underwent repeat CPAP titration to 8cm H2O.  She is now back for followup of her CPAP device.  She is doing well with her CPAP device. She tolerates her mask and feels that her pressure is adequate enough to sleep well at night.  She does not think that she is snoring.  Since going on CPAP she feels more rested in the am and has less daytime sleepiness.  She denies any anginal chest pain, SOB, DOE (execept with walking or talking too fast), LE edema, dizziness, palpitations or syncope.     Past Medical History:  Diagnosis Date  . Diabetes mellitus without complication ([place])   . Excessive daytime sleepiness   . High cholesterol   . Hypertension   . Mitral regurgitation    moderate by echo 2015  . OSA (obstructive sleep apnea) 10/18/2015   Mild OSA with AHI 12.5/hr on CPAP at 8cm H2O  . Shingles   . TIA (transient ischemic attack)     Past Surgical History:  Procedure Laterality Date  . ABDOMINAL SURGERY    . ROTATOR CUFF REPAIR     bilateral  . TEE WITHOUT CARDIOVERSION N/A 07/20/2016   Procedure: TRANSESOPHAGEAL ECHOCARDIOGRAM (TEE);  Surgeon: Pixie Casino, MD;  Location[facility] ENDOSCOPY;  Service: Cardiovascular;  Laterality:  N/A;    Current Medications: Current Meds  Medication Sig  . ACCU-CHEK AVIVA PLUS test strip Reported on 02/24/2016  . ACCU-CHEK SOFTCLIX LANCETS lancets Check BS once or twice a day.  Marland Kitchen alendronate (FOSAMAX) 70 MG tablet Take 1 tablet (70 mg total) by mouth once a week. Take with a full glass of water on an empty stomach.  Marland Kitchen amLODipine (NORVASC) 5 MG tablet Take 1 tablet (5 mg total) by mouth daily.  . Blood Glucose Monitoring Suppl (ACCU-CHEK AVIVA PLUS) w/Device KIT 1 kit by Does not apply route as directed. Check 1 to 2 times daily  . Calcium Carbonate-Vitamin D (CALCIUM 600/VITAMIN D) 600-400 MG-UNIT chew tablet Chew 1 tablet by mouth daily.  . COMBIGAN 0.2-0.5 % ophthalmic solution Place 1 drop into the left eye 2 (two) times daily.  . cyclobenzaprine (FLEXERIL) 10 MG tablet Take 10 mg by mouth 3 (three) times daily as needed for muscle spasms.  . folic acid (FOLVITE) 443 MCG tablet Take 400 mcg by mouth daily.  Marland Kitchen HYDROcodone-acetaminophen (NORCO/VICODIN) 5-325 MG tablet Take 1 tablet by mouth as directed.  . rosuvastatin (CRESTOR) 20 MG tablet Take 1 tablet (20 mg total) by mouth daily.  . sitaGLIPtin (JANUVIA) 100 MG tablet Take 1 tablet (100 mg total) by mouth daily.  . traZODone (DESYREL)  50 MG tablet Take 0.5 tablets (25 mg total) by mouth at bedtime.  . triamcinolone cream (KENALOG) 0.1 % Apply 1 application topically 2 (two) times daily.    Allergies:   Aspirin; Penicillins; and Nsaids   Social History   Social History  . Marital status: Legally Separated    Spouse name: N/A  . Number of children: N/A  . Years of education: N/A   Social History Main Topics  . Smoking status: Never Smoker  . Smokeless tobacco: Never Used  . Alcohol use No  . Drug use: No  . Sexual activity: Not Asked   Other Topics Concern  . None   Social History Narrative  . None     Family History:  The patient's family history includes Cancer in her brother, father, and sister.   ROS:     Please see the history of present illness.    ROS All other systems reviewed and are negative.  No flowsheet data found.     PHYSICAL EXAM:   VS:  BP 140/68   Pulse 93   Ht 5' 3"  (1.6 m)   Wt 170 lb 12.8 oz (77.5 kg)   SpO2 98%   BMI 30.26 kg/m    GEN: Well nourished, well developed, in no acute distress  HEENT: normal  Neck: no JVD, carotid bruits, or masses Cardiac: RRR; no murmurs, rubs, or gallops,no edema.  Intact distal pulses bilaterally.  Respiratory:  clear to auscultation bilaterally, normal work of breathing GI: soft, nontender, nondistended, + BS MS: no deformity or atrophy  Skin: warm and dry, no rash Neuro:  Alert and Oriented x 3, Strength and sensation are intact Psych: euthymic mood, full affect  Wt Readings from Last 3 Encounters:  04/17/17 170 lb 12.8 oz (77.5 kg)  04/08/17 170 lb (77.1 kg)  01/04/17 150 lb (68 kg)      Studies/Labs Reviewed:   EKG:  EKG is not ordered today.   Recent Labs: 12/20/2016: Hemoglobin 11.5; Platelets 284 04/08/2017: ALT 19; BUN 10; Creat 0.85; Potassium 3.8; Sodium 143   Lipid Panel    Component Value Date/Time   CHOL 192 04/08/2017 0909   TRIG 136 04/08/2017 0909   HDL 72 04/08/2017 0909   CHOLHDL 2.7 04/08/2017 0909   VLDL 27 04/08/2017 0909   LDLCALC 93 04/08/2017 0909    Additional studies/ records that were reviewed today include:  CPAP download    ASSESSMENT:    1. OSA (obstructive sleep apnea)   2. Essential hypertension   3. Mitral valve insufficiency, unspecified etiology      PLAN:  In order of problems listed above:  OSA - the patient is tolerating PAP therapy well without any problems. The PAP download was reviewed today and showed an AHI of 3.4/hr on 8 cm H2O with 60% compliance in using more than 4 hours nightly.  The patient has been using and benefiting from CPAP use and will continue to benefit from therapy.  HTN - her BP is adequately controlled on exam today.  She will continue on  amlodipine 25m daily. Mitral regurgitation - moderate by TEE 07/2016 and mild by echo 03/2017.     Medication Adjustments/Labs and Tests Ordered: Current medicines are reviewed at length with the patient today.  Concerns regarding medicines are outlined above.  Medication changes, Labs and Tests ordered today are listed in the Patient Instructions below.  There are no Patient Instructions on file for this visit.   Signed, TFransico Him MD  04/17/2017 3:12 PM    [address], [place],   [address] Phone: [phone]; Fax: [phone]

## 2017-04-17 NOTE — Telephone Encounter (Signed)
Patient was in the office today for her sleep appointment and patient states she no longer has her cpap machine. Patient states on Mar 19 2017 she went to [facility] to pick up the loaner machine for a 2 week autotitration. Patient states she had to leave her  cpap machine at [facility]. Patient was instructed to use the loaner machine for 2 weeks and bring it back on May 14 or 15 2018. Patient states after 2 weeks she took the loaner machine back on Apr 02 2017 and at that time [facility] would not return her cpap machine to her but told her she would hear from her doctor.  Nina (cpap assistant) called [facility] spoke to [place] to inquire what happened on Apr 02 2017 the day the patient returned the loaner machine. [place] states the patient came into the store and returned both her machine and the loaner AMA (against medical advice) without any explanation.  Nina (cpap assistant) called [facility] a second time spoke to [name] to see how the patient could get her cpap machine back. [name] reached out to the referral team for advice and returned to say the cpap machine can can be returned to the patient provided she makes an appointment to come into [facility] within 30 days to fill out the paperwork.Gae Bon (cpap assistant) explained this to the patient and gave her the number to call [facility] at [phone] ext. [phone]  Patient was encouraged to call [facility] asap.

## 2017-04-18 NOTE — Progress Notes (Signed)
This encounter was created in error - please disregard.

## 2017-04-19 ENCOUNTER — Telehealth: Payer: Self-pay | Admitting: *Deleted

## 2017-04-19 NOTE — Telephone Encounter (Signed)
Gave patient recommended pressure changes made by Dr. Radford Pax and understanding was verbalized. Patient understands she will be contacted by [facility] to set up her cpap. She understands to call if [facility] does not contact her with new setup in a timely manner. Patient understands she has an appointment on April 22 2017 at 3 pm with [facility]. She understands she will be called once confirmation has been received from [facility] that she has received her new machine to schedule 10 week follow up appointment. [place] Corene Cornea) states patient should be seen by 05/08/2017 for insurance compliance.  [place] notified of new pressure order in epic She was grateful for the call and thanked me

## 2017-04-19 NOTE — Telephone Encounter (Signed)
-----   Message from Sueanne Margarita, MD sent at 04/19/2017 10:56 AM EDT ----- Please set CPAP on 10cm H2O and get a download in 2 weeks

## 2017-04-19 NOTE — Telephone Encounter (Signed)
2 week auto fax has been received 04/17/2017 and sent to scan.

## 2017-04-25 NOTE — Telephone Encounter (Signed)
Patient is scheduled for June 19 at 11:00 am 2018 for her 10 week sleep follow up. Patient notified.

## 2017-05-01 ENCOUNTER — Encounter: Payer: Self-pay | Admitting: Cardiology

## 2017-05-02 ENCOUNTER — Other Ambulatory Visit: Payer: Self-pay | Admitting: Family Medicine

## 2017-05-02 DIAGNOSIS — Z1231 Encounter for screening mammogram for malignant neoplasm of breast: Secondary | ICD-10-CM

## 2017-05-06 ENCOUNTER — Encounter: Payer: Self-pay | Admitting: Cardiology

## 2017-05-06 NOTE — Progress Notes (Signed)
Cardiology Office Note    Date:  05/07/2017   ID:  Khianna, Blazina 1946-03-04, MRN [id number]  PCP:  [name], FNP  Cardiologist:  Fransico Him, MD   Chief Complaint  Patient presents with  . Follow-up    mitral regurgitation, OSA    History of Present Illness:  Beth Hubbard is a 71 y.o. female with a history of DM, dyslipidemia, moderate MR with normal LVF by echo, OSA on CPAP and HTN.  She is here today for followup and is doing well. She has mild OSA with an AHI of 11.9/hr with oxygen desaturations as low as 83% and is on CPAP at 7cm H2O. Unfortunately she did not tolerate the 7cm H2O and handed her CPAP device in prior to the last OV because she was told that insurance would not continue to pay for it as she was not compliant.  She underwent repeat split night study and is now on CPAP at 10cm H2O.     She is doing well with her CPAP device.  She is tolerating the nasal pillow mask and feels the pressure is adequate.  Since going on PAP she feels more rested in the am and has less daytime sleepiness.  She has some dry mouth and nasal dryness.  She denies any anginal chest pain, SOB, DOE, LE edema, dizziness, palpitations or syncope.     Past Medical History:  Diagnosis Date  . Diabetes mellitus without complication ([place])   . Excessive daytime sleepiness   . High cholesterol   . Hypertension   . Mitral regurgitation    mild by echo 03/2017  . OSA (obstructive sleep apnea) 10/18/2015   Mild OSA with AHI 12.5/hr on CPAP at 8cm H2O  . Shingles   . TIA (transient ischemic attack)     Past Surgical History:  Procedure Laterality Date  . ABDOMINAL SURGERY    . ROTATOR CUFF REPAIR     bilateral  . TEE WITHOUT CARDIOVERSION N/A 07/20/2016   Procedure: TRANSESOPHAGEAL ECHOCARDIOGRAM (TEE);  Surgeon: Pixie Casino, MD;  [facility]: [facility] ENDOSCOPY;  Service: Cardiovascular;  Laterality: N/A;    Current Medications: Current Meds  Medication Sig  . ACCU-CHEK AVIVA  PLUS test strip Reported on 02/24/2016  . ACCU-CHEK SOFTCLIX LANCETS lancets Check BS once or twice a day.  Marland Kitchen amLODipine (NORVASC) 5 MG tablet Take 1 tablet (5 mg total) by mouth daily.  . Blood Glucose Monitoring Suppl (ACCU-CHEK AVIVA PLUS) w/Device KIT 1 kit by Does not apply route as directed. Check 1 to 2 times daily  . Calcium Carbonate-Vitamin D (CALCIUM 600/VITAMIN D) 600-400 MG-UNIT chew tablet Chew 1 tablet by mouth daily.  . COMBIGAN 0.2-0.5 % ophthalmic solution Place 1 drop into the left eye 2 (two) times daily.  . cyclobenzaprine (FLEXERIL) 10 MG tablet Take 10 mg by mouth 3 (three) times daily as needed for muscle spasms.  Marland Kitchen dexamethasone (DECADRON) 4 MG tablet Take 4 mg by mouth 2 (two) times daily with a meal.  . ferrous sulfate 325 (65 FE) MG tablet Take 325 mg by mouth as directed.  . folic acid (FOLVITE) 409 MCG tablet Take 400 mcg by mouth daily.  Marland Kitchen HYDROcodone-acetaminophen (NORCO/VICODIN) 5-325 MG tablet Take 1 tablet by mouth as directed.  . rosuvastatin (CRESTOR) 20 MG tablet Take 1 tablet (20 mg total) by mouth daily.  . sitaGLIPtin (JANUVIA) 100 MG tablet Take 1 tablet (100 mg total) by mouth daily.  Marland Kitchen triamcinolone cream (KENALOG)  0.1 % Apply 1 application topically 2 (two) times daily.    Allergies:   Aspirin; Penicillins; and Nsaids   Social History   Social History  . Marital status: Legally Separated    Spouse name: N/A  . Number of children: N/A  . Years of education: N/A   Social History Main Topics  . Smoking status: Never Smoker  . Smokeless tobacco: Never Used  . Alcohol use No  . Drug use: No  . Sexual activity: Not Asked   Other Topics Concern  . None   Social History Narrative  . None     Family History:  The patient's family history includes Cancer in her brother, father, and sister.   ROS:   Please see the history of present illness.    ROS All other systems reviewed and are negative.  No flowsheet data found.     PHYSICAL  EXAM:   VS:  BP 128/68   Pulse 86   Ht 5' 3"  (1.6 m)   Wt 168 lb 1.9 oz (76.3 kg)   SpO2 94%   BMI 29.78 kg/m    GEN: Well nourished, well developed, in no acute distress  HEENT: normal  Neck: no JVD, carotid bruits, or masses Cardiac: RRR; no murmurs, rubs, or gallops,no edema.  Intact distal pulses bilaterally.  Respiratory:  clear to auscultation bilaterally, normal work of breathing GI: soft, nontender, nondistended, + BS MS: no deformity or atrophy  Skin: warm and dry, no rash Neuro:  Alert and Oriented x 3, Strength and sensation are intact Psych: euthymic mood, full affect  Wt Readings from Last 3 Encounters:  05/07/17 168 lb 1.9 oz (76.3 kg)  04/17/17 170 lb 12.8 oz (77.5 kg)  04/08/17 170 lb (77.1 kg)      Studies/Labs Reviewed:   EKG:  EKG is not ordered today.   Recent Labs: 12/20/2016: Hemoglobin 11.5; Platelets 284 04/08/2017: ALT 19; BUN 10; Creat 0.85; Potassium 3.8; Sodium 143   Lipid Panel    Component Value Date/Time   CHOL 192 04/08/2017 0909   TRIG 136 04/08/2017 0909   HDL 72 04/08/2017 0909   CHOLHDL 2.7 04/08/2017 0909   VLDL 27 04/08/2017 0909   LDLCALC 93 04/08/2017 0909    Additional studies/ records that were reviewed today include:  CPAP download    ASSESSMENT:    1. OSA (obstructive sleep apnea)   2. Essential hypertension   3. Non-rheumatic mitral regurgitation      PLAN:  In order of problems listed above:  OSA - the patient is tolerating PAP therapy well without any problems. The PAP download was reviewed today and showed an AHI of 3.6/hr on 10 cm H2O with 23% compliance in using more than 4 hours nightly but she did not start on it until  the 4th of June and download was taken starting 5/15.  She is trying to use it more than 4 hours nightly.   The patient has been using and benefiting from CPAP use and will continue to benefit from therapy.  I encouraged her to go to [facility] to see if she may need the RAMP lengthened as she is  having problems with the air being too much if she goes to the bathroom in the middle of the night and then puts it back on. HTN - BP controlled on exam today.  She will continue on amlodipine 40m daily. Moderate MR by TEE 07/2016 - she is asymptomatic.  Repeat echo 03/2017 with mild  MR.     Medication Adjustments/Labs and Tests Ordered: Current medicines are reviewed at length with the patient today.  Concerns regarding medicines are outlined above.  Medication changes, Labs and Tests ordered today are listed in the Patient Instructions below.  There are no Patient Instructions on file for this visit.   Signed, Fransico Him, MD  05/07/2017 11:18 AM    [facility] [address], [place], [place]  [address] Phone: [phone]; Fax: [phone]

## 2017-05-07 ENCOUNTER — Ambulatory Visit (INDEPENDENT_AMBULATORY_CARE_PROVIDER_SITE_OTHER): Payer: Medicare Other | Admitting: Cardiology

## 2017-05-07 ENCOUNTER — Encounter: Payer: Self-pay | Admitting: Cardiology

## 2017-05-07 VITALS — BP 128/68 | HR 86 | Ht 63.0 in | Wt 168.1 lb

## 2017-05-07 DIAGNOSIS — I340 Nonrheumatic mitral (valve) insufficiency: Secondary | ICD-10-CM

## 2017-05-07 DIAGNOSIS — G4733 Obstructive sleep apnea (adult) (pediatric): Secondary | ICD-10-CM | POA: Diagnosis not present

## 2017-05-07 DIAGNOSIS — I10 Essential (primary) hypertension: Secondary | ICD-10-CM | POA: Diagnosis not present

## 2017-05-07 NOTE — Patient Instructions (Signed)
Medication Instructions:  Your physician recommends that you continue on your current medications as directed. Please refer to the Current Medication list given to you today.   Labwork: None  Testing/Procedures: None  Follow-Up: [facility] will call you to schedule an appointment to adjust the ramp on your PAP.  Your physician wants you to follow-up in: 1 year with Dr. Radford Pax. You will receive a reminder letter in the mail two months in advance. If you don't receive a letter, please call our office to schedule the follow-up appointment.   Any Other Special Instructions Will Be Listed Below (If Applicable).     If you need a refill on your cardiac medications before your next appointment, please call your pharmacy.

## 2017-05-08 NOTE — Telephone Encounter (Signed)
2 week download printed, sent to scan

## 2017-05-10 ENCOUNTER — Telehealth: Payer: Self-pay | Admitting: *Deleted

## 2017-05-10 NOTE — Telephone Encounter (Signed)
Beth Hubbard [facility]) will call patient to schedule an appointment to lengthen her ramp.6/22

## 2017-05-10 NOTE — Telephone Encounter (Signed)
-----   Message from Theodoro Parma, RN sent at 05/07/2017 12:26 PM EDT ----- Regarding: DME help Melissa - Please have a tech call and schedule an appointment with this patient. She needs to discuss lengthening the ramp on her PAP for comfort.  Thanks!  Gae Bon- you do not need to call this patient. This is just to make you aware in case she calls you later.

## 2017-05-21 ENCOUNTER — Ambulatory Visit: Payer: Self-pay

## 2017-05-23 ENCOUNTER — Encounter: Payer: Self-pay | Admitting: Cardiology

## 2017-05-23 ENCOUNTER — Encounter: Payer: Self-pay | Admitting: Family Medicine

## 2017-05-28 ENCOUNTER — Ambulatory Visit: Payer: Self-pay

## 2017-06-03 NOTE — Telephone Encounter (Signed)
Patient has a new compliance appointment date set for July 18 2017 at 2 pm. Patient agrees to this appointment and understands she must keep this appointment for insurance compliance. Her compliance range is 7/5-9/4.

## 2017-06-04 ENCOUNTER — Telehealth: Payer: Self-pay | Admitting: *Deleted

## 2017-06-04 ENCOUNTER — Telehealth: Payer: Self-pay | Admitting: Cardiology

## 2017-06-04 NOTE — Telephone Encounter (Signed)
Walk In Pt form-[facility] paper dropped off placed in [facility] doc box.

## 2017-06-04 NOTE — Telephone Encounter (Signed)
Informed patient of com[pliance results and she verbalized understanding. Patient understands her settings will not change. Patient was grateful for the call and thanked me.

## 2017-06-04 NOTE — Telephone Encounter (Signed)
-----   Message from Sueanne Margarita, MD sent at 05/30/2017  3:44 PM EDT ----- Good AHI and compliance.  Continue current CPAP settings.

## 2017-06-27 ENCOUNTER — Ambulatory Visit: Payer: Self-pay | Admitting: Family Medicine

## 2017-06-28 ENCOUNTER — Ambulatory Visit (INDEPENDENT_AMBULATORY_CARE_PROVIDER_SITE_OTHER): Payer: Medicare Other | Admitting: Family Medicine

## 2017-06-28 ENCOUNTER — Encounter: Payer: Self-pay | Admitting: Family Medicine

## 2017-06-28 VITALS — BP 130/72 | HR 93 | Temp 98.5°F | Resp 16 | Ht 63.0 in | Wt 171.0 lb

## 2017-06-28 DIAGNOSIS — I10 Essential (primary) hypertension: Secondary | ICD-10-CM | POA: Diagnosis not present

## 2017-06-28 DIAGNOSIS — M79606 Pain in leg, unspecified: Secondary | ICD-10-CM | POA: Diagnosis not present

## 2017-06-28 DIAGNOSIS — R252 Cramp and spasm: Secondary | ICD-10-CM

## 2017-06-28 DIAGNOSIS — M7989 Other specified soft tissue disorders: Secondary | ICD-10-CM | POA: Diagnosis not present

## 2017-06-28 DIAGNOSIS — E119 Type 2 diabetes mellitus without complications: Secondary | ICD-10-CM | POA: Diagnosis not present

## 2017-06-28 LAB — CBC WITH DIFFERENTIAL/PLATELET
Basophils Absolute: 0 cells/uL (ref 0–200)
Basophils Relative: 0 %
EOS PCT: 2 %
Eosinophils Absolute: 138 cells/uL (ref 15–500)
HCT: 35.1 % (ref 35.0–45.0)
Hemoglobin: 11.2 g/dL — ABNORMAL LOW (ref 11.7–15.5)
LYMPHS PCT: 37 %
Lymphs Abs: 2553 cells/uL (ref 850–3900)
MCH: 31.1 pg (ref 27.0–33.0)
MCHC: 31.9 g/dL — AB (ref 32.0–36.0)
MCV: 97.5 fL (ref 80.0–100.0)
MPV: 9.7 fL (ref 7.5–12.5)
Monocytes Absolute: 483 cells/uL (ref 200–950)
Monocytes Relative: 7 %
NEUTROS PCT: 54 %
Neutro Abs: 3726 cells/uL (ref 1500–7800)
PLATELETS: 242 10*3/uL (ref 140–400)
RBC: 3.6 MIL/uL — AB (ref 3.80–5.10)
RDW: 14.7 % (ref 11.0–15.0)
WBC: 6.9 10*3/uL (ref 3.8–10.8)

## 2017-06-28 LAB — COMPLETE METABOLIC PANEL WITH GFR
ALT: 29 U/L (ref 6–29)
AST: 29 U/L (ref 10–35)
Albumin: 4.2 g/dL (ref 3.6–5.1)
Alkaline Phosphatase: 54 U/L (ref 33–130)
BILIRUBIN TOTAL: 0.3 mg/dL (ref 0.2–1.2)
BUN: 10 mg/dL (ref 7–25)
CALCIUM: 9.2 mg/dL (ref 8.6–10.4)
CHLORIDE: 101 mmol/L (ref 98–110)
CO2: 29 mmol/L (ref 20–32)
CREATININE: 0.87 mg/dL (ref 0.60–0.93)
GFR, EST AFRICAN AMERICAN: 78 mL/min (ref >=60)
GFR, Est Non African American: 67 mL/min (ref >=60)
Glucose, Bld: 84 mg/dL (ref 65–99)
Potassium: 3.7 mmol/L (ref 3.5–5.3)
Sodium: 140 mmol/L (ref 135–146)
TOTAL PROTEIN: 7 g/dL (ref 6.1–8.1)

## 2017-06-28 LAB — POCT GLYCOSYLATED HEMOGLOBIN (HGB A1C): HEMOGLOBIN A1C: 6

## 2017-06-28 MED ORDER — SITAGLIPTIN PHOSPHATE 50 MG PO TABS: 50.0000 mg | ORAL_TABLET | Freq: Every day | ORAL | 5 refills | Status: DC

## 2017-06-28 NOTE — Patient Instructions (Addendum)
Will follow up by phone with any abnormal laboratory results.  Hemoglobin a1C is 6.0, will decrease Januvia to 50 mg daily. Will order compression stocking to increase blood flow to lower extremities        How to Use Compression Stockings Compression stockings are elastic socks that squeeze the legs. They help to increase blood flow to the legs, decrease swelling in the legs, and reduce the chance of developing blood clots in the lower legs. Compression stockings are often used by people who:  Are recovering from surgery.  Have poor circulation in their legs.  Are prone to getting blood clots in their legs.  Have varicose veins.  Sit or stay in bed for long periods of time.  How to use compression stockings Before you put on your compression stockings:  Make sure that they are the correct size. If you do not know your size, ask your health care provider.  Make sure that they are clean, dry, and in good condition.  Check them for rips and tears. Do not put them on if they are ripped or torn.  Put your stockings on first thing in the morning, before you get out of bed. Keep them on for as long as your health care provider advises. When you are wearing your stockings:  Keep them as smooth as possible. Do not allow them to bunch up. It is especially important to prevent the stockings from bunching up around your toes or behind your knees.  Do not roll the stockings downward and leave them rolled down. This can decrease blood flow to your leg.  Change them right away if they become wet or dirty. 1.   When you take off your stockings, inspect your legs and feet. Anything that does not seem normal may require medical attention. Look for:  Open sores.  Red spots.  Swelling.  Information and tips  Do not stop wearing your compression stockings without talking to your health care provider first.  Wash your stockings every day with mild detergent in cold or warm water. Do  not use bleach. Air-dry your stockings or dry them in a clothes dryer on low heat.  Replace your stockings every 3-6 months.  If skin moisturizing is part of your treatment plan, apply lotion or cream at night so that your skin will be dry when you put on the stockings in the morning. It is harder to put the stockings on when you have lotion on your legs or feet. Contact a health care provider if: Remove your stockings and seek medical care if:  You have a feeling of pins and needles in your feet or legs.  You have any new changes in your skin.  You have skin lesions that are getting worse.  You have swelling or pain that is getting worse.  Get help right away if:  You have numbness or tingling in your lower legs that does not get better right after you take the stockings off.  Your toes or feet become cold and blue.  You develop open sores or red spots on your legs that do not go away.  You see or feel a warm spot on your leg.  You have new swelling or soreness in your leg.  You are short of breath or you have chest pain for no reason.  You have a rapid or irregular heartbeat.  You feel light-headed or dizzy. This information is not intended to replace advice given to you by your health  care provider. Make sure you discuss any questions you have with your health care provider. Document Released: 09/02/2009 Document Revised: 04/04/2016 Document Reviewed: 10/13/2014 Elsevier Interactive Patient Education  2018 [place] Eating Plan DASH stands for "Dietary Approaches to Stop Hypertension." The DASH eating plan is a healthy eating plan that has been shown to reduce high blood pressure (hypertension). It may also reduce your risk for type 2 diabetes, heart disease, and stroke. The DASH eating plan may also help with weight loss. What are tips for following this plan? General guidelines  Avoid eating more than 2,300 mg (milligrams) of salt (sodium) a day. If you  have hypertension, you may need to reduce your sodium intake to 1,500 mg a day.  Limit alcohol intake to no more than 1 drink a day for nonpregnant women and 2 drinks a day for men. One drink equals 12 oz of beer, 5 oz of wine, or 1 oz of hard liquor.  Work with your health care provider to maintain a healthy body weight or to lose weight. Ask what an ideal weight is for you.  Get at least 30 minutes of exercise that causes your heart to beat faster (aerobic exercise) most days of the week. Activities may include walking, swimming, or biking.  Work with your health care provider or diet and nutrition specialist (dietitian) to adjust your eating plan to your individual calorie needs. Reading food labels  Check food labels for the amount of sodium per serving. Choose foods with less than 5 percent of the Daily Value of sodium. Generally, foods with less than 300 mg of sodium per serving fit into this eating plan.  To find whole grains, look for the word "whole" as the first word in the ingredient list. Shopping  Buy products labeled as "low-sodium" or "no salt added."  Buy fresh foods. Avoid canned foods and premade or frozen meals. Cooking  Avoid adding salt when cooking. Use salt-free seasonings or herbs instead of table salt or sea salt. Check with your health care provider or pharmacist before using salt substitutes.  Do not fry foods. Cook foods using healthy methods such as baking, boiling, grilling, and broiling instead.  Cook with heart-healthy oils, such as olive, canola, soybean, or sunflower oil. Meal planning   Eat a balanced diet that includes: ? 5 or more servings of fruits and vegetables each day. At each meal, try to fill half of your plate with fruits and vegetables. ? Up to 6-8 servings of whole grains each day. ? Less than 6 oz of lean meat, poultry, or fish each day. A 3-oz serving of meat is about the same size as a deck of cards. One egg equals 1 oz. ? 2 servings  of low-fat dairy each day. ? A serving of nuts, seeds, or beans 5 times each week. ? Heart-healthy fats. Healthy fats called Omega-3 fatty acids are found in foods such as flaxseeds and coldwater fish, like sardines, salmon, and mackerel.  Limit how much you eat of the following: ? Canned or prepackaged foods. ? Food that is high in trans fat, such as fried foods. ? Food that is high in saturated fat, such as fatty meat. ? Sweets, desserts, sugary drinks, and other foods with added sugar. ? Full-fat dairy products.  Do not salt foods before eating.  Try to eat at least 2 vegetarian meals each week.  Eat more home-cooked food and less restaurant, buffet, and fast food.  When eating at  a restaurant, ask that your food be prepared with less salt or no salt, if possible. What foods are recommended? The items listed may not be a complete list. Talk with your dietitian about what dietary choices are best for you. Grains Whole-grain or whole-wheat bread. Whole-grain or whole-wheat pasta. Brown rice. [place]. Bulgur. Whole-grain and low-sodium cereals. Pita bread. Low-fat, low-sodium crackers. Whole-wheat flour tortillas. Vegetables Fresh or frozen vegetables (raw, steamed, roasted, or grilled). Low-sodium or reduced-sodium tomato and vegetable juice. Low-sodium or reduced-sodium tomato sauce and tomato paste. Low-sodium or reduced-sodium canned vegetables. Fruits All fresh, dried, or frozen fruit. Canned fruit in natural juice (without added sugar). Meat and other protein foods Skinless chicken or [place]. Ground chicken or [place]. Pork with fat trimmed off. Fish and seafood. Egg whites. Dried beans, peas, or lentils. Unsalted nuts, nut butters, and seeds. Unsalted canned beans. Lean cuts of beef with fat trimmed off. Low-sodium, lean deli meat. Dairy Low-fat (1%) or fat-free (skim) milk. Fat-free, low-fat, or reduced-fat cheeses. Nonfat, low-sodium ricotta or cottage cheese. Low-fat or  nonfat yogurt. Low-fat, low-sodium cheese. Fats and oils Soft margarine without trans fats. Vegetable oil. Low-fat, reduced-fat, or light mayonnaise and salad dressings (reduced-sodium). Canola, safflower, olive, soybean, and sunflower oils. Avocado. Seasoning and other foods Herbs. Spices. Seasoning mixes without salt. Unsalted popcorn and pretzels. Fat-free sweets. What foods are not recommended? The items listed may not be a complete list. Talk with your dietitian about what dietary choices are best for you. Grains Baked goods made with fat, such as croissants, muffins, or some breads. Dry pasta or rice meal packs. Vegetables Creamed or fried vegetables. Vegetables in a cheese sauce. Regular canned vegetables (not low-sodium or reduced-sodium). Regular canned tomato sauce and paste (not low-sodium or reduced-sodium). Regular tomato and vegetable juice (not low-sodium or reduced-sodium). Angie Fava. Olives. Fruits Canned fruit in a light or heavy syrup. Fried fruit. Fruit in cream or butter sauce. Meat and other protein foods Fatty cuts of meat. Ribs. Fried meat. Berniece Salines. Sausage. Bologna and other processed lunch meats. Salami. Fatback. Hotdogs. Bratwurst. Salted nuts and seeds. Canned beans with added salt. Canned or smoked fish. Whole eggs or egg yolks. Chicken or [place] with skin. Dairy Whole or 2% milk, cream, and half-and-half. Whole or full-fat cream cheese. Whole-fat or sweetened yogurt. Full-fat cheese. Nondairy creamers. Whipped toppings. Processed cheese and cheese spreads. Fats and oils Butter. Stick margarine. Lard. Shortening. Ghee. Bacon fat. Tropical oils, such as coconut, palm kernel, or palm oil. Seasoning and other foods Salted popcorn and pretzels. Onion salt, garlic salt, seasoned salt, table salt, and sea salt. Worcestershire sauce. Tartar sauce. Barbecue sauce. Teriyaki sauce. Soy sauce, including reduced-sodium. Steak sauce. Canned and packaged gravies. Fish sauce. Oyster  sauce. Cocktail sauce. Horseradish that you find on the shelf. Ketchup. Mustard. Meat flavorings and tenderizers. Bouillon cubes. Hot sauce and Tabasco sauce. Premade or packaged marinades. Premade or packaged taco seasonings. Relishes. Regular salad dressings. Where to find more information:  National Heart, Lung, and [place]: https://wilson-eaton.com/  American Heart Association: www.heart.org Summary  The DASH eating plan is a healthy eating plan that has been shown to reduce high blood pressure (hypertension). It may also reduce your risk for type 2 diabetes, heart disease, and stroke.  With the DASH eating plan, you should limit salt (sodium) intake to 2,300 mg a day. If you have hypertension, you may need to reduce your sodium intake to 1,500 mg a day.  When on the DASH eating plan, aim to eat  more fresh fruits and vegetables, whole grains, lean proteins, low-fat dairy, and heart-healthy fats.  Work with your health care provider or diet and nutrition specialist (dietitian) to adjust your eating plan to your individual calorie needs. This information is not intended to replace advice given to you by your health care provider. Make sure you discuss any questions you have with your health care provider. Document Released: 10/25/2011 Document Revised: 10/29/2016 Document Reviewed: 10/29/2016 Elsevier Interactive Patient Education  2017 [place].    Hypokalemia Hypokalemia means that the amount of potassium in the blood is lower than normal.Potassium is a chemical that helps regulate the amount of fluid in the body (electrolyte). It also stimulates muscle tightening (contraction) and helps nerves work properly.Normally, most of the body's potassium is inside of cells, and only a very small amount is in the blood. Because the amount in the blood is so small, minor changes to potassium levels in the blood can be life-threatening. What are the causes? This condition may be caused  by:  Antibiotic medicine.  Diarrhea or vomiting. Taking too much of a medicine that helps you have a bowel movement (laxative) can cause diarrhea and lead to hypokalemia.  Chronic kidney disease (CKD).  Medicines that help the body get rid of excess fluid (diuretics).  Eating disorders, such as bulimia.  Low magnesium levels in the body.  Sweating a lot.  What are the signs or symptoms? Symptoms of this condition include:  Weakness.  Constipation.  Fatigue.  Muscle cramps.  Mental confusion.  Skipped heartbeats or irregular heartbeat (palpitations).  Tingling or numbness.  How is this diagnosed? This condition is diagnosed with a blood test. How is this treated? Hypokalemia can be treated by taking potassium supplements by mouth or adjusting the medicines that you take. Treatment may also include eating more foods that contain a lot of potassium. If your potassium level is very low, you may need to get potassium through an IV tube in one of your veins and be monitored in the hospital. Follow these instructions at home:  Take over-the-counter and prescription medicines only as told by your health care provider. This includes vitamins and supplements.  Eat a healthy diet. A healthy diet includes fresh fruits and vegetables, whole grains, healthy fats, and lean proteins.  If instructed, eat more foods that contain a lot of potassium, such as: ? Nuts, such as peanuts and pistachios. ? Seeds, such as sunflower seeds and pumpkin seeds. ? Peas, lentils, and lima beans. ? Whole grain and bran cereals and breads. ? Fresh fruits and vegetables, such as apricots, avocado, bananas, cantaloupe, kiwi, oranges, tomatoes, asparagus, and potatoes. ? Orange juice. ? Tomato juice. ? Red meats. ? Yogurt.  Keep all follow-up visits as told by your health care provider. This is important. Contact a health care provider if:  You have weakness that gets worse.  You feel your heart  pounding or racing.  You vomit.  You have diarrhea.  You have diabetes (diabetes mellitus) and you have trouble keeping your blood sugar (glucose) in your target range. Get help right away if:  You have chest pain.  You have shortness of breath.  You have vomiting or diarrhea that lasts for more than 2 days.  You faint. This information is not intended to replace advice given to you by your health care provider. Make sure you discuss any questions you have with your health care provider. Document Released: 11/05/2005 Document Revised: 06/23/2016 Document Reviewed: 06/23/2016 Elsevier Interactive Patient  Education  2018 [employer]vier Inc.  

## 2017-06-28 NOTE — Progress Notes (Signed)
Subjective:    Patient ID: Beth Hubbard, female    DOB: September 03, 1946, 71 y.o.   MRN: [id number]  Ms. Halimah Bewick, a 71 year old female with a history of diabetes mellitus, and chronic knee pain presents for follow up of chronic conditions. She says that she has been taking medications consistently. She has also been followed by pain management for bilateral knee pain. Ms. Schermer is complaining to increased cramping to lower extremities. She experiences cramping periodically. She says that she has not had adequate fluid intake over the past week. She denies headache, fatigue, dysuria, abdominal pain, nausea or vomiting.    Hypertension  This is a chronic problem. The problem has been waxing and waning since onset. The problem is controlled. Pertinent negatives include no anxiety, blurred vision, chest pain, malaise/fatigue, orthopnea, palpitations, peripheral edema, PND, shortness of breath or sweats. Risk factors for coronary artery disease include diabetes mellitus, female gender and obesity. The current treatment provides moderate improvement. Compliance problems include diet.  There is no history of CVA or retinopathy. There is no history of chronic renal disease.  Diabetes  She presents for her follow-up diabetic visit. She has type 2 diabetes mellitus. Her disease course has been stable. Pertinent negatives for hypoglycemia include no sweats. Pertinent negatives for diabetes include no blurred vision, no chest pain, no fatigue, no foot paresthesias, no foot ulcerations, no polydipsia, no polyphagia, no polyuria, no visual change, no weakness and no weight loss. Pertinent negatives for diabetic complications include no autonomic neuropathy, CVA, heart disease, impotence, nephropathy or retinopathy. There are no known risk factors for coronary artery disease. Current diabetic treatment includes oral agent (dual therapy). She is compliant with treatment all of the time. She is following a high  fat/cholesterol diet.    Past Medical History:  Diagnosis Date  . Diabetes mellitus without complication ([place])   . Excessive daytime sleepiness   . High cholesterol   . Hypertension   . Mitral regurgitation    mild by echo 03/2017  . OSA (obstructive sleep apnea) 10/18/2015   Mild OSA with AHI 12.5/hr on CPAP at 8cm H2O  . Shingles   . TIA (transient ischemic attack)    Social History   Social History  . Marital status: Legally Separated    Spouse name: N/A  . Number of children: N/A  . Years of education: N/A   Occupational History  . Not on file.   Social History Main Topics  . Smoking status: Never Smoker  . Smokeless tobacco: Never Used  . Alcohol use No  . Drug use: No  . Sexual activity: Not on file   Other Topics Concern  . Not on file   Social History Narrative  . No narrative on file   Review of Systems  Constitutional: Negative for fatigue, malaise/fatigue and weight loss.  HENT: Positive for voice change.   Eyes: Negative.  Negative for blurred vision.  Respiratory: Negative for apnea and shortness of breath.   Cardiovascular: Positive for leg swelling. Negative for chest pain, palpitations, orthopnea and PND.  Gastrointestinal: Negative.   Endocrine: Negative for polydipsia, polyphagia and polyuria.  Genitourinary: Negative for impotence.  Musculoskeletal: Positive for myalgias.       Leg cramps  Skin: Negative.   Neurological: Negative for weakness.  Hematological: Negative.   Psychiatric/Behavioral: Negative for depression and sleep disturbance. The patient has insomnia.        Objective:   Physical Exam  Constitutional: She is oriented to  person, place, and time. She appears well-developed and well-nourished.  HENT:  Head: Normocephalic and atraumatic.  Right Ear: External ear normal.  Left Ear: External ear normal.  Nose: Nose normal.  Mouth/Throat: Oropharynx is clear and moist. Abnormal dentition.  Eyes: Pupils are equal, round, and  reactive to light. Conjunctivae and EOM are normal.  Neck: Normal range of motion. Neck supple.  Cardiovascular: Normal rate, regular rhythm, normal heart sounds and intact distal pulses.   Pulmonary/Chest: Effort normal and breath sounds normal.  Abdominal: Soft. Bowel sounds are normal.  Musculoskeletal: Normal range of motion.  Neurological: She is alert and oriented to person, place, and time. She has normal reflexes.  Skin: Skin is warm and dry.  Varicose veins to lower extremities   Psychiatric: She has a normal mood and affect. Her behavior is normal. Judgment and thought content normal.       BP 130/72 Comment: manually  Pulse 93   Temp 98.5 F (36.9 C) (Oral)   Resp 16   Ht 5\' 3"  (1.6 m)   Wt 171 lb (77.6 kg)   SpO2 98%   BMI 30.29 kg/m  Assessment & Plan:  1. Diabetes mellitus without complication ([place]) Hemoglobin a1C has decreased to 6.0, will decrease Januvia to 50 mg per day.  - COMPLETE METABOLIC PANEL WITH GFR - HgB A1c - CBC with Differential  2. Essential hypertension Blood pressure is at goal on The patient is asked to make an attempt to improve diet and exercise patterns to aid in medical management of this problem. - COMPLETE METABOLIC PANEL WITH GFR - CBC with Differential  3. Bilateral leg cramps - Compression stockings  4. Pain and swelling of lower extremity, unspecified laterality Recommend that patient continue following with pain management as scheduled.  Apply compression stockings daily Elevate lower extremities to heart level while at rest - Compression stockings   RTC: 3 months for chronic conditions   Donia Pounds  MSN, FNP-C [facility] [address] [place], [place] [address] [phone]

## 2017-06-29 LAB — MICROALBUMIN / CREATININE URINE RATIO
CREATININE, URINE: 76 mg/dL (ref 20–320)
Microalb Creat Ratio: 11 mcg/mg creat (ref <30)
Microalb, Ur: 0.8 mg/dL

## 2017-07-09 ENCOUNTER — Ambulatory Visit: Payer: Self-pay | Admitting: Family Medicine

## 2017-07-18 ENCOUNTER — Ambulatory Visit (INDEPENDENT_AMBULATORY_CARE_PROVIDER_SITE_OTHER): Payer: Medicare Other | Admitting: Cardiology

## 2017-07-18 ENCOUNTER — Encounter: Payer: Self-pay | Admitting: Cardiology

## 2017-07-18 VITALS — BP 140/68 | HR 100 | Ht 63.0 in | Wt 173.2 lb

## 2017-07-18 DIAGNOSIS — I10 Essential (primary) hypertension: Secondary | ICD-10-CM

## 2017-07-18 DIAGNOSIS — G4733 Obstructive sleep apnea (adult) (pediatric): Secondary | ICD-10-CM | POA: Diagnosis not present

## 2017-07-18 NOTE — Progress Notes (Signed)
Cardiology Office Note:    Date:  07/18/2017   ID:  Beth Hubbard, Beth Hubbard [birth year], MRN [id number]  PCP:  [name], FNP  Cardiologist:  Fransico Him, MD   Referring MD: [name], FNP   Chief Complaint  Patient presents with  . Sleep Apnea  . Hypertension    History of Present Illness:    Beth Hubbard is a 71 y.o. female with a hx of OSA on CPAP and HTN.  She is here today for followup per insurance requirements because she needed to have a face to fact within the 31st and the 90th day after starting a new machine.  I saw her back in June 2018 and she was doing well with her CPAP but because she had not been compliant with her device she lost it in march and had to be restarted on it which did not occur until the end of May.  She has mild OSA with an AHI of 11.9/hr with oxygen desaturations as low as 83% and is on CPAP at7cm H2O.   She continues to do well with her CPAP device.  She continues to  Tolerate tthe nasal pillow mask and feels the pressure is adequate.  She continues to feel rested in the am and has less daytime sleepiness.  She rarely will take a nap.  She has some dry mouth and nasal dryness but it is not very bothersome.     Past Medical History:  Diagnosis Date  . Diabetes mellitus without complication ([place])   . Excessive daytime sleepiness   . High cholesterol   . Hypertension   . Mitral regurgitation    mild by echo 03/2017  . OSA (obstructive sleep apnea) 10/18/2015   Mild OSA with AHI 12.5/hr on CPAP at 8cm H2O  . Shingles   . TIA (transient ischemic attack)     Past Surgical History:  Procedure Laterality Date  . ABDOMINAL SURGERY    . ROTATOR CUFF REPAIR     bilateral  . TEE WITHOUT CARDIOVERSION N/A 07/20/2016   Procedure: TRANSESOPHAGEAL ECHOCARDIOGRAM (TEE);  Surgeon: Pixie Casino, MD;  Location: [facility] ENDOSCOPY;  Service: Cardiovascular;  Laterality: N/A;    Current Medications: No outpatient prescriptions have been marked as  taking for the 07/18/17 encounter (Office Visit) with Sueanne Margarita, MD.     Allergies:   Aspirin; Penicillins; and Nsaids   Social History   Social History  . Marital status: Legally Separated    Spouse name: N/A  . Number of children: N/A  . Years of education: N/A   Social History Main Topics  . Smoking status: Never Smoker  . Smokeless tobacco: Never Used  . Alcohol use No  . Drug use: No  . Sexual activity: Not on file   Other Topics Concern  . Not on file   Social History Narrative  . No narrative on file     Family History: The patient's family history includes Cancer in her brother, father, and sister.  ROS:   Please see the history of present illness.     All other systems reviewed and are negative.  EKGs/Labs/Other Studies Reviewed:    The following studies were reviewed today: CPAP download  EKG:  EKG is not ordered today.    Recent Labs: 06/28/2017: ALT 29; BUN 10; Creat 0.87; Hemoglobin 11.2; Platelets 242; Potassium 3.7; Sodium 140   Recent Lipid Panel    Component Value Date/Time   CHOL 192 04/08/2017 0909  TRIG 136 04/08/2017 0909   HDL 72 04/08/2017 0909   CHOLHDL 2.7 04/08/2017 0909   VLDL 27 04/08/2017 0909   LDLCALC 93 04/08/2017 0909    Physical Exam:    VS:  There were no vitals taken for this visit.    Wt Readings from Last 3 Encounters:  06/28/17 171 lb (77.6 kg)  05/07/17 168 lb 1.9 oz (76.3 kg)  04/17/17 170 lb 12.8 oz (77.5 kg)     GEN:  Well nourished, well developed in no acute distress HEENT: Normal NECK: No JVD; No carotid bruits LYMPHATICS: No lymphadenopathy CARDIAC: RRR, no murmurs, rubs, gallops RESPIRATORY:  Clear to auscultation without rales, wheezing or rhonchi  ABDOMEN: Soft, non-tender, non-distended MUSCULOSKELETAL:  No edema; No deformity  SKIN: Warm and dry NEUROLOGIC:  Alert and oriented x 3 PSYCHIATRIC:  Normal affect   ASSESSMENT:    1. OSA (obstructive sleep apnea)   2. Essential  hypertension    PLAN:    In order of problems listed above:  OSA - the patient is tolerating PAP therapy well without any problems. The PAP download was reviewed today and showed an AHI of 1.3/hr on 10 cm H2O with 86% compliance in using more than 4 hours nightly.  The patient has been using and benefiting from CPAP use and will continue to benefit from therapy.  HTN - her BP is well controlled on exam today.  She will continue on amlodipine 5mg  daily.    Medication Adjustments/Labs and Tests Ordered: Current medicines are reviewed at length with the patient today.  Concerns regarding medicines are outlined above.  No orders of the defined types were placed in this encounter.  No orders of the defined types were placed in this encounter.   Signed, Fransico Him, MD  07/18/2017 2:18 PM    Beth Hubbard

## 2017-07-18 NOTE — Patient Instructions (Signed)

## 2017-07-24 ENCOUNTER — Telehealth: Payer: Self-pay | Admitting: Cardiology

## 2017-07-24 ENCOUNTER — Other Ambulatory Visit: Payer: Self-pay | Admitting: Family Medicine

## 2017-07-24 DIAGNOSIS — G4700 Insomnia, unspecified: Secondary | ICD-10-CM

## 2017-07-24 NOTE — Telephone Encounter (Signed)
[facility] called the patient and said return the machine or start paying $94 month for the rental of the machine. Patient's daughter states [facility] sent her a email stating start paying $94 or return the machine. Patient's daughter said she returned the machine today.  [facility] sent a fax today DISCONTINUING THERAPY AGAINST MEDICAL ADVICE for the patient.

## 2017-07-24 NOTE — Telephone Encounter (Signed)
°  New Message   pt verbalized that she is returning call for rn about financing the breathing machine

## 2017-07-24 NOTE — Telephone Encounter (Signed)
[facility] needs a Dr's note to continue to provide patient's C-PAP machine supplies.  Please call patient or her daughter in regard to note.

## 2017-07-25 NOTE — Telephone Encounter (Signed)
Please find out from [place] why this happened

## 2017-07-25 NOTE — Telephone Encounter (Signed)
Fara Olden in the compliance department says the whole situation has been confusing for the patient.  Fara Olden states they have been asking the patient for weeks to give them her appointment date for a recent face to face visit but the patient kept telling them she could not get in touch with our office to get an appointment when in fact she was scheduled for a follow up face to face appointment on 07/18/17 which would have been all they needed to allow her to keep her machine.

## 2017-09-04 ENCOUNTER — Other Ambulatory Visit: Payer: Self-pay | Admitting: Family Medicine

## 2017-09-04 DIAGNOSIS — E785 Hyperlipidemia, unspecified: Secondary | ICD-10-CM

## 2017-09-11 NOTE — Telephone Encounter (Signed)
Patients daughter called stating her mom is having issues and wants her CPAP back again. It has been a couple of months and she has to start over with a sleep study Per [facility]. Please advise,

## 2017-09-18 ENCOUNTER — Telehealth: Payer: Self-pay | Admitting: *Deleted

## 2017-09-18 DIAGNOSIS — G4733 Obstructive sleep apnea (adult) (pediatric): Secondary | ICD-10-CM

## 2017-09-18 NOTE — Telephone Encounter (Signed)
-----   Message from Sueanne Margarita, MD sent at 09/11/2017  4:55 PM EDT ----- Regarding: RE: CPAP OK repeat CPAP titration  Traci ----- Message ----- From: Freada Bergeron, CMA Sent: 09/11/2017   3:40 PM To: Sueanne Margarita, MD Subject: CPAP                                           Patients daughter called stating her mom is having issues and wants her CPAP back again. It has been a couple of months and she has to start over with a sleep study Per [facility]. Please advise, Thanks  [facility] called the patient and said return the machine or start paying $94 month for the rental of the machine. Patient's daughter states [facility] sent her a email stating start paying $94 or return the machine. Patient's daughter said she returned the machine today.  [facility] sent a fax today DISCONTINUING THERAPY AGAINST MEDICAL ADVICE for the patient.

## 2017-09-19 ENCOUNTER — Other Ambulatory Visit: Payer: Self-pay

## 2017-09-19 DIAGNOSIS — E785 Hyperlipidemia, unspecified: Secondary | ICD-10-CM

## 2017-09-19 MED ORDER — ROSUVASTATIN CALCIUM 20 MG PO TABS: 20.0000 mg | ORAL_TABLET | Freq: Every day | ORAL | 5 refills | Status: DC

## 2017-09-27 ENCOUNTER — Encounter: Payer: Self-pay | Admitting: *Deleted

## 2017-09-27 NOTE — Telephone Encounter (Signed)
Informed patient of titration study and verbalized understanding was indicated. Patient understands her Titration study is scheduled for Friday November 01 2017. Patient understands her titration study will be done at [facility] sleep lab. Patient understands she will receive a sleep packet in a week or so. Patient understands to call if she does not receive the sleep packet in a timely manner. Patient agrees with treatment and thanked me for call

## 2017-09-30 ENCOUNTER — Ambulatory Visit: Payer: Self-pay | Admitting: Family Medicine

## 2017-10-02 ENCOUNTER — Ambulatory Visit (INDEPENDENT_AMBULATORY_CARE_PROVIDER_SITE_OTHER): Payer: Medicare Other | Admitting: Family Medicine

## 2017-10-02 ENCOUNTER — Encounter: Payer: Self-pay | Admitting: Family Medicine

## 2017-10-02 VITALS — BP 138/72 | HR 110 | Temp 98.7°F | Resp 16 | Wt 173.0 lb

## 2017-10-02 DIAGNOSIS — E119 Type 2 diabetes mellitus without complications: Secondary | ICD-10-CM

## 2017-10-02 DIAGNOSIS — G4700 Insomnia, unspecified: Secondary | ICD-10-CM

## 2017-10-02 DIAGNOSIS — I10 Essential (primary) hypertension: Secondary | ICD-10-CM

## 2017-10-02 LAB — POCT URINALYSIS DIP (DEVICE)
BILIRUBIN URINE: NEGATIVE
GLUCOSE, UA: NEGATIVE mg/dL
KETONES UR: NEGATIVE mg/dL
LEUKOCYTES UA: NEGATIVE
Nitrite: NEGATIVE
Protein, ur: NEGATIVE mg/dL
Specific Gravity, Urine: <=1.005 (ref 1.005–1.030)
Urobilinogen, UA: 0.2 mg/dL (ref 0.0–1.0)
pH: 6.5 (ref 5.0–8.0)

## 2017-10-02 LAB — COMPLETE METABOLIC PANEL WITH GFR
AG Ratio: 1.5 (calc) (ref 1.0–2.5)
ALKALINE PHOSPHATASE (APISO): 46 U/L (ref 33–130)
ALT: 21 U/L (ref 6–29)
AST: 25 U/L (ref 10–35)
Albumin: 4.4 g/dL (ref 3.6–5.1)
BILIRUBIN TOTAL: 0.3 mg/dL (ref 0.2–1.2)
BUN: 8 mg/dL (ref 7–25)
CALCIUM: 9.2 mg/dL (ref 8.6–10.4)
CHLORIDE: 101 mmol/L (ref 98–110)
CO2: 32 mmol/L (ref 20–32)
Creat: 0.7 mg/dL (ref 0.60–0.93)
GFR, Est African American: 100 mL/min/{1.73_m2} (ref >=60)
GFR, Est Non African American: 87 mL/min/{1.73_m2} (ref >=60)
Globulin: 2.9 g/dL (calc) (ref 1.9–3.7)
Glucose, Bld: 126 mg/dL — ABNORMAL HIGH (ref 65–99)
Potassium: 3.5 mmol/L (ref 3.5–5.3)
Sodium: 142 mmol/L (ref 135–146)
TOTAL PROTEIN: 7.3 g/dL (ref 6.1–8.1)

## 2017-10-02 LAB — POCT GLYCOSYLATED HEMOGLOBIN (HGB A1C): Hemoglobin A1C: 6.7

## 2017-10-02 MED ORDER — TRAZODONE HCL 50 MG PO TABS: 50.0000 mg | ORAL_TABLET | Freq: Every evening | ORAL | 2 refills | Status: DC | PRN

## 2017-10-02 NOTE — Progress Notes (Signed)
Subjective:    Patient ID: Beth Hubbard, female    DOB: [birth year], 71 y.o.   MRN: [id number]  Beth Hubbard, a 71 year old female presents accompanied by daughter for a 43-month follow-up of chronic conditions.  She states that she is taking all medications consistently.   Hypertension  This is a chronic problem. The problem is controlled. Pertinent negatives include no anxiety, blurred vision, chest pain, malaise/fatigue, orthopnea, palpitations, peripheral edema, PND, shortness of breath or sweats. Risk factors for coronary artery disease include diabetes mellitus and obesity. The current treatment provides moderate improvement. Compliance problems include diet.  There is no history of CVA or retinopathy. There is no history of chronic renal disease.  Diabetes  She presents for her follow-up diabetic visit. She has type 2 diabetes mellitus. Her disease course has been stable. Pertinent negatives for hypoglycemia include no sweats. Pertinent negatives for diabetes include no blurred vision, no chest pain, no fatigue, no foot paresthesias, no foot ulcerations, no polydipsia, no polyphagia, no polyuria, no visual change, no weakness and no weight loss. Pertinent negatives for diabetic complications include no autonomic neuropathy, CVA, heart disease, impotence, nephropathy or retinopathy. There are no known risk factors for coronary artery disease. Current diabetic treatment includes oral agent (dual therapy). She is compliant with treatment all of the time. She is following a high fat/cholesterol diet.  Insomnia  Primary symptoms: no fragmented sleep, no sleep disturbance, no difficulty falling asleep, no somnolence, no frequent awakening, no premature morning awakening, no malaise/fatigue.  The current episode started in the past 7 days. The onset quality is undetermined. The problem occurs rarely. How many beverages per day that contain caffeine: 0 - 1.  Typical bedtime:  Other.  PMH includes:  associated symptoms present, no hypertension, no depression, no family stress or anxiety, no restless leg syndrome, no work related stressors, no chronic pain, no apnea.     Past Medical History:  Diagnosis Date  . Diabetes mellitus without complication ([place])   . Excessive daytime sleepiness   . High cholesterol   . Hypertension   . Mitral regurgitation    mild by echo 03/2017  . OSA (obstructive sleep apnea) 10/18/2015   Mild OSA with AHI 12.5/hr on CPAP at 8cm H2O  . Shingles   . TIA (transient ischemic attack)    Social History   Socioeconomic History  . Marital status: Legally Separated    Spouse name: Not on file  . Number of children: Not on file  . Years of education: Not on file  . Highest education level: Not on file  Social Needs  . Financial resource strain: Not on file  . Food insecurity - worry: Not on file  . Food insecurity - inability: Not on file  . Transportation needs - medical: Not on file  . Transportation needs - non-medical: Not on file  Occupational History  . Not on file  Tobacco Use  . Smoking status: Never Smoker  . Smokeless tobacco: Never Used  Substance and Sexual Activity  . Alcohol use: No  . Drug use: No  . Sexual activity: Not on file  Other Topics Concern  . Not on file  Social History Narrative  . Not on file   Review of Systems  Constitutional: Negative for fatigue, malaise/fatigue and weight loss.  Eyes: Negative.  Negative for blurred vision.  Respiratory: Negative for apnea and shortness of breath.   Cardiovascular: Negative for chest pain, palpitations, orthopnea and PND.  Gastrointestinal:  Negative.   Endocrine: Negative for polydipsia, polyphagia and polyuria.  Genitourinary: Negative for impotence.  Musculoskeletal: Negative.   Skin: Negative.   Neurological: Negative for weakness.  Hematological: Negative.   Psychiatric/Behavioral: Negative for depression and sleep disturbance. The patient has insomnia.         Objective:   Physical Exam  Constitutional: She is oriented to person, place, and time. She appears well-developed and well-nourished.  HENT:  Head: Normocephalic and atraumatic.  Right Ear: External ear normal.  Left Ear: External ear normal.  Nose: Nose normal.  Mouth/Throat: Oropharynx is clear and moist. Abnormal dentition.  Eyes: Conjunctivae and EOM are normal. Pupils are equal, round, and reactive to light.  Neck: Normal range of motion. Neck supple.  Cardiovascular: Normal rate, regular rhythm, normal heart sounds and intact distal pulses.  Pulmonary/Chest: Effort normal and breath sounds normal.  Abdominal: Soft. Bowel sounds are normal.  Musculoskeletal: Normal range of motion.  Neurological: She is alert and oriented to person, place, and time. She has normal reflexes.  Skin: Skin is warm and dry.  Psychiatric: She has a normal mood and affect. Her behavior is normal. Judgment and thought content normal.          BP 138/72 (BP Location: Left Arm, Patient Position: Sitting, Cuff Size: Large) Comment: manually  Pulse (!) 110   Temp 98.7 F (37.1 C) (Oral)   Resp 16   Wt 173 lb (78.5 kg)   SpO2 99%   BMI 30.65 kg/m  Assessment & Plan:  1. Diabetes mellitus without complication ([facility]) Hemoglobin A1c is 6.7, which is at goal.  Will continue Januvia 50 mg daily with breakfast.  Also recommend a carbohydrate modified diet divided over 5-6 small meals throughout the day.  Reviewed urinalysis no glucosuria present. - HgB A1c - COMPLETE METABOLIC PANEL WITH GFR - POCT urinalysis dip (device)  2. Essential hypertension Blood pressure is at goal on current medications, no changes warranted on today.  Reviewed urinalysis no proteinuria present.  Will review renal functioning as results become available. - COMPLETE METABOLIC PANEL WITH GFR - POCT urinalysis dip (device)  3. Insomnia, unspecified type Insomnia controlled on trazodone, patient is requesting a refill on  medication. - traZODone (DESYREL) 50 MG tablet; Take 1 tablet (50 mg total) at bedtime as needed by mouth for sleep.  Dispense: 60 tablet; Refill: 2   RTC: 3 months for chronic conditions  Donia Pounds  MSN, FNP-C [facility] [address] [place], [place] [address] [phone]

## 2017-10-02 NOTE — Patient Instructions (Addendum)
  Your hemoglobin A1c is 6.7, which is at goal.  No medication changes warranted on today.  Recommend that you continue a carbohydrate modified over 5-6 small meals throughout the day.     Continues to complain of insomnia, will increase trazodone to 50 mg at bedtime as needed.  Refrain from drinking, driving, or operating machinery while taking this medication. Practice good sleep hygiene.  Stick to a sleep schedule, even on weekends. Exercise is great, but not too late in the day Avoid alcoholic drinks before bed Avoid large meals and beverages late before bed Don't take naps after 3 pm. Keep power naps less than 1 hour.  Relax before bed.  Take a hot bath before bed.  Have a good sleeping environment. Get rid of anything in your bedroom that might distract you from sleep.  Adopt good sleeping posture.    We will follow-up by phone with any abnormal lab results.

## 2017-10-23 ENCOUNTER — Encounter: Payer: Self-pay | Admitting: Family Medicine

## 2017-10-23 ENCOUNTER — Ambulatory Visit (INDEPENDENT_AMBULATORY_CARE_PROVIDER_SITE_OTHER): Payer: Medicare Other | Admitting: Family Medicine

## 2017-10-23 VITALS — BP 134/68 | HR 88 | Temp 98.7°F | Resp 16 | Ht 63.0 in | Wt 173.0 lb

## 2017-10-23 DIAGNOSIS — W19XXXA Unspecified fall, initial encounter: Secondary | ICD-10-CM

## 2017-10-23 DIAGNOSIS — Y92009 Unspecified place in unspecified non-institutional (private) residence as the place of occurrence of the external cause: Secondary | ICD-10-CM

## 2017-10-23 DIAGNOSIS — M79604 Pain in right leg: Secondary | ICD-10-CM | POA: Diagnosis not present

## 2017-10-23 DIAGNOSIS — M542 Cervicalgia: Secondary | ICD-10-CM

## 2017-10-23 DIAGNOSIS — R221 Localized swelling, mass and lump, neck: Secondary | ICD-10-CM | POA: Diagnosis not present

## 2017-10-23 MED ORDER — PREDNISONE 20 MG PO TABS: 20.0000 mg | ORAL_TABLET | Freq: Every day | ORAL | 0 refills | Status: AC

## 2017-10-23 NOTE — Progress Notes (Signed)
Beth Hubbard, a 71 year old female presents accompanied by daughter complaining of a fall.  Patient sustained a fall on Saturday, 10/19/2017. Beth Hubbard states that she was visiting her son, who has a very "slippery tub". She says that she slipped while getting out of the shower. She attempted to stop fall with her right hand. She says that she fell on tile floor hitting right side, including right shoulder and right knee. Patient is followed by pain management for chronic pain. She says that she has been taking Vicodin 5-325 mg and Cyclobenzaprine 10 mg every 8 hours as directed without sustained relief. Current pain intensity is 8/10 described as sore and aching.   Past Medical History:  Diagnosis Date  . Diabetes mellitus without complication ([place])   . Excessive daytime sleepiness   . High cholesterol   . Hypertension   . Mitral regurgitation    mild by echo 03/2017  . OSA (obstructive sleep apnea) 10/18/2015   Mild OSA with AHI 12.5/hr on CPAP at 8cm H2O  . Shingles   . TIA (transient ischemic attack)    Social History   Socioeconomic History  . Marital status: Legally Separated    Spouse name: Not on file  . Number of children: Not on file  . Years of education: Not on file  . Highest education level: Not on file  Social Needs  . Financial resource strain: Not on file  . Food insecurity - worry: Not on file  . Food insecurity - inability: Not on file  . Transportation needs - medical: Not on file  . Transportation needs - non-medical: Not on file  Occupational History  . Not on file  Tobacco Use  . Smoking status: Never Smoker  . Smokeless tobacco: Never Used  Substance and Sexual Activity  . Alcohol use: No  . Drug use: No  . Sexual activity: Not on file  Other Topics Concern  . Not on file  Social History Narrative  . Not on file   Immunization History  Administered Date(s) Administered  . Influenza-Unspecified 06/19/2013  . Pneumococcal Conjugate-13 02/24/2015  .  Pneumococcal Polysaccharide-23 03/05/2016  . Tdap 02/24/2015  Review of Systems  Constitutional: Negative.   HENT: Negative.   Eyes: Negative for redness and visual disturbance.  Respiratory: Negative.   Cardiovascular: Negative.  Negative for chest pain, palpitations and leg swelling.  Gastrointestinal: Negative.   Endocrine: Negative.  Negative for polydipsia, polyphagia and polyuria.  Genitourinary: Negative.   Musculoskeletal: Positive for arthralgias, joint swelling and neck pain.  Skin: Negative.   Allergic/Immunologic: Negative.   Neurological: Negative.   Hematological: Negative.   Psychiatric/Behavioral: Negative.   Physical Exam  Eyes: Conjunctivae and EOM are normal. Pupils are equal, round, and reactive to light.  Neck: Neck supple. Muscular tenderness present. Edema, erythema and decreased range of motion present.  Cardiovascular: Normal rate and regular rhythm.  Pulmonary/Chest: Effort normal and breath sounds normal.  Abdominal: Soft. Bowel sounds are normal.  Musculoskeletal:       Right shoulder: She exhibits decreased range of motion, tenderness and swelling.       Right knee: She exhibits decreased range of motion and swelling.       Legs: Skin: Skin is warm and dry.  Psychiatric: She has a normal mood and affect. Her behavior is normal. Judgment and thought content normal.    Plan  1. Cervicalgia We will start a trial of prednisone 20 mg by mouth daily for 5 days.  Patient  is unable to take nonsteroidal anti-inflammatories due to a history of allergies to aspirin and ibuprofen.  Recommend the patient apply warm moist compresses to affected areas 20 minutes 4 times a day, followed by cool compresses. - predniSONE (DELTASONE) 20 MG tablet; Take 1 tablet (20 mg total) by mouth daily with breakfast for 5 days.  Dispense: 5 tablet; Refill: 0  2. Right leg pain Recommend that patient elevate right lower extremity to heart level while at rest  3. Fall in home,  initial encounter Patient sustained a fall while exiting shower on 10/19/2017.  Fall was to right side. 4. Neck swelling - predniSONE (DELTASONE) 20 MG tablet; Take 1 tablet (20 mg total) by mouth daily with breakfast for 5 days.  Dispense: 5 tablet; Refill: 0   RTC: 4 weeks for cervicalgia   Donia Pounds  MSN, FNP-C [facility] [address] [place], [place] [address] [phone]

## 2017-10-23 NOTE — Patient Instructions (Addendum)
Patient sustained a fall this past Saturday resulting in widespread pain and cervicalgia.  We will start a trial of prednisone 20 mg for 5 days to decrease edema.  Refrain from taking over-the-counter nonsteroidal anti-inflammatory medications due to allergies and history of hypertension and type 2 diabetes.  Apply warm moist compresses to affected areas for 20 minutes 4 times a day, follow-up by cool compresses for 20 minutes.  Elevate right lower extremity to heart level while at rest.  If symptoms worsen please report to clinic or the emergency department for further workup and evaluation.  We will follow-up in office in 4 weeks.    Heat Therapy Heat therapy can help ease sore, stiff, injured, and tight muscles and joints. Heat relaxes your muscles, which may help ease your pain. Heat therapy should only be used on old, pre-existing, or long-lasting (chronic) injuries. Do not use heat therapy unless told by your doctor. How to use heat therapy There are several different kinds of heat therapy, including:  Moist heat pack.  Warm water bath.  Hot water bottle.  Electric heating pad.  Heated gel pack.  Heated wrap.  Electric heating pad.  General heat therapy recommendations  Do not sleep while using heat therapy. Only use heat therapy while you are awake.  Your skin may turn pink while using heat therapy. Do not use heat therapy if your skin turns red.  Do not use heat therapy if you have new pain.  High heat or long exposure to heat can cause burns. Be careful when using heat therapy to avoid burning your skin.  Do not use heat therapy on areas of your skin that are already irritated, such as with a rash or sunburn. Get help if:  You have blisters, redness, swelling (puffiness), or numbness.  You have new pain.  Your pain is worse. This information is not intended to replace advice given to you by your health care provider. Make sure you discuss any questions you have with  your health care provider. Document Released: 01/28/2012 Document Revised: 04/12/2016 Document Reviewed: 12/29/2013 Elsevier Interactive Patient Education  2018 [employer].  Musculoskeletal Pain Musculoskeletal pain is muscle and bone aches and pains. This pain can occur in any part of the body. Follow these instructions at home:  Only take medicines for pain, discomfort, or fever as told by your health care provider.  You may continue all activities unless the activities cause more pain. When the pain lessens, slowly resume normal activities. Gradually increase the intensity and duration of the activities or exercise.  During periods of severe pain, bed rest may be helpful. Lie or sit in any position that is comfortable, but get out of bed and walk around at least every several hours.  If directed, put ice on the injured area. ? Put ice in a plastic bag. ? Place a towel between your skin and the bag. ? Leave the ice on for 20 minutes, 2-3 times a day. Contact a health care provider if:  Your pain is getting worse.  Your pain is not relieved with medicines.  You lose function in the area of the pain if the pain is in your arms, legs, or neck. This information is not intended to replace advice given to you by your health care provider. Make sure you discuss any questions you have with your health care provider. Document Released: 11/05/2005 Document Revised: 04/17/2016 Document Reviewed: 07/10/2013 Elsevier Interactive Patient Education  2017 [employer].

## 2017-11-01 ENCOUNTER — Ambulatory Visit (HOSPITAL_BASED_OUTPATIENT_CLINIC_OR_DEPARTMENT_OTHER): Payer: Medicare Other | Attending: Cardiology | Admitting: Cardiology

## 2017-11-01 DIAGNOSIS — G4733 Obstructive sleep apnea (adult) (pediatric): Secondary | ICD-10-CM | POA: Diagnosis not present

## 2017-11-06 NOTE — Procedures (Signed)
   NAME: Beth Hubbard DATE OF BIRTH:  January 26, 1946 MEDICAL RECORD NUMBER [id number]  LOCATION: [facility]  PHYSICIAN: Lynora Dymond  DATE OF STUDY: 11/01/2017  SLEEP STUDY TYPE: Positive Airway Pressure Titration               REFERRING PHYSICIAN: Sueanne Margarita, MD HEIGHT: 5\' 3"  (160 cm)  WEIGHT: 173 lb (78.5 kg)    Body mass index is 30.65 kg/m.  NECK SIZE: 14 in.  CLINICAL INFORMATION The patient is referred for a CPAP titration to treat sleep apnea.  SLEEP STUDY TECHNIQUE As per the AASM Manual for the Scoring of Sleep and Associated Events v2.3 (April 2016) with a hypopnea requiring 4% desaturations.  The channels recorded and monitored were frontal, central and occipital EEG, electrooculogram (EOG), submentalis EMG (chin), nasal and oral airflow, thoracic and abdominal wall motion, anterior tibialis EMG, snore microphone, electrocardiogram, and pulse oximetry. Continuous positive airway pressure (CPAP) was initiated at the beginning of the study and titrated to treat sleep-disordered breathing.  MEDICATIONS Medications self-administered by patient taken the night of the study : NEURONTIN, [place], CLINORIL, DESYREL  TECHNICIAN COMMENTS Comments added by technician: NONE Comments added by scorer: N/A  RESPIRATORY PARAMETERS Optimal PAP Pressure (cm): 6  AHI at Optimal Pressure (/hr):1.6 Overall Minimal O2 (%):90.00  Supine % at Optimal Pressure (%):N/A Minimal O2 at Optimal Pressure (%): 90.00   SLEEP ARCHITECTURE The study was initiated at 9:19:20 PM and ended at 4:04:48 AM.  Sleep onset time was 14.3 minutes and the sleep efficiency was 76.0%. The total sleep time was 308.0 minutes.  The patient spent 3.08% of the night in stage N1 sleep, 86.85% in stage N2 sleep, 0.00% in stage N3 and 10.06% in REM.Stage REM latency was 53.0 minutes  Wake after sleep onset was 83.2. Alpha intrusion was absent. Supine sleep was 0.00%.  CARDIAC DATA The 2 lead  EKG demonstrated sinus rhythm. The mean heart rate was 89.25 beats per minute. Other EKG findings include: None.  LEG MOVEMENT DATA The total Periodic Limb Movements of Sleep (PLMS) were 0. The PLMS index was 0.00. A PLMS index of <15 is considered normal in adults.  IMPRESSIONS - An optimal PAP pressure could not be selected for this patient based on the available study data. - Central sleep apnea was not noted during this titration (CAI = 0.2/h). - Significant oxygen desaturations were not observed during this titration (min O2 = 90.00%). - The patient snored with soft snoring volume during this titration study. - No cardiac abnormalities were observed during this study. - Clinically significant periodic limb movements were not noted during this study. Arousals associated with PLMs were rare.  DIAGNOSIS - Obstructive Sleep Apnea (327.23 [G47.33 ICD-10])  RECOMMENDATIONS - Recommend CPAP at 6cm H2O with heated humidity and mask of choice - Avoid alcohol, sedatives and other CNS depressants that may worsen sleep apnea and disrupt normal sleep architecture. - Sleep hygiene should be reviewed to assess factors that may improve sleep quality. - Weight management and regular exercise should be initiated or continued. - Followup in sleep clinic in 10 weeks  [place], American Board of Sleep Medicine  ELECTRONICALLY SIGNED ON:  11/06/2017, 10:28 PM [place] PH: [phone]   FX: [phone] [place]

## 2017-11-13 ENCOUNTER — Telehealth: Payer: Self-pay | Admitting: *Deleted

## 2017-11-13 NOTE — Telephone Encounter (Signed)
-----   Message from Sueanne Margarita, MD sent at 11/06/2017 10:32 PM EST ----- Please let patient know that they had a successful PAP titration and let DME know that orders are in EPIC.  Please set up 10 week OV with me.

## 2017-11-13 NOTE — Telephone Encounter (Signed)
Informed patient/daughter (DPR) of titration results and verbalized understanding was indicated. Patient understands her CPAP titration was successful and Dr Radford Pax has ordered her a CPAP. Patient understands she will be contacted by [facility] to set up her cpap. She understands to call if [facility] does not contact her with new setup in a timely manner. She understands she will be called once confirmation has been received from [facility] that she has received her new machine to schedule 10 week follow up appointment.  [facility] notified of new cpap order in epic Please add to Beth Hubbard She was grateful for the call and thanked me

## 2017-12-02 NOTE — Progress Notes (Signed)
Cardiology Office Note:    Date:  12/03/2017   ID:  [name], [name] [birth year], MRN [id number]  PCP:  [name], FNP  Cardiologist:  No primary care provider on file.    Referring MD: [name], FNP   Chief Complaint  Patient presents with  . Sleep Apnea    History of Present Illness:    Beth Hubbard is a 72 y.o. female with a hx of OSA on CPAPand HTN. I saw her back for followup 07/18/2017 per insurance requirements because she needed to have a face to face within the 31st and the 90th day after starting a new machine. Prior to that she had not been compliant with her device so she lost it in march and had to be restarted on it which did not occur until the end of May.  She has mild OSA with an AHI of 11.9/hr with oxygen desaturations as low as 83% and is on CPAP at7cm H2O. In August she was doing well with her device but unfortunately she never relayed a message to [facility] that she was seen in our office and so insurance was not notified and called and told her to turn her machine back in.    Her daughter then called stating that her mom needs the CPAP so we now have to start all over again.  Per insurance requirements a repeat CPAP titration was required again.  She was started on 6cm H2O and she is now here for followup.  Apparently now in order to get her CPAP she has to be seen by Sleep MD again prior to getting her device.  She is having a lot of nighttime awakenings without her CPAP.  She wakes up gasping for air and has to sit up and also wakes up with body jerks.     Past Medical History:  Diagnosis Date  . Diabetes mellitus without complication ([place])   . Excessive daytime sleepiness   . High cholesterol   . Hypertension   . Mitral regurgitation    mild by echo 03/2017  . OSA (obstructive sleep apnea) 10/18/2015   Mild OSA with AHI 12.5/hr on CPAP at 8cm H2O  . Shingles   . TIA (transient ischemic attack)     Past Surgical History:  Procedure  Laterality Date  . ABDOMINAL SURGERY    . ROTATOR CUFF REPAIR     bilateral  . TEE WITHOUT CARDIOVERSION N/A 07/20/2016   Procedure: TRANSESOPHAGEAL ECHOCARDIOGRAM (TEE);  Surgeon: Pixie Casino, MD;  Location[facility] ENDOSCOPY;  Service: Cardiovascular;  Laterality: N/A;    Current Medications: Current Meds  Medication Sig  . ACCU-CHEK AVIVA PLUS test strip Reported on 02/24/2016  . ACCU-CHEK SOFTCLIX LANCETS lancets Check BS once or twice a day.  Marland Kitchen amLODipine (NORVASC) 5 MG tablet Take 1 tablet (5 mg total) by mouth daily.  . Blood Glucose Monitoring Suppl (ACCU-CHEK AVIVA PLUS) w/Device KIT 1 kit by Does not apply route as directed. Check 1 to 2 times daily  . Calcium Carbonate-Vitamin D (CALCIUM 600/VITAMIN D) 600-400 MG-UNIT chew tablet Chew 1 tablet by mouth daily.  . COMBIGAN 0.2-0.5 % ophthalmic solution Place 1 drop into the left eye 2 (two) times daily.  . cyclobenzaprine (FLEXERIL) 10 MG tablet Take 10 mg by mouth 3 (three) times daily as needed for muscle spasms.  Marland Kitchen dexamethasone (DECADRON) 4 MG tablet Take 4 mg by mouth 2 (two) times daily with a meal.  . ferrous sulfate 325 (  65 FE) MG tablet Take 325 mg by mouth as directed.  . folic acid (FOLVITE) 163 MCG tablet Take 400 mcg by mouth daily.  Marland Kitchen HYDROcodone-acetaminophen (NORCO/VICODIN) 5-325 MG tablet Take 1 tablet by mouth as directed.  . rosuvastatin (CRESTOR) 20 MG tablet Take 1 tablet (20 mg total) by mouth daily.  . sitaGLIPtin (JANUVIA) 50 MG tablet Take 1 tablet (50 mg total) by mouth daily.  . traZODone (DESYREL) 50 MG tablet Take 1 tablet (50 mg total) at bedtime as needed by mouth for sleep.  Marland Kitchen triamcinolone cream (KENALOG) 0.1 % Apply 1 application topically 2 (two) times daily.     Allergies:   Aspirin; Penicillins; and Nsaids   Social History   Socioeconomic History  . Marital status: Legally Separated    Spouse name: None  . Number of children: None  . Years of education: None  . Highest education level:  None  Social Needs  . Financial resource strain: None  . Food insecurity - worry: None  . Food insecurity - inability: None  . Transportation needs - medical: None  . Transportation needs - non-medical: None  Occupational History  . None  Tobacco Use  . Smoking status: Never Smoker  . Smokeless tobacco: Never Used  Substance and Sexual Activity  . Alcohol use: No  . Drug use: No  . Sexual activity: None  Other Topics Concern  . None  Social History Narrative  . None     Family History: The patient's family history includes Cancer in her brother, father, and sister.  ROS:   Please see the history of present illness.    Review of Systems  Respiratory: Positive for snoring.   Musculoskeletal: Positive for back pain, joint swelling and muscle cramps.  Neurological: Positive for loss of balance.    All other systems reviewed and negative.   EKGs/Labs/Other Studies Reviewed:    The following studies were reviewed today: none  EKG:  EKG is not ordered today.   Recent Labs: 06/28/2017: Hemoglobin 11.2; Platelets 242 10/02/2017: ALT 21; BUN 8; Creat 0.70; Potassium 3.5; Sodium 142   Recent Lipid Panel    Component Value Date/Time   CHOL 192 04/08/2017 0909   TRIG 136 04/08/2017 0909   HDL 72 04/08/2017 0909   CHOLHDL 2.7 04/08/2017 0909   VLDL 27 04/08/2017 0909   LDLCALC 93 04/08/2017 0909    Physical Exam:    VS:  BP 138/70   Pulse 94   Ht 5' 3"  (1.6 m)   Wt 172 lb 6.4 oz (78.2 kg)   SpO2 97%   BMI 30.54 kg/m     Wt Readings from Last 3 Encounters:  12/03/17 172 lb 6.4 oz (78.2 kg)  11/01/17 173 lb (78.5 kg)  10/23/17 173 lb (78.5 kg)     GEN:  Well nourished, well developed in no acute distress HEENT: Normal NECK: No JVD; No carotid bruits LYMPHATICS: No lymphadenopathy CARDIAC: RRR, no murmurs, rubs, gallops RESPIRATORY:  Scattered expiratory wheezes ABDOMEN: Soft, non-tender, non-distended MUSCULOSKELETAL:  No edema; No deformity  SKIN: Warm  and dry NEUROLOGIC:  Alert and oriented x 3 PSYCHIATRIC:  Normal affect   ASSESSMENT:    1. OSA (obstructive sleep apnea)   2. Essential hypertension    PLAN:    In order of problems listed above:  1.  OSA - she had to turn her machine back in and now is having worsening awakenings at night with gasping for breath and body jerks and needs  to get back on her CPAP.  I will order a new CPAP unit with heated humidity and mask of choice with CPAP set at 10cm H2O.  I will see her back in 10 weeks to document compliance.    2.  HTN - Bp is well controlled on exam today.  She will continue on amlodipine 61m daily   Medication Adjustments/Labs and Tests Ordered: Current medicines are reviewed at length with the patient today.  Concerns regarding medicines are outlined above.  No orders of the defined types were placed in this encounter.  No orders of the defined types were placed in this encounter.   Signed, TFransico Him MD  12/03/2017 8:21 AM    [place]

## 2017-12-03 ENCOUNTER — Encounter: Payer: Self-pay | Admitting: Cardiology

## 2017-12-03 ENCOUNTER — Ambulatory Visit (INDEPENDENT_AMBULATORY_CARE_PROVIDER_SITE_OTHER): Payer: Medicare Other | Admitting: Cardiology

## 2017-12-03 ENCOUNTER — Encounter (INDEPENDENT_AMBULATORY_CARE_PROVIDER_SITE_OTHER): Payer: Self-pay

## 2017-12-03 VITALS — BP 138/70 | HR 94 | Ht 63.0 in | Wt 172.4 lb

## 2017-12-03 DIAGNOSIS — I10 Essential (primary) hypertension: Secondary | ICD-10-CM | POA: Diagnosis not present

## 2017-12-03 DIAGNOSIS — G4733 Obstructive sleep apnea (adult) (pediatric): Secondary | ICD-10-CM | POA: Diagnosis not present

## 2017-12-03 NOTE — Patient Instructions (Signed)
Medication Instructions:  Your physician recommends that you continue on your current medications as directed. Please refer to the Current Medication list given to you today.  If you need a refill on your cardiac medications, please contact your pharmacy first.  Labwork: None ordered   Testing/Procedures: None ordered   Follow-Up: Your physician wants you to follow-up in: 10 weeks with Dr. Radford Pax. You will receive a reminder letter in the mail two months in advance. If you don't receive a letter, please call our office to schedule the follow-up appointment.  Any Other Special Instructions Will Be Listed Below (If Applicable).  Your order has been placed for a CPAP machine. If you do not receive a call by the end of next week give Gae Bon, CPAP assistant a call at [phone]    If you need a refill on your cardiac medications before your next appointment, please call your pharmacy.

## 2017-12-24 ENCOUNTER — Telehealth: Payer: Self-pay | Admitting: *Deleted

## 2017-12-24 DIAGNOSIS — G4733 Obstructive sleep apnea (adult) (pediatric): Secondary | ICD-10-CM

## 2017-12-24 NOTE — Telephone Encounter (Signed)
-----   Message from Sueanne Margarita, MD sent at 12/24/2017  3:29 PM EST ----- Regarding: RE: Sleep Study Please order a split night PSG  [place] ----- Message ----- From: Freada Bergeron, CMA Sent: 12/24/2017   2:44 PM To: Sueanne Margarita, MD Subject: Sleep Study                                    Per [facility] patient needs a diagnostic sleep study before she can get another CPAP within 6 months of her office visit per medicare guidelines. Please advise, Gae Bon

## 2017-12-24 NOTE — Telephone Encounter (Signed)
Per medicare guidelines patient needs a diagnostic sleep study within 6 months of her office vist.

## 2017-12-24 NOTE — Telephone Encounter (Signed)
Order sent to sleep pool. 

## 2018-01-02 ENCOUNTER — Ambulatory Visit (INDEPENDENT_AMBULATORY_CARE_PROVIDER_SITE_OTHER): Payer: Medicare Other | Admitting: Family Medicine

## 2018-01-02 ENCOUNTER — Encounter: Payer: Self-pay | Admitting: Family Medicine

## 2018-01-02 VITALS — BP 127/57 | HR 86 | Temp 98.6°F | Resp 14 | Ht 62.0 in | Wt 162.0 lb

## 2018-01-02 DIAGNOSIS — E119 Type 2 diabetes mellitus without complications: Secondary | ICD-10-CM | POA: Diagnosis not present

## 2018-01-02 DIAGNOSIS — G4700 Insomnia, unspecified: Secondary | ICD-10-CM | POA: Diagnosis not present

## 2018-01-02 DIAGNOSIS — I10 Essential (primary) hypertension: Secondary | ICD-10-CM | POA: Diagnosis not present

## 2018-01-02 LAB — POCT GLYCOSYLATED HEMOGLOBIN (HGB A1C)

## 2018-01-02 LAB — GLUCOSE, CAPILLARY: GLUCOSE-CAPILLARY: 99 mg/dL (ref 65–99)

## 2018-01-02 MED ORDER — TRAZODONE HCL 50 MG PO TABS: 50.0000 mg | ORAL_TABLET | Freq: Every evening | ORAL | 3 refills | Status: DC | PRN

## 2018-01-02 NOTE — Progress Notes (Signed)
Subjective:    Patient ID: Beth Hubbard, female    DOB: 1945-11-24, 72 y.o.   MRN: [id number]  [name], a 72 year old female presents accompanied by daughter for a 78-month follow-up of chronic conditions.  She states that she is taking all medications consistently. [name] is also followed by pain management for chronic pain.    Hypertension  This is a chronic problem. The problem is controlled. Pertinent negatives include no anxiety, blurred vision, chest pain, malaise/fatigue, orthopnea, palpitations, peripheral edema, PND, shortness of breath or sweats. Risk factors for coronary artery disease include diabetes mellitus and obesity. The current treatment provides moderate improvement. Compliance problems include diet.  There is no history of CVA or retinopathy. There is no history of chronic renal disease.  Diabetes  She presents for her follow-up diabetic visit. She has type 2 diabetes mellitus. Her disease course has been stable. Pertinent negatives for hypoglycemia include no sweats. Pertinent negatives for diabetes include no blurred vision, no chest pain, no fatigue, no foot paresthesias, no foot ulcerations, no polydipsia, no polyphagia, no polyuria, no visual change, no weakness and no weight loss. Pertinent negatives for diabetic complications include no autonomic neuropathy, CVA, heart disease, impotence, nephropathy or retinopathy. There are no known risk factors for coronary artery disease. Current diabetic treatment includes oral agent (dual therapy). She is compliant with treatment all of the time. She is following a high fat/cholesterol diet.  Insomnia  Primary symptoms: no fragmented sleep, no sleep disturbance, no difficulty falling asleep, no somnolence, no frequent awakening, no premature morning awakening, no malaise/fatigue.  The current episode started in the past 7 days. The onset quality is undetermined. The problem occurs rarely. How many beverages per day that  contain caffeine: 0 - 1.  Typical bedtime:  Other.  PMH includes: associated symptoms present, no hypertension, no depression, no family stress or anxiety, no restless leg syndrome, no work related stressors, no chronic pain, no apnea.     Past Medical History:  Diagnosis Date  . Diabetes mellitus without complication ([place])   . Excessive daytime sleepiness   . High cholesterol   . Hypertension   . Mitral regurgitation    mild by echo 03/2017  . OSA (obstructive sleep apnea) 10/18/2015   Mild OSA with AHI 12.5/hr on CPAP at 8cm H2O  . Shingles   . TIA (transient ischemic attack)    Social History   Socioeconomic History  . Marital status: Legally Separated    Spouse name: Not on file  . Number of children: Not on file  . Years of education: Not on file  . Highest education level: Not on file  Social Needs  . Financial resource strain: Not on file  . Food insecurity - worry: Not on file  . Food insecurity - inability: Not on file  . Transportation needs - medical: Not on file  . Transportation needs - non-medical: Not on file  Occupational History  . Not on file  Tobacco Use  . Smoking status: Never Smoker  . Smokeless tobacco: Never Used  Substance and Sexual Activity  . Alcohol use: No  . Drug use: No  . Sexual activity: Not on file  Other Topics Concern  . Not on file  Social History Narrative  . Not on file   Review of Systems  Constitutional: Negative for fatigue, malaise/fatigue and weight loss.  Eyes: Negative.  Negative for blurred vision.  Respiratory: Negative for apnea and shortness of breath.  Cardiovascular: Negative for chest pain, palpitations, orthopnea and PND.  Gastrointestinal: Negative.   Endocrine: Negative for polydipsia, polyphagia and polyuria.  Genitourinary: Negative for impotence.  Musculoskeletal: Negative.   Skin: Negative.   Neurological: Negative for weakness.  Hematological: Negative.   Psychiatric/Behavioral: Negative for  depression and sleep disturbance. The patient has insomnia.        Objective:   Physical Exam  Constitutional: She is oriented to person, place, and time. She appears well-developed and well-nourished.  HENT:  Head: Normocephalic and atraumatic.  Right Ear: External ear normal.  Left Ear: External ear normal.  Nose: Nose normal.  Mouth/Throat: Oropharynx is clear and moist. Abnormal dentition.  Eyes: Conjunctivae and EOM are normal. Pupils are equal, round, and reactive to light.  Neck: Normal range of motion. Neck supple.  Cardiovascular: Normal rate, regular rhythm, normal heart sounds and intact distal pulses.  Pulmonary/Chest: Effort normal and breath sounds normal.  Abdominal: Soft. Bowel sounds are normal.  Musculoskeletal: Normal range of motion.  Neurological: She is alert and oriented to person, place, and time. She has normal reflexes.  Skin: Skin is warm and dry.  Psychiatric: She has a normal mood and affect. Her behavior is normal. Judgment and thought content normal.          BP (!) 127/57 (BP Location: Right Arm, Patient Position: Sitting, Cuff Size: Large)   Pulse 86   Temp 98.6 F (37 C) (Oral)   Resp 14   Ht 5\' 2"  (1.575 m)   Wt 162 lb (73.5 kg)   SpO2 97%   BMI 29.63 kg/m  Assessment & Plan:  1. Diabetes mellitus without complication ([place]) Hemoglobin a1C unable to be detected on POC monitor. Previous hemoglobin a1C is 6.7, which is at goal. Will continue Januvia 50 mg daily.  Current CBG is 98 - HgB A1c - Hemoglobin I6N - Basic Metabolic Panel - Glucose, capillary  2. Essential hypertension Blood pressure is at goal on current medication regimen, no changes warranted.  Continue medication, monitor blood pressure at home. Continue DASH diet. Reminder to go to the ER if any CP, SOB, nausea, dizziness, severe HA, changes vision/speech, left arm numbness and tingling and jaw pain.    - Basic Metabolic Panel  3. Insomnia, unspecified type Symptoms  controlled on Trazodone, will continue Practice good sleep hygiene.  Stick to a sleep schedule, even on weekends. Exercise is great, but not too late in the day Avoid alcoholic drinks before bed Avoid large meals and beverages late before bed Don't take naps after 3 pm. Keep power naps less than 1 hour.  Relax before bed.  Take a hot bath before bed.  Have a good sleeping environment. Get rid of anything in your bedroom that might distract you from sleep.  Adopt good sleeping posture.    RTC: 3 months for chronic conditions  Donia Pounds  MSN, FNP-C [facility] [address] [place], [place] [address] [phone]

## 2018-01-02 NOTE — Patient Instructions (Addendum)
Will follow up by phone with any abnormal laboratory results Will continue Trazodone 50 mg daily for insomnia   Practice good sleep hygiene.  Stick to a sleep schedule, even on weekends. Exercise is great, but not too late in the day Avoid alcoholic drinks before bed Avoid large meals and beverages late before bed Don't take naps after 3 pm. Keep power naps less than 1 hour.  Relax before bed.  Take a hot bath before bed.  Have a good sleeping environment. Get rid of anything in your bedroom that might distract you from sleep.  Adopt good sleeping posture.   Insomnia Insomnia is a sleep disorder that makes it difficult to fall asleep or to stay asleep. Insomnia can cause tiredness (fatigue), low energy, difficulty concentrating, mood swings, and poor performance at work or school. There are three different ways to classify insomnia:  Difficulty falling asleep.  Difficulty staying asleep.  Waking up too early in the morning.  Any type of insomnia can be long-term (chronic) or short-term (acute). Both are common. Short-term insomnia usually lasts for three months or less. Chronic insomnia occurs at least three times a week for longer than three months. What are the causes? Insomnia may be caused by another condition, situation, or substance, such as:  Anxiety.  Certain medicines.  Gastroesophageal reflux disease (GERD) or other gastrointestinal conditions.  Asthma or other breathing conditions.  Restless legs syndrome, sleep apnea, or other sleep disorders.  Chronic pain.  Menopause. This may include hot flashes.  Stroke.  Abuse of alcohol, tobacco, or illegal drugs.  Depression.  Caffeine.  Neurological disorders, such as Alzheimer disease.  An overactive thyroid (hyperthyroidism).  The cause of insomnia may not be known. What increases the risk? Risk factors for insomnia include:  Gender. Women are more commonly affected than men.  Age. Insomnia is more  common as you get older.  Stress. This may involve your professional or personal life.  Income. Insomnia is more common in people with lower income.  Lack of exercise.  Irregular work schedule or night shifts.  Traveling between different time zones.  What are the signs or symptoms? If you have insomnia, trouble falling asleep or trouble staying asleep is the main symptom. This may lead to other symptoms, such as:  Feeling fatigued.  Feeling nervous about going to sleep.  Not feeling rested in the morning.  Having trouble concentrating.  Feeling irritable, anxious, or depressed.  How is this treated? Treatment for insomnia depends on the cause. If your insomnia is caused by an underlying condition, treatment will focus on addressing the condition. Treatment may also include:  Medicines to help you sleep.  Counseling or therapy.  Lifestyle adjustments.  Follow these instructions at home:  Take medicines only as directed by your health care provider.  Keep regular sleeping and waking hours. Avoid naps.  Keep a sleep diary to help you and your health care provider figure out what could be causing your insomnia. Include: ? When you sleep. ? When you wake up during the night. ? How well you sleep. ? How rested you feel the next day. ? Any side effects of medicines you are taking. ? What you eat and drink.  Make your bedroom a comfortable place where it is easy to fall asleep: ? Put up shades or special blackout curtains to block light from outside. ? Use a white noise machine to block noise. ? Keep the temperature cool.  Exercise regularly as directed by your health  care provider. Avoid exercising right before bedtime.  Use relaxation techniques to manage stress. Ask your health care provider to suggest some techniques that may work well for you. These may include: ? Breathing exercises. ? Routines to release muscle tension. ? Visualizing peaceful scenes.  Cut  back on alcohol, caffeinated beverages, and cigarettes, especially close to bedtime. These can disrupt your sleep.  Do not overeat or eat spicy foods right before bedtime. This can lead to digestive discomfort that can make it hard for you to sleep.  Limit screen use before bedtime. This includes: ? Watching TV. ? Using your smartphone, tablet, and computer.  Stick to a routine. This can help you fall asleep faster. Try to do a quiet activity, brush your teeth, and go to bed at the same time each night.  Get out of bed if you are still awake after 15 minutes of trying to sleep. Keep the lights down, but try reading or doing a quiet activity. When you feel sleepy, go back to bed.  Make sure that you drive carefully. Avoid driving if you feel very sleepy.  Keep all follow-up appointments as directed by your health care provider. This is important. Contact a health care provider if:  You are tired throughout the day or have trouble in your daily routine due to sleepiness.  You continue to have sleep problems or your sleep problems get worse. Get help right away if:  You have serious thoughts about hurting yourself or someone else. This information is not intended to replace advice given to you by your health care provider. Make sure you discuss any questions you have with your health care provider. Document Released: 11/02/2000 Document Revised: 04/06/2016 Document Reviewed: 08/06/2014 Elsevier Interactive Patient Education  [employer].

## 2018-01-03 ENCOUNTER — Telehealth: Payer: Self-pay

## 2018-01-03 DIAGNOSIS — G4700 Insomnia, unspecified: Secondary | ICD-10-CM

## 2018-01-03 LAB — BASIC METABOLIC PANEL
BUN/Creatinine Ratio: 17 (ref 12–28)
BUN: 16 mg/dL (ref 8–27)
CALCIUM: 9.4 mg/dL (ref 8.7–10.3)
CO2: 31 mmol/L — ABNORMAL HIGH (ref 20–29)
Chloride: 96 mmol/L (ref 96–106)
Creatinine, Ser: 0.94 mg/dL (ref 0.57–1.00)
GFR, EST AFRICAN AMERICAN: 70 mL/min/{1.73_m2} (ref >59)
GFR, EST NON AFRICAN AMERICAN: 61 mL/min/{1.73_m2} (ref >59)
Glucose: 99 mg/dL (ref 65–99)
POTASSIUM: 3.3 mmol/L — AB (ref 3.5–5.2)
Sodium: 145 mmol/L — ABNORMAL HIGH (ref 134–144)

## 2018-01-03 LAB — HEMOGLOBIN A1C
Est. average glucose Bld gHb Est-mCnc: 151 mg/dL
HEMOGLOBIN A1C: 6.9 % — AB (ref 4.8–5.6)

## 2018-01-03 MED ORDER — TRAZODONE HCL 50 MG PO TABS: 50.0000 mg | ORAL_TABLET | Freq: Every evening | ORAL | 3 refills | Status: DC | PRN

## 2018-01-03 NOTE — Telephone Encounter (Signed)
This has been re-sent into pharmacy. Thanks!

## 2018-01-07 ENCOUNTER — Telehealth: Payer: Self-pay

## 2018-01-07 NOTE — Telephone Encounter (Signed)
-----   Message from [name], [place] sent at 01/06/2018  8:27 AM EST ----- Regarding: lab results Please inform patient that potassium level is mildly decreased. Recommend a potassium rich diet including (green leafy veggies, bananas, apricots, etc). Will repeat potassium during scheduled follow up.   Thanks

## 2018-01-07 NOTE — Telephone Encounter (Signed)
Called and spoke with patient, advised that potassium level is mildly decreased and recommended a potassium rich diet including green leafy veggies, bananas, apricots,ect. Advised that we will recheck at next scheduled follow up. Thanks!

## 2018-01-10 ENCOUNTER — Other Ambulatory Visit: Payer: Self-pay

## 2018-01-10 DIAGNOSIS — E119 Type 2 diabetes mellitus without complications: Secondary | ICD-10-CM

## 2018-01-10 MED ORDER — SITAGLIPTIN PHOSPHATE 50 MG PO TABS
50.0000 mg | ORAL_TABLET | Freq: Every day | ORAL | 5 refills | Status: DC
Start: 2018-01-10 — End: 2018-06-24

## 2018-01-13 ENCOUNTER — Telehealth: Payer: Self-pay

## 2018-01-13 NOTE — Telephone Encounter (Signed)
Called, no answer. Left a message for patient to call back. Thanks!  

## 2018-01-15 ENCOUNTER — Telehealth: Payer: Self-pay | Admitting: *Deleted

## 2018-01-15 NOTE — Addendum Note (Signed)
Addended by: Freada Bergeron on: 01/15/2018 12:08 PM   Modules accepted: Orders

## 2018-01-15 NOTE — Telephone Encounter (Signed)
Informed patient of upcoming sleep study and patient understanding was verbalized. Patient understands her sleep study is scheduled for Wednesday January 29 2018. Patient understands her sleep study will be done at [facility] sleep lab. Patient understands she will receive a sleep packet in a week or so. Patient understands to call if she does not receive the sleep packet in a timely manner. Patient agrees with treatment and thanked me for call.

## 2018-01-15 NOTE — Telephone Encounter (Signed)
-----   Message from Almyra Free sent at 01/01/2018  1:48 PM EST ----- Regarding: RE: Sleep Study Pt can be scheduled. Let me know when and I will enter precert info. Thanks, Amy ----- Message ----- From: Freada Bergeron, CMA Sent: 12/24/2017   4:19 PM To: [facility] Subject: FW: Sleep Study                                Thanks ----- Message ----- From: Sueanne Margarita, MD Sent: 12/24/2017   3:29 PM To: Freada Bergeron, CMA Subject: RE: Sleep Study                                Please order a split night PSG  Traci ----- Message ----- From: Freada Bergeron, CMA Sent: 12/24/2017   2:44 PM To: Sueanne Margarita, MD Subject: Sleep Study                                    Per [facility] patient needs a diagnostic sleep study before she can get another CPAP within 6 months of her office visit per medicare guidelines. Please advise, Gae Bon

## 2018-01-29 ENCOUNTER — Ambulatory Visit (HOSPITAL_BASED_OUTPATIENT_CLINIC_OR_DEPARTMENT_OTHER): Payer: Medicare Other | Attending: Cardiology | Admitting: Cardiology

## 2018-01-29 VITALS — Ht 62.0 in | Wt 170.0 lb

## 2018-01-29 DIAGNOSIS — R0902 Hypoxemia: Secondary | ICD-10-CM | POA: Diagnosis not present

## 2018-01-29 DIAGNOSIS — Z9989 Dependence on other enabling machines and devices: Secondary | ICD-10-CM

## 2018-01-29 DIAGNOSIS — G4733 Obstructive sleep apnea (adult) (pediatric): Secondary | ICD-10-CM | POA: Insufficient documentation

## 2018-01-31 NOTE — Procedures (Signed)
NAME: Beth Hubbard DATE OF BIRTH:  [birth year] MEDICAL RECORD NUMBER [id number]  LOCATION: [facility]  PHYSICIAN: Alexyia Guarino  DATE OF STUDY: 01/29/2018  SLEEP STUDY TYPE: Positive Airway Pressure Titration  REFERRING PHYSICIAN: Sueanne Margarita, MD   Gender: Female D.O.B: 01-03-1946 Age (years): 88 Referring Provider: Fransico Him MD, ABSM Height (inches): 62 Interpreting Physician: Fransico Him MD, ABSM Weight (lbs): 150 RPSGT: Carolin Coy BMI: 27 MRN: [id number] Neck Size: 14.00  CLINICAL INFORMATION Sleep Study Type: Split Night CPAP  Indication for sleep study: Diabetes, Excessive Daytime Sleepiness, Fatigue, Hypertension, OSA, Snoring  Epworth Sleepiness Score: 4  SLEEP STUDY TECHNIQUE As per the AASM Manual for the Scoring of Sleep and Associated Events v2.3 (April 2016) with a hypopnea requiring 4% desaturations.  The channels recorded and monitored were frontal, central and occipital EEG, electrooculogram (EOG), submentalis EMG (chin), nasal and oral airflow, thoracic and abdominal wall motion, anterior tibialis EMG, snore microphone, electrocardiogram, and pulse oximetry. Continuous positive airway pressure (CPAP) was initiated when the patient met split night criteria and was titrated according to treat sleep-disordered breathing.  MEDICATIONS Medications self-administered by patient taken the night of the study : NEURONTIN, NORCO, CLINORIL, DESYREL  RESPIRATORY PARAMETERS Diagnostic Total AHI (/hr): 26.1  RDI (/hr):29.1  OA Index (/hr): 5.1  CA Index (/hr): 0.0 REM AHI (/hr): 98.6  NREM AHI (/hr):20.7  Supine AHI (/hr):0.0  Non-supine AHI (/hr):26.19 Min O2 Sat (%):75.0  Mean O2 (%): 89.9  Time below 88% (min):44.2   Titration Optimal Pressure (cm):15  AHI at Optimal Pressure (/hr):0.0  Min O2 at Optimal Pressure (%):89.0 Supine % at Optimal (%):0 Sleep % at Optimal (%):97   SLEEP ARCHITECTURE The recording time for  the entire night was 368.1 minutes.  During a baseline period of 211.0 minutes, the patient slept for 199.8 minutes in REM and nonREM, yielding a sleep efficiency of 94.7%%. Sleep onset after lights out was 1.1 minutes with a REM latency of 86.5 minutes. The patient spent 10.5%% of the night in stage N1 sleep, 82.5%% in stage N2 sleep, 0.0%% in stage N3 and 7.0%% in REM.  During the titration period of 155.3 minutes, the patient slept for 150.0 minutes in REM and nonREM, yielding a sleep efficiency of 96.6%%. Sleep onset after CPAP initiation was 2.8 minutes with a REM latency of 33.5 minutes. The patient spent 6.7%% of the night in stage N1 sleep, 58.0%% in stage N2 sleep, 0.0%% in stage N3 and 35.3%% in REM.  CARDIAC DATA The 2 lead EKG demonstrated sinus rhythm. The mean heart rate was 100.0 beats per minute. Other EKG findings include: None.  LEG MOVEMENT DATA The total Periodic Limb Movements of Sleep (PLMS) were 0. The PLMS index was 0.0 .  IMPRESSIONS - Moderate obstructive sleep apnea occurred during the diagnostic portion of the study(AHI = 26.1/hour). An optimal PAP pressure was selected for this patient ( 15 cm of water) - No significant central sleep apnea occurred during the diagnostic portion of the study (CAI = 0.0/hour). - Moderate oxygen desaturation was noted during the diagnostic portion of the study (Min O2 =75.0%). - The patient snored with moderate snoring volume during the diagnostic portion of the study. - No cardiac abnormalities were noted during this study. - Clinically significant periodic limb movements did not occur during sleep.  DIAGNOSIS - Obstructive Sleep Apnea (327.23 [G47.33 ICD-10]) - Nocturnal Hypoxemia  RECOMMENDATIONS - Trial of CPAP therapy on 15 cm H2O with a Small size  [employer]kel Full Face Mask Simplus mask and heated humidification. - Avoid alcohol, sedatives and other CNS depressants that may worsen sleep apnea and disrupt normal sleep  architecture. - Sleep hygiene should be reviewed to assess factors that may improve sleep quality. - Weight management and regular exercise should be initiated or continued. - Return to Sleep Center for re-evaluation after 10 weeks of therapy  [place], [place] [facility]  ELECTRONICALLY SIGNED ON:  01/31/2018, 2:44 PM [place] PH: [phone]   FX: [phone] [place]

## 2018-02-11 ENCOUNTER — Ambulatory Visit: Payer: Self-pay | Admitting: Cardiology

## 2018-02-17 ENCOUNTER — Telehealth (HOSPITAL_COMMUNITY): Payer: Self-pay | Admitting: *Deleted

## 2018-02-17 NOTE — Telephone Encounter (Signed)
-----   Message from Beth Margarita, MD sent at 01/31/2018  2:48 PM EDT ----- Please let patient know that they have significant sleep apnea and had successful PAP titration and will be set up with PAP unit.  Please let DME know that order is in EPIC.  Please set patient up for OV in 10 weeks

## 2018-02-17 NOTE — Telephone Encounter (Signed)
Informed patient of sleep study results and patient understanding was verbalized. Patient understands her sleep study showed she has sleep apnea and she had a successful CPAP titration and doctor Radford Pax has ordered her a CPAP. Patient understands she will be contacted by [facility] to set up her cpap. Patient understands to call if [facility] does not contact her with new setup in a timely manner. Patient understands they will be called once confirmation has been received from [facility] that they have received their new machine to schedule 10 week follow up appointment.  [facility] notified of new cpap order  Please add to airview Patient was grateful for the call and thanked me.

## 2018-02-22 ENCOUNTER — Other Ambulatory Visit: Payer: Self-pay | Admitting: Family Medicine

## 2018-02-22 DIAGNOSIS — E785 Hyperlipidemia, unspecified: Secondary | ICD-10-CM

## 2018-03-03 NOTE — Telephone Encounter (Signed)
Patient has a 10 week follow up appointment scheduled for Tuesday 6/26/  2019. Patient understands she needs to keep this appointment for insurance compliance. Patient was grateful for the call and thanked me.

## 2018-03-17 ENCOUNTER — Telehealth: Payer: Self-pay

## 2018-03-17 ENCOUNTER — Other Ambulatory Visit: Payer: Self-pay | Admitting: Family Medicine

## 2018-03-17 DIAGNOSIS — G4700 Insomnia, unspecified: Secondary | ICD-10-CM

## 2018-03-17 NOTE — Telephone Encounter (Signed)
[place],  Can flexeril be refilled? Not last prescribed in this office. Please advise. Thanks!

## 2018-03-18 ENCOUNTER — Telehealth: Payer: Self-pay

## 2018-03-18 ENCOUNTER — Other Ambulatory Visit: Payer: Self-pay | Admitting: Family Medicine

## 2018-03-18 DIAGNOSIS — E785 Hyperlipidemia, unspecified: Secondary | ICD-10-CM

## 2018-03-19 NOTE — Telephone Encounter (Signed)
Left a message to advise patient that we did not prescribe flexeril and she will need to request a refill by prescriber. Thanks!

## 2018-04-02 ENCOUNTER — Ambulatory Visit: Payer: Self-pay | Admitting: Family Medicine

## 2018-04-03 ENCOUNTER — Other Ambulatory Visit: Payer: Self-pay | Admitting: Internal Medicine

## 2018-04-03 DIAGNOSIS — Z1231 Encounter for screening mammogram for malignant neoplasm of breast: Secondary | ICD-10-CM

## 2018-04-11 ENCOUNTER — Ambulatory Visit (INDEPENDENT_AMBULATORY_CARE_PROVIDER_SITE_OTHER): Payer: Medicare Other | Admitting: Family Medicine

## 2018-04-11 ENCOUNTER — Encounter: Payer: Self-pay | Admitting: Family Medicine

## 2018-04-11 VITALS — BP 122/62 | HR 84 | Temp 99.2°F | Resp 14 | Ht 62.0 in | Wt 160.0 lb

## 2018-04-11 DIAGNOSIS — E119 Type 2 diabetes mellitus without complications: Secondary | ICD-10-CM

## 2018-04-11 DIAGNOSIS — I10 Essential (primary) hypertension: Secondary | ICD-10-CM | POA: Diagnosis not present

## 2018-04-11 LAB — POCT URINALYSIS DIPSTICK
BILIRUBIN UA: NEGATIVE
Glucose, UA: NEGATIVE
KETONES UA: NEGATIVE
NITRITE UA: NEGATIVE
PH UA: 5.5 (ref 5.0–8.0)
PROTEIN UA: POSITIVE — AB
SPEC GRAV UA: 1.02 (ref 1.010–1.025)
UROBILINOGEN UA: 0.2 U/dL

## 2018-04-11 LAB — POCT GLYCOSYLATED HEMOGLOBIN (HGB A1C): HEMOGLOBIN A1C: 6 % — AB (ref 4.0–5.6)

## 2018-04-11 NOTE — Patient Instructions (Signed)

## 2018-04-11 NOTE — Progress Notes (Signed)
Subjective:    Patient ID: Beth Hubbard, female    DOB: 1946-08-18, 72 y.o.   MRN: [id number]  Beth Hubbard, a 72 year old female presents accompanied by daughter for a 73-month follow-up of chronic conditions.  She states that she is taking all medications consistently. Beth Hubbard is also followed by pain management for chronic pain.    Hypertension  This is a chronic problem. The problem is controlled. Pertinent negatives include no anxiety, blurred vision, chest pain, malaise/fatigue, orthopnea, palpitations, peripheral edema, PND, shortness of breath or sweats. Risk factors for coronary artery disease include diabetes mellitus and obesity. The current treatment provides moderate improvement. Compliance problems include diet.  There is no history of CVA or retinopathy. There is no history of chronic renal disease.  Diabetes  She presents for her follow-up diabetic visit. She has type 2 diabetes mellitus. Her disease course has been stable. Pertinent negatives for hypoglycemia include no sweats. Pertinent negatives for diabetes include no blurred vision, no chest pain, no fatigue, no foot paresthesias, no foot ulcerations, no polydipsia, no polyphagia, no polyuria, no visual change, no weakness and no weight loss. Pertinent negatives for diabetic complications include no autonomic neuropathy, CVA, heart disease, impotence, nephropathy or retinopathy. There are no known risk factors for coronary artery disease. Current diabetic treatment includes oral agent (dual therapy). She is compliant with treatment all of the time. She is following a high fat/cholesterol diet.   Past Medical History:  Diagnosis Date  . Diabetes mellitus without complication ([place])   . Excessive daytime sleepiness   . High cholesterol   . Hypertension   . Mitral regurgitation    mild by echo 03/2017  . OSA (obstructive sleep apnea) 10/18/2015   Mild OSA with AHI 12.5/hr on CPAP at 8cm H2O  . Shingles   . TIA  (transient ischemic attack)    Social History   Socioeconomic History  . Marital status: Legally Separated    Spouse name: Not on file  . Number of children: Not on file  . Years of education: Not on file  . Highest education level: Not on file  Occupational History  . Not on file  Social Needs  . Financial resource strain: Not on file  . Food insecurity:    Worry: Not on file    Inability: Not on file  . Transportation needs:    Medical: Not on file    Non-medical: Not on file  Tobacco Use  . Smoking status: Never Smoker  . Smokeless tobacco: Never Used  Substance and Sexual Activity  . Alcohol use: No  . Drug use: No  . Sexual activity: Not on file  Lifestyle  . Physical activity:    Days per week: Not on file    Minutes per session: Not on file  . Stress: Not on file  Relationships  . Social connections:    Talks on phone: Not on file    Gets together: Not on file    Attends religious service: Not on file    Active member of club or organization: Not on file    Attends meetings of clubs or organizations: Not on file    Relationship status: Not on file  . Intimate partner violence:    Fear of current or ex partner: Not on file    Emotionally abused: Not on file    Physically abused: Not on file    Forced sexual activity: Not on file  Other Topics Concern  .  Not on file  Social History Narrative  . Not on file   Review of Systems  Constitutional: Negative for fatigue, malaise/fatigue and weight loss.  Eyes: Negative.  Negative for blurred vision.  Respiratory: Negative for apnea and shortness of breath.   Cardiovascular: Negative for chest pain, palpitations, orthopnea and PND.  Gastrointestinal: Negative.   Endocrine: Negative for polydipsia, polyphagia and polyuria.  Genitourinary: Negative for impotence.  Musculoskeletal: Left upper extremity pain Skin: Negative.   Neurological: Negative for weakness.  Hematological: Negative.    Psychiatric/Behavioral: Negative for depression and sleep disturbance. The patient has insomnia.        Objective:   Physical Exam  Constitutional: She is oriented to person, place, and time. She appears well-developed and well-nourished.  HENT:  Head: Normocephalic and atraumatic.  Right Ear: External ear normal.  Left Ear: External ear normal.  Nose: Nose normal.  Mouth/Throat: Oropharynx is clear and moist. Abnormal dentition.  Eyes: Conjunctivae and EOM are normal. Pupils are equal, round, and reactive to light.  Neck: Normal range of motion. Neck supple.  Cardiovascular: Normal rate, regular rhythm, normal heart sounds and intact distal pulses.  Pulmonary/Chest: Effort normal and breath sounds normal.  Abdominal: Soft. Bowel sounds are normal.  Musculoskeletal: Normal range of motion. Left upper extremity, decreased range of motion, tender to palpation along deltoid Neurological: She is alert and oriented to person, place, and time. She has normal reflexes.  Skin: Skin is warm and dry.  Psychiatric: She has a normal mood and affect. Her behavior is normal. Judgment and thought content normal.          BP 122/62 (BP Location: Right Arm, Patient Position: Sitting, Cuff Size: Normal)   Pulse 84   Temp 99.2 F (37.3 C) (Oral)   Resp 14   Ht 5\' 2"  (1.575 m)   Wt 160 lb (72.6 kg)   SpO2 98%   BMI 29.26 kg/m  Assessment & Plan:  1. Essential hypertension Blood pressure is at goal on current medication regimen, no medication changes warranted on today.- Continue medication, monitor blood pressure at home. Continue DASH diet. Reminder to report to  ER if any CP, SOB, nausea, dizziness, severe HA, changes vision/speech, left arm numbness and tingling and jaw pain.  - Urinalysis Dipstick - Basic Metabolic Panel  2. Diabetes mellitus without complication (HCC) Hemoglobin A1c is 6.0, which is at goal.  No medication   Changes warranted divided over small meals throughout the  day..  Continue carbohydrate modify diet - HgB G8T - Basic Metabolic Panel   RTC 3 months for chronic conditions   Donia Pounds  MSN, FNP-C [facility] [address] [place], [place] [address] [phone]

## 2018-04-12 LAB — BASIC METABOLIC PANEL
BUN / CREAT RATIO: 10 — AB (ref 12–28)
BUN: 11 mg/dL (ref 8–27)
CO2: 29 mmol/L (ref 20–29)
CREATININE: 1.07 mg/dL — AB (ref 0.57–1.00)
Calcium: 9.5 mg/dL (ref 8.7–10.3)
Chloride: 96 mmol/L (ref 96–106)
GFR calc Af Amer: 60 mL/min/{1.73_m2} (ref >59)
GFR, EST NON AFRICAN AMERICAN: 52 mL/min/{1.73_m2} — AB (ref >59)
Glucose: 88 mg/dL (ref 65–99)
Potassium: 3.5 mmol/L (ref 3.5–5.2)
SODIUM: 141 mmol/L (ref 134–144)

## 2018-04-15 ENCOUNTER — Telehealth: Payer: Self-pay

## 2018-04-15 NOTE — Telephone Encounter (Signed)
Called and spoke with daughter. Advised of creatinine being elevated and no medication changes were needed at this time. Advised her that patient should take all medications consistently and we will recheck level in 3 months. Thanks!

## 2018-04-15 NOTE — Telephone Encounter (Signed)
-----   Message from [name], [place] sent at 04/15/2018  6:01 AM EDT ----- Regarding: lab results Please inform patient that serum creatinine (indicator of kidney functioning) is mildly elevated. No medication changes warranted at this time. Advise patient to continue to take medications as prescribed. Will recheck creatinine level in 3 months.   Donia Pounds  MSN, FNP-C [facility] [address] [place], [place] [address] [phone]

## 2018-04-16 ENCOUNTER — Other Ambulatory Visit: Payer: Self-pay | Admitting: Family Medicine

## 2018-04-16 DIAGNOSIS — E785 Hyperlipidemia, unspecified: Secondary | ICD-10-CM

## 2018-04-22 ENCOUNTER — Ambulatory Visit: Payer: Medicare Other | Attending: Orthopedic Surgery | Admitting: Physical Therapy

## 2018-04-22 DIAGNOSIS — M79602 Pain in left arm: Secondary | ICD-10-CM | POA: Diagnosis not present

## 2018-04-22 DIAGNOSIS — M542 Cervicalgia: Secondary | ICD-10-CM

## 2018-04-22 DIAGNOSIS — R296 Repeated falls: Secondary | ICD-10-CM | POA: Diagnosis present

## 2018-04-22 DIAGNOSIS — M6281 Muscle weakness (generalized): Secondary | ICD-10-CM | POA: Diagnosis present

## 2018-04-22 NOTE — Patient Instructions (Signed)
Elbow: Flexion    Use other hand to bend elbow with thumb toward the same shoulder. Do NOT force this motion. Hold ____ seconds. Repeat ____ times. Do ____ sessions per day. CAUTION: Movement should be gentle, steady and slow. Activity: Bring popsicle or lollipop to mouth.   EXTENSION: Supine - Elbow Extended (Isometric)    Lie on back, right arm at side on surface. Press arm down into surface with elbow straight. Hold ___ seconds. Complete ___ sets of ___ repetitions. Perform ___ sessions per day.  Copyright  VHI. All rights reserved.  Copyright  VHI. All rights reserved.

## 2018-04-22 NOTE — Therapy (Signed)
[place], [place], [address] Phone: [phone]   Fax:  [phone]  Physical Therapy Evaluation  Patient Details  Name: Beth Hubbard MRN: [id number] Date of Birth: 1946/10/08 Referring Provider: Dr. Edmonia Lynch   Encounter Date: 04/22/2018  PT End of Session - 04/22/18 1555    Visit Number  1    Number of Visits  16    Date for PT Re-Evaluation  06/17/18    PT Start Time  1500    PT Stop Time  1555    PT Time Calculation (min)  55 min    Activity Tolerance  Patient limited by pain    Behavior During Therapy  [facility] for tasks assessed/performed       Past Medical History:  Diagnosis Date  . Diabetes mellitus without complication ([place])   . Excessive daytime sleepiness   . High cholesterol   . Hypertension   . Mitral regurgitation    mild by echo 03/2017  . OSA (obstructive sleep apnea) 10/18/2015   Mild OSA with AHI 12.5/hr on CPAP at 8cm H2O  . Shingles   . TIA (transient ischemic attack)     Past Surgical History:  Procedure Laterality Date  . ABDOMINAL SURGERY    . ROTATOR CUFF REPAIR     bilateral  . TEE WITHOUT CARDIOVERSION N/A 07/20/2016   Procedure: TRANSESOPHAGEAL ECHOCARDIOGRAM (TEE);  Surgeon: Pixie Casino, MD;  Location[facility] ENDOSCOPY;  Service: Cardiovascular;  Laterality: N/A;    There were no vitals filed for this visit.   Subjective Assessment - 04/22/18 1505    Subjective  This patient presents with complaint of L arm and neck pain, difficulty walking.  She reports falling in Dec 2018 and injuring her L side.  She cont to have pain for awhile but eventually it improved.  She again fell 03/27/18.  She reports her arm was cast, but then found it was not broken sothey removed the cast.  She is a poor historian.  She wants to have her L arm looked at further.  It does not feel like arthritis.  She falls due to knee L side buckling.  Due to pain, she has difficulty lifting,  bending, doing housework , doing her hair/ADLs, using L arm in general.  Walking, feels off balance. She has been in [place] for 22 yrs.  Remotely mentions bilateral shoulder surgery, a "broken back" but details are unclear.     Pertinent History  1998 back injury.  bilateral Shoulder surgery 1999? , diabetes, HTN , TIA , falls 2016, 12/18, 03/27/2108    Limitations  Sitting;Lifting;Standing;Walking;House hold activities    How long can you sit comfortably?  does not like to sit still , leans R in eval     How long can you stand comfortably?  not long, unable     How long can you walk comfortably?  not long, unable     Diagnostic tests  none recent.  MRI L spine 2016 showed DDD, arthritis, broad based disc bulge. CT scan 2016: showed multi level  stenosis, arthrosis throughout, central disc protrusion    Patient Stated Goals  Patient would to be able to do her normal activities.     Currently in Pain?  Yes    Pain Score  7     Pain Location  Arm    Pain Orientation  Left    Pain Descriptors / Indicators  Burning;Tightness    Pain Type  Acute pain    Pain Radiating Towards  neck to hand, fingers numb at times     Pain Onset  1 to 4 weeks ago    Pain Frequency  Constant    Aggravating Factors   constant     Pain Relieving Factors  wrapping her arm, medicine, heat     Effect of Pain on Daily Activities  limits her ability to work in the home     Multiple Pain Sites  No         [facility] PT Assessment - 04/22/18 0001      Assessment   Medical Diagnosis  L elbow, gait     Referring Provider  Dr. Edmonia Lynch    Onset Date/Surgical Date  03/27/18 acute on chronic     Hand Dominance  Right    Prior Therapy  Yes 2016      Precautions   Precautions  Fall      Restrictions   Weight Bearing Restrictions  No      Balance Screen   Has the patient fallen in the past 6 months  Yes    How many times?  1    Has the patient had a decrease in activity level because of a fear of falling?   Yes    Is  the patient reluctant to leave their home because of a fear of falling?   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    Leisure  read books, watch TV , grandkids       Cognition   Overall Cognitive Status  Within Functional Limits for tasks assessed      Observation/Other Assessments   Focus on Therapeutic Outcomes (FOTO)   NT too much pain, language barrier to a degree      Sensation   Light Touch  Impaired by gross assessment    Additional Comments  hyposensitive L UE       Coordination   Gross Motor Movements are Fluid and Coordinated  Not tested      Posture/Postural Control   Posture/Postural Control  Postural limitations    Postural Limitations  Rounded Shoulders;Forward head;Flexed trunk    Posture Comments  guards L arm , leans R       AROM   Left Shoulder Extension  26 Degrees    Left Shoulder Flexion  70 Degrees    Left Shoulder ABduction  38 Degrees    Left Elbow Flexion  130    Left Elbow Extension  22    Cervical Flexion  40  pain     Cervical Extension  25 pain     Cervical - Right Side Bend  20 pain     Cervical - Left Side Bend  20 pain     Cervical - Right Rotation  40 pain     Cervical - Left Rotation  30 pain       PROM  Overall PROM Comments  pain in elbow ext, supination, shoulder PROM all planes, unable to tolerate cervical PROM      Strength   Overall Strength Comments  shoulder not tolerated    Right Knee Flexion  4+/5    Right Knee Extension  4/5    Left Knee Flexion  3+/5    Left Knee Extension  4/5    Right Ankle Dorsiflexion  4/5    Left Ankle  Dorsiflexion  3+/5      Palpation   Palpation comment  pain with palpation along entire L UE, lateral cervicals.  Pain prox to elbow (biceps)      Special Tests   Other special tests  too painful, cervical testing not reliable due to high levels of pain       Transfers   Transfers  Supine to Sit    Supine to Sit  4: Min assist      Ambulation/Gait   Ambulation Distance (Feet)  150 Feet    Assistive device  None    Gait Pattern  Decreased stride length;Shuffle;Antalgic    Ambulation Surface  Level;Indoor      MHP L neck and shoulder 10 min   Objective measurements completed on examination: See above findings.      PT Education - 04/22/18 2059    Education Details  PT/POC, posture, eval findings, sources of pain, HEP for ROM    Person(s) Educated  Patient    Methods  Explanation;Verbal cues;Handout    Comprehension  Verbalized understanding;Need further instruction       PT Short Term Goals - 04/22/18 2103      PT SHORT TERM GOAL #1   Title  Pt will demo good sitting posture to improve comfort at rest.    Time  4    Period  Weeks    Status  New    Target Date  05/20/18      PT SHORT TERM GOAL #2   Title  Pt will tolerate cervical and UE ROM with min increase in pain     Time  4    Period  Weeks    Status  New    Target Date  05/20/18      PT SHORT TERM GOAL #3   Title  Pt will complete balance screen and set goal based on result , use asst device if warranted.     Time  4    Period  Weeks    Status  New    Target Date  05/20/18        PT Long Term Goals - 04/22/18 2100      PT LONG TERM GOAL #1   Title  Pt will be able to perform HEP Independently     Time  8    Period  Weeks    Status  New    Target Date  06/17/18      PT LONG TERM GOAL #2   Title  Patient will have 25% less pain with use of LUE for ADLs and home tasks.     Time  8    Period  Weeks    Status  New    Target Date  06/17/18      PT LONG TERM GOAL #3   Title  Patient will be  able to raise L arm to 100 deg for improved functional use of her arm in the home.     Time  8  Period  Weeks    Status  New    Target Date  06/17/18      PT LONG TERM GOAL #4   Title  Gait goal TBA based on screen    Time  8    Period  Weeks    Status  New    Target Date  06/17/18             Plan - 04/22/18 2106    Clinical Impression Statement  This patient presents for high complexity eval of L elbow decreased ROM (as well as shoulder and neck), gait abnormality.  She presents with signs and symptoms consistent with cervical radiculopathy as neck movements in supine refr pain to lower arm.  the flexion contracture at the elbow is likely due to her fall but there is a strong suspicion of a cervical component given her history and pain description. She speaks [place] but is difficult to understand fully when she is in pain and expressing her frustration.  We will work with her in improving ROM thorughout her uppoer quarter.  Gait will be assessed further as today was used solely for UE pain.      History and Personal Factors relevant to plan of care:  known spinal pathology, falls, DM, obesity, HTN, TIA, mitral regurgitation    Clinical Presentation  Unstable    Clinical Presentation due to:  repeated falls, unknown source of UE pain and stiffness, gait abnormality    Clinical Decision Making  High    Rehab Potential  Good    PT Frequency  2x / week    PT Duration  8 weeks    PT Treatment/Interventions  ADLs/Self Care Home Management;Electrical Stimulation;Functional mobility training;Neuromuscular re-education;Taping;Therapeutic activities;Cryotherapy;Ultrasound;Moist Heat;Gait training;Stair training;Balance training;Therapeutic exercise;Patient/family education;Manual techniques;Passive range of motion    PT Next Visit Plan  ROM cervical, shoulder and elbow.  Reduce pain.  modalities. Gait assessment (TUG with/without device)     PT Home Exercise Plan  elbow ROM, chin tuck  /posture    Consulted and Agree with Plan of Care  Patient       Patient will benefit from skilled therapeutic intervention in order to improve the following deficits and impairments:  Abnormal gait, Decreased balance, Decreased endurance, Decreased mobility, Difficulty walking, Hypomobility, Impaired sensation, Obesity, Improper body mechanics, Decreased range of motion, Decreased activity tolerance, Decreased strength, Increased fascial restricitons, Impaired flexibility, Impaired UE functional use, Postural dysfunction, Pain, Decreased knowledge of use of DME  Visit Diagnosis: Pain in left arm  Muscle weakness (generalized)  Repeated falls  Cervicalgia     Problem List Patient Active Problem List   Diagnosis Date Noted  . OSA (obstructive sleep apnea) 10/18/2015  . Mitral regurgitation 05/18/2015  . Excessive daytime sleepiness 02/28/2015  . TIA (transient ischemic attack)   . Hypertension   . Diabetes mellitus without complication ([place])   . High cholesterol     Beth Hubbard 04/22/2018, 9:17 PM  [facility] [place], [place], [address] Phone: [phone]   Fax:  [phone]  Name: ILONA COLLEY MRN: [id number] Date of Birth: 1946-08-07  Raeford Razor, PT 04/22/18 9:17 PM Phone: [phone] Fax: [phone]

## 2018-04-29 ENCOUNTER — Ambulatory Visit
Admission: RE | Admit: 2018-04-29 | Discharge: 2018-04-29 | Disposition: A | Discharge status: Home / Self Care | Payer: Medicare Other | Source: Ambulatory Visit | Attending: Internal Medicine | Admitting: Internal Medicine

## 2018-04-29 DIAGNOSIS — Z1231 Encounter for screening mammogram for malignant neoplasm of breast: Secondary | ICD-10-CM

## 2018-05-03 ENCOUNTER — Other Ambulatory Visit: Payer: Self-pay | Admitting: Family Medicine

## 2018-05-03 DIAGNOSIS — I10 Essential (primary) hypertension: Secondary | ICD-10-CM

## 2018-05-07 ENCOUNTER — Encounter: Payer: Self-pay | Admitting: Physical Therapy

## 2018-05-07 ENCOUNTER — Ambulatory Visit: Payer: Medicare Other | Admitting: Physical Therapy

## 2018-05-07 DIAGNOSIS — M79602 Pain in left arm: Secondary | ICD-10-CM | POA: Diagnosis not present

## 2018-05-07 DIAGNOSIS — M6281 Muscle weakness (generalized): Secondary | ICD-10-CM

## 2018-05-07 DIAGNOSIS — M542 Cervicalgia: Secondary | ICD-10-CM

## 2018-05-07 DIAGNOSIS — R296 Repeated falls: Secondary | ICD-10-CM

## 2018-05-07 NOTE — Therapy (Signed)
[place], [place], [address] Phone: [phone]   Fax:  [phone]  Physical Therapy Treatment  Patient Details  Name: Beth Hubbard MRN: [id number] Date of Birth: Oct 03, 1946 Referring Provider: Dr. Edmonia Lynch   Encounter Date: 05/07/2018  PT End of Session - 05/07/18 1536    Visit Number  2    Number of Visits  16    Date for PT Re-Evaluation  06/17/18    PT Start Time  1050    PT Stop Time  1136    PT Time Calculation (min)  46 min    Activity Tolerance  Patient tolerated treatment well    Behavior During Therapy  [facility] for tasks assessed/performed       Past Medical History:  Diagnosis Date  . Diabetes mellitus without complication ([place])   . Excessive daytime sleepiness   . High cholesterol   . Hypertension   . Mitral regurgitation    mild by echo 03/2017  . OSA (obstructive sleep apnea) 10/18/2015   Mild OSA with AHI 12.5/hr on CPAP at 8cm H2O  . Shingles   . TIA (transient ischemic attack)     Past Surgical History:  Procedure Laterality Date  . ABDOMINAL SURGERY    . ROTATOR CUFF REPAIR     bilateral  . TEE WITHOUT CARDIOVERSION N/A 07/20/2016   Procedure: TRANSESOPHAGEAL ECHOCARDIOGRAM (TEE);  Surgeon: Pixie Casino, MD;  Location: [facility] ENDOSCOPY;  Service: Cardiovascular;  Laterality: N/A;    There were no vitals filed for this visit.  Subjective Assessment - 05/07/18 1156    Subjective  Started Dexamethazone by mouth and pain is a little better.  Less burning into arm.    Currently in Pain?  Yes    Pain Score  -- mild    Pain Location  Arm    Pain Orientation  Left    Pain Descriptors / Indicators  Aching;Tightness    Pain Radiating Towards  to proximal arm ( 2 inches below elbow crease) from left ear    Pain Frequency  Intermittent    Aggravating Factors   Turning head to the left    Pain Relieving Factors  wrapping arm tight    Effect of Pain on Daily Activities  pain  worse at night,  limits sleeping.  washing dishes  sweeping    Multiple Pain Sites  -- low back hurts if going to rain aching,  long standing,  left knee pain long standing last injection 2-3 months.  Helpful.         [facility] PT Assessment - 05/07/18 0001      Ambulation/Gait   Gait Comments  TUG 20 seconds                   [facility] Adult PT Treatment/Exercise - 05/07/18 0001      Self-Care   Self-Care  Posture sitting cues and practiced with rolled sheet along spine      Elbow Exercises   Elbow Flexion  10 reps slow ,  with pain    Elbow Extension  10 reps slow with pain      Neck Exercises: Seated   Cervical Rotation  5 reps 2 sets ,  1 set with head on fist to take weight of head     Cervical Rotation Limitations  painful,  small motions    Lateral Flexion  5 reps    Lateral Flexion Limitations  AA with hand to  rest head small movements painful    Other Seated Exercise  cervical flexion 5 X with hand assist.    Other Seated Exercise  Chin tuck 5 x 5 seconds,  moderate cues      Manual Therapy   Manual therapy comments  soft tissue work shoulder and upper back neck,  anterior shoulder and chest    [place];Inhibit Muscle      Kinesiotix   Create Space  X pattern 50 % stretchmedial anterior elbow to decrease pain    Inhibit Muscle   biceps      Neck Exercises: Stretches   Upper Trapezius Stretch  2 reps;20 seconds    Upper Trapezius Stretch Limitations  cued for technique ,  needs to avoid pain.             PT Education - 05/07/18 1535    Education Details  posture,  exercise form,  how tape works.     Person(s) Educated  Patient    Methods  Explanation;Demonstration;Tactile cues;Verbal cues    Comprehension  Verbalized understanding;Returned demonstration       PT Short Term Goals - 04/22/18 2103      PT SHORT TERM GOAL #1   Title  Pt will demo good sitting posture to improve comfort at rest.    Time  4    Period  Weeks     Status  New    Target Date  05/20/18      PT SHORT TERM GOAL #2   Title  Pt will tolerate cervical and UE ROM with min increase in pain     Time  4    Period  Weeks    Status  New    Target Date  05/20/18      PT SHORT TERM GOAL #3   Title  Pt will complete balance screen and set goal based on result , use asst device if warranted.     Time  4    Period  Weeks    Status  New    Target Date  05/20/18        PT Long Term Goals - 04/22/18 2100      PT LONG TERM GOAL #1   Title  Pt will be able to perform HEP Independently     Time  8    Period  Weeks    Status  New    Target Date  06/17/18      PT LONG TERM GOAL #2   Title  Patient will have 25% less pain with use of LUE for ADLs and home tasks.     Time  8    Period  Weeks    Status  New    Target Date  06/17/18      PT LONG TERM GOAL #3   Title  Patient will be able to raise L arm to 100 deg for improved functional use of her arm in the home.     Time  8    Period  Weeks    Status  New    Target Date  06/17/18      PT LONG TERM GOAL #4   Title  Gait goal TBA based on screen    Time  8    Period  Weeks    Status  New    Target Date  06/17/18            Plan - 05/07/18 1537  Clinical Impression Statement  Patient able to participate with exercise today however most exetrcises were painful with small movements.  Pain addressed with manual techniques.  She reported less pain post session.  TUG 20 seconds    PT Next Visit Plan  ROM cervical, shoulder and elbow.  Reduce pain.  modalities. assess tape  progress HEP as able    PT Home Exercise Plan  elbow ROM, chin tuck /posture    Consulted and Agree with Plan of Care  Patient       Patient will benefit from skilled therapeutic intervention in order to improve the following deficits and impairments:     Visit Diagnosis: Pain in left arm  Muscle weakness (generalized)  Repeated falls  Cervicalgia     Problem List Patient Active Problem List    Diagnosis Date Noted  . OSA (obstructive sleep apnea) 10/18/2015  . Mitral regurgitation 05/18/2015  . Excessive daytime sleepiness 02/28/2015  . TIA (transient ischemic attack)   . Hypertension   . Diabetes mellitus without complication ([place])   . High cholesterol     Math Brazie  PTA 05/07/2018, 3:43 PM  [facility] [address] [place], [place], [address] Phone: [phone]   Fax:  [phone]  Name: MAEGEN WIGLE MRN: [id number] Date of Birth: Jan 29, 1946

## 2018-05-08 ENCOUNTER — Other Ambulatory Visit: Payer: Self-pay | Admitting: Family Medicine

## 2018-05-08 DIAGNOSIS — E785 Hyperlipidemia, unspecified: Secondary | ICD-10-CM

## 2018-05-09 ENCOUNTER — Ambulatory Visit: Payer: Medicare Other | Admitting: Physical Therapy

## 2018-05-09 ENCOUNTER — Encounter: Payer: Self-pay | Admitting: Physical Therapy

## 2018-05-09 DIAGNOSIS — M6281 Muscle weakness (generalized): Secondary | ICD-10-CM

## 2018-05-09 DIAGNOSIS — M79602 Pain in left arm: Secondary | ICD-10-CM

## 2018-05-09 DIAGNOSIS — R296 Repeated falls: Secondary | ICD-10-CM

## 2018-05-09 DIAGNOSIS — M542 Cervicalgia: Secondary | ICD-10-CM

## 2018-05-09 NOTE — Therapy (Signed)
[place], [place], [address] Phone: [phone]   Fax:  [phone]  Physical Therapy Treatment  Patient Details  Name: Beth Hubbard MRN: [id number] Date of Birth: 1946-09-24 Referring Provider: Dr. Edmonia Lynch   Encounter Date: 05/09/2018  PT End of Session - 05/09/18 0934    Visit Number  3    Number of Visits  16    Date for PT Re-Evaluation  06/17/18    PT Start Time  0930    PT Stop Time  1015    PT Time Calculation (min)  45 min       Past Medical History:  Diagnosis Date  . Diabetes mellitus without complication ([place])   . Excessive daytime sleepiness   . High cholesterol   . Hypertension   . Mitral regurgitation    mild by echo 03/2017  . OSA (obstructive sleep apnea) 10/18/2015   Mild OSA with AHI 12.5/hr on CPAP at 8cm H2O  . Shingles   . TIA (transient ischemic attack)     Past Surgical History:  Procedure Laterality Date  . ABDOMINAL SURGERY    . ROTATOR CUFF REPAIR     bilateral  . TEE WITHOUT CARDIOVERSION N/A 07/20/2016   Procedure: TRANSESOPHAGEAL ECHOCARDIOGRAM (TEE);  Surgeon: Pixie Casino, MD;  Location: [facility] ENDOSCOPY;  Service: Cardiovascular;  Laterality: N/A;    There were no vitals filed for this visit.  Subjective Assessment - 05/09/18 0932    Currently in Pain?  Yes    Pain Score  3     Pain Location  Shoulder    Pain Orientation  Left    Pain Descriptors / Indicators  Aching;Tightness                       [facility] Adult PT Treatment/Exercise - 05/09/18 0001      Standardized Balance Assessment   Standardized Balance Assessment  Berg Balance Test      Berg Balance Test   Sit to Stand  Able to stand  independently using hands    Standing Unsupported  Able to stand safely 2 minutes    Sitting with Back Unsupported but Feet Supported on Floor or Stool  Able to sit safely and securely 2 minutes    Stand to Sit  Controls descent by using  hands    Transfers  Able to transfer safely, minor use of hands    Standing Unsupported with Eyes Closed  Able to stand 10 seconds safely    Standing Ubsupported with Feet Together  Able to place feet together independently and stand 1 minute safely    From Standing, Reach Forward with Outstretched Arm  Can reach forward >12 cm safely (5")    From Standing Position, Pick up Object from Floor  Unable to pick up shoe, but reaches 2-5 cm (1-2") from shoe and balances independently    From Standing Position, Turn to Look Behind Over each Shoulder  Looks behind one side only/other side shows less weight shift neck pain turning right    Turn 360 Degrees  Able to turn 360 degrees safely but slowly 8 sec each way    Standing Unsupported, Alternately Place Feet on Step/Stool  Able to stand independently and safely and complete 8 steps in 20 seconds    Standing Unsupported, One Foot in Front  Able to place foot tandem independently and hold 30 seconds    Standing on One Leg  Able to lift leg independently and hold > 10 seconds    Total Score  48      Elbow Exercises   Elbow Flexion  10 reps slow ,  with pain    Elbow Extension  10 reps slow with pain      Neck Exercises: Seated   Neck Retraction  10 reps cues to use fingers on chin for good form    Cervical Rotation  5 reps    Cervical Rotation Limitations  painful,  small motions    Other Seated Exercise  cervical flexion and extension in comfortable ROM      Manual Therapy   Manual therapy comments  Right cervical and right upper trap                PT Short Term Goals - 04/22/18 2103      PT SHORT TERM GOAL #1   Title  Pt will demo good sitting posture to improve comfort at rest.    Time  4    Period  Weeks    Status  New    Target Date  05/20/18      PT SHORT TERM GOAL #2   Title  Pt will tolerate cervical and UE ROM with min increase in pain     Time  4    Period  Weeks    Status  New    Target Date  05/20/18      PT  SHORT TERM GOAL #3   Title  Pt will complete balance screen and set goal based on result , use asst device if warranted.     Time  4    Period  Weeks    Status  New    Target Date  05/20/18        PT Long Term Goals - 04/22/18 2100      PT LONG TERM GOAL #1   Title  Pt will be able to perform HEP Independently     Time  8    Period  Weeks    Status  New    Target Date  06/17/18      PT LONG TERM GOAL #2   Title  Patient will have 25% less pain with use of LUE for ADLs and home tasks.     Time  8    Period  Weeks    Status  New    Target Date  06/17/18      PT LONG TERM GOAL #3   Title  Patient will be able to raise L arm to 100 deg for improved functional use of her arm in the home.     Time  8    Period  Weeks    Status  New    Target Date  06/17/18      PT LONG TERM GOAL #4   Title  Gait goal TBA based on screen    Time  8    Period  Weeks    Status  New    Target Date  06/17/18            Plan - 05/09/18 1015    Clinical Impression Statement  BERG 48/56. She arrives without AD however states she has a RW and and SPC at home. She forgot her cane today. Reviewed cervical AROM from last visit. Began supine AAROM shoulder cane exercises with min increase in pain. Performed STW at end of session to left neck and upper  trap. She reports less pain with Cervical rotation afterward.     PT Next Visit Plan  ROM cervical, shoulder and elbow.  Reduce pain.  modalities. assess tape  progress HEP as able, did she get AROM exercises last time?     PT Home Exercise Plan  elbow ROM, chin tuck /posture    Consulted and Agree with Plan of Care  Patient       Patient will benefit from skilled therapeutic intervention in order to improve the following deficits and impairments:  Abnormal gait, Decreased balance, Decreased endurance, Decreased mobility, Difficulty walking, Hypomobility, Impaired sensation, Obesity, Improper body mechanics, Decreased range of motion, Decreased  activity tolerance, Decreased strength, Increased fascial restricitons, Impaired flexibility, Impaired UE functional use, Postural dysfunction, Pain, Decreased knowledge of use of DME  Visit Diagnosis: Pain in left arm  Muscle weakness (generalized)  Repeated falls  Cervicalgia     Problem List Patient Active Problem List   Diagnosis Date Noted  . OSA (obstructive sleep apnea) 10/18/2015  . Mitral regurgitation 05/18/2015  . Excessive daytime sleepiness 02/28/2015  . TIA (transient ischemic attack)   . Hypertension   . Diabetes mellitus without complication ([place])   . High cholesterol     Dorene Ar, [place] 05/09/2018, 10:34 AM  [facility] [address] [place], [place], [address] Phone: [phone]   Fax:  [phone]  Name: ALINE WESCHE MRN: [id number] Date of Birth: 06/02/1946

## 2018-05-12 ENCOUNTER — Ambulatory Visit: Payer: Medicare Other | Admitting: Physical Therapy

## 2018-05-12 ENCOUNTER — Encounter: Payer: Self-pay | Admitting: Physical Therapy

## 2018-05-12 VITALS — BP 131/65 | HR 88

## 2018-05-12 DIAGNOSIS — M542 Cervicalgia: Secondary | ICD-10-CM

## 2018-05-12 DIAGNOSIS — M6281 Muscle weakness (generalized): Secondary | ICD-10-CM

## 2018-05-12 DIAGNOSIS — M79602 Pain in left arm: Secondary | ICD-10-CM

## 2018-05-12 DIAGNOSIS — R296 Repeated falls: Secondary | ICD-10-CM

## 2018-05-12 NOTE — Therapy (Signed)
[place], [place], [address] Phone: [phone]   Fax:  [phone]  Physical Therapy Treatment  Patient Details  Name: Beth Hubbard MRN: [id number] Date of Birth: 12-02-1945 Referring Provider: Dr. Edmonia Lynch   Encounter Date: 05/12/2018  PT End of Session - 05/12/18 1247    Visit Number  4    Number of Visits  16    Date for PT Re-Evaluation  06/17/18    PT Start Time  1148    PT Stop Time  1248    PT Time Calculation (min)  60 min    Activity Tolerance  Patient tolerated treatment well    Behavior During Therapy  [facility] for tasks assessed/performed       Past Medical History:  Diagnosis Date  . Diabetes mellitus without complication ([place])   . Excessive daytime sleepiness   . High cholesterol   . Hypertension   . Mitral regurgitation    mild by echo 03/2017  . OSA (obstructive sleep apnea) 10/18/2015   Mild OSA with AHI 12.5/hr on CPAP at 8cm H2O  . Shingles   . TIA (transient ischemic attack)     Past Surgical History:  Procedure Laterality Date  . ABDOMINAL SURGERY    . ROTATOR CUFF REPAIR     bilateral  . TEE WITHOUT CARDIOVERSION N/A 07/20/2016   Procedure: TRANSESOPHAGEAL ECHOCARDIOGRAM (TEE);  Surgeon: Pixie Casino, MD;  Location: [facility] ENDOSCOPY;  Service: Cardiovascular;  Laterality: N/A;    Vitals:   05/12/18 1151  BP: 131/65  Pulse: 88    Subjective Assessment - 05/12/18 1151    Subjective  Finnished medication.  I have been doing the exercises.   Yesterday I slipped in the kitchen and hurt right knee( 4/10) and shoulder  (4/10)  neck in much better no pain in neck.  Elbow is loosening a little bit.   neck is moving more than before. Able to wash light dished without pain.      Currently in Pain?  Yes    Pain Score  3     Pain Location  Shoulder    Pain Orientation  Left    Pain Descriptors / Indicators  Aching;Tightness    Pain Radiating Towards  to proximal arm,  to  ear,  not as bad    Pain Frequency  Intermittent    Aggravating Factors   turning head  LT>  RT    Pain Relieving Factors  pain medication    Effect of Pain on Daily Activities  limits washing heavy dishes         [facility] PT Assessment - 05/12/18 0001      AROM   Cervical - Right Rotation  38    Cervical - Left Rotation  42                   [facility] Adult PT Treatment/Exercise - 05/12/18 0001      Elbow Exercises   Elbow Flexion  AROM    Elbow Extension  AROM      Neck Exercises: Seated   Cervical Rotation  5 reps    Cervical Rotation Limitations  AA with hand,  Cued     Lateral Flexion  5 reps    Lateral Flexion Limitations  AA with hand cues needed    Other Seated Exercise  cervical flexion,  5 X 5 seconds,  hand for assist    Other Seated Exercise  scapular  retraction      Moist Heat Therapy   Number Minutes Moist Heat  10 Minutes    Moist Heat Location  Cervical;Shoulder      Manual Therapy   Manual therapy comments  Right cervical and right upper trap     Kinesiotex  Inhibit Muscle      Kinesiotix   Inhibit Muscle   biceps,  upper trap      Neck Exercises: Stretches   Upper Trapezius Stretch  2 reps;10 seconds             PT Education - 05/12/18 1158    Education Details  fall prevention.   HEP    Person(s) Educated  Patient    Methods  Explanation;Handout    Comprehension  Verbalized understanding       PT Short Term Goals - 04/22/18 2103      PT SHORT TERM GOAL #1   Title  Pt will demo good sitting posture to improve comfort at rest.    Time  4    Period  Weeks    Status  New    Target Date  05/20/18      PT SHORT TERM GOAL #2   Title  Pt will tolerate cervical and UE ROM with min increase in pain     Time  4    Period  Weeks    Status  New    Target Date  05/20/18      PT SHORT TERM GOAL #3   Title  Pt will complete balance screen and set goal based on result , use asst device if warranted.     Time  4    Period  Weeks     Status  New    Target Date  05/20/18        PT Long Term Goals - 04/22/18 2100      PT LONG TERM GOAL #1   Title  Pt will be able to perform HEP Independently     Time  8    Period  Weeks    Status  New    Target Date  06/17/18      PT LONG TERM GOAL #2   Title  Patient will have 25% less pain with use of LUE for ADLs and home tasks.     Time  8    Period  Weeks    Status  New    Target Date  06/17/18      PT LONG TERM GOAL #3   Title  Patient will be able to raise L arm to 100 deg for improved functional use of her arm in the home.     Time  8    Period  Weeks    Status  New    Target Date  06/17/18      PT LONG TERM GOAL #4   Title  Gait goal TBA based on screen    Time  8    Period  Weeks    Status  New    Target Date  06/17/18            Plan - 05/12/18 1249    Clinical Impression Statement  Patient slipped in kitchen and hurt knee and right shoulder.  Fall prevention handout issued,  she may have had a wet floor.  [place] today she was a little dizzy.  BP  131/ 65,  HR 88.  Crevical ROM rotation to right improved 12 degrees.  pain decreased at end of session .    PT Next Visit Plan  ROM cervical, shoulder and elbow.  Reduce pain.  modalities.  progress HEP as able,     PT Home Exercise Plan  elbow ROM, chin tuck /posture,  Cervical ROM sitting (Uses hand),  scapular retraction,     Consulted and Agree with Plan of Care  Patient       Patient will benefit from skilled therapeutic intervention in order to improve the following deficits and impairments:     Visit Diagnosis: Pain in left arm  Muscle weakness (generalized)  Repeated falls  Cervicalgia     Problem List Patient Active Problem List   Diagnosis Date Noted  . OSA (obstructive sleep apnea) 10/18/2015  . Mitral regurgitation 05/18/2015  . Excessive daytime sleepiness 02/28/2015  . TIA (transient ischemic attack)   . Hypertension   . Diabetes mellitus without complication ([place])   .  High cholesterol     Jamielee Mchale   PTA 05/12/2018, 12:53 PM  [facility] [address] [place], [place], [address] Phone: [phone]   Fax:  [phone]  Name: Beth Hubbard MRN: [id number] Date of Birth: 01/05/1946

## 2018-05-12 NOTE — Patient Instructions (Addendum)

## 2018-05-14 ENCOUNTER — Encounter: Payer: Self-pay | Admitting: Physical Therapy

## 2018-05-14 ENCOUNTER — Encounter: Payer: Self-pay | Admitting: Cardiology

## 2018-05-14 ENCOUNTER — Telehealth: Payer: Self-pay | Admitting: *Deleted

## 2018-05-14 ENCOUNTER — Ambulatory Visit (INDEPENDENT_AMBULATORY_CARE_PROVIDER_SITE_OTHER): Payer: Medicare Other | Admitting: Cardiology

## 2018-05-14 VITALS — BP 138/62 | HR 99 | Ht 62.0 in | Wt 165.4 lb

## 2018-05-14 DIAGNOSIS — I10 Essential (primary) hypertension: Secondary | ICD-10-CM | POA: Diagnosis not present

## 2018-05-14 DIAGNOSIS — G4733 Obstructive sleep apnea (adult) (pediatric): Secondary | ICD-10-CM

## 2018-05-14 DIAGNOSIS — E669 Obesity, unspecified: Secondary | ICD-10-CM | POA: Diagnosis not present

## 2018-05-14 NOTE — Telephone Encounter (Signed)
-----   Message from Teressa Senter, RN sent at 05/14/2018  1:01 PM EDT ----- Regarding: dme order  DME order placed   Thanks  Rena

## 2018-05-14 NOTE — Progress Notes (Signed)
Cardiology Office Note:    Date:  05/14/2018   ID:  [name], Hubbard 1946-10-20, MRN [id number]  PCP:  [name], FNP  Cardiologist:  No primary care provider on file.    Referring MD: [name], FNP   Chief Complaint  Patient presents with  . Sleep Apnea  . Hypertension    History of Present Illness:    Beth Hubbard is a 72 y.o. female with a hx of  mild OSA with an AHI of 11.9/hr with oxygen desaturations as low as 83% and is on CPAP at15cm H2O. she is now back for follow-up after getting started back on CPAP again after losing it for noncompliance.  This office visit was required by insurance to document compliance after restarting.She is doing well with her CPAP device and thinks that she has gotten used to it.  She tolerates the mask and feels the pressure is adequate.  Since going on CPAP she feels rested in the am and has no significant daytime sleepiness.  She denies any significant mouth or nasal dryness or nasal congestion.  She does not think that he snores.     Past Medical History:  Diagnosis Date  . Diabetes mellitus without complication ([place])   . Excessive daytime sleepiness   . High cholesterol   . Hypertension   . Mitral regurgitation    mild by echo 03/2017  . OSA (obstructive sleep apnea) 10/18/2015   Mild OSA with AHI 12.5/hr on CPAP at 8cm H2O  . Shingles   . TIA (transient ischemic attack)     Past Surgical History:  Procedure Laterality Date  . ABDOMINAL SURGERY    . ROTATOR CUFF REPAIR     bilateral  . TEE WITHOUT CARDIOVERSION N/A 07/20/2016   Procedure: TRANSESOPHAGEAL ECHOCARDIOGRAM (TEE);  Surgeon: Pixie Casino, MD;  Location: [facility] ENDOSCOPY;  Service: Cardiovascular;  Laterality: N/A;    Current Medications: Current Meds  Medication Sig  . ACCU-CHEK AVIVA PLUS test strip Reported on 02/24/2016  . ACCU-CHEK SOFTCLIX LANCETS lancets Check BS once or twice a day.  Marland Kitchen amLODipine (NORVASC) 5 MG tablet TAKE 1 TABLET BY MOUTH ONCE  DAILY  . Blood Glucose Monitoring Suppl (ACCU-CHEK AVIVA PLUS) w/Device KIT 1 kit by Does not apply route as directed. Check 1 to 2 times daily  . Calcium Carbonate-Vitamin D (CALCIUM 600/VITAMIN D) 600-400 MG-UNIT chew tablet Chew 1 tablet by mouth daily.  . COMBIGAN 0.2-0.5 % ophthalmic solution Place 1 drop into the left eye 2 (two) times daily.  . cyclobenzaprine (FLEXERIL) 10 MG tablet Take 10 mg by mouth 3 (three) times daily as needed for muscle spasms.  Marland Kitchen dexamethasone (DECADRON) 4 MG tablet Take 4 mg by mouth 2 (two) times daily with a meal.  . ferrous sulfate 325 (65 FE) MG tablet Take 325 mg by mouth as directed.  . folic acid (FOLVITE) 975 MCG tablet Take 400 mcg by mouth daily.  Marland Kitchen HYDROcodone-acetaminophen (NORCO/VICODIN) 5-325 MG tablet Take 1 tablet by mouth as directed.  . rosuvastatin (CRESTOR) 20 MG tablet TAKE 1 TABLET BY MOUTH ONCE DAILY  . sitaGLIPtin (JANUVIA) 50 MG tablet Take 1 tablet (50 mg total) by mouth daily.  . traZODone (DESYREL) 50 MG tablet TAKE 1 TABLET BY MOUTH AT BEDTIME IF NEEDED FOR SLEEP  . triamcinolone cream (KENALOG) 0.1 % Apply 1 application topically 2 (two) times daily.     Allergies:   Aspirin; Penicillins; and Nsaids   Social History  Socioeconomic History  . Marital status: Legally Separated    Spouse name: Not on file  . Number of children: Not on file  . Years of education: Not on file  . Highest education level: Not on file  Occupational History  . Not on file  Social Needs  . Financial resource strain: Not on file  . Food insecurity:    Worry: Not on file    Inability: Not on file  . Transportation needs:    Medical: Not on file    Non-medical: Not on file  Tobacco Use  . Smoking status: Never Smoker  . Smokeless tobacco: Never Used  Substance and Sexual Activity  . Alcohol use: No  . Drug use: No  . Sexual activity: Not on file  Lifestyle  . Physical activity:    Days per week: Not on file    Minutes per session: Not  on file  . Stress: Not on file  Relationships  . Social connections:    Talks on phone: Not on file    Gets together: Not on file    Attends religious service: Not on file    Active member of club or organization: Not on file    Attends meetings of clubs or organizations: Not on file    Relationship status: Not on file  Other Topics Concern  . Not on file  Social History Narrative  . Not on file     Family History: The patient's family history includes Cancer in her brother, father, and sister.  ROS:   Please see the history of present illness.    ROS  All other systems reviewed and negative.   EKGs/Labs/Other Studies Reviewed:    The following studies were reviewed today: PAP download  EKG:  EKG is not ordered today.    Recent Labs: 06/28/2017: Hemoglobin 11.2; Platelets 242 10/02/2017: ALT 21 04/11/2018: BUN 11; Creatinine, Ser 1.07; Potassium 3.5; Sodium 141   Recent Lipid Panel    Component Value Date/Time   CHOL 192 04/08/2017 0909   TRIG 136 04/08/2017 0909   HDL 72 04/08/2017 0909   CHOLHDL 2.7 04/08/2017 0909   VLDL 27 04/08/2017 0909   LDLCALC 93 04/08/2017 0909    Physical Exam:    VS:  BP 138/62   Pulse 99   Ht _0  (1.575 m)   Wt 165 lb 6.4 oz (75 kg)   SpO2 99%   BMI 30.25 kg/m     Wt Readings from Last 3 Encounters:  05/14/18 165 lb 6.4 oz (75 kg)  04/11/18 160 lb (72.6 kg)  01/29/18 170 lb (77.1 kg)     GEN:  Well nourished, well developed in no acute distress HEENT: Normal NECK: No JVD; No carotid bruits LYMPHATICS: No lymphadenopathy CARDIAC: RRR, no murmurs, rubs, gallops RESPIRATORY:  Clear to auscultation without rales, wheezing or rhonchi  ABDOMEN: Soft, non-tender, non-distended MUSCULOSKELETAL:  No edema; No deformity  SKIN: Warm and dry NEUROLOGIC:  Alert and oriented x 3 PSYCHIATRIC:  Normal affect   ASSESSMENT:    1. OSA (obstructive sleep apnea)   2. Essential hypertension   3. Obesity (BMI 30-39.9)    PLAN:      In order of problems listed above:  1.  OSA -  The PAP download was reviewed today and showed an AHI of 1.4/hr on 15 cm H2O with 33% compliance in using more than 4 hours nightly.  The patient has been using and benefiting from PAP use and will  continue to benefit from therapy.  I have encouraged her to try to be more compliant with her device.  She is using her device on average 3 and half hours a day.  I told her she needs to aim for at least 5 hours nightly.  If she does not then she will lose her device again.  She says the pressure is too much so I am going to set her on auto CPAP from 4 to 14 cm's H2O and get a download in 2 weeks.  2.  HTN - BP is controlled on exam today.  She will continue on amlodipine 5 mg daily.  3.  Obesity - I have encouraged her to get into a routine exercise program and cut back on carbs and portions.    Medication Adjustments/Labs and Tests Ordered: Current medicines are reviewed at length with the patient today.  Concerns regarding medicines are outlined above.  No orders of the defined types were placed in this encounter.  No orders of the defined types were placed in this encounter.   Signed, Fransico Him, MD  05/14/2018 12:49 PM    [place]

## 2018-05-14 NOTE — Patient Instructions (Addendum)
Medication Instructions:  Your physician recommends that you continue on your current medications as directed. Please refer to the Current Medication list given to you today.  If you need a refill on your cardiac medications, please contact your pharmacy first.  Labwork: None ordered   Testing/Procedures: None ordered   Follow-Up: Your physician wants you to follow-up in: 1 year with Dr. Radford Pax. You will receive a reminder letter in the mail two months in advance. If you don't receive a letter, please call our office to schedule the follow-up appointment.  Any Other Special Instructions Will Be Listed Below (If Applicable). CPAP has been changed to auto 4-14 cm water pressure and Dr. Radford Pax will get a download in 2 weeks   Thank you for choosing [place], [place]  If you need a refill on your cardiac medications before your next appointment, please call your pharmacy.

## 2018-05-14 NOTE — Telephone Encounter (Signed)
Change CPAP setting to auto 4-14 cm water pressure and get D/L in 2 weeks  Order faxed to [facility]

## 2018-05-19 ENCOUNTER — Ambulatory Visit: Payer: Medicare Other | Attending: Orthopedic Surgery | Admitting: Physical Therapy

## 2018-05-19 ENCOUNTER — Encounter: Payer: Self-pay | Admitting: Physical Therapy

## 2018-05-19 DIAGNOSIS — R296 Repeated falls: Secondary | ICD-10-CM | POA: Diagnosis present

## 2018-05-19 DIAGNOSIS — M6281 Muscle weakness (generalized): Secondary | ICD-10-CM | POA: Insufficient documentation

## 2018-05-19 DIAGNOSIS — M542 Cervicalgia: Secondary | ICD-10-CM

## 2018-05-19 DIAGNOSIS — M79602 Pain in left arm: Secondary | ICD-10-CM | POA: Diagnosis present

## 2018-05-19 NOTE — Patient Instructions (Signed)
Elbow Flexion: Resisted    With tubing wrapped around left fist and other end secured under foot, curl arm up as far as possible. Repeat ___10_ times per set. Do ___2_ sets per session. Do _2___ sessions per day.  Copyright  VHI. All rights reserved.

## 2018-05-19 NOTE — Therapy (Signed)
[place], [place], [address] Phone: [phone]   Fax:  [phone]  Physical Therapy Treatment  Patient Details  Name: Beth Hubbard MRN: [id number] Date of Birth: May 17, 1946 Referring Provider: Dr. Edmonia Lynch   Encounter Date: 05/19/2018  PT End of Session - 05/19/18 1348    Visit Number  5    Number of Visits  16    Date for PT Re-Evaluation  06/17/18    PT Start Time  1331    PT Stop Time  1424    PT Time Calculation (min)  53 min    Activity Tolerance  Patient tolerated treatment well    Behavior During Therapy  [facility] for tasks assessed/performed       Past Medical History:  Diagnosis Date  . Diabetes mellitus without complication ([place])   . Excessive daytime sleepiness   . High cholesterol   . Hypertension   . Mitral regurgitation    mild by echo 03/2017  . OSA (obstructive sleep apnea) 10/18/2015   Mild OSA with AHI 12.5/hr on CPAP at 8cm H2O  . Shingles   . TIA (transient ischemic attack)     Past Surgical History:  Procedure Laterality Date  . ABDOMINAL SURGERY    . ROTATOR CUFF REPAIR     bilateral  . TEE WITHOUT CARDIOVERSION N/A 07/20/2016   Procedure: TRANSESOPHAGEAL ECHOCARDIOGRAM (TEE);  Surgeon: Pixie Casino, MD;  Location: [facility] ENDOSCOPY;  Service: Cardiovascular;  Laterality: N/A;    There were no vitals filed for this visit.  Subjective Assessment - 05/19/18 1334    Subjective  Pain still in L arm and forearm.  Knee pain 5/10.  Has not seen a MD though. MD visit 05/26/18    Currently in Pain?  Yes    Pain Score  7     Pain Location  Arm    Pain Orientation  Left    Pain Descriptors / Indicators  Aching    Pain Type  Acute pain    Pain Onset  More than a month ago         [facility] PT Assessment - 05/19/18 0001      AROM   Left Shoulder Flexion  75 Degrees    Left Elbow Extension  32        [facility] Adult PT Treatment/Exercise - 05/19/18 0001      Elbow  Exercises   Elbow Flexion  AROM    Elbow Extension  AROM    Forearm Supination  AROM    Forearm Pronation  AROM    Other elbow exercises  bicep curl yellow x 10     Other elbow exercises  done in supine to tolerance       Neck Exercises: Seated   Shoulder ABduction  Both;10 reps    Postural Training  asked her to move only her arms not neck and head       Shoulder Exercises: Supine   Other Supine Exercises  supine cane chest press x 10, overhead x 10       Shoulder Exercises: Seated   Retraction  Strengthening;Both;10 reps    Row  Strengthening    Theraband Level (Shoulder Row)  -- pain     Horizontal ABduction  Strengthening;Both;10 reps    Horizontal ABduction Weight (lbs)  arms only     External Rotation  Strengthening;Both;10 reps    Theraband Level (Shoulder External Rotation)  Level 1 (Yellow)  Shoulder Exercises: Therapy Ball   Flexion  Both;10 reps      Moist Heat Therapy   Number Minutes Moist Heat  10 Minutes    Moist Heat Location  Shoulder;Cervical      Neck Exercises: Stretches   Upper Trapezius Stretch  3 reps;20 seconds    Upper Trapezius Stretch Limitations  pain, manual A needed very limited in ROM     Other Neck Stretches  rotation bilateral x 5                PT Short Term Goals - 05/19/18 1357      PT SHORT TERM GOAL #1   Title  Pt will demo good sitting posture to improve comfort at rest.    Status  On-going      PT SHORT TERM GOAL #2   Title  Pt will tolerate cervical and UE ROM with min increase in pain     Baseline  mod to severe pain today     Status  On-going      PT SHORT TERM GOAL #3   Title  Pt will complete balance screen and set goal based on result , use asst device if warranted.     Baseline  Goal set today 52/56 (was 48/56)     Status  Achieved        PT Long Term Goals - 05/19/18 1401      PT LONG TERM GOAL #1   Title  Pt will be able to perform HEP Independently     Status  On-going      PT LONG TERM GOAL  #2   Title  Patient will have 25% less pain with use of LUE for ADLs and home tasks.     Status  On-going      PT LONG TERM GOAL #3   Title  Patient will be able to raise L arm to 100 deg for improved functional use of her arm in the home.     Status  On-going      PT LONG TERM GOAL #4   Title  52/56 Berg balance goal            Plan - 05/19/18 1414    Clinical Impression Statement  Patient reports subjecvtively she is a bit better.  She is very limited in cervical ROM, elbow flexed to apprx. 30 deg and pain with PROM. During cane exercises she had spasm in L shoulder/UE.  Needs constant cues for technique and modfication for minimizing pain during exercise.     PT Treatment/Interventions  ADLs/Self Care Home Management;Electrical Stimulation;Functional mobility training;Neuromuscular re-education;Taping;Therapeutic activities;Cryotherapy;Ultrasound;Moist Heat;Gait training;Stair training;Balance training;Therapeutic exercise;Patient/family education;Manual techniques;Passive range of motion    PT Next Visit Plan  ROM cervical, shoulder and elbow.  Reduce pain.  modalities.  progress HEP as able.  Soft tissue to neck and L upper trap    PT Home Exercise Plan  elbow ROM, chin tuck /posture,  Cervical ROM sitting (Uses hand),  scapular retraction,     Consulted and Agree with Plan of Care  Patient       Patient will benefit from skilled therapeutic intervention in order to improve the following deficits and impairments:  Abnormal gait, Decreased balance, Decreased endurance, Decreased mobility, Difficulty walking, Hypomobility, Impaired sensation, Obesity, Improper body mechanics, Decreased range of motion, Decreased activity tolerance, Decreased strength, Increased fascial restricitons, Impaired flexibility, Impaired UE functional use, Postural dysfunction, Pain, Decreased knowledge of use of DME  Visit Diagnosis:  Pain in left arm  Muscle weakness (generalized)  Repeated  falls  Cervicalgia     Problem List Patient Active Problem List   Diagnosis Date Noted  . OSA (obstructive sleep apnea) 10/18/2015  . Mitral regurgitation 05/18/2015  . Excessive daytime sleepiness 02/28/2015  . TIA (transient ischemic attack)   . Hypertension   . Diabetes mellitus without complication ([place])   . High cholesterol     PAA,JENNIFER 05/19/2018, 3:06 PM  [place], [place], [address] Phone: [phone]   Fax:  [phone]  Name: Beth Hubbard MRN: [id number] Date of Birth: [birth year]  Raeford Razor, PT 05/19/18 3:06 PM Phone: [phone] Fax: [phone]

## 2018-05-21 ENCOUNTER — Ambulatory Visit: Payer: Medicare Other | Admitting: Physical Therapy

## 2018-05-21 ENCOUNTER — Encounter: Payer: Self-pay | Admitting: Physical Therapy

## 2018-05-21 DIAGNOSIS — M79602 Pain in left arm: Secondary | ICD-10-CM

## 2018-05-21 DIAGNOSIS — R296 Repeated falls: Secondary | ICD-10-CM

## 2018-05-21 DIAGNOSIS — M542 Cervicalgia: Secondary | ICD-10-CM

## 2018-05-21 DIAGNOSIS — M6281 Muscle weakness (generalized): Secondary | ICD-10-CM

## 2018-05-21 NOTE — Therapy (Signed)
[place], [place], [address] Phone: [phone]   Fax:  [phone]  Physical Therapy Treatment  Patient Details  Name: Beth Hubbard MRN: [id number] Date of Birth: 09/11/1946 Referring Provider: Dr. Edmonia Lynch   Encounter Date: 05/21/2018  PT End of Session - 05/21/18 1419    Visit Number  6    Number of Visits  16    Date for PT Re-Evaluation  06/17/18    PT Start Time  1330    PT Stop Time  1427    PT Time Calculation (min)  57 min    Activity Tolerance  Patient tolerated treatment well    Behavior During Therapy  [facility] for tasks assessed/performed       Past Medical History:  Diagnosis Date  . Diabetes mellitus without complication ([place])   . Excessive daytime sleepiness   . High cholesterol   . Hypertension   . Mitral regurgitation    mild by echo 03/2017  . OSA (obstructive sleep apnea) 10/18/2015   Mild OSA with AHI 12.5/hr on CPAP at 8cm H2O  . Shingles   . TIA (transient ischemic attack)     Past Surgical History:  Procedure Laterality Date  . ABDOMINAL SURGERY    . ROTATOR CUFF REPAIR     bilateral  . TEE WITHOUT CARDIOVERSION N/A 07/20/2016   Procedure: TRANSESOPHAGEAL ECHOCARDIOGRAM (TEE);  Surgeon: Pixie Casino, MD;  Location: [facility] ENDOSCOPY;  Service: Cardiovascular;  Laterality: N/A;    There were no vitals filed for this visit.  Subjective Assessment - 05/21/18 1334    Subjective  rIGHT SHOULDER SORE AFTER MAKING BED  BETTER WITH MEDICine.  Elbow sore the morning after last session.  better with  rubbing  vicks and heat pad  helped    Currently in Pain?  Yes    Pain Score  5     Pain Location  Arm    Pain Orientation  Left    Pain Descriptors / Indicators  Aching;Heaviness    Pain Radiating Towards  elbow and shoulder    Aggravating Factors   not sure     Pain Relieving Factors  medication,  Vicks,  heating pad    Effect of Pain on Daily Activities  limits some use of  arm      Multiple Pain Sites  -- right leg  better with ace wrap.           [facility] PT Assessment - 05/21/18 0001      AROM   Cervical - Right Rotation  40    Cervical - Left Rotation  32 measure prior to treatment                   [facility] Adult PT Treatment/Exercise - 05/21/18 0001      Self-Care   Posture  sitting posture      Neck Exercises: Seated   Neck Retraction  5 reps      Shoulder Exercises: Seated   Retraction  10 reps;AROM posture improved    Row  Strengthening;10 reps    Theraband Level (Shoulder Row)  Level 2 (Red)    Horizontal ABduction  Strengthening Level 2,  small ranges    External Rotation  Strengthening;10 reps    Theraband Level (Shoulder External Rotation)  Level 1 (Yellow)      Moist Heat Therapy   Number Minutes Moist Heat  15 Minutes    Moist Heat Location  Cervical;Shoulder;Elbow      Manual Therapy   Manual Therapy  Soft tissue mobilization    Manual therapy comments  neck shoulder elbow,  light soft tissue work trigger point release mid scapula,  elbow mobs with movement for flexion and supination.               PT Education - 05/21/18 1418    Education Details  posture ed,      Person(s) Educated  Patient;Child(ren)    Methods  Explanation;Demonstration;Verbal cues    Comprehension  Verbalized understanding;Returned demonstration       PT Short Term Goals - 05/19/18 1357      PT SHORT TERM GOAL #1   Title  Pt will demo good sitting posture to improve comfort at rest.    Status  On-going      PT SHORT TERM GOAL #2   Title  Pt will tolerate cervical and UE ROM with min increase in pain     Baseline  mod to severe pain today     Status  On-going      PT SHORT TERM GOAL #3   Title  Pt will complete balance screen and set goal based on result , use asst device if warranted.     Baseline  Goal set today 52/56 (was 48/56)     Status  Achieved        PT Long Term Goals - 05/19/18 1401      PT LONG TERM GOAL #1    Title  Pt will be able to perform HEP Independently     Status  On-going      PT LONG TERM GOAL #2   Title  Patient will have 25% less pain with use of LUE for ADLs and home tasks.     Status  On-going      PT LONG TERM GOAL #3   Title  Patient will be able to raise L arm to 100 deg for improved functional use of her arm in the home.     Status  On-going      PT LONG TERM GOAL #4   Title  52/56 Berg balance goal            Plan - 05/21/18 1419    Clinical Impression Statement  ROM cervical rotation LT 32,  right 40 degrees.  Tolerated exercise with some pain right shoulder (GOOD)  better at rest and with manual.  decreased pain at end of session and rotation visably improved as she was turning to talk and look.      PT Next Visit Plan  ROM cervical, shoulder and elbow.  Reduce pain.  modalities.  progress HEP as able.  Soft tissue to neck and L upper trap    PT Home Exercise Plan  elbow ROM, chin tuck /posture,  Cervical ROM sitting (Uses hand),  scapular retraction,     Consulted and Agree with Plan of Care  Patient       Patient will benefit from skilled therapeutic intervention in order to improve the following deficits and impairments:     Visit Diagnosis: Pain in left arm  Muscle weakness (generalized)  Repeated falls  Cervicalgia     Problem List Patient Active Problem List   Diagnosis Date Noted  . OSA (obstructive sleep apnea) 10/18/2015  . Mitral regurgitation 05/18/2015  . Excessive daytime sleepiness 02/28/2015  . TIA (transient ischemic attack)   . Hypertension   . Diabetes mellitus without complication (  [place])   . High cholesterol     Sharae Zappulla PTA 05/21/2018, 2:22 PM  [facility] [address] [place], [place], [address] Phone: [phone]   Fax:  [phone]  Name: Beth Hubbard MRN: [id number] Date of Birth: Jan 12, 1946

## 2018-05-26 ENCOUNTER — Other Ambulatory Visit: Payer: Self-pay | Admitting: Family Medicine

## 2018-05-26 ENCOUNTER — Ambulatory Visit: Payer: Medicare Other | Admitting: Physical Therapy

## 2018-05-26 DIAGNOSIS — G4700 Insomnia, unspecified: Secondary | ICD-10-CM

## 2018-05-28 ENCOUNTER — Encounter: Payer: Self-pay | Admitting: Physical Therapy

## 2018-05-28 ENCOUNTER — Ambulatory Visit: Payer: Medicare Other | Admitting: Physical Therapy

## 2018-05-28 DIAGNOSIS — M79602 Pain in left arm: Secondary | ICD-10-CM | POA: Diagnosis not present

## 2018-05-28 DIAGNOSIS — M542 Cervicalgia: Secondary | ICD-10-CM

## 2018-05-28 DIAGNOSIS — M6281 Muscle weakness (generalized): Secondary | ICD-10-CM

## 2018-05-28 DIAGNOSIS — R296 Repeated falls: Secondary | ICD-10-CM

## 2018-05-28 NOTE — Therapy (Addendum)
[place], [place], [address] Phone: [phone]   Fax:  [phone]  Physical Therapy Treatment/Addended Discharge   Patient Details  Name: Beth Hubbard MRN: [id number] Date of Birth: [birth year] Referring Provider: Dr. Edmonia Lynch   Encounter Date: 05/28/2018  PT End of Session - 05/28/18 1308    Visit Number  7    Number of Visits  16    Date for PT Re-Evaluation  06/17/18    PT Start Time  1302    PT Stop Time  1342    PT Time Calculation (min)  40 min    Activity Tolerance  Patient tolerated treatment well    Behavior During Therapy  [facility] for tasks assessed/performed       Past Medical History:  Diagnosis Date  . Diabetes mellitus without complication ([place])   . Excessive daytime sleepiness   . High cholesterol   . Hypertension   . Mitral regurgitation    mild by echo 03/2017  . OSA (obstructive sleep apnea) 10/18/2015   Mild OSA with AHI 12.5/hr on CPAP at 8cm H2O  . Shingles   . TIA (transient ischemic attack)     Past Surgical History:  Procedure Laterality Date  . ABDOMINAL SURGERY    . ROTATOR CUFF REPAIR     bilateral  . TEE WITHOUT CARDIOVERSION N/A 07/20/2016   Procedure: TRANSESOPHAGEAL ECHOCARDIOGRAM (TEE);  Surgeon: Pixie Casino, MD;  Location: [facility] ENDOSCOPY;  Service: Cardiovascular;  Laterality: N/A;    There were no vitals filed for this visit.  Subjective Assessment - 05/28/18 1302    Subjective  Getting MRI this Saturday.  MD gave her elbow brace. Helping her.  Pain is off and on.      Currently in Pain?  Yes    Pain Score  7     Pain Location  Neck    Pain Orientation  Left    Pain Descriptors / Indicators  Aching    Pain Type  Acute pain    Pain Onset  More than a month ago    Pain Frequency  Intermittent    Aggravating Factors   activity     Pain Relieving Factors  rest              [facility] Adult PT Treatment/Exercise - 05/28/18 0001      Shoulder  Exercises: Seated   Flexion  AAROM;Left;20 reps UE ranger     Other Seated Exercises  circles, x 20, cross body reaching       Shoulder Exercises: Standing   Extension  Strengthening;Both;15 reps;Theraband    Theraband Level (Shoulder Extension)  Level 2 (Red)    Row  Strengthening;Both;15 reps;Theraband    Theraband Level (Shoulder Row)  Level 2 (Red)      Shoulder Exercises: Pulleys   Flexion  3 minutes      Manual Therapy   Manual Therapy  Soft tissue mobilization;Passive ROM    Soft tissue mobilization  L lateral cervicals, upper trap, suboccipitals     Passive ROM  L UE and gentle suboccipital release       Neck Exercises: Stretches   Neural Stretch  manual by PT    Other Neck Stretches  cervical rotation in supine x 10     Other Neck Stretches  chin tuck cervical retraction x 10 manual cues                PT Short Term Goals -  05/28/18 1334      PT SHORT TERM GOAL #1   Title  Pt will demo good sitting posture to improve comfort at rest.    Baseline  needs cues     Status  On-going      PT SHORT TERM GOAL #2   Title  Pt will tolerate cervical and UE ROM with min increase in pain     Baseline  mod to severe pain today     Status  On-going      PT SHORT TERM GOAL #3   Title  Pt will complete balance screen and set goal based on result , use asst device if warranted.     Baseline  Goal set today 52/56 (was 48/56)     Status  Achieved        PT Long Term Goals - 05/28/18 1335      PT LONG TERM GOAL #1   Title  Pt will be able to perform HEP Independently     Status  On-going      PT LONG TERM GOAL #2   Title  Patient will have 25% less pain with use of LUE for ADLs and home tasks.     Status  On-going      PT LONG TERM GOAL #3   Title  Patient will be able to raise L arm to 100 deg for improved functional use of her arm in the home.     Status  On-going      PT LONG TERM GOAL #4   Title  52/56 Berg balance goal    Status  On-going             Plan - 05/28/18 1307    Clinical Impression Statement  Patient with significant pain today with AAROM in supine, able to do in sitting. Nerve glides to L UE .  does not tolerate manual well, moans and pulls away.  Limited progress thus far in PT.  May decide to cancel her next appt depending on what MD says post MRI.      PT Treatment/Interventions  ADLs/Self Care Home Management;Electrical Stimulation;Functional mobility training;Neuromuscular re-education;Taping;Therapeutic activities;Cryotherapy;Ultrasound;Moist Heat;Gait training;Stair training;Balance training;Therapeutic exercise;Patient/family education;Manual techniques;Passive range of motion    PT Next Visit Plan  ROM cervical, shoulder and elbow.  Reduce pain.  modalities.  progress HEP as able.  Soft tissue to neck and L upper trap    PT Home Exercise Plan  elbow ROM, chin tuck /posture,  Cervical ROM sitting (Uses hand),  scapular retraction, supine chin tuck     Consulted and Agree with Plan of Care  Patient       Patient will benefit from skilled therapeutic intervention in order to improve the following deficits and impairments:  Abnormal gait, Decreased balance, Decreased endurance, Decreased mobility, Difficulty walking, Hypomobility, Impaired sensation, Obesity, Improper body mechanics, Decreased range of motion, Decreased activity tolerance, Decreased strength, Increased fascial restricitons, Impaired flexibility, Impaired UE functional use, Postural dysfunction, Pain, Decreased knowledge of use of DME  Visit Diagnosis: Pain in left arm  Muscle weakness (generalized)  Repeated falls  Cervicalgia     Problem List Patient Active Problem List   Diagnosis Date Noted  . OSA (obstructive sleep apnea) 10/18/2015  . Mitral regurgitation 05/18/2015  . Excessive daytime sleepiness 02/28/2015  . TIA (transient ischemic attack)   . Hypertension   . Diabetes mellitus without complication ([place])   . High  cholesterol     PAA,JENNIFER 05/28/2018, 2:38 PM  [place], [place], [address] Phone: [phone]   Fax:  [phone]  Name: Beth Hubbard MRN: [id number] Date of Birth: [birth year]  Raeford Razor, PT 05/28/18 2:38 PM Phone: [phone] Fax: [phone]  PHYSICAL THERAPY DISCHARGE SUMMARY  Visits from Start of Care: 7  Current functional level related to goals / functional outcomes: See above for most recent info    Remaining deficits: Pain, ROM, unknown details    Education / Equipment: HEP, pain control Plan: Patient agrees to discharge.  Patient goals were not met. Patient is being discharged due to not returning since the last visit.  ?????    Raeford Razor, PT 07/03/18 2:17 PM Phone: [phone] Fax: [phone]

## 2018-05-31 ENCOUNTER — Other Ambulatory Visit: Payer: Self-pay | Admitting: Family Medicine

## 2018-05-31 DIAGNOSIS — I10 Essential (primary) hypertension: Secondary | ICD-10-CM

## 2018-06-04 ENCOUNTER — Ambulatory Visit: Payer: Medicare Other | Admitting: Physical Therapy

## 2018-06-24 ENCOUNTER — Other Ambulatory Visit: Payer: Self-pay | Admitting: Family Medicine

## 2018-06-24 DIAGNOSIS — E119 Type 2 diabetes mellitus without complications: Secondary | ICD-10-CM

## 2018-06-25 ENCOUNTER — Other Ambulatory Visit: Payer: Self-pay | Admitting: Family Medicine

## 2018-06-25 ENCOUNTER — Other Ambulatory Visit: Payer: Self-pay

## 2018-06-25 DIAGNOSIS — I10 Essential (primary) hypertension: Secondary | ICD-10-CM

## 2018-06-25 MED ORDER — AMLODIPINE BESYLATE 5 MG PO TABS: 5.0000 mg | ORAL_TABLET | Freq: Every day | ORAL | 0 refills | Status: DC

## 2018-06-27 ENCOUNTER — Other Ambulatory Visit: Payer: Self-pay | Admitting: Family Medicine

## 2018-06-27 DIAGNOSIS — I10 Essential (primary) hypertension: Secondary | ICD-10-CM

## 2018-07-16 ENCOUNTER — Ambulatory Visit: Payer: Self-pay | Admitting: Family Medicine

## 2018-07-17 ENCOUNTER — Ambulatory Visit: Payer: Medicare Other | Admitting: Family Medicine

## 2018-07-24 ENCOUNTER — Other Ambulatory Visit: Payer: Self-pay

## 2018-07-24 DIAGNOSIS — E119 Type 2 diabetes mellitus without complications: Secondary | ICD-10-CM

## 2018-07-24 MED ORDER — SITAGLIPTIN PHOSPHATE 50 MG PO TABS: ORAL_TABLET | ORAL | 0 refills | Status: DC

## 2018-07-26 ENCOUNTER — Other Ambulatory Visit: Payer: Self-pay | Admitting: Family Medicine

## 2018-07-26 DIAGNOSIS — E785 Hyperlipidemia, unspecified: Secondary | ICD-10-CM

## 2018-07-29 ENCOUNTER — Other Ambulatory Visit: Payer: Self-pay | Admitting: Family Medicine

## 2018-07-29 DIAGNOSIS — E785 Hyperlipidemia, unspecified: Secondary | ICD-10-CM

## 2018-08-15 ENCOUNTER — Other Ambulatory Visit: Payer: Self-pay

## 2018-08-15 ENCOUNTER — Encounter: Payer: Self-pay | Admitting: Family Medicine

## 2018-08-15 ENCOUNTER — Ambulatory Visit (INDEPENDENT_AMBULATORY_CARE_PROVIDER_SITE_OTHER): Payer: Medicare Other | Admitting: Family Medicine

## 2018-08-15 ENCOUNTER — Ambulatory Visit: Payer: Medicare Other | Admitting: Family Medicine

## 2018-08-15 VITALS — BP 130/64 | HR 88 | Temp 99.2°F | Ht 62.0 in | Wt 167.0 lb

## 2018-08-15 DIAGNOSIS — E785 Hyperlipidemia, unspecified: Secondary | ICD-10-CM | POA: Diagnosis not present

## 2018-08-15 DIAGNOSIS — E119 Type 2 diabetes mellitus without complications: Secondary | ICD-10-CM | POA: Diagnosis not present

## 2018-08-15 DIAGNOSIS — M542 Cervicalgia: Secondary | ICD-10-CM

## 2018-08-15 DIAGNOSIS — I10 Essential (primary) hypertension: Secondary | ICD-10-CM

## 2018-08-15 DIAGNOSIS — E569 Vitamin deficiency, unspecified: Secondary | ICD-10-CM

## 2018-08-15 DIAGNOSIS — G4700 Insomnia, unspecified: Secondary | ICD-10-CM | POA: Diagnosis not present

## 2018-08-15 LAB — POCT GLYCOSYLATED HEMOGLOBIN (HGB A1C): Hemoglobin A1C: 6 % — AB (ref 4.0–5.6)

## 2018-08-15 MED ORDER — FOLIC ACID 400 MCG PO TABS: 400.0000 ug | ORAL_TABLET | Freq: Every day | ORAL | 3 refills | Status: DC

## 2018-08-15 MED ORDER — GABAPENTIN 300 MG PO CAPS: ORAL_CAPSULE | ORAL | 1 refills | Status: DC

## 2018-08-15 MED ORDER — TRAZODONE HCL 50 MG PO TABS: ORAL_TABLET | ORAL | 1 refills | Status: DC

## 2018-08-15 MED ORDER — AMLODIPINE BESYLATE 5 MG PO TABS: ORAL_TABLET | ORAL | 3 refills | Status: DC

## 2018-08-15 MED ORDER — SITAGLIPTIN PHOSPHATE 50 MG PO TABS: ORAL_TABLET | ORAL | 3 refills | Status: DC

## 2018-08-15 MED ORDER — ROSUVASTATIN CALCIUM 20 MG PO TABS: 20.0000 mg | ORAL_TABLET | Freq: Every day | ORAL | 3 refills | Status: DC

## 2018-08-15 NOTE — Progress Notes (Signed)
[facility]  DIABETES TYPE II FOLLOW UP VISIT   PROVIDER: Lanae Boast, FNP   SUBJECTIVE:   72 y.o. female  Beth Hubbard is 72 y.o. female who  has a past medical history of Diabetes mellitus without complication ([place]), Excessive daytime sleepiness, High cholesterol, Hypertension, Mitral regurgitation, OSA (obstructive sleep apnea) (10/18/2015), Shingles, and TIA (transient ischemic attack). here for follow-up of Type 2 diabetes mellitus.   Known diabetic complications: cardiovascular disease and cerebrovascular disease Cardiovascular risk factors: advanced age (older than 21 for men, 78 for women), diabetes mellitus, dyslipidemia and sedentary lifestyle Current diabetic medications include : Januvia  Eye exam current (within one year): yes Weight trend: stable Prior visit with dietician: no Current diet: in general, a "healthy" diet   Current exercise: none  Current monitoring regimen: home blood tests - daily Home blood sugar records: fasting range: 90 Any episodes of hypoglycemia? no  Current Outpatient Medications  Medication Sig Dispense Refill  . ACCU-CHEK AVIVA PLUS test strip Reported on 02/24/2016 100 each 1  . ACCU-CHEK SOFTCLIX LANCETS lancets Check BS once or twice a day. 100 each 1  . amLODipine (NORVASC) 5 MG tablet TAKE 1 TABLET(5 MG) BY MOUTH DAILY 90 tablet 0  . Blood Glucose Monitoring Suppl (ACCU-CHEK AVIVA PLUS) w/Device KIT 1 kit by Does not apply route as directed. Check 1 to 2 times daily 1 kit 0  . Calcium Carbonate-Vitamin D (CALCIUM 600/VITAMIN D) 600-400 MG-UNIT chew tablet Chew 1 tablet by mouth daily.    . COMBIGAN 0.2-0.5 % ophthalmic solution Place 1 drop into the left eye 2 (two) times daily.    . cyclobenzaprine (FLEXERIL) 10 MG tablet Take 10 mg by mouth 3 (three) times daily as needed for muscle spasms.    . ferrous sulfate 325 (65 FE) MG tablet Take 325 mg by mouth as directed.    . gabapentin  (NEURONTIN) 300 MG capsule TK 1 C PO HS  1  . HYDROcodone-acetaminophen (NORCO/VICODIN) 5-325 MG tablet Take 1 tablet by mouth as directed.  0  . rosuvastatin (CRESTOR) 20 MG tablet TAKE 1 TABLET BY MOUTH EVERY DAY 90 tablet 0  . sitaGLIPtin (JANUVIA) 50 MG tablet TAKE 1 TABLET(50 MG) BY MOUTH DAILY 30 tablet 0  . traZODone (DESYREL) 50 MG tablet TAKE 1 TABLET BY MOUTH AT BEDTIME IF NEEDED FOR SLEEP 90 tablet 0  . amLODipine (NORVASC) 5 MG tablet TAKE 1 TABLET(5 MG) BY MOUTH DAILY 30 tablet 0  . dexamethasone (DECADRON) 4 MG tablet Take 4 mg by mouth 2 (two) times daily with a meal.    . folic acid (FOLVITE) 945 MCG tablet Take 400 mcg by mouth daily.    Marland Kitchen triamcinolone cream (KENALOG) 0.1 % Apply 1 application topically 2 (two) times daily. (Patient not taking: Reported on 08/15/2018) 30 g 2   No current facility-administered medications for this visit.    Review of Systems  Constitutional: Negative.   HENT: Negative.   Eyes: Negative.   Respiratory: Negative.   Cardiovascular: Negative.   Gastrointestinal: Negative.   Genitourinary: Negative.   Musculoskeletal: Negative.   Skin: Negative.   Neurological: Negative.   Psychiatric/Behavioral: Negative.       OBJECTIVE:  BP 130/64   Pulse 88   Temp 99.2 F (37.3 C) (Oral)   Ht 5' 2"  (1.575 m)   Wt 167 lb (75.8 kg)   SpO2 98%   BMI 30.54 kg/m   Lab Results  Component  Value Date   HGBA1C 6.0 (A) 08/15/2018    Lab Results  Component Value Date   MICROALBUR 0.8 06/28/2017    Lab Results  Component Value Date   CHOL 192 04/08/2017   HDL 72 04/08/2017   LDLCALC 93 04/08/2017   TRIG 136 04/08/2017   CHOLHDL 2.7 04/08/2017     Physical Exam  Constitutional: She is oriented to person, place, and time and well-developed, well-nourished, and in no distress. No distress.  HENT:  Head: Normocephalic and atraumatic.  Eyes: Pupils are equal, round, and reactive to light. Conjunctivae and EOM are normal.  Neck: Normal range  of motion. Neck supple.  Cardiovascular: Normal rate, regular rhythm and intact distal pulses. Exam reveals no gallop and no friction rub.  No murmur heard. Pulmonary/Chest: Effort normal and breath sounds normal. No respiratory distress. She has no wheezes.  Abdominal: Soft. Bowel sounds are normal. There is no tenderness.  Musculoskeletal: Normal range of motion. She exhibits no edema or tenderness.  Lymphadenopathy:    She has no cervical adenopathy.  Neurological: She is alert and oriented to person, place, and time. Gait normal.  Skin: Skin is warm and dry.  Psychiatric: Mood, memory, affect and judgment normal.  Nursing note and vitals reviewed.    Diabetes Mellitus: well controlled  ASSESSMENT/PLAN: 1. Diabetes mellitus without complication ([place]) Patient is at goal. No medication changes.  - HgB A1c- 6.0    Issues reviewed with Elvia Collum [facility]   Recommendations: 1.  Patient is counseled on appropriate foot care. 2.  BP goal < 130/80. 3.  LDL goal of < 100, HDL > 40 and TG < 150. 4.  Eye Exam yearly and Dental Exam every 6 months. 5.  Dietary recommendations:  Carb modified/ Mediterranean Diet  6.  Physical Activity recommendations:  150 Minutes of aerobic activity weekly.  7.  Medication recommendations at this time are noted above. 8.  Return to clinic in 6 months   The patient was given clear instructions to go to ER or return to medical center if symptoms do not improve, worsen or new problems develop. Return to the clinic for scheduled follow up visits. Take medications as prescribed. The patient verbalized understanding and agreed with plan of care.    Ms. Doug Sou. Nathaneil Canary, FNP-BC Patient [facility] [address] [place], [place] [address] [phone]

## 2018-08-15 NOTE — Progress Notes (Signed)
poc4 

## 2018-08-15 NOTE — Patient Instructions (Signed)
It was a pleasure meeting you.  Your A1c is at goal. It is 6.0!!! Keep up the good work.  Continue to take your medications.  I will see you in 6 months.   Diabetes and Foot Care Diabetes may cause you to have problems because of poor blood supply (circulation) to your feet and legs. This may cause the skin on your feet to become thinner, break easier, and heal more slowly. Your skin may become dry, and the skin may peel and crack. You may also have nerve damage in your legs and feet causing decreased feeling in them. You may not notice minor injuries to your feet that could lead to infections or more serious problems. Taking care of your feet is one of the most important things you can do for yourself. Follow these instructions at home:  Wear shoes at all times, even in the house. Do not go barefoot. Bare feet are easily injured.  Check your feet daily for blisters, cuts, and redness. If you cannot see the bottom of your feet, use a mirror or ask someone for help.  Wash your feet with warm water (do not use hot water) and mild soap. Then pat your feet and the areas between your toes until they are completely dry. Do not soak your feet as this can dry your skin.  Apply a moisturizing lotion or petroleum jelly (that does not contain alcohol and is unscented) to the skin on your feet and to dry, brittle toenails. Do not apply lotion between your toes.  Trim your toenails straight across. Do not dig under them or around the cuticle. File the edges of your nails with an emery board or nail file.  Do not cut corns or calluses or try to remove them with medicine.  Wear clean socks or stockings every day. Make sure they are not too tight. Do not wear knee-high stockings since they may decrease blood flow to your legs.  Wear shoes that fit properly and have enough cushioning. To break in new shoes, wear them for just a few hours a day. This prevents you from injuring your feet. Always look in your  shoes before you put them on to be sure there are no objects inside.  Do not cross your legs. This may decrease the blood flow to your feet.  If you find a minor scrape, cut, or break in the skin on your feet, keep it and the skin around it clean and dry. These areas may be cleansed with mild soap and water. Do not cleanse the area with peroxide, alcohol, or iodine.  When you remove an adhesive bandage, be sure not to damage the skin around it.  If you have a wound, look at it several times a day to make sure it is healing.  Do not use heating pads or hot water bottles. They may burn your skin. If you have lost feeling in your feet or legs, you may not know it is happening until it is too late.  Make sure your health care provider performs a complete foot exam at least annually or more often if you have foot problems. Report any cuts, sores, or bruises to your health care provider immediately. Contact a health care provider if:  You have an injury that is not healing.  You have cuts or breaks in the skin.  You have an ingrown nail.  You notice redness on your legs or feet.  You feel burning or tingling in your  legs or feet.  You have pain or cramps in your legs and feet.  Your legs or feet are numb.  Your feet always feel cold. Get help right away if:  There is increasing redness, swelling, or pain in or around a wound.  There is a red line that goes up your leg.  Pus is coming from a wound.  You develop a fever or as directed by your health care provider.  You notice a bad smell coming from an ulcer or wound. This information is not intended to replace advice given to you by your health care provider. Make sure you discuss any questions you have with your health care provider. Document Released: 11/02/2000 Document Revised: 04/12/2016 Document Reviewed: 04/14/2013 Elsevier Interactive Patient Education  2017 [employer].

## 2018-10-23 ENCOUNTER — Telehealth: Payer: Self-pay

## 2018-10-23 DIAGNOSIS — G4700 Insomnia, unspecified: Secondary | ICD-10-CM

## 2018-10-23 DIAGNOSIS — M542 Cervicalgia: Secondary | ICD-10-CM

## 2018-10-23 MED ORDER — TRAZODONE HCL 50 MG PO TABS: ORAL_TABLET | ORAL | 1 refills | Status: DC

## 2018-10-23 MED ORDER — GABAPENTIN 300 MG PO CAPS: ORAL_CAPSULE | ORAL | 1 refills | Status: DC

## 2018-10-23 MED ORDER — CYCLOBENZAPRINE HCL 5 MG PO TABS: 5.0000 mg | ORAL_TABLET | Freq: Three times a day (TID) | ORAL | 0 refills | Status: AC | PRN

## 2018-10-23 MED ORDER — CYCLOBENZAPRINE HCL 10 MG PO TABS: 10.0000 mg | ORAL_TABLET | Freq: Three times a day (TID) | ORAL | 0 refills | Status: DC | PRN

## 2018-10-23 NOTE — Telephone Encounter (Signed)
Can her muscle relaxer be refilled? Please advise

## 2018-10-23 NOTE — Telephone Encounter (Signed)
Decreased to 5mg  po TID pRN.

## 2018-11-13 ENCOUNTER — Other Ambulatory Visit: Payer: Self-pay | Admitting: Family Medicine

## 2018-11-23 ENCOUNTER — Other Ambulatory Visit: Payer: Self-pay | Admitting: Family Medicine

## 2019-01-14 ENCOUNTER — Encounter: Payer: Self-pay | Admitting: Family Medicine

## 2019-01-14 ENCOUNTER — Ambulatory Visit (INDEPENDENT_AMBULATORY_CARE_PROVIDER_SITE_OTHER): Payer: Medicare Other | Admitting: Family Medicine

## 2019-01-14 VITALS — BP 118/86 | HR 94 | Temp 98.4°F | Ht 62.0 in | Wt 174.0 lb

## 2019-01-14 DIAGNOSIS — R059 Cough, unspecified: Secondary | ICD-10-CM

## 2019-01-14 DIAGNOSIS — I10 Essential (primary) hypertension: Secondary | ICD-10-CM | POA: Diagnosis not present

## 2019-01-14 DIAGNOSIS — R05 Cough: Secondary | ICD-10-CM

## 2019-01-14 DIAGNOSIS — Z09 Encounter for follow-up examination after completed treatment for conditions other than malignant neoplasm: Secondary | ICD-10-CM

## 2019-01-14 DIAGNOSIS — J029 Acute pharyngitis, unspecified: Secondary | ICD-10-CM | POA: Diagnosis not present

## 2019-01-14 DIAGNOSIS — R6889 Other general symptoms and signs: Secondary | ICD-10-CM | POA: Diagnosis not present

## 2019-01-14 DIAGNOSIS — E785 Hyperlipidemia, unspecified: Secondary | ICD-10-CM

## 2019-01-14 LAB — POCT RAPID STREP A (OFFICE): Rapid Strep A Screen: NEGATIVE

## 2019-01-14 LAB — POCT INFLUENZA A/B
Influenza A, POC: NEGATIVE
Influenza B, POC: NEGATIVE

## 2019-01-14 MED ORDER — BENZONATATE 100 MG PO CAPS: 100.0000 mg | ORAL_CAPSULE | Freq: Two times a day (BID) | ORAL | 0 refills | Status: DC | PRN

## 2019-01-14 MED ORDER — AMLODIPINE BESYLATE 5 MG PO TABS: ORAL_TABLET | ORAL | 3 refills | Status: DC

## 2019-01-14 MED ORDER — ROSUVASTATIN CALCIUM 20 MG PO TABS: 20.0000 mg | ORAL_TABLET | Freq: Every day | ORAL | 3 refills | Status: DC

## 2019-01-14 NOTE — Patient Instructions (Addendum)
Acetaminophen tablets or caplets What is this medicine? ACETAMINOPHEN (a set a MEE noe fen) is a pain reliever. It is used to treat mild pain and fever. This medicine may be used for other purposes; ask your health care provider or pharmacist if you have questions. COMMON BRAND NAME(S): Aceta, Actamin, Anacin Aspirin Free, Genapap, Genebs, Mapap, Pain & Fever, Pain and Fever, PAIN RELIEF, PAIN RELIEF Extra Strength, Pain Reliever, Panadol, PHARBETOL, Q-Pap, Q-Pap Extra Strength, Tylenol, Tylenol CrushableTablet, Tylenol Extra Strength, XS No Aspirin, XS Pain Reliever What should I tell my health care provider before I take this medicine? They need to know if you have any of these conditions: -if you often drink alcohol -liver disease -an unusual or allergic reaction to acetaminophen, other medicines, foods, dyes, or preservatives -pregnant or trying to get pregnant -breast-feeding How should I use this medicine? Take this medicine by mouth with a glass of water. Follow the directions on the package or prescription label. Take your medicine at regular intervals. Do not take your medicine more often than directed. Talk to your pediatrician regarding the use of this medicine in children. While this drug may be prescribed for children as young as 12 years of age for selected conditions, precautions do apply. Overdosage: If you think you have taken too much of this medicine contact a poison control center or emergency room at once. NOTE: This medicine is only for you. Do not share this medicine with others. What if I miss a dose? If you miss a dose, take it as soon as you can. If it is almost time for your next dose, take only that dose. Do not take double or extra doses. What may interact with this medicine? -alcohol -imatinib -isoniazid -other medicines with acetaminophen This list may not describe all possible interactions. Give your health care provider a list of all the medicines, herbs,  non-prescription drugs, or dietary supplements you use. Also tell them if you smoke, drink alcohol, or use illegal drugs. Some items may interact with your medicine. What should I watch for while using this medicine? Tell your doctor or health care professional if the pain lasts more than 10 days (5 days for children), if it gets worse, or if there is a new or different kind of pain. Also, check with your doctor if a fever lasts for more than 3 days. Do not take other medicines that contain acetaminophen with this medicine. Always read labels carefully. If you have questions, ask your doctor or pharmacist. If you take too much acetaminophen get medical help right away. Too much acetaminophen can be very dangerous and cause liver damage. Even if you do not have symptoms, it is important to get help right away. What side effects may I notice from receiving this medicine? Side effects that you should report to your doctor or health care professional as soon as possible: -allergic reactions like skin rash, itching or hives, swelling of the face, lips, or tongue -breathing problems -fever or sore throat -redness, blistering, peeling or loosening of the skin, including inside the mouth -trouble passing urine or change in the amount of urine -unusual bleeding or bruising -unusually weak or tired -yellowing of the eyes or skin Side effects that usually do not require medical attention (report to your doctor or health care professional if they continue or are bothersome): -headache -nausea, stomach upset This list may not describe all possible side effects. Call your doctor for medical advice about side effects. You may report side effects  to FDA at 1-800-FDA-1088. Where should I keep my medicine? Keep out of reach of children. Store at room temperature between 20 and 25 degrees C (68 and 77 degrees F). Protect from moisture and heat. Throw away any unused medicine after the expiration date. NOTE: This  sheet is a summary. It may not cover all possible information. If you have questions about this medicine, talk to your doctor, pharmacist, or health care provider.  2019 Elsevier/Gold Standard (2013-06-29 12:54:16) Viral Respiratory Infection A viral respiratory infection is an illness that affects parts of the body that are used for breathing. These include the lungs, nose, and throat. It is caused by a germ called a virus. Some examples of this kind of infection are:  A cold.  The flu (influenza).  A respiratory syncytial virus (RSV) infection. A person who gets this illness may have the following symptoms:  A stuffy or runny nose.  Yellow or green fluid in the nose.  A cough.  Sneezing.  Tiredness (fatigue).  Achy muscles.  A sore throat.  Sweating or chills.  A fever.  A headache. Follow these instructions at home: Managing pain and congestion  Take over-the-counter and prescription medicines only as told by your doctor.  If you have a sore throat, gargle with salt water. Do this 3-4 times per day or as needed. To make a salt-water mixture, dissolve -1 tsp of salt in 1 cup of warm water. Make sure that all the salt dissolves.  Use nose drops made from salt water. This helps with stuffiness (congestion). It also helps soften the skin around your nose.  Drink enough fluid to keep your pee (urine) pale yellow. General instructions   Rest as much as possible.  Do not drink alcohol.  Do not use any products that have nicotine or tobacco, such as cigarettes and e-cigarettes. If you need help quitting, ask your doctor.  Keep all follow-up visits as told by your doctor. This is important. How is this prevented?   Get a flu shot every year. Ask your doctor when you should get your flu shot.  Do not let other people get your germs. If you are sick: ? Stay home from work or school. ? Wash your hands with soap and water often. Wash your hands after you cough or  sneeze. If soap and water are not available, use hand sanitizer.  Avoid contact with people who are sick during cold and flu season. This is in fall and winter. Get help if:  Your symptoms last for 10 days or longer.  Your symptoms get worse over time.  You have a fever.  You have very bad pain in your face or forehead.  Parts of your jaw or neck become very swollen. Get help right away if:  You feel pain or pressure in your chest.  You have shortness of breath.  You faint or feel like you will faint.  You keep throwing up (vomiting).  You feel confused. Summary  A viral respiratory infection is an illness that affects parts of the body that are used for breathing.  Examples of this illness include a cold, the flu, and respiratory syncytial virus (RSV) infection.  The infection can cause a runny nose, cough, sneezing, sore throat, and fever.  Follow what your doctor tells you about taking medicines, drinking lots of fluid, washing your hands, resting at home, and avoiding people who are sick. This information is not intended to replace advice given to you by your  health care provider. Make sure you discuss any questions you have with your health care provider. Document Released: 10/18/2008 Document Revised: 12/16/2017 Document Reviewed: 12/16/2017 Elsevier Interactive Patient Education  2019 Elsevier Inc.    Cough, Adult  A cough helps to clear your throat and lungs. A cough may last only 2-3 weeks (acute), or it may last longer than 8 weeks (chronic). Many different things can cause a cough. A cough may be a sign of an illness or another medical condition. Follow these instructions at home:  Pay attention to any changes in your cough.  Take medicines only as told by your doctor. ? If you were prescribed an antibiotic medicine, take it as told by your doctor. Do not stop taking it even if you start to feel better. ? Talk with your doctor before you try using a cough  medicine.  Drink enough fluid to keep your pee (urine) clear or pale yellow.  If the air is dry, use a cold steam vaporizer or humidifier in your home.  Stay away from things that make you cough at work or at home.  If your cough is worse at night, try using extra pillows to raise your head up higher while you sleep.  Do not smoke, and try not to be around smoke. If you need help quitting, ask your doctor.  Do not have caffeine.  Do not drink alcohol.  Rest as needed. Contact a doctor if:  You have new problems (symptoms).  You cough up yellow fluid (pus).  Your cough does not get better after 2-3 weeks, or your cough gets worse.  Medicine does not help your cough and you are not sleeping well.  You have pain that gets worse or pain that is not helped with medicine.  You have a fever.  You are losing weight and you do not know why.  You have night sweats. Get help right away if:  You cough up blood.  You have trouble breathing.  Your heartbeat is very fast. This information is not intended to replace advice given to you by your health care provider. Make sure you discuss any questions you have with your health care provider. Document Released: 07/19/2011 Document Revised: 04/12/2016 Document Reviewed: 01/12/2015 Elsevier Interactive Patient Education  2019 [employer]    Benzonatate capsules What is this medicine? BENZONATATE (ben ZOE na tate) is used to treat cough. This medicine may be used for other purposes; ask your health care provider or pharmacist if you have questions. COMMON BRAND NAME(S): Tessalon Perles, Zonatuss What should I tell my health care provider before I take this medicine? They need to know if you have any of these conditions: -kidney or liver disease -an unusual or allergic reaction to benzonatate, anesthetics, other medicines, foods, dyes, or preservatives -pregnant or trying to get pregnant -breast-feeding How should I use this  medicine? Take this medicine by mouth with a glass of water. Follow the directions on the prescription label. Avoid breaking, chewing, or sucking the capsule, as this can cause serious side effects. Take your medicine at regular intervals. Do not take your medicine more often than directed. Talk to your pediatrician regarding the use of this medicine in children. While this drug may be prescribed for children as young as 37 years old for selected conditions, precautions do apply. Overdosage: If you think you have taken too much of this medicine contact a poison control center or emergency room at once. NOTE: This medicine is only for you.  Do not share this medicine with others. What if I miss a dose? If you miss a dose, take it as soon as you can. If it is almost time for your next dose, take only that dose. Do not take double or extra doses. What may interact with this medicine? Do not take this medicine with any of the following medications: -MAOIs like Carbex, Eldepryl, Marplan, Nardil, and Parnate This list may not describe all possible interactions. Give your health care provider a list of all the medicines, herbs, non-prescription drugs, or dietary supplements you use. Also tell them if you smoke, drink alcohol, or use illegal drugs. Some items may interact with your medicine. What should I watch for while using this medicine? Tell your doctor if your symptoms do not improve or if they get worse. If you have a high fever, skin rash, or headache, see your health care professional. You may get drowsy or dizzy. Do not drive, use machinery, or do anything that needs mental alertness until you know how this medicine affects you. Do not sit or stand up quickly, especially if you are an older patient. This reduces the risk of dizzy or fainting spells. What side effects may I notice from receiving this medicine? Side effects that you should report to your doctor or health care professional as soon as  possible: -allergic reactions like skin rash, itching or hives, swelling of the face, lips, or tongue -breathing problems -chest pain -confusion or hallucinations -irregular heartbeat -numbness of mouth or throat -seizures Side effects that usually do not require medical attention (report to your doctor or health care professional if they continue or are bothersome): -burning feeling in the eyes -constipation -headache -nasal congestion -stomach upset This list may not describe all possible side effects. Call your doctor for medical advice about side effects. You may report side effects to FDA at 1-800-FDA-1088. Where should I keep my medicine? Keep out of the reach of children. Store at room temperature between 15 and 30 degrees C (59 and 86 degrees F). Keep tightly closed. Protect from light and moisture. Throw away any unused medicine after the expiration date. NOTE: This sheet is a summary. It may not cover all possible information. If you have questions about this medicine, talk to your doctor, pharmacist, or health care provider.  2019 Elsevier/Gold Standard (2008-02-04 14:52:56)

## 2019-01-14 NOTE — Progress Notes (Signed)
[facility]  Sick Visit  Subjective:  Patient ID: Beth Hubbard, female    DOB: August 26, 1946  Age: 73 y.o. MRN: [id number]  CC:  Chief Complaint  Patient presents with  . Cough  . Nasal Congestion  . Insomnia  . Sore Throat   HPI Beth Hubbard is a 73 year old female who presents for Sick Visit today.   Past Medical History:  Diagnosis Date  . Diabetes mellitus without complication ([place])   . Excessive daytime sleepiness   . High cholesterol   . Hypertension   . Mitral regurgitation    mild by echo 03/2017  . OSA (obstructive sleep apnea) 10/18/2015   Mild OSA with AHI 12.5/hr on CPAP at 8cm H2O  . Shingles   . TIA (transient ischemic attack)    Current Status: Ms. Timm is a patient of Venora Maples, NP in our office. She is here today for a sick visit. Since her last office visit, she has c/o chills, cough, body aches, shortness of breath X 6 days. She has been using Vicks Vapor Rub and Alcohol rub to aide in symptoms. She states that he cough has been keeping her awake. She has c/o flank pain caused by increased coughing.    She denies fevers, chills, fatigue, recent infections, weight loss, and night sweats. She has not had any headaches, visual changes, dizziness, and falls. No chest pain, heart palpitations,  and shortness of breath reported. No reports of GI problems such as nausea, vomiting, diarrhea, and constipation. She has no reports of blood in stools, dysuria and hematuria. No depression or anxiety reported.   Past Surgical History:  Procedure Laterality Date  . ABDOMINAL SURGERY    . ROTATOR CUFF REPAIR     bilateral  . TEE WITHOUT CARDIOVERSION N/A 07/20/2016   Procedure: TRANSESOPHAGEAL ECHOCARDIOGRAM (TEE);  Surgeon: Pixie Casino, MD;  Location: [facility] ENDOSCOPY;  Service: Cardiovascular;  Laterality: N/A;    Family History  Problem Relation Age of Onset  . Cancer Father   . Cancer Sister   . Cancer Brother      Social History   Socioeconomic History  . Marital status: Legally Separated    Spouse name: Not on file  . Number of children: Not on file  . Years of education: Not on file  . Highest education level: Not on file  Occupational History  . Not on file  Social Needs  . Financial resource strain: Not on file  . Food insecurity:    Worry: Not on file    Inability: Not on file  . Transportation needs:    Medical: Not on file    Non-medical: Not on file  Tobacco Use  . Smoking status: Never Smoker  . Smokeless tobacco: Never Used  Substance and Sexual Activity  . Alcohol use: No  . Drug use: No  . Sexual activity: Not on file  Lifestyle  . Physical activity:    Days per week: Not on file    Minutes per session: Not on file  . Stress: Not on file  Relationships  . Social connections:    Talks on phone: Not on file    Gets together: Not on file    Attends religious service: Not on file    Active member of club or organization: Not on file    Attends meetings of clubs or organizations: Not on file    Relationship status: Not on file  .  Intimate partner violence:    Fear of current or ex partner: Not on file    Emotionally abused: Not on file    Physically abused: Not on file    Forced sexual activity: Not on file  Other Topics Concern  . Not on file  Social History Narrative  . Not on file    Outpatient Medications Prior to Visit  Medication Sig Dispense Refill  . ACCU-CHEK AVIVA PLUS test strip Reported on 02/24/2016 100 each 1  . ACCU-CHEK SOFTCLIX LANCETS lancets Check BS once or twice a day. 100 each 1  . Blood Glucose Monitoring Suppl (ACCU-CHEK AVIVA PLUS) w/Device KIT 1 kit by Does not apply route as directed. Check 1 to 2 times daily 1 kit 0  . Calcium Carbonate-Vitamin D (CALCIUM 600/VITAMIN D) 600-400 MG-UNIT chew tablet Chew 1 tablet by mouth daily.    . COMBIGAN 0.2-0.5 % ophthalmic solution Place 1 drop into the left eye 2 (two) times daily.    .  cyclobenzaprine (FLEXERIL) 10 MG tablet TAKE 1 TABLET(10 MG) BY MOUTH THREE TIMES DAILY AS NEEDED FOR MUSCLE SPASMS 30 tablet 0  . ferrous sulfate 325 (65 FE) MG tablet Take 325 mg by mouth as directed.    . folic acid (FOLVITE) 416 MCG tablet Take 1 tablet (400 mcg total) by mouth daily. 90 tablet 3  . gabapentin (NEURONTIN) 300 MG capsule Take one cap po QHS 90 capsule 1  . HYDROcodone-acetaminophen (NORCO/VICODIN) 5-325 MG tablet Take 1 tablet by mouth as directed.  0  . sitaGLIPtin (JANUVIA) 50 MG tablet TAKE 1 TABLET(50 MG) BY MOUTH DAILY 90 tablet 3  . traZODone (DESYREL) 50 MG tablet TAKE 1 TABLET BY MOUTH AT BEDTIME IF NEEDED FOR SLEEP 90 tablet 1  . amLODipine (NORVASC) 5 MG tablet TAKE 1 TABLET(5 MG) BY MOUTH DAILY 90 tablet 3  . rosuvastatin (CRESTOR) 20 MG tablet Take 1 tablet (20 mg total) by mouth daily. 90 tablet 3  . triamcinolone cream (KENALOG) 0.1 % Apply 1 application topically 2 (two) times daily. (Patient not taking: Reported on 08/15/2018) 30 g 2   No facility-administered medications prior to visit.     Allergies  Allergen Reactions  . Aspirin     Tremor   . Penicillins Itching and Other (See Comments)    Reaction: itching,headache, dizziness and anxiety. Feels like she is "goin off"  . Nsaids Anxiety    Reaction: causes dizziness and headache. Ibuprofen. Aspirin    ROS Review of Systems  Constitutional: Positive for chills.  HENT: Negative.   Eyes: Negative.   Respiratory: Positive for cough and shortness of breath.   Cardiovascular: Negative.   Gastrointestinal: Negative.   Endocrine: Negative.   Genitourinary: Negative.   Musculoskeletal: Negative.        Body Aches  Skin: Negative.   Allergic/Immunologic: Negative.   Neurological: Negative.   Hematological: Negative.   Psychiatric/Behavioral: Negative.    Objective:    Physical Exam  Constitutional: She is oriented to person, place, and time. She appears well-developed and well-nourished.    HENT:  Head: Normocephalic and atraumatic.  Eyes: Conjunctivae are normal.  Neck: Normal range of motion. Neck supple.  Cardiovascular: Normal rate, regular rhythm, normal heart sounds and intact distal pulses.  Pulmonary/Chest: Effort normal and breath sounds normal.  Abdominal: Soft. Bowel sounds are normal. She exhibits distension.  Musculoskeletal: Normal range of motion.  Neurological: She is oriented to person, place, and time. She has normal reflexes.  Skin: Skin is  warm and dry.  Psychiatric: She has a normal mood and affect. Her behavior is normal. Judgment and thought content normal.  Nursing note and vitals reviewed.   BP 118/86 (BP Location: Right Arm, Patient Position: Sitting, Cuff Size: Large)   Pulse 94   Temp 98.4 F (36.9 C) (Oral)   Ht 5' 2"  (1.575 m)   Wt 174 lb (78.9 kg)   SpO2 96%   BMI 31.83 kg/m  Wt Readings from Last 3 Encounters:  01/14/19 174 lb (78.9 kg)  08/15/18 167 lb (75.8 kg)  05/14/18 165 lb 6.4 oz (75 kg)     Health Maintenance Due  Topic Date Due  . URINE MICROALBUMIN  06/28/2018  . OPHTHALMOLOGY EXAM  09/04/2018    There are no preventive care reminders to display for this patient.  Lab Results  Component Value Date   TSH 1.27 03/05/2016   Lab Results  Component Value Date   WBC 6.9 06/28/2017   HGB 11.2 (L) 06/28/2017   HCT 35.1 06/28/2017   MCV 97.5 06/28/2017   PLT 242 06/28/2017   Lab Results  Component Value Date   NA 141 04/11/2018   K 3.5 04/11/2018   CO2 29 04/11/2018   GLUCOSE 88 04/11/2018   BUN 11 04/11/2018   CREATININE 1.07 (H) 04/11/2018   BILITOT 0.3 10/02/2017   ALKPHOS 54 06/28/2017   AST 25 10/02/2017   ALT 21 10/02/2017   PROT 7.3 10/02/2017   ALBUMIN 4.2 06/28/2017   CALCIUM 9.5 04/11/2018   Lab Results  Component Value Date   CHOL 192 04/08/2017   Lab Results  Component Value Date   HDL 72 04/08/2017   Lab Results  Component Value Date   LDLCALC 93 04/08/2017   Lab Results   Component Value Date   TRIG 136 04/08/2017   Lab Results  Component Value Date   CHOLHDL 2.7 04/08/2017   Lab Results  Component Value Date   HGBA1C 6.0 (A) 08/15/2018   Assessment & Plan:   1. Flu-like symptoms Results are negative for Influenza. Begin OTC Cold/Flu medication, increase fluid intake, and get plenty of rest.  - POCT Influenza A/B  2. Sore throat Results are negative for strep. Use throat lozenges as needed.  - POCT rapid strep A - Culture, Group A Strep  3. Cough We will initiate Benzonatate today.  - benzonatate (TESSALON) 100 MG capsule; Take 1 capsule (100 mg total) by mouth 2 (two) times daily as needed for cough.  Dispense: 20 capsule; Refill: 0  4. Essential hypertension Blood pressure is stable today. Blood pressure is 118/86 today. She will continue to decrease high sodium intake, excessive alcohol intake, increase potassium intake, smoking cessation, and increase physical activity of at least 30 minutes of cardio activity daily. She will continue to follow Heart Healthy or DASH diet. - amLODipine (NORVASC) 5 MG tablet; TAKE 1 TABLET(5 MG) BY MOUTH DAILY  Dispense: 90 tablet; Refill: 3  5. Hyperlipidemia, unspecified hyperlipidemia type - rosuvastatin (CRESTOR) 20 MG tablet; Take 1 tablet (20 mg total) by mouth daily.  Dispense: 90 tablet; Refill: 3  6. Follow up She will keep previously scheduled follow up appointment with Venora Maples, NP.   Meds ordered this encounter  Medications  . amLODipine (NORVASC) 5 MG tablet    Sig: TAKE 1 TABLET(5 MG) BY MOUTH DAILY    Dispense:  90 tablet    Refill:  3    **Patient requests 90 days supply**  . benzonatate (TESSALON) 100  MG capsule    Sig: Take 1 capsule (100 mg total) by mouth 2 (two) times daily as needed for cough.    Dispense:  20 capsule    Refill:  0  . rosuvastatin (CRESTOR) 20 MG tablet    Sig: Take 1 tablet (20 mg total) by mouth daily.    Dispense:  90 tablet    Refill:  3    Orders Placed  This Encounter  Procedures  . Culture, Group A Strep  . POCT rapid strep A  . POCT Influenza A/B    Referral Orders  No referral(s) requested today    Kathe Becton,  MSN, FNP-C Patient [place], [place] [address] [phone]  Problem List Items Addressed This Visit      Cardiovascular and Mediastinum   Hypertension   Relevant Medications   amLODipine (NORVASC) 5 MG tablet   rosuvastatin (CRESTOR) 20 MG tablet    Other Visit Diagnoses    Flu-like symptoms    -  Primary   Relevant Orders   POCT Influenza A/B (Completed)   Sore throat       Relevant Orders   POCT rapid strep A (Completed)   Culture, Group A Strep   Cough       Relevant Medications   benzonatate (TESSALON) 100 MG capsule   Hyperlipidemia, unspecified hyperlipidemia type       Relevant Medications   amLODipine (NORVASC) 5 MG tablet   rosuvastatin (CRESTOR) 20 MG tablet   Follow up          Meds ordered this encounter  Medications  . amLODipine (NORVASC) 5 MG tablet    Sig: TAKE 1 TABLET(5 MG) BY MOUTH DAILY    Dispense:  90 tablet    Refill:  3    **Patient requests 90 days supply**  . benzonatate (TESSALON) 100 MG capsule    Sig: Take 1 capsule (100 mg total) by mouth 2 (two) times daily as needed for cough.    Dispense:  20 capsule    Refill:  0  . rosuvastatin (CRESTOR) 20 MG tablet    Sig: Take 1 tablet (20 mg total) by mouth daily.    Dispense:  90 tablet    Refill:  3    Follow-up: No follow-ups on file.    Azzie Glatter, FNP

## 2019-01-15 ENCOUNTER — Ambulatory Visit: Payer: Medicare Other | Admitting: Family Medicine

## 2019-01-17 LAB — CULTURE, GROUP A STREP: Strep A Culture: NEGATIVE

## 2019-02-13 ENCOUNTER — Ambulatory Visit: Payer: Medicare Other | Admitting: Family Medicine

## 2019-05-08 ENCOUNTER — Telehealth: Payer: Self-pay

## 2019-05-08 NOTE — Telephone Encounter (Signed)
Called, no answer. Left a message to call back.  

## 2019-05-21 ENCOUNTER — Telehealth: Payer: Self-pay

## 2019-05-21 DIAGNOSIS — G4700 Insomnia, unspecified: Secondary | ICD-10-CM

## 2019-05-21 DIAGNOSIS — I10 Essential (primary) hypertension: Secondary | ICD-10-CM

## 2019-05-21 MED ORDER — TRAZODONE HCL 50 MG PO TABS: ORAL_TABLET | ORAL | 1 refills | Status: DC

## 2019-05-21 MED ORDER — AMLODIPINE BESYLATE 5 MG PO TABS: ORAL_TABLET | ORAL | 3 refills | Status: DC

## 2019-05-21 NOTE — Telephone Encounter (Signed)
These have been sent into pharmacy.

## 2019-05-29 ENCOUNTER — Ambulatory Visit: Payer: Medicare Other | Admitting: Family Medicine

## 2019-06-19 ENCOUNTER — Ambulatory Visit: Payer: Medicare Other | Admitting: Family Medicine

## 2019-07-03 ENCOUNTER — Ambulatory Visit (INDEPENDENT_AMBULATORY_CARE_PROVIDER_SITE_OTHER): Payer: Medicare Other | Admitting: Family Medicine

## 2019-07-03 ENCOUNTER — Ambulatory Visit: Payer: Medicare Other | Admitting: Family Medicine

## 2019-07-03 ENCOUNTER — Other Ambulatory Visit: Payer: Self-pay

## 2019-07-03 ENCOUNTER — Encounter: Payer: Self-pay | Admitting: Family Medicine

## 2019-07-03 VITALS — BP 124/58 | HR 80 | Temp 98.9°F | Resp 16 | Ht 62.0 in | Wt 167.0 lb

## 2019-07-03 DIAGNOSIS — I10 Essential (primary) hypertension: Secondary | ICD-10-CM

## 2019-07-03 DIAGNOSIS — E119 Type 2 diabetes mellitus without complications: Secondary | ICD-10-CM | POA: Diagnosis not present

## 2019-07-03 DIAGNOSIS — R8271 Bacteriuria: Secondary | ICD-10-CM

## 2019-07-03 DIAGNOSIS — R8290 Unspecified abnormal findings in urine: Secondary | ICD-10-CM

## 2019-07-03 DIAGNOSIS — G4700 Insomnia, unspecified: Secondary | ICD-10-CM | POA: Diagnosis not present

## 2019-07-03 LAB — POCT URINALYSIS DIPSTICK
Bilirubin, UA: NEGATIVE
Glucose, UA: NEGATIVE
Ketones, UA: NEGATIVE
Nitrite, UA: NEGATIVE
Protein, UA: NEGATIVE
Spec Grav, UA: 1.01 (ref 1.010–1.025)
Urobilinogen, UA: 0.2 E.U./dL
pH, UA: 6 (ref 5.0–8.0)

## 2019-07-03 LAB — POCT GLYCOSYLATED HEMOGLOBIN (HGB A1C): Hemoglobin A1C: 6.5 % — AB (ref 4.0–5.6)

## 2019-07-03 MED ORDER — AMLODIPINE BESYLATE 5 MG PO TABS: ORAL_TABLET | ORAL | 3 refills | Status: DC

## 2019-07-03 MED ORDER — TRAZODONE HCL 50 MG PO TABS: ORAL_TABLET | ORAL | 1 refills | Status: DC

## 2019-07-03 NOTE — Progress Notes (Signed)
Patient [facility]   Progress Note: General Provider: Lanae Boast, FNP  SUBJECTIVE:   Beth Hubbard is a 73 y.o. female who  has a past medical history of Diabetes mellitus without complication ([place]), Excessive daytime sleepiness, High cholesterol, Hypertension, Mitral regurgitation, OSA (obstructive sleep apnea) (10/18/2015), Shingles, and TIA (transient ischemic attack).. Patient presents today for Hypertension, Diabetes, and Edema (both knees ) Patient reports compliance with all medications.  Patient denies side effects of medications.  She states that she is followed by ortho Dr. Clarise Cruz and she has a MRI scheduled for the knee pain and swelling. She just wanted to make this clinic aware. Will request records.  She does not report checking her BP at home. No specific exercise regimen reported.   Review of Systems  Constitutional: Negative.   HENT: Negative.   Eyes: Negative.   Respiratory: Negative.   Cardiovascular: Negative.   Gastrointestinal: Negative.   Genitourinary: Negative.   Musculoskeletal: Negative.   Skin: Negative.   Neurological: Negative.   Psychiatric/Behavioral: Negative.      OBJECTIVE: BP (!) 124/58 (BP Location: Right Arm, Patient Position: Sitting, Cuff Size: Normal)   Pulse 80   Temp 98.9 F (37.2 C) (Oral)   Resp 16   Ht 5\' 2"  (1.575 m)   Wt 167 lb (75.8 kg)   SpO2 96%   BMI 30.54 kg/m   Wt Readings from Last 3 Encounters:  07/03/19 167 lb (75.8 kg)  01/14/19 174 lb (78.9 kg)  08/15/18 167 lb (75.8 kg)     Physical Exam Vitals signs and nursing note reviewed.  Constitutional:      General: She is not in acute distress.    Appearance: Normal appearance.  HENT:     Head: Normocephalic and atraumatic.  Eyes:     Extraocular Movements: Extraocular movements intact.     Conjunctiva/sclera: Conjunctivae normal.     Pupils: Pupils are equal, round, and reactive to light.  Cardiovascular:     Rate  and Rhythm: Normal rate and regular rhythm.     Heart sounds: No murmur.  Pulmonary:     Effort: Pulmonary effort is normal.     Breath sounds: Normal breath sounds.  Musculoskeletal: Normal range of motion.  Skin:    General: Skin is warm and dry.  Neurological:     Mental Status: She is alert and oriented to person, place, and time.  Psychiatric:        Mood and Affect: Mood normal.        Behavior: Behavior normal.        Thought Content: Thought content normal.        Judgment: Judgment normal.     ASSESSMENT/PLAN:   1. Essential hypertension - Urinalysis Dipstick - Microalbumin/Creatinine Ratio, Urine - amLODipine (NORVASC) 5 MG tablet; TAKE 1 TABLET(5 MG) BY MOUTH DAILY  Dispense: 90 tablet; Refill: 3 - CBC With Differential  2. Diabetes mellitus without complication (HCC) - HgB A1c - Lipid Panel - Comprehensive metabolic panel  3. Insomnia, unspecified type - traZODone (DESYREL) 50 MG tablet; TAKE 1 TABLET BY MOUTH AT BEDTIME IF NEEDED FOR SLEEP  Dispense: 90 tablet; Refill: 1  4. Abnormal urinalysis - Urine Culture   No medication changes noted. Will continue to monitor. Labs pending.   Return in about 6 months (around 01/03/2020) for htn.    The patient was given clear instructions to go to ER or return to medical center if symptoms do  not improve, worsen or new problems develop. The patient verbalized understanding and agreed with plan of care.   Ms. Doug Sou. Nathaneil Canary, FNP-BC Patient [facility] [address] [place], [place] [address] [phone]

## 2019-07-03 NOTE — Patient Instructions (Signed)

## 2019-07-04 LAB — COMPREHENSIVE METABOLIC PANEL
ALT: 14 IU/L (ref 0–32)
AST: 19 IU/L (ref 0–40)
Albumin/Globulin Ratio: 1.7 (ref 1.2–2.2)
Albumin: 4.6 g/dL (ref 3.7–4.7)
Alkaline Phosphatase: 59 IU/L (ref 39–117)
BUN/Creatinine Ratio: 10 — ABNORMAL LOW (ref 12–28)
BUN: 9 mg/dL (ref 8–27)
Bilirubin Total: 0.3 mg/dL (ref 0.0–1.2)
CO2: 29 mmol/L (ref 20–29)
Calcium: 9.7 mg/dL (ref 8.7–10.3)
Chloride: 98 mmol/L (ref 96–106)
Creatinine, Ser: 0.93 mg/dL (ref 0.57–1.00)
GFR calc Af Amer: 71 mL/min/{1.73_m2} (ref >59)
GFR calc non Af Amer: 61 mL/min/{1.73_m2} (ref >59)
Globulin, Total: 2.7 g/dL (ref 1.5–4.5)
Glucose: 105 mg/dL — ABNORMAL HIGH (ref 65–99)
Potassium: 3.5 mmol/L (ref 3.5–5.2)
Sodium: 142 mmol/L (ref 134–144)
Total Protein: 7.3 g/dL (ref 6.0–8.5)

## 2019-07-04 LAB — CBC WITH DIFFERENTIAL
Basophils Absolute: 0 10*3/uL (ref 0.0–0.2)
Basos: 0 %
EOS (ABSOLUTE): 0.2 10*3/uL (ref 0.0–0.4)
Eos: 3 %
Hematocrit: 35.9 % (ref 34.0–46.6)
Hemoglobin: 11.9 g/dL (ref 11.1–15.9)
Immature Grans (Abs): 0 10*3/uL (ref 0.0–0.1)
Immature Granulocytes: 0 %
Lymphocytes Absolute: 2 10*3/uL (ref 0.7–3.1)
Lymphs: 35 %
MCH: 31.3 pg (ref 26.6–33.0)
MCHC: 33.1 g/dL (ref 31.5–35.7)
MCV: 95 fL (ref 79–97)
Monocytes Absolute: 0.5 10*3/uL (ref 0.1–0.9)
Monocytes: 8 %
Neutrophils Absolute: 3.2 10*3/uL (ref 1.4–7.0)
Neutrophils: 54 %
RBC: 3.8 x10E6/uL (ref 3.77–5.28)
RDW: 13.1 % (ref 11.7–15.4)
WBC: 5.9 10*3/uL (ref 3.4–10.8)

## 2019-07-04 LAB — LIPID PANEL
Chol/HDL Ratio: 3.4 ratio (ref 0.0–4.4)
Cholesterol, Total: 172 mg/dL (ref 100–199)
HDL: 51 mg/dL (ref >39)
LDL Calculated: 83 mg/dL (ref 0–99)
Triglycerides: 190 mg/dL — ABNORMAL HIGH (ref 0–149)
VLDL Cholesterol Cal: 38 mg/dL (ref 5–40)

## 2019-07-04 LAB — MICROALBUMIN / CREATININE URINE RATIO
Creatinine, Urine: 108.3 mg/dL
Microalb/Creat Ratio: 18 mg/g creat (ref 0–29)
Microalbumin, Urine: 19.9 ug/mL

## 2019-07-05 LAB — URINE CULTURE

## 2019-07-06 MED ORDER — CLINDAMYCIN HCL 300 MG PO CAPS: 300.0000 mg | ORAL_CAPSULE | Freq: Three times a day (TID) | ORAL | 0 refills | Status: AC

## 2019-07-06 NOTE — Addendum Note (Signed)
Addended by: Genelle Bal on: 07/06/2019 12:21 PM   Modules accepted: Orders

## 2019-07-07 ENCOUNTER — Telehealth: Payer: Self-pay | Admitting: Family Medicine

## 2019-07-07 NOTE — Telephone Encounter (Signed)
Beth Hubbard,  Will you please advise on labs? Thanks!

## 2019-07-08 NOTE — Telephone Encounter (Signed)
Called and spoke with patient advised that urine shows UTI and that antibiotic has been sent to pharmacy. Advised that all other labs were stable. Thanks!

## 2019-07-08 NOTE — Telephone Encounter (Signed)
I sent in medication for a UTI. Not sure if you saw that message. All other labs were stable. No medication changes.

## 2019-07-13 ENCOUNTER — Other Ambulatory Visit: Payer: Self-pay | Admitting: Family Medicine

## 2019-07-13 DIAGNOSIS — Z1231 Encounter for screening mammogram for malignant neoplasm of breast: Secondary | ICD-10-CM

## 2019-07-21 ENCOUNTER — Other Ambulatory Visit: Payer: Self-pay

## 2019-07-21 ENCOUNTER — Ambulatory Visit: Payer: Medicare Other | Attending: Neurosurgery

## 2019-07-21 DIAGNOSIS — G8929 Other chronic pain: Secondary | ICD-10-CM | POA: Insufficient documentation

## 2019-07-21 DIAGNOSIS — M25612 Stiffness of left shoulder, not elsewhere classified: Secondary | ICD-10-CM | POA: Diagnosis present

## 2019-07-21 DIAGNOSIS — M25512 Pain in left shoulder: Secondary | ICD-10-CM | POA: Diagnosis present

## 2019-07-21 DIAGNOSIS — R296 Repeated falls: Secondary | ICD-10-CM | POA: Insufficient documentation

## 2019-07-21 DIAGNOSIS — M4802 Spinal stenosis, cervical region: Secondary | ICD-10-CM | POA: Diagnosis present

## 2019-07-21 DIAGNOSIS — M542 Cervicalgia: Secondary | ICD-10-CM | POA: Diagnosis present

## 2019-07-21 DIAGNOSIS — M6281 Muscle weakness (generalized): Secondary | ICD-10-CM | POA: Insufficient documentation

## 2019-07-21 DIAGNOSIS — M79602 Pain in left arm: Secondary | ICD-10-CM | POA: Diagnosis present

## 2019-07-21 NOTE — Therapy (Signed)
[place], [place], [address] Phone: [phone]   Fax:  [phone]  Physical Therapy Evaluation  Patient Details  Name: CHASNEY WINSTEL MRN: [id number] Date of Birth: 20-Nov-1945 Referring Provider (PT): Simeon Craft, [facility]   Encounter Date: 07/21/2019  PT End of Session - 07/21/19 0709    Visit Number  1    Number of Visits  12    Date for PT Re-Evaluation  09/04/19    Authorization Type  UHC MCR    PT Start Time  0702    PT Stop Time  0745    PT Time Calculation (min)  43 min    Activity Tolerance  Patient tolerated treatment well    Behavior During Therapy  [facility] for tasks assessed/performed       Past Medical History:  Diagnosis Date  . Diabetes mellitus without complication ([place])   . Excessive daytime sleepiness   . High cholesterol   . Hypertension   . Mitral regurgitation    mild by echo 03/2017  . OSA (obstructive sleep apnea) 10/18/2015   Mild OSA with AHI 12.5/hr on CPAP at 8cm H2O  . Shingles   . TIA (transient ischemic attack)     Past Surgical History:  Procedure Laterality Date  . ABDOMINAL SURGERY    . ROTATOR CUFF REPAIR     bilateral  . TEE WITHOUT CARDIOVERSION N/A 07/20/2016   Procedure: TRANSESOPHAGEAL ECHOCARDIOGRAM (TEE);  Surgeon: Pixie Casino, MD;  Location: [facility] ENDOSCOPY;  Service: Cardiovascular;  Laterality: N/A;    There were no vitals filed for this visit.   Subjective Assessment - 07/21/19 0713    Subjective  She report Lt neck and arm pain.  She has been to PT many times without bnefit.  She reports doing exercises.  She reports the more she uses arm for activity or exercise her pain increases., She uses heat at home. She feels her main problem may be her shoulder and not her neck.    Limitations  Lifting;House hold activities    Diagnostic tests  MRI; stenosis    Patient Stated Goals  Decr pain. Improve use of her LT arm    Pain Score  5     Pain Location   Shoulder    Pain Descriptors / Indicators  Aching;Stabbing    Pain Type  Chronic pain    Pain Onset  More than a month ago    Pain Frequency  Constant         [facility] PT Assessment - 07/21/19 0001      Assessment   Medical Diagnosis  Cervical stenosis    Referring Provider (PT)  Simeon Craft, [facility]    Onset Date/Surgical Date  --   years ago   Next MD Visit  Sept 29, 2020    Prior Therapy  Yes more than once and last here 2019      Precautions   Precautions  None      Restrictions   Weight Bearing Restrictions  No      Balance Screen   Has the patient fallen in the past 6 months  No      [place] residence    Living Arrangements  Alone    Available Help at Discharge  Family    Type of [place]      Prior Function   Level of Independence  Needs assistance with homemaking  Cognition   Overall Cognitive Status  Within Functional Limits for tasks assessed      ROM / Strength   AROM / PROM / Strength  AROM;PROM;Strength      AROM   AROM Assessment Site  Cervical;Shoulder    Right/Left Shoulder  Left    Left Shoulder Flexion  62 Degrees    Left Shoulder ABduction  60 Degrees    Left Shoulder Internal Rotation  45 Degrees    Left Shoulder External Rotation  65 Degrees    Cervical Flexion  40    Cervical Extension  20    Cervical - Right Side Bend  20    Cervical - Left Side Bend  10    Cervical - Right Rotation  45    Cervical - Left Rotation  45      PROM   Overall PROM Comments  pain all direction LT shoulder .  decr Lt elboe ext by at least 30 degrees    PROM Assessment Site  Shoulder    Right/Left Shoulder  Left    Left Shoulder Extension  35 Degrees    Left Shoulder Flexion  100 Degrees    Left Shoulder ABduction  100 Degrees    Left Shoulder Internal Rotation  55 Degrees    Left Shoulder External Rotation  80 Degrees      Strength   Overall Strength Comments  pain all MMT LT shoulder       Ambulation/Gait    Gait Comments  Independent . Has SPC but does not use it                Objective measurements completed on examination: See above findings.              PT Education - 07/21/19 0717    Education Details  POC    Person(s) Educated  Patient    Methods  Explanation    Comprehension  Verbalized understanding       PT Short Term Goals - 07/21/19 0707      PT SHORT TERM GOAL #1   Title  She wil be indpendent with initial HEp    Time  2    Period  Weeks    Status  New        PT Long Term Goals - 07/21/19 0707      PT LONG TERM GOAL #1   Title  Pt will be able to perform HEP Independently     Time  4    Period  Weeks    Status  New      PT LONG TERM GOAL #2   Title  Patient will have 25% less pain with use of LUE for ADLs and home tasks.     Time  4    Period  Weeks    Status  New      PT LONG TERM GOAL #3   Title  Patient will be able to raise L arm to 100 deg for improved functional use of her arm in the home.     Time  4    Period  Weeks    Status  New      PT LONG TERM GOAL #4   Title  FOTO decr to 55% limited    Time  4    Period  Weeks    Status  New             Plan - 07/21/19 0710  Clinical Impression Statement  Ms Ruscitti presents with chronic pain in LT shoulder nad neck due to stnosis. She has been seen in PT in past without resolving her issues. 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 She may benefit from We will work with her until she returns to Dr  Cyndy Freeze  at end of September.    Personal Factors and Comorbidities  Past/Current Experience;Time since onset of injury/illness/exacerbation    Examination-Activity Limitations  Reach Overhead;Carry;Dressing;Hygiene/Grooming;Lift    Examination-Participation Restrictions  Community Activity;Other   heavier home tasks. Light home tasks with LT arm   Stability/Clinical Decision Making  Stable/Uncomplicated    Clinical Decision  Making  Low    Rehab Potential  Fair    PT Frequency  2x / week    PT Duration  4 weeks   then assess on return to MD if  improving   PT Treatment/Interventions  Taping;Patient/family education;Therapeutic activities;Therapeutic exercise;Electrical Stimulation;Moist Heat;Passive range of motion;Manual techniques    PT Next Visit Plan  initiate isometrics and gentle ROM for HEP. Manual and modalities for  pain    Consulted and Agree with Plan of Care  Patient       Patient will benefit from skilled therapeutic intervention in order to improve the following deficits and impairments:  Decreased range of motion, Impaired UE functional use, Increased muscle spasms, Pain, Decreased activity tolerance, Decreased strength  Visit Diagnosis: Muscle weakness (generalized)  Cervical stenosis of spine  Stiffness of left shoulder, not elsewhere classified  Chronic left shoulder pain     Problem List Patient Active Problem List   Diagnosis Date Noted  . OSA (obstructive sleep apnea) 10/18/2015  . Mitral regurgitation 05/18/2015  . Excessive daytime sleepiness 02/28/2015  . TIA (transient ischemic attack)   . Hypertension   . Diabetes mellitus without complication ([place])   . High cholesterol     Darrel Hoover  PT 07/21/2019, 7:57 AM  [facility] [address] [place], [place], [address] Phone: [phone]   Fax:  [phone]  Name: TIHESHA FRANKLYN MRN: [id number] Date of Birth: 09/21/1946

## 2019-07-29 ENCOUNTER — Encounter (HOSPITAL_COMMUNITY): Payer: Self-pay

## 2019-07-29 ENCOUNTER — Encounter (HOSPITAL_COMMUNITY): Payer: Self-pay | Admitting: *Deleted

## 2019-08-03 ENCOUNTER — Encounter: Payer: Self-pay | Admitting: Physical Therapy

## 2019-08-03 ENCOUNTER — Ambulatory Visit: Payer: Medicare Other | Admitting: Physical Therapy

## 2019-08-03 ENCOUNTER — Other Ambulatory Visit: Payer: Self-pay

## 2019-08-03 DIAGNOSIS — M25512 Pain in left shoulder: Secondary | ICD-10-CM

## 2019-08-03 DIAGNOSIS — G8929 Other chronic pain: Secondary | ICD-10-CM

## 2019-08-03 DIAGNOSIS — M4802 Spinal stenosis, cervical region: Secondary | ICD-10-CM

## 2019-08-03 DIAGNOSIS — M542 Cervicalgia: Secondary | ICD-10-CM

## 2019-08-03 DIAGNOSIS — M79602 Pain in left arm: Secondary | ICD-10-CM

## 2019-08-03 DIAGNOSIS — R296 Repeated falls: Secondary | ICD-10-CM

## 2019-08-03 DIAGNOSIS — M6281 Muscle weakness (generalized): Secondary | ICD-10-CM | POA: Diagnosis not present

## 2019-08-03 DIAGNOSIS — M25612 Stiffness of left shoulder, not elsewhere classified: Secondary | ICD-10-CM

## 2019-08-03 NOTE — Therapy (Addendum)
[place], [place], [address] Phone: [phone]   Fax:  [phone]  Physical Therapy Treatment/Discharge  Patient Details  Name: Beth Hubbard MRN: [id number] Date of Birth: [birth year] Referring Provider (PT): Simeon Craft, [facility]   Encounter Date: 08/03/2019  PT End of Session - 08/03/19 0938    Visit Number  2    Number of Visits  12    Date for PT Re-Evaluation  09/04/19    Authorization Type  UHC MCR    PT Start Time  0930    PT Stop Time  1008    PT Time Calculation (min)  38 min       Past Medical History:  Diagnosis Date  . Diabetes mellitus without complication ([place])   . Excessive daytime sleepiness   . High cholesterol   . Hypertension   . Mitral regurgitation    mild by echo 03/2017  . OSA (obstructive sleep apnea) 10/18/2015   Mild OSA with AHI 12.5/hr on CPAP at 8cm H2O  . Shingles   . TIA (transient ischemic attack)     Past Surgical History:  Procedure Laterality Date  . ABDOMINAL SURGERY    . ROTATOR CUFF REPAIR     bilateral  . TEE WITHOUT CARDIOVERSION N/A 07/20/2016   Procedure: TRANSESOPHAGEAL ECHOCARDIOGRAM (TEE);  Surgeon: Pixie Casino, MD;  Location: [facility] ENDOSCOPY;  Service: Cardiovascular;  Laterality: N/A;    There were no vitals filed for this visit.  Subjective Assessment - 08/03/19 0938    Currently in Pain?  Yes    Pain Location  Shoulder    Pain Orientation  Left    Pain Descriptors / Indicators  Aching;Stabbing                       [facility] Adult PT Treatment/Exercise - 08/03/19 0001      Self-Care   Self-Care  Other Self-Care Comments    Other Self-Care Comments   HEP explination , benefit      Neck Exercises: Seated   Other Seated Exercise  shoulder rolls, scapular squeezes     Other Seated Exercise  4 way isometrcis with self resist , into chair for extension       Neck Exercises: Supine   Other Supine Exercise  supine cane chest  press, clasped hand shoulder flexion better, cane horizontals too painful , ER AAROM with cane       Neck Exercises: Stretches   Other Neck Stretches  AROM side bend x 10 -painful to the right             PT Education - 08/03/19 1237    Education Details  HEP    Person(s) Educated  Patient    Methods  Explanation;Handout    Comprehension  Verbalized understanding       PT Short Term Goals - 07/21/19 0707      PT SHORT TERM GOAL #1   Title  She wil be indpendent with initial HEp    Time  2    Period  Weeks    Status  New        PT Long Term Goals - 07/21/19 0707      PT LONG TERM GOAL #1   Title  Pt will be able to perform HEP Independently     Time  4    Period  Weeks    Status  New      PT  LONG TERM GOAL #2   Title  Patient will have 25% less pain with use of LUE for ADLs and home tasks.     Time  4    Period  Weeks    Status  New      PT LONG TERM GOAL #3   Title  Patient will be able to raise L arm to 100 deg for improved functional use of her arm in the home.     Time  4    Period  Weeks    Status  New      PT LONG TERM GOAL #4   Title  FOTO decr to 55% limited    Time  4    Period  Weeks    Status  New            Plan - 08/03/19 1011    Clinical Impression Statement  Ms Harshberger arrives and requests to cancel all remaining appointments until she follows up with MD. Lovey Newcomer HEP today. She had increased shoulder pain with AAROM and increased neck and left shoulder pain with cervcal side bend. Asked her to perform HEP until MD appointment. She may return after seeing MD.    PT Home Exercise Plan  seated self isometrics, scap squeezes, shoulder rolls, supine clasped hand flexion AAROM       Patient will benefit from skilled therapeutic intervention in order to improve the following deficits and impairments:  Decreased range of motion, Impaired UE functional use, Increased muscle spasms, Pain, Decreased activity tolerance, Decreased  strength  Visit Diagnosis: Muscle weakness (generalized)  Cervical stenosis of spine  Stiffness of left shoulder, not elsewhere classified  Chronic left shoulder pain  Pain in left arm  Repeated falls  Cervicalgia     Problem List Patient Active Problem List   Diagnosis Date Noted  . OSA (obstructive sleep apnea) 10/18/2015  . Mitral regurgitation 05/18/2015  . Excessive daytime sleepiness 02/28/2015  . TIA (transient ischemic attack)   . Hypertension   . Diabetes mellitus without complication ([place])   . High cholesterol     Dorene Ar, [place] 08/03/2019, 12:37 PM  [facility] [address] [place], [place], [address] Phone: [phone]   Fax:  [phone]  Name: LANELLE LINDO MRN: [id number] Date of Birth: 1946-08-29  PHYSICAL THERAPY DISCHARGE SUMMARY  Visits from Start of Care: 2  Current functional level related to goals / functional outcomes: See above  She called and canceled all appointments stating she was returning to MD for further assessment.   Remaining deficits: Unknown   Education / Equipment: HEP Plan: Patient agrees to discharge.  Patient goals were not met. Patient is being discharged due to the patient's request.  ?????    Pearson Forster PT   10/27/19

## 2019-08-05 ENCOUNTER — Ambulatory Visit: Payer: Medicare Other | Admitting: Physical Therapy

## 2019-08-12 ENCOUNTER — Ambulatory Visit: Payer: Medicare Other | Admitting: Physical Therapy

## 2019-08-13 ENCOUNTER — Telehealth: Payer: Self-pay | Admitting: *Deleted

## 2019-08-13 NOTE — Telephone Encounter (Signed)

## 2019-08-17 ENCOUNTER — Ambulatory Visit: Payer: Medicare Other

## 2019-08-19 ENCOUNTER — Encounter: Payer: Medicare Other | Admitting: Physical Therapy

## 2019-08-21 ENCOUNTER — Telehealth: Payer: Medicare Other | Admitting: Cardiology

## 2019-08-21 ENCOUNTER — Other Ambulatory Visit: Payer: Self-pay

## 2019-08-21 NOTE — Progress Notes (Deleted)
Virtual Visit via Video Note   This visit type was conducted due to national recommendations for restrictions regarding the COVID-19 Pandemic (e.g. social distancing) in an effort to limit this patient's exposure and mitigate transmission in our community.  Due to her co-morbid illnesses, this patient is at least at moderate risk for complications without adequate follow up.  This format is felt to be most appropriate for this patient at this time.  All issues noted in this document were discussed and addressed.  A limited physical exam was performed with this format.  Please refer to the patient's chart for her consent to telehealth for [facility].  Evaluation Performed:  Follow-up visit  This visit type was conducted due to national recommendations for restrictions regarding the COVID-19 Pandemic (e.g. social distancing).  This format is felt to be most appropriate for this patient at this time.  All issues noted in this document were discussed and addressed.  No physical exam was performed (except for noted visual exam findings with Video Visits).  Please refer to the patient's chart (MyChart message for video visits and phone note for telephone visits) for the patient's consent to telehealth for [facility].  Date:  08/21/2019   ID:  [place], [place] [birth year], MRN [id number]  Patient Location:  Home  Provider location:   [place]  PCP:  Lanae Boast, [place]  Cardiologist:  Fransico Him, MD Electrophysiologist:  None   Chief Complaint:  HTN, OSA  History of Present Illness:    Beth Hubbard is a 73 y.o. female who presents via audio/video conferencing for a telehealth visit today.    Ashyah Lenze Caetano is a 73 y.o. female with a hx of mild OSA with an AHI of 11.9/hr with oxygen desaturations as low as 83% and is on CPAP at15cm H2O.  She is doing well with her CPAP device and thinks that she has gotten used to it.  She tolerates the mask and feels the pressure is adequate.   Since going on CPAP she feels rested in the am and has no significant daytime sleepiness.  She denies any significant mouth or nasal dryness or nasal congestion.  She does not think that he snores.    The patient does not have symptoms concerning for COVID-19 infection (fever, chills, cough, or new shortness of breath).    Prior CV studies:   The following studies were reviewed today:  PAP compliance  Past Medical History:  Diagnosis Date  . Diabetes mellitus without complication ([place])   . Excessive daytime sleepiness   . High cholesterol   . Hypertension   . Mitral regurgitation    mild by echo 03/2017  . OSA (obstructive sleep apnea) 10/18/2015   Mild OSA with AHI 12.5/hr on CPAP at 8cm H2O  . Shingles   . TIA (transient ischemic attack)    Past Surgical History:  Procedure Laterality Date  . ABDOMINAL SURGERY    . ROTATOR CUFF REPAIR     bilateral  . TEE WITHOUT CARDIOVERSION N/A 07/20/2016   Procedure: TRANSESOPHAGEAL ECHOCARDIOGRAM (TEE);  Surgeon: Pixie Casino, MD;  Location: [facility] ENDOSCOPY;  Service: Cardiovascular;  Laterality: N/A;     No outpatient medications have been marked as taking for the 08/21/19 encounter (Appointment) with Sueanne Margarita, MD.     Allergies:   Aspirin, Penicillins, and Nsaids   Social History   Tobacco Use  . Smoking status: Never Smoker  . Smokeless tobacco: Never Used  Substance Use Topics  .  Alcohol use: No  . Drug use: No     Family Hx: The patient's family history includes Cancer in her brother, father, and sister.  ROS:   Please see the history of present illness.     All other systems reviewed and are negative.   Labs/Other Tests and Data Reviewed:    Recent Labs: 07/03/2019: ALT 14; BUN 9; Creatinine, Ser 0.93; Hemoglobin 11.9; Potassium 3.5; Sodium 142   Recent Lipid Panel Lab Results  Component Value Date/Time   CHOL 172 07/03/2019 10:37 AM   TRIG 190 (H) 07/03/2019 10:37 AM   HDL 51 07/03/2019 10:37 AM    CHOLHDL 3.4 07/03/2019 10:37 AM   CHOLHDL 2.7 04/08/2017 09:09 AM   LDLCALC 83 07/03/2019 10:37 AM    Wt Readings from Last 3 Encounters:  07/03/19 167 lb (75.8 kg)  01/14/19 174 lb (78.9 kg)  08/15/18 167 lb (75.8 kg)     Objective:    Vital Signs:  There were no vitals taken for this visit.   CONSTITUTIONAL:  Well nourished, well developed female in no acute distress.  EYES: anicteric MOUTH: oral mucosa is pink RESPIRATORY: Normal respiratory effort, symmetric expansion CARDIOVASCULAR: No peripheral edema SKIN: No rash, lesions or ulcers MUSCULOSKELETAL: no digital cyanosis NEURO: Cranial Nerves II-XII grossly intact, moves all extremities PSYCH: Intact judgement and insight.  A&O x 3, Mood/affect appropriate   ASSESSMENT & PLAN:    1.  OSA -The patient is tolerating PAP therapy well without any problems. The PAP download was reviewed today and showed an AHI of ***/hr on *** cm H2O with ***% compliance in using more than 4 hours nightly.  The patient has been using and benefiting from PAP use and will continue to benefit from therapy.   2.  HTN -BP controlled -continue amlodipine 5mg  daily  3. Obesity -I have encouraged her to get into a routine exercise program and cut back on carbs and portions.   COVID-19 Education: The signs and symptoms of COVID-19 were discussed with the patient and how to seek care for testing (follow up with PCP or arrange E-visit).  The importance of social distancing was discussed today.  Patient Risk:   After full review of this patient's clinical status, I feel that they are at least moderate risk at this time.  Time:   Today, I have spent 20 minutes directly with the patient on telemedicine discussing medical problems including OSA, HTN.  We also reviewed the symptoms of COVID 19 and the ways to protect against contracting the virus with telehealth technology.  I spent an additional 5 minutes reviewing patient's chart including PAP  compliance download.  Medication Adjustments/Labs and Tests Ordered: Current medicines are reviewed at length with the patient today.  Concerns regarding medicines are outlined above.  Tests Ordered: No orders of the defined types were placed in this encounter.  Medication Changes: No orders of the defined types were placed in this encounter.   Disposition:  Follow up in 1 year(s)  Signed, Fransico Him, MD  08/21/2019 7:36 AM    [facility]

## 2019-08-24 ENCOUNTER — Ambulatory Visit (INDEPENDENT_AMBULATORY_CARE_PROVIDER_SITE_OTHER): Payer: Medicare Other | Admitting: Cardiology

## 2019-08-24 ENCOUNTER — Telehealth: Payer: Self-pay | Admitting: *Deleted

## 2019-08-24 ENCOUNTER — Other Ambulatory Visit: Payer: Self-pay

## 2019-08-24 VITALS — BP 160/72 | Ht 62.0 in | Wt 167.0 lb

## 2019-08-24 DIAGNOSIS — I10 Essential (primary) hypertension: Secondary | ICD-10-CM

## 2019-08-24 DIAGNOSIS — E669 Obesity, unspecified: Secondary | ICD-10-CM

## 2019-08-24 DIAGNOSIS — G4733 Obstructive sleep apnea (adult) (pediatric): Secondary | ICD-10-CM

## 2019-08-24 NOTE — Telephone Encounter (Signed)
-----   Message from Sueanne Margarita, MD sent at 08/24/2019  8:44 AM EDT ----- Please change auto CPAP to 4-12cm H2O and get a download in 4 weeks.  Please call DME and tell them that she needs a new mask ASAP because hers is torn. Apparently they told her she could not have a new mask before December but she cannot use the mask she has and if she does not use her device she will lose it.

## 2019-08-24 NOTE — Patient Instructions (Addendum)
Medication Instructions:   If you need a refill on your cardiac medications before your next appointment, please call your pharmacy.   Lab work:  If you have labs (blood work) drawn today and your tests are completely normal, you will receive your results only by: Marland Kitchen MyChart Message (if you have MyChart) OR . A paper copy in the mail If you have any lab test that is abnormal or we need to change your treatment, we will call you to review the results.  Testing/Procedures: None ordered today  Follow-Up: At [facility], you and your health needs are our priority.  As part of our continuing mission to provide you with exceptional heart care, we have created designated Provider Care Teams.  These Care Teams include your primary Cardiologist (physician) and Advanced Practice Providers (APPs -  Physician Assistants and Nurse Practitioners) who all work together to provide you with the care you need, when you need it. You will need a follow up appointment in 12 months.  Please call our office 2 months in advance to schedule this appointment.  You may see Dr. Radford Pax or one of the following Advanced Practice Providers on your designated Care Team:   [place], PA-C Melina Copa, PA-C . Ermalinda Barrios, PA-C  Please check your Blood Pressure once a day for one week and call our office with results. [phone]

## 2019-08-24 NOTE — Progress Notes (Signed)
Cardiology Office Note:    Date:  08/24/2019   ID:  Beth Hubbard, Beth Hubbard 1946-10-03, MRN [id number]  PCP:  Lanae Boast, [place]  Cardiologist:  No primary care provider on file.    Referring MD: Lanae Boast, FNP   Chief Complaint  Patient presents with  . Sleep Apnea  . Hypertension    History of Present Illness:    Beth Hubbard is a 73 y.o. female with a hx ofmild OSA with an AHI of 11.9/hr with oxygen desaturations as low as 83% and is on CPAP at15cm H2O. She is doing well with her CPAP device and thinks that she has gotten used to it.  She tolerates the mask but says that it has a tear around the nose and she is having problems using it.  Apparently was told by her DME she could not get another mask until December.  She feels the pressure is is too high and would like it turned downed.  Since going on CPAP she feels rested in the am and has no significant daytime sleepiness.  She denies any significant mouth or nasal dryness or nasal congestion.  She does not think that he snores.     Past Medical History:  Diagnosis Date  . Diabetes mellitus without complication ([place])   . Excessive daytime sleepiness   . High cholesterol   . Hypertension   . Mitral regurgitation    mild by echo 03/2017  . OSA (obstructive sleep apnea) 10/18/2015   Mild OSA with AHI 12.5/hr on CPAP at 8cm H2O  . Shingles   . TIA (transient ischemic attack)     Past Surgical History:  Procedure Laterality Date  . ABDOMINAL SURGERY    . ROTATOR CUFF REPAIR     bilateral  . TEE WITHOUT CARDIOVERSION N/A 07/20/2016   Procedure: TRANSESOPHAGEAL ECHOCARDIOGRAM (TEE);  Surgeon: Pixie Casino, MD;  Location: [facility] ENDOSCOPY;  Service: Cardiovascular;  Laterality: N/A;    Current Medications: Current Meds  Medication Sig  . ACCU-CHEK AVIVA PLUS test strip Reported on 02/24/2016  . ACCU-CHEK SOFTCLIX LANCETS lancets Check BS once or twice a day.  Marland Kitchen amLODipine (NORVASC) 5 MG tablet TAKE 1 TABLET(5 MG) BY MOUTH  DAILY  . Blood Glucose Monitoring Suppl (ACCU-CHEK AVIVA PLUS) w/Device KIT 1 kit by Does not apply route as directed. Check 1 to 2 times daily  . Calcium Carbonate-Vitamin D (CALCIUM 600/VITAMIN D) 600-400 MG-UNIT chew tablet Chew 1 tablet by mouth daily.  . clindamycin (CLEOCIN) 300 MG capsule TK 1 C PO Q 8 H  . COMBIGAN 0.2-0.5 % ophthalmic solution Place 1 drop into the left eye 2 (two) times daily.  . cyclobenzaprine (FLEXERIL) 10 MG tablet TAKE 1 TABLET(10 MG) BY MOUTH THREE TIMES DAILY AS NEEDED FOR MUSCLE SPASMS  . cycloSPORINE (RESTASIS) 0.05 % ophthalmic emulsion 1 drop 2 (two) times daily.  . dorzolamide (TRUSOPT) 2 % ophthalmic solution INT 1 GTT IN OU BID  . ferrous sulfate 325 (65 FE) MG tablet Take 325 mg by mouth as directed.  . folic acid (FOLVITE) 656 MCG tablet Take 1 tablet (400 mcg total) by mouth daily.  Marland Kitchen gabapentin (NEURONTIN) 300 MG capsule Take one cap po QHS  . HYDROcodone-acetaminophen (NORCO/VICODIN) 5-325 MG tablet Take 1 tablet by mouth as directed.  Marland Kitchen LUMIGAN 0.01 % SOLN INT 1 GTT IN OU QHS  . rosuvastatin (CRESTOR) 20 MG tablet Take 1 tablet (20 mg total) by mouth daily.  . sitaGLIPtin (JANUVIA) 50 MG  tablet TAKE 1 TABLET(50 MG) BY MOUTH DAILY  . traZODone (DESYREL) 50 MG tablet TAKE 1 TABLET BY MOUTH AT BEDTIME IF NEEDED FOR SLEEP     Allergies:   Aspirin, Penicillins, and Nsaids   Social History   Socioeconomic History  . Marital status: Legally Separated    Spouse name: Not on file  . Number of children: Not on file  . Years of education: Not on file  . Highest education level: Not on file  Occupational History  . Not on file  Social Needs  . Financial resource strain: Not on file  . Food insecurity    Worry: Not on file    Inability: Not on file  . Transportation needs    Medical: Not on file    Non-medical: Not on file  Tobacco Use  . Smoking status: Never Smoker  . Smokeless tobacco: Never Used  Substance and Sexual Activity  . Alcohol  use: No  . Drug use: No  . Sexual activity: Not on file  Lifestyle  . Physical activity    Days per week: Not on file    Minutes per session: Not on file  . Stress: Not on file  Relationships  . Social Herbalist on phone: Not on file    Gets together: Not on file    Attends religious service: Not on file    Active member of club or organization: Not on file    Attends meetings of clubs or organizations: Not on file    Relationship status: Not on file  Other Topics Concern  . Not on file  Social History Narrative  . Not on file     Family History: The patient's family history includes Cancer in her brother, father, and sister.  ROS:   Please see the history of present illness.    ROS  All other systems reviewed and negative.   EKGs/Labs/Other Studies Reviewed:    The following studies were reviewed today: PAP compliance download  EKG:  EKG is not ordered today.   Recent Labs: 07/03/2019: ALT 14; BUN 9; Creatinine, Ser 0.93; Hemoglobin 11.9; Potassium 3.5; Sodium 142   Recent Lipid Panel    Component Value Date/Time   CHOL 172 07/03/2019 1037   TRIG 190 (H) 07/03/2019 1037   HDL 51 07/03/2019 1037   CHOLHDL 3.4 07/03/2019 1037   CHOLHDL 2.7 04/08/2017 0909   VLDL 27 04/08/2017 0909   LDLCALC 83 07/03/2019 1037    Physical Exam:    VS:  BP (!) 160/72   Ht 5' 2"  (1.575 m)   Wt 167 lb (75.8 kg)   BMI 30.54 kg/m     Wt Readings from Last 3 Encounters:  08/24/19 167 lb (75.8 kg)  07/03/19 167 lb (75.8 kg)  01/14/19 174 lb (78.9 kg)     GEN:  Well nourished, well developed in no acute distress HEENT: Normal NECK: No JVD; No carotid bruits LYMPHATICS: No lymphadenopathy CARDIAC: RRR, no murmurs, rubs, gallops RESPIRATORY:  Clear to auscultation without rales, wheezing or rhonchi  ABDOMEN: Soft, non-tender, non-distended MUSCULOSKELETAL:  No edema; No deformity  SKIN: Warm and dry NEUROLOGIC:  Alert and oriented x 3 PSYCHIATRIC:  Normal  affect   ASSESSMENT:    1. OSA (obstructive sleep apnea)   2. Essential hypertension   3. Obesity (BMI 30-39.9)    PLAN:    In order of problems listed above:  1.  OSA -  The patient is tolerating PAP  therapy well without any problems. The PAP download was reviewed today and showed an AHI of 1.4/hr on auto PAP  with 43% compliance in using more than 4 hours nightly.  The patient has been using and benefiting from PAP use and will continue to benefit from therapy. She feels the pressure is too high so I will decrease the auto settings to 4-12cm H2O and get a download in 4 weeks.  I will also order her a new full face mask since her is torn.  I have encouraged her to be more compliant with her device.  2.  HTN -BP borderline controlled on exam -continue amlodipine 44m daily -encouraged to follow < 2gm Na diet -check BP daily for a week and call with results  3.  Obesity -I have encouraged her to get into a routine exercise program and cut back on carbs and portions.    Medication Adjustments/Labs and Tests Ordered: Current medicines are reviewed at length with the patient today.  Concerns regarding medicines are outlined above.  No orders of the defined types were placed in this encounter.  No orders of the defined types were placed in this encounter.   Signed, TFransico Him MD  08/24/2019 8:50 AM    [place]

## 2019-08-24 NOTE — Telephone Encounter (Signed)
Order received from Dr Radford Pax to " Please change auto CPAP to 4-12cm H2O from 4-14 cm H20 and get a download in 4 weeks. Change was made via the modem by Gershon Cull.

## 2019-08-28 ENCOUNTER — Other Ambulatory Visit: Payer: Self-pay

## 2019-08-28 ENCOUNTER — Ambulatory Visit
Admission: RE | Admit: 2019-08-28 | Discharge: 2019-08-28 | Disposition: A | Discharge status: Home / Self Care | Payer: Medicare Other | Source: Ambulatory Visit | Attending: Family Medicine | Admitting: Family Medicine

## 2019-08-28 DIAGNOSIS — Z1231 Encounter for screening mammogram for malignant neoplasm of breast: Secondary | ICD-10-CM

## 2019-10-01 ENCOUNTER — Telehealth: Payer: Self-pay | Admitting: Cardiology

## 2019-10-01 NOTE — Telephone Encounter (Signed)
New Message:  Parke Simmers, daughter of the patient called and said that the patient is still having issues with her cpap mask. The patient wore it last night, but it did not work properly. The patient was told she would get new supplies every 3 months, but has not gotten anything recently. The patient needs a new mask   The daughter is at work and may not be able to answer her phone. If the office is unable to reach her, she asks that a detailed message is left on her phone with advice from the office

## 2019-10-02 NOTE — Telephone Encounter (Signed)
Called and lmtcb on daughter's phone to call [facility] for new supplies and encouraged her to call back with details on what is going on with her moms cpap so we can address the issue with Dr Radford Pax.

## 2019-10-05 NOTE — Telephone Encounter (Signed)
Called lmtcb on home phone.

## 2019-10-05 NOTE — Telephone Encounter (Signed)
Called [facility] Beth Hubbard) and they reached out to the patient to help trouble shoot her cpap issues.

## 2019-10-05 NOTE — Telephone Encounter (Signed)
Called lmtcb on cell. 

## 2019-10-06 ENCOUNTER — Other Ambulatory Visit: Payer: Self-pay | Admitting: Oral Surgery

## 2019-10-08 ENCOUNTER — Encounter (HOSPITAL_COMMUNITY): Payer: Self-pay | Admitting: *Deleted

## 2019-10-08 ENCOUNTER — Other Ambulatory Visit: Payer: Self-pay

## 2019-10-08 ENCOUNTER — Other Ambulatory Visit (HOSPITAL_COMMUNITY)
Admission: RE | Admit: 2019-10-08 | Discharge: 2019-10-08 | Disposition: A | Discharge status: Home / Self Care | Payer: Medicare Other | Source: Ambulatory Visit | Attending: Oral Surgery | Admitting: Oral Surgery

## 2019-10-08 DIAGNOSIS — Z20828 Contact with and (suspected) exposure to other viral communicable diseases: Secondary | ICD-10-CM | POA: Insufficient documentation

## 2019-10-08 DIAGNOSIS — Z01812 Encounter for preprocedural laboratory examination: Secondary | ICD-10-CM | POA: Diagnosis present

## 2019-10-08 LAB — SARS CORONAVIRUS 2 (TAT 6-24 HRS): SARS Coronavirus 2: NEGATIVE

## 2019-10-08 NOTE — Progress Notes (Signed)
Spoke with pt's daughter, Parke Simmers for pre-op call. DPR on file. She states pt has hx of Mitral valve regurgitation which she states is mild. Pt sees Dr. Fransico Him and last office visit was 08/24/19. Pt is a type 2 diabetic. Last A1C was 6.5 on 07/03/19. Pt is staying at daughter's house and forgot to bring her CBG meter. Instructed Parke Simmers to have pt NOT take her Januvia in the AM.   Pt had Covid test done this AM and Parke Simmers states pt is in quarantine now.

## 2019-10-09 ENCOUNTER — Other Ambulatory Visit: Payer: Self-pay

## 2019-10-09 ENCOUNTER — Ambulatory Visit (HOSPITAL_COMMUNITY): Payer: Medicare Other | Admitting: Registered Nurse

## 2019-10-09 ENCOUNTER — Encounter (HOSPITAL_COMMUNITY): Payer: Self-pay

## 2019-10-09 ENCOUNTER — Ambulatory Visit (HOSPITAL_COMMUNITY)
Admission: RE | Admit: 2019-10-09 | Discharge: 2019-10-09 | Disposition: A | Discharge status: Home / Self Care | Payer: Medicare Other | Attending: Oral Surgery | Admitting: Oral Surgery

## 2019-10-09 ENCOUNTER — Encounter (HOSPITAL_COMMUNITY)
Admission: RE | Disposition: A | Discharge status: Home / Self Care | Payer: Self-pay | Source: Home / Self Care | Attending: Oral Surgery

## 2019-10-09 DIAGNOSIS — K047 Periapical abscess without sinus: Secondary | ICD-10-CM | POA: Diagnosis not present

## 2019-10-09 DIAGNOSIS — H42 Glaucoma in diseases classified elsewhere: Secondary | ICD-10-CM | POA: Insufficient documentation

## 2019-10-09 DIAGNOSIS — E7800 Pure hypercholesterolemia, unspecified: Secondary | ICD-10-CM | POA: Diagnosis not present

## 2019-10-09 DIAGNOSIS — G4733 Obstructive sleep apnea (adult) (pediatric): Secondary | ICD-10-CM | POA: Diagnosis not present

## 2019-10-09 DIAGNOSIS — Z7984 Long term (current) use of oral hypoglycemic drugs: Secondary | ICD-10-CM | POA: Diagnosis not present

## 2019-10-09 DIAGNOSIS — Z8673 Personal history of transient ischemic attack (TIA), and cerebral infarction without residual deficits: Secondary | ICD-10-CM | POA: Insufficient documentation

## 2019-10-09 DIAGNOSIS — E1139 Type 2 diabetes mellitus with other diabetic ophthalmic complication: Secondary | ICD-10-CM | POA: Insufficient documentation

## 2019-10-09 DIAGNOSIS — I10 Essential (primary) hypertension: Secondary | ICD-10-CM | POA: Diagnosis not present

## 2019-10-09 DIAGNOSIS — Z79899 Other long term (current) drug therapy: Secondary | ICD-10-CM | POA: Diagnosis not present

## 2019-10-09 DIAGNOSIS — M1990 Unspecified osteoarthritis, unspecified site: Secondary | ICD-10-CM | POA: Insufficient documentation

## 2019-10-09 DIAGNOSIS — L03211 Cellulitis of face: Secondary | ICD-10-CM | POA: Diagnosis not present

## 2019-10-09 HISTORY — PX: TOOTH EXTRACTION: SHX859

## 2019-10-09 HISTORY — DX: Pneumonia, unspecified organism: J18.9

## 2019-10-09 HISTORY — DX: Unspecified osteoarthritis, unspecified site: M19.90

## 2019-10-09 HISTORY — DX: Unspecified glaucoma: H40.9

## 2019-10-09 LAB — BASIC METABOLIC PANEL
Anion gap: 11 (ref 5–15)
BUN: 12 mg/dL (ref 8–23)
CO2: 34 mmol/L — ABNORMAL HIGH (ref 22–32)
Calcium: 9.3 mg/dL (ref 8.9–10.3)
Chloride: 95 mmol/L — ABNORMAL LOW (ref 98–111)
Creatinine, Ser: 0.9 mg/dL (ref 0.44–1.00)
GFR calc Af Amer: >60 mL/min (ref >60)
GFR calc non Af Amer: >60 mL/min (ref >60)
Glucose, Bld: 116 mg/dL — ABNORMAL HIGH (ref 70–99)
Potassium: 4 mmol/L (ref 3.5–5.1)
Sodium: 140 mmol/L (ref 135–145)

## 2019-10-09 LAB — GLUCOSE, CAPILLARY
Glucose-Capillary: 102 mg/dL — ABNORMAL HIGH (ref 70–99)
Glucose-Capillary: 87 mg/dL (ref 70–99)
Glucose-Capillary: 120 mg/dL — ABNORMAL HIGH (ref 70–99)

## 2019-10-09 LAB — CBC
HCT: 35 % — ABNORMAL LOW (ref 36.0–46.0)
Hemoglobin: 11.3 g/dL — ABNORMAL LOW (ref 12.0–15.0)
MCH: 31.7 pg (ref 26.0–34.0)
MCHC: 32.3 g/dL (ref 30.0–36.0)
MCV: 98.3 fL (ref 80.0–100.0)
Platelets: 289 10*3/uL (ref 150–400)
RBC: 3.56 MIL/uL — ABNORMAL LOW (ref 3.87–5.11)
RDW: 14 % (ref 11.5–15.5)
WBC: 11.6 10*3/uL — ABNORMAL HIGH (ref 4.0–10.5)
nRBC: 0 % (ref 0.0–0.2)

## 2019-10-09 SURGERY — DENTAL RESTORATION/EXTRACTIONS
Anesthesia: General | Site: Mouth | Laterality: Left

## 2019-10-09 MED ORDER — CLINDAMYCIN PHOSPHATE 600 MG/50ML IV SOLN
600.0000 mg | INTRAVENOUS | Status: AC
  Administered 2019-10-09: 12:00:00 600 mg via INTRAVENOUS
  Filled 2019-10-09: qty 50

## 2019-10-09 MED ORDER — LACTATED RINGERS IV SOLN
INTRAVENOUS | Status: DC
  Administered 2019-10-09: 09:00:00 via INTRAVENOUS

## 2019-10-09 MED ORDER — SUCCINYLCHOLINE CHLORIDE 200 MG/10ML IV SOSY
PREFILLED_SYRINGE | INTRAVENOUS | Status: AC
  Filled 2019-10-09: qty 10

## 2019-10-09 MED ORDER — LIDOCAINE-EPINEPHRINE 2 %-1:100000 IJ SOLN
INTRAMUSCULAR | Status: AC
  Filled 2019-10-09: qty 1

## 2019-10-09 MED ORDER — OXYCODONE HCL 5 MG/5ML PO SOLN: 5.0000 mg | Freq: Once | ORAL | Status: AC | PRN

## 2019-10-09 MED ORDER — DEXAMETHASONE SODIUM PHOSPHATE 10 MG/ML IJ SOLN
INTRAMUSCULAR | Status: DC | PRN
  Administered 2019-10-09: 5 mg via INTRAVENOUS

## 2019-10-09 MED ORDER — DEXAMETHASONE SODIUM PHOSPHATE 10 MG/ML IJ SOLN
INTRAMUSCULAR | Status: AC
  Filled 2019-10-09: qty 1

## 2019-10-09 MED ORDER — CLINDAMYCIN HCL 300 MG PO CAPS: 300.0000 mg | ORAL_CAPSULE | Freq: Three times a day (TID) | ORAL | 0 refills | Status: DC

## 2019-10-09 MED ORDER — OXYCODONE-ACETAMINOPHEN 5-325 MG PO TABS: 1.0000 | ORAL_TABLET | ORAL | 0 refills | Status: DC | PRN

## 2019-10-09 MED ORDER — PROPOFOL 10 MG/ML IV BOLUS
INTRAVENOUS | Status: AC
  Filled 2019-10-09: qty 20

## 2019-10-09 MED ORDER — SUCCINYLCHOLINE CHLORIDE 20 MG/ML IJ SOLN
INTRAMUSCULAR | Status: DC | PRN
  Administered 2019-10-09: 80 mg via INTRAVENOUS

## 2019-10-09 MED ORDER — OXYCODONE HCL 5 MG PO TABS
ORAL_TABLET | ORAL | Status: AC
  Filled 2019-10-09: qty 1

## 2019-10-09 MED ORDER — OXYMETAZOLINE HCL 0.05 % NA SOLN
NASAL | Status: DC | PRN
  Administered 2019-10-09: 2 via NASAL

## 2019-10-09 MED ORDER — LIDOCAINE-EPINEPHRINE 2 %-1:100000 IJ SOLN
INTRAMUSCULAR | Status: DC | PRN
  Administered 2019-10-09: 10 mL

## 2019-10-09 MED ORDER — 0.9 % SODIUM CHLORIDE (POUR BTL) OPTIME
TOPICAL | Status: DC | PRN
  Administered 2019-10-09: 1000 mL

## 2019-10-09 MED ORDER — MIDAZOLAM HCL 2 MG/2ML IJ SOLN
INTRAMUSCULAR | Status: AC
  Filled 2019-10-09: qty 2

## 2019-10-09 MED ORDER — PROPOFOL 10 MG/ML IV BOLUS
INTRAVENOUS | Status: DC | PRN
  Administered 2019-10-09: 120 mg via INTRAVENOUS
  Administered 2019-10-09: 20 mg via INTRAVENOUS

## 2019-10-09 MED ORDER — ONDANSETRON HCL 4 MG/2ML IJ SOLN
INTRAMUSCULAR | Status: DC | PRN
  Administered 2019-10-09: 4 mg via INTRAVENOUS

## 2019-10-09 MED ORDER — MIDAZOLAM HCL 5 MG/5ML IJ SOLN
INTRAMUSCULAR | Status: DC | PRN
  Administered 2019-10-09: 1 mg via INTRAVENOUS

## 2019-10-09 MED ORDER — ONDANSETRON HCL 4 MG/2ML IJ SOLN: 4.0000 mg | Freq: Once | INTRAMUSCULAR | Status: DC | PRN

## 2019-10-09 MED ORDER — LIDOCAINE 2% (20 MG/ML) 5 ML SYRINGE
INTRAMUSCULAR | Status: DC | PRN
  Administered 2019-10-09: 60 mg via INTRAVENOUS

## 2019-10-09 MED ORDER — FENTANYL CITRATE (PF) 100 MCG/2ML IJ SOLN
INTRAMUSCULAR | Status: DC | PRN
  Administered 2019-10-09: 100 ug via INTRAVENOUS

## 2019-10-09 MED ORDER — FENTANYL CITRATE (PF) 100 MCG/2ML IJ SOLN: 25.0000 ug | INTRAMUSCULAR | Status: DC | PRN

## 2019-10-09 MED ORDER — ONDANSETRON HCL 4 MG/2ML IJ SOLN
INTRAMUSCULAR | Status: AC
  Filled 2019-10-09: qty 2

## 2019-10-09 MED ORDER — FENTANYL CITRATE (PF) 250 MCG/5ML IJ SOLN
INTRAMUSCULAR | Status: AC
  Filled 2019-10-09: qty 5

## 2019-10-09 MED ORDER — LIDOCAINE 2% (20 MG/ML) 5 ML SYRINGE
INTRAMUSCULAR | Status: AC
  Filled 2019-10-09: qty 5

## 2019-10-09 MED ORDER — ROCURONIUM BROMIDE 50 MG/5ML IV SOSY: PREFILLED_SYRINGE | INTRAVENOUS | Status: DC | PRN

## 2019-10-09 MED ORDER — DEXMEDETOMIDINE HCL 200 MCG/2ML IV SOLN
INTRAVENOUS | Status: DC | PRN
  Administered 2019-10-09: 4 ug via INTRAVENOUS
  Administered 2019-10-09: 8 ug via INTRAVENOUS

## 2019-10-09 MED ORDER — OXYCODONE HCL 5 MG PO TABS
5.0000 mg | ORAL_TABLET | Freq: Once | ORAL | Status: AC | PRN
  Administered 2019-10-09: 5 mg via ORAL

## 2019-10-09 SURGICAL SUPPLY — 40 items
BLADE SURG 15 STRL LF DISP TIS (BLADE) ×1 IMPLANT
BLADE SURG 15 STRL SS (BLADE) ×3
BUR CROSS CUT FISSURE 1.6 (BURR) ×2 IMPLANT
BUR CROSS CUT FISSURE 1.6MM (BURR) ×1
BUR EGG ELITE 4.0 (BURR) ×2 IMPLANT
BUR EGG ELITE 4.0MM (BURR) ×1
CANISTER SUCT 3000ML PPV (MISCELLANEOUS) ×3 IMPLANT
COVER SURGICAL LIGHT HANDLE (MISCELLANEOUS) ×3 IMPLANT
COVER WAND RF STERILE (DRAPES) ×3 IMPLANT
DECANTER SPIKE VIAL GLASS SM (MISCELLANEOUS) ×3 IMPLANT
DRAPE U-SHAPE 76X120 STRL (DRAPES) ×3 IMPLANT
GAUZE PACKING FOLDED 2  STR (GAUZE/BANDAGES/DRESSINGS) ×2
GAUZE PACKING FOLDED 2 STR (GAUZE/BANDAGES/DRESSINGS) ×1 IMPLANT
GLOVE BIO SURGEON STRL SZ 6.5 (GLOVE) IMPLANT
GLOVE BIO SURGEON STRL SZ7 (GLOVE) IMPLANT
GLOVE BIO SURGEON STRL SZ7.5 (GLOVE) ×3 IMPLANT
GLOVE BIO SURGEONS STRL SZ 6.5 (GLOVE)
GLOVE BIOGEL PI IND STRL 6.5 (GLOVE) IMPLANT
GLOVE BIOGEL PI IND STRL 7.0 (GLOVE) IMPLANT
GLOVE BIOGEL PI INDICATOR 6.5 (GLOVE)
GLOVE BIOGEL PI INDICATOR 7.0 (GLOVE)
GOWN STRL REUS W/ TWL LRG LVL3 (GOWN DISPOSABLE) ×1 IMPLANT
GOWN STRL REUS W/ TWL XL LVL3 (GOWN DISPOSABLE) ×1 IMPLANT
GOWN STRL REUS W/TWL LRG LVL3 (GOWN DISPOSABLE) ×3
GOWN STRL REUS W/TWL XL LVL3 (GOWN DISPOSABLE) ×3
IV NS 1000ML (IV SOLUTION) ×3
IV NS 1000ML BAXH (IV SOLUTION) ×1 IMPLANT
KIT BASIN OR (CUSTOM PROCEDURE TRAY) ×3 IMPLANT
KIT TURNOVER KIT B (KITS) ×3 IMPLANT
NDL HYPO 25GX1X1/2 BEV (NEEDLE) ×2 IMPLANT
NEEDLE HYPO 25GX1X1/2 BEV (NEEDLE) ×6 IMPLANT
NS IRRIG 1000ML POUR BTL (IV SOLUTION) ×3 IMPLANT
PAD ARMBOARD 7.5X6 YLW CONV (MISCELLANEOUS) ×3 IMPLANT
SLEEVE IRRIGATION ELITE 7 (MISCELLANEOUS) ×3 IMPLANT
SPONGE SURGIFOAM ABS GEL 12-7 (HEMOSTASIS) IMPLANT
SUT CHROMIC 3 0 PS 2 (SUTURE) ×3 IMPLANT
SYR CONTROL 10ML LL (SYRINGE) ×3 IMPLANT
TRAY ENT MC OR (CUSTOM PROCEDURE TRAY) ×3 IMPLANT
TUBING IRRIGATION (MISCELLANEOUS) ×3 IMPLANT
YANKAUER SUCT BULB TIP NO VENT (SUCTIONS) ×3 IMPLANT

## 2019-10-09 NOTE — Anesthesia Postprocedure Evaluation (Signed)
Anesthesia Post Note  Patient: Beth Hubbard  Procedure(s) Performed: DENTAL EXTRACTION X1 and Irrigation and Debridement. (Left Mouth)     Patient location during evaluation: PACU Anesthesia Type: General Level of consciousness: awake and alert Pain management: pain level controlled Vital Signs Assessment: post-procedure vital signs reviewed and stable Respiratory status: spontaneous breathing, nonlabored ventilation and respiratory function stable Cardiovascular status: blood pressure returned to baseline and stable Postop Assessment: no apparent nausea or vomiting Anesthetic complications: no    Last Vitals:  Vitals:   10/09/19 1254 10/09/19 1308  BP:  (!) 170/76  Pulse: (!) 101 (!) 101  Resp: (!) 22 (!) 21  Temp:    SpO2: 97% 95%    Last Pain:  Vitals:   10/09/19 0855  TempSrc:   PainSc: 0-No pain                 Lidia Collum

## 2019-10-09 NOTE — Anesthesia Procedure Notes (Addendum)
Procedure Name: Intubation Date/Time: 10/09/2019 12:04 PM Performed by: Trinna Post., CRNA Pre-anesthesia Checklist: Patient identified, Emergency Drugs available, Suction available and Patient being monitored Patient Re-evaluated:Patient Re-evaluated prior to induction Oxygen Delivery Method: Circle system utilized Preoxygenation: Pre-oxygenation with 100% oxygen Induction Type: IV induction and Rapid sequence Ventilation: Mask ventilation without difficulty Laryngoscope Size: Mac and 3 Grade View: Grade II Nasal Tubes: Right, Nasal Rae, Magill forceps- large, utilized and Nasal prep performed Tube size: 6.5 mm Number of attempts: 1 Placement Confirmation: ETT inserted through vocal cords under direct vision,  positive ETCO2 and breath sounds checked- equal and bilateral Tube secured with: Tape Dental Injury: Teeth and Oropharynx as per pre-operative assessment  Comments: Placed by Basil Dess

## 2019-10-09 NOTE — H&P (Signed)
HISTORY AND PHYSICAL  Beth Hubbard is a 73 y.o. female patient with CC: pain left upper front tooth with swelling  HPI: Patient with pain and swelling left upper lip/infraorbital area. Seen in office 3 days ago and unable to tolerate extraction abscessed tooth with local/nitrous.   No diagnosis found.  Past Medical History:  Diagnosis Date  . Arthritis   . Diabetes mellitus without complication ([place])   . Excessive daytime sleepiness   . Glaucoma   . High cholesterol   . Hypertension   . Mitral regurgitation    mild by echo 03/2017  . OSA (obstructive sleep apnea) 10/18/2015   Mild OSA with AHI 12.5/hr on CPAP at 8cm H2O  . Pneumonia   . Shingles   . TIA (transient ischemic attack)     Current Facility-Administered Medications  Medication Dose Route Frequency Provider Last Rate Last Dose  . clindamycin (CLEOCIN) IVPB 600 mg  600 mg Intravenous On Call to OR Diona Browner, DDS      . lactated ringers infusion   Intravenous Continuous Lidia Collum, MD 10 mL/hr at 10/09/19 0900     Allergies  Allergen Reactions  . Aspirin     Tremor   . Penicillins Itching and Other (See Comments)    Reaction: itching,headache, dizziness and anxiety. Feels like she is "goin off" Did it involve swelling of the face/tongue/throat, SOB, or low BP? No Did it involve sudden or severe rash/hives, skin peeling, or any reaction on the inside of your mouth or nose? No Did you need to seek medical attention at a hospital or doctor's office? No When did it last happen?Childhood If all above answers are "NO", may proceed with cephalosporin use.  . Nsaids Anxiety    Reaction: causes dizziness and headache. Ibuprofen. Aspirin   Active Problems:   * No active hospital problems. *  Vitals: Blood pressure (!) 150/54, pulse 91, temperature 99.1 F (37.3 C), temperature source Oral, resp. rate 20, height 5\' 2"  (1.575 m), weight 72.6 kg, SpO2 98 %. Lab results: Results for orders placed or  performed during the hospital encounter of 10/09/19 (from the past 24 hour(s))  Glucose, capillary     Status: Abnormal   Collection Time: 10/09/19  8:35 AM  Result Value Ref Range   Glucose-Capillary 102 (H) 70 - 99 mg/dL   Comment 1 Notify RN    Comment 2 Document in Chart   CBC     Status: Abnormal   Collection Time: 10/09/19  9:02 AM  Result Value Ref Range   WBC 11.6 (H) 4.0 - 10.5 K/uL   RBC 3.56 (L) 3.87 - 5.11 MIL/uL   Hemoglobin 11.3 (L) 12.0 - 15.0 g/dL   HCT 35.0 (L) 36.0 - 46.0 %   MCV 98.3 80.0 - 100.0 fL   MCH 31.7 26.0 - 34.0 pg   MCHC 32.3 30.0 - 36.0 g/dL   RDW 14.0 11.5 - 15.5 %   Platelets 289 150 - 400 K/uL   nRBC 0.0 0.0 - 0.2 %   Radiology Results: No results found. General appearance: alert, cooperative and moderately obese Head: Normocephalic, without obvious abnormality, atraumatic Eyes: negative Nose: Nares normal. Septum midline. Mucosa normal. No drainage or sinus tenderness. Throat: moderate firm edema left cheek, infraorbital area. abscessed tooth #11. Pharynx clear, no trismus. Neck: no adenopathy and supple, symmetrical, trachea midline Resp: clear to auscultation bilaterally Cardio: regular rate and rhythm, S1, S2 normal, no murmur, click, rub or gallop  Assessment: Left  facial cellulitis, abscessed tooth #11  Plan: Extraction tooth #11. I and left canine space. GA. Day surgery   Diona Browner 10/09/2019

## 2019-10-09 NOTE — Anesthesia Preprocedure Evaluation (Signed)
Anesthesia Evaluation  Patient identified by MRN, date of birth, ID band Patient awake    Reviewed: Allergy & Precautions, NPO status , Patient's Chart, lab work & pertinent test results  History of Anesthesia Complications Negative for: history of anesthetic complications  Airway Mallampati: II  TM Distance: >3 FB Neck ROM: Full    Dental  (+) Teeth Intact   Pulmonary sleep apnea ,    Pulmonary exam normal        Cardiovascular hypertension, Normal cardiovascular exam+ Valvular Problems/Murmurs MR      Neuro/Psych TIAnegative psych ROS   GI/Hepatic negative GI ROS, Neg liver ROS,   Endo/Other  diabetes  Renal/GU negative Renal ROS  negative genitourinary   Musculoskeletal negative musculoskeletal ROS (+)   Abdominal   Peds  Hematology negative hematology ROS (+)   Anesthesia Other Findings   Reproductive/Obstetrics                             Anesthesia Physical Anesthesia Plan  ASA: III  Anesthesia Plan: General   Post-op Pain Management:    Induction: Intravenous  PONV Risk Score and Plan: 3 and Ondansetron, Dexamethasone, Treatment may vary due to age or medical condition and Midazolam  Airway Management Planned: Nasal ETT  Additional Equipment: None  Intra-op Plan:   Post-operative Plan: Extubation in OR  Informed Consent: I have reviewed the patients History and Physical, chart, labs and discussed the procedure including the risks, benefits and alternatives for the proposed anesthesia with the patient or authorized representative who has indicated his/her understanding and acceptance.     Dental advisory given  Plan Discussed with:   Anesthesia Plan Comments:         Anesthesia Quick Evaluation

## 2019-10-09 NOTE — Transfer of Care (Signed)
Immediate Anesthesia Transfer of Care Note  Patient: Beth Hubbard  Procedure(s) Performed: DENTAL EXTRACTION X1 and Irrigation and Debridement. (Left Mouth)  Patient Location: PACU  Anesthesia Type:General  Level of Consciousness: alert , oriented, drowsy and patient cooperative  Airway & Oxygen Therapy: Patient Spontanous Breathing  Post-op Assessment: Report given to RN, Post -op Vital signs reviewed and stable and Patient moving all extremities  Post vital signs: Reviewed and stable  Last Vitals:  Vitals Value Taken Time  BP    Temp    Pulse 100 10/09/19 1253  Resp 13 10/09/19 1253  SpO2 95 % 10/09/19 1253  Vitals shown include unvalidated device data.  Last Pain:  Vitals:   10/09/19 0855  TempSrc:   PainSc: 0-No pain         Complications: No apparent anesthesia complications

## 2019-10-09 NOTE — Op Note (Signed)
10/09/2019  12:24 PM  PATIENT:  Beth Hubbard  73 y.o. female  PRE-OPERATIVE DIAGNOSIS:  Left facial cellulitis and Abscess tooth.  POST-OPERATIVE DIAGNOSIS:  SAME  PROCEDURE:  Procedure(s): DENTAL EXTRACTION tooth #11, and Incision and drainage left canine space infection  SURGEON:  Surgeon(s): Diona Browner, DDS  ANESTHESIA:   local and general  EBL:  minimal  DRAINS: none   SPECIMEN:  No Specimen  COUNTS:  YES  PLAN OF CARE: Discharge to home after PACU  PATIENT DISPOSITION:  PACU - hemodynamically stable.   PROCEDURE DETAILS: Dictation # [id number]  Gae Bon, DMD 10/09/2019 12:24 PM

## 2019-10-09 NOTE — Progress Notes (Signed)
Patient is about to climb OOB; tearful and stating "I'm hungry" and "no one knows the pain I'm in"; now c/o significant HA; states "I know my pressure is up, I can feel it".  CBG 87 on recheck.  Dr. Christella Hartigan notified of patient's distress.  No new orders.  Daughter, Parke Simmers, called and patient spoke w/her over the telephone.  Daughter explained to patient that she would have to wait.  Patient calms down a little and sits back in bed, but remains tearful and is holding her head.  Again states "no one knows... my head..."  BP now 174/65.  RN stays at bedside w/patient at this time.

## 2019-10-09 NOTE — Op Note (Signed)
NAME: Beth Hubbard, Beth Hubbard [facility] MEDICAL RECORD [id number] ACCOUNT [id number] DATE OF BIRTH:Apr 12, 1946 FACILITY: [facility] LOCATION: [facility] PHYSICIAN:Polo Mcmartin M. Kannon Granderson, DDS  OPERATIVE REPORT  DATE OF PROCEDURE:  10/09/2019  PREOPERATIVE DIAGNOSIS:  Left facial cellulitis, abscess tooth 11.  POSTOPERATIVE DIAGNOSIS:  Left buccal space abscess with left facial cellulitis and abscess tooth 11.  PROCEDURE:  Extraction of tooth 11, incision and drainage left canine space infection.  SURGEON:  Diona Browner, DDS  ANESTHESIA:  General, nasal intubation, Dr. Kerin Perna attending.  DESCRIPTION OF PROCEDURE:  The patient was taken to the operating room and placed on the table in supine position.  General anesthesia was administered intravenously and a nasal endotracheal tube was placed and secured.  The eyes were protected and the  patient was draped for surgery.  A timeout was performed.  The posterior pharynx was suctioned and a throat pack was placed, 2% lidocaine with 1:100,000 epinephrine was infiltrated in the canine space, the left maxilla and palatally along the mucosa  adjacent to tooth 11.  A 15 blade was used to make an incision in the buccal sulcus adjacent to tooth 11 and immediately there was outpouring of purulent exudate.  Aerobic and anaerobic cultures were taken.  Hemostats were inserted into the incision and  dissected superiorly to remove any loculations that may be present.  Then, a 15 blade used to make an incision around tooth 11.  The tooth was elevated with a 301 elevator and removed from the mouth with the dental forceps.  The socket was curetted and  then irrigation was used in the socket and in the infected site.  Then, a quarter inch Penrose drain was placed with a hemostat into the canine space and sutured to the mucosa with 3-0 silk.  Then, the oral cavity was irrigated and suctioned and a throat  pack was removed.  The patient was left in care of anesthesia for extubation and  transport to recovery room with plans for discharge home through day surgery.  ESTIMATED BLOOD LOSS:  Minimal.  COMPLICATIONS:  None.  SPECIMENS:  None.  TN/NUANCE  D:10/09/2019 T:10/09/2019 JOB:[id number]

## 2019-10-09 NOTE — Progress Notes (Signed)
Patient provided w/an ice pack to hold to her head and warm blankets for comfort and positioning in bed.  Will continue to monitor.

## 2019-10-10 ENCOUNTER — Encounter (HOSPITAL_COMMUNITY): Payer: Self-pay | Admitting: Oral Surgery

## 2019-10-14 LAB — AEROBIC/ANAEROBIC CULTURE W GRAM STAIN (SURGICAL/DEEP WOUND): Culture: NORMAL

## 2019-10-22 ENCOUNTER — Other Ambulatory Visit: Payer: Self-pay | Admitting: Family Medicine

## 2019-10-22 DIAGNOSIS — M542 Cervicalgia: Secondary | ICD-10-CM

## 2019-11-30 ENCOUNTER — Other Ambulatory Visit: Payer: Self-pay | Admitting: Family Medicine

## 2019-11-30 DIAGNOSIS — E119 Type 2 diabetes mellitus without complications: Secondary | ICD-10-CM

## 2019-12-23 ENCOUNTER — Telehealth: Payer: Self-pay | Admitting: *Deleted

## 2019-12-23 NOTE — Telephone Encounter (Signed)
Informed patient of compliance results and verbalized understanding was indicated. °Patient is aware and agreeable to AHI being within range at 1.3. °Patient is aware and agreeable to being in compliance with machine usage. °Patient is aware and agreeable to no change in current pressures. °

## 2019-12-23 NOTE — Telephone Encounter (Signed)
-----   Message from Sueanne Margarita, MD sent at 12/22/2019  5:09 PM EST ----- Good AHI and compliance.  Continue current PAP settings.

## 2019-12-24 ENCOUNTER — Ambulatory Visit: Payer: Medicare Other | Admitting: Family Medicine

## 2019-12-25 ENCOUNTER — Encounter: Payer: Self-pay | Admitting: Nurse Practitioner

## 2019-12-25 ENCOUNTER — Ambulatory Visit (INDEPENDENT_AMBULATORY_CARE_PROVIDER_SITE_OTHER): Payer: Medicare Other | Admitting: Nurse Practitioner

## 2019-12-25 ENCOUNTER — Other Ambulatory Visit: Payer: Self-pay

## 2019-12-25 VITALS — BP 133/62 | HR 62 | Temp 98.6°F | Resp 16 | Ht 62.0 in | Wt 169.0 lb

## 2019-12-25 DIAGNOSIS — E785 Hyperlipidemia, unspecified: Secondary | ICD-10-CM

## 2019-12-25 DIAGNOSIS — I10 Essential (primary) hypertension: Secondary | ICD-10-CM | POA: Diagnosis not present

## 2019-12-25 DIAGNOSIS — M25531 Pain in right wrist: Secondary | ICD-10-CM

## 2019-12-25 DIAGNOSIS — G8929 Other chronic pain: Secondary | ICD-10-CM

## 2019-12-25 DIAGNOSIS — E119 Type 2 diabetes mellitus without complications: Secondary | ICD-10-CM | POA: Diagnosis not present

## 2019-12-25 LAB — POCT URINALYSIS DIPSTICK
Bilirubin, UA: NEGATIVE
Blood, UA: NEGATIVE
Glucose, UA: NEGATIVE
Ketones, UA: NEGATIVE
Nitrite, UA: NEGATIVE
Protein, UA: NEGATIVE
Spec Grav, UA: 1.015 (ref 1.010–1.025)
Urobilinogen, UA: 0.2 E.U./dL
pH, UA: 5.5 (ref 5.0–8.0)

## 2019-12-25 NOTE — Progress Notes (Signed)
Established Patient Office Visit  Subjective:  Patient ID: Beth Hubbard, female    DOB: May 13, 1946  Age: 74 y.o. MRN: [id number]  CC:  Chief Complaint  Patient presents with  . Hypertension  . Diabetes    HPI Beth Hubbard presents for follow up.  She has a history of diabetes hypertension, glaucoma, obstructive sleep apnea, mitral regurgitation, TIA, hyperlipidemia.  She was recently visited by her insurance company denied healthcare.  She had an hemoglobin A1c completed 6.4%.  She continues to take her medication as directed Januvia 50 mg.  She discontinued to treat her glaucoma and significant. For her hypertension she continues to take Norvasc 5 mg daily. Denies headache, dizziness, visual changes, shortness of breath, dyspnea on exertion, chest pain, nausea, vomiting or any edema.  She complains of right wrist pain.  She denies any injury.  She feels like it may be arthritis.  He is very tender to the touch.  She does keep it wrapped with a small bandage.  She denies any numbness tingling.  She does have some weakness.  Past Medical History:  Diagnosis Date  . Arthritis   . Diabetes mellitus without complication ([place])   . Excessive daytime sleepiness   . Glaucoma   . High cholesterol   . Hypertension   . Mitral regurgitation    mild by echo 03/2017  . OSA (obstructive sleep apnea) 10/18/2015   Mild OSA with AHI 12.5/hr on CPAP at 8cm H2O  . Pneumonia   . Shingles   . TIA (transient ischemic attack)     Past Surgical History:  Procedure Laterality Date  . ABDOMINAL SURGERY    . EYE SURGERY Bilateral    cataract surgery  . ROTATOR CUFF REPAIR     bilateral  . TEE WITHOUT CARDIOVERSION N/A 07/20/2016   Procedure: TRANSESOPHAGEAL ECHOCARDIOGRAM (TEE);  Surgeon: Pixie Casino, MD;  Location: [facility];  Service: Cardiovascular;  Laterality: N/A;  . TOOTH EXTRACTION Left 10/09/2019   Procedure: DENTAL EXTRACTION X1 and Irrigation and Debridement.;  Surgeon:  Diona Browner, DDS;  Location: [facility];  Service: Oral Surgery;  Laterality: Left;  DENTAL EXTRACTION X1 and Irrigation and Debridement.    Family History  Problem Relation Age of Onset  . Cancer Father   . Cancer Sister   . Cancer Brother     Social History   Socioeconomic History  . Marital status: Legally Separated    Spouse name: Not on file  . Number of children: Not on file  . Years of education: Not on file  . Highest education level: Not on file  Occupational History  . Not on file  Tobacco Use  . Smoking status: Never Smoker  . Smokeless tobacco: Never Used  Substance and Sexual Activity  . Alcohol use: No  . Drug use: No  . Sexual activity: Not on file  Other Topics Concern  . Not on file  Social History Narrative  . Not on file   Social Determinants of Health   Financial Resource Strain:   . Difficulty of Paying Living Expenses: Not on file  Food Insecurity:   . Worried About Charity fundraiser in the Last Year: Not on file  . Ran Out of Food in the Last Year: Not on file  Transportation Needs:   . Lack of Transportation (Medical): Not on file  . Lack of Transportation (Non-Medical): Not on file  Physical Activity:   . Days of Exercise per Week: Not on  file  . Minutes of Exercise per Session: Not on file  Stress:   . Feeling of Stress : Not on file  Social Connections:   . Frequency of Communication with Friends and Family: Not on file  . Frequency of Social Gatherings with Friends and Family: Not on file  . Attends Religious Services: Not on file  . Active Member of Clubs or Organizations: Not on file  . Attends Archivist Meetings: Not on file  . Marital Status: Not on file  Intimate Partner Violence:   . Fear of Current or Ex-Partner: Not on file  . Emotionally Abused: Not on file  . Physically Abused: Not on file  . Sexually Abused: Not on file    Outpatient Medications Prior to Visit  Medication Sig Dispense Refill  . ACCU-CHEK  AVIVA PLUS test strip Reported on 02/24/2016 100 each 1  . ACCU-CHEK SOFTCLIX LANCETS lancets Check BS once or twice a day. 100 each 1  . amLODipine (NORVASC) 5 MG tablet TAKE 1 TABLET(5 MG) BY MOUTH DAILY (Patient taking differently: Take 5 mg by mouth daily. ) 90 tablet 3  . Blood Glucose Monitoring Suppl (ACCU-CHEK AVIVA PLUS) w/Device KIT 1 kit by Does not apply route as directed. Check 1 to 2 times daily 1 kit 0  . COMBIGAN 0.2-0.5 % ophthalmic solution Place 1 drop into both eyes 2 (two) times daily.     . cyclobenzaprine (FLEXERIL) 10 MG tablet TAKE 1 TABLET(10 MG) BY MOUTH THREE TIMES DAILY AS NEEDED FOR MUSCLE SPASMS (Patient taking differently: Take 10 mg by mouth 3 (three) times daily. ) 30 tablet 0  . cycloSPORINE (RESTASIS) 0.05 % ophthalmic emulsion Place 1 drop into both eyes 2 (two) times daily.     . dorzolamide (TRUSOPT) 2 % ophthalmic solution Place 1 drop into both eyes 2 (two) times daily.     . ferrous sulfate 325 (65 FE) MG tablet Take 325 mg by mouth daily.     Marland Kitchen gabapentin (NEURONTIN) 300 MG capsule TAKE 1 CAPSULE BY MOUTH EVERY NIGHT AT BEDTIME 90 capsule 1  . HYDROcodone-acetaminophen (NORCO) 10-325 MG tablet Take 1 tablet by mouth every 6 (six) hours as needed.    Marland Kitchen ROCKLATAN 0.02-0.005 % SOLN Place 1 drop into both eyes at bedtime.    . rosuvastatin (CRESTOR) 20 MG tablet Take 1 tablet (20 mg total) by mouth daily. 90 tablet 3  . sitaGLIPtin (JANUVIA) 50 MG tablet TAKE 1 TABLET(50 MG) BY MOUTH DAILY 90 tablet 3  . traZODone (DESYREL) 50 MG tablet TAKE 1 TABLET BY MOUTH AT BEDTIME IF NEEDED FOR SLEEP (Patient taking differently: Take 50 mg by mouth at bedtime. ) 90 tablet 1  . clindamycin (CLEOCIN) 300 MG capsule Take 1 capsule (300 mg total) by mouth 3 (three) times daily. 21 capsule 0  . oxyCODONE-acetaminophen (PERCOCET) 5-325 MG tablet Take 1 tablet by mouth every 4 (four) hours as needed. (Patient not taking: Reported on 12/25/2019) 30 tablet 0   No  facility-administered medications prior to visit.    Allergies  Allergen Reactions  . Aspirin     Tremor   . Penicillins Itching and Other (See Comments)    Reaction: itching,headache, dizziness and anxiety. Feels like she is "goin off" Did it involve swelling of the face/tongue/throat, SOB, or low BP? No Did it involve sudden or severe rash/hives, skin peeling, or any reaction on the inside of your mouth or nose? No Did you need to seek medical attention at  a hospital or doctor's office? No When did it last happen?Childhood If all above answers are "NO", may proceed with cephalosporin use.  . Nsaids Anxiety    Reaction: causes dizziness and headache. Ibuprofen. Aspirin    ROS Review of Systems  Constitutional: Negative.   HENT: Negative.   Eyes:       No changes  Respiratory: Negative.   Cardiovascular: Negative.   Gastrointestinal: Negative.   Endocrine: Negative.   Genitourinary: Negative.   Musculoskeletal: Positive for arthralgias.       Right wrist  Skin: Negative.   Allergic/Immunologic: Negative.   Neurological: Negative.   Hematological: Negative.   Psychiatric/Behavioral: Negative.       Objective:    Physical Exam  Constitutional: She is oriented to person, place, and time. She appears well-developed and well-nourished.  HENT:  Head: Normocephalic.  Cardiovascular: Normal rate, regular rhythm and intact distal pulses.  Trace edema  Pulmonary/Chest: Effort normal.  Abdominal: Soft. Bowel sounds are normal.  Musculoskeletal:        General: Tenderness present.     Cervical back: Normal range of motion.     Comments: Right wrist nodule  Neurological: She is alert and oriented to person, place, and time.  Skin: Skin is warm and dry.  Psychiatric: She has a normal mood and affect. Her behavior is normal. Judgment and thought content normal.    BP 133/62 (BP Location: Left Arm, Patient Position: Sitting, Cuff Size: Large)   Pulse 62   Temp 98.6  F (37 C) (Oral)   Resp 16   Ht 5' 2"  (1.575 m)   Wt 169 lb (76.7 kg)   SpO2 100%   BMI 30.91 kg/m  Wt Readings from Last 3 Encounters:  12/25/19 169 lb (76.7 kg)  10/09/19 160 lb (72.6 kg)  08/24/19 167 lb (75.8 kg)     Health Maintenance Due  Topic Date Due  . OPHTHALMOLOGY EXAM  09/04/2018  . FOOT EXAM  08/16/2019    There are no preventive care reminders to display for this patient.  Lab Results  Component Value Date   TSH 1.27 03/05/2016   Lab Results  Component Value Date   WBC 11.6 (H) 10/09/2019   HGB 11.3 (L) 10/09/2019   HCT 35.0 (L) 10/09/2019   MCV 98.3 10/09/2019   PLT 289 10/09/2019   Lab Results  Component Value Date   NA 140 10/09/2019   K 4.0 10/09/2019   CO2 34 (H) 10/09/2019   GLUCOSE 116 (H) 10/09/2019   BUN 12 10/09/2019   CREATININE 0.90 10/09/2019   BILITOT 0.3 07/03/2019   ALKPHOS 59 07/03/2019   AST 19 07/03/2019   ALT 14 07/03/2019   PROT 7.3 07/03/2019   ALBUMIN 4.6 07/03/2019   CALCIUM 9.3 10/09/2019   ANIONGAP 11 10/09/2019   Lab Results  Component Value Date   CHOL 172 07/03/2019   Lab Results  Component Value Date   HDL 51 07/03/2019   Lab Results  Component Value Date   LDLCALC 83 07/03/2019   Lab Results  Component Value Date   TRIG 190 (H) 07/03/2019   Lab Results  Component Value Date   CHOLHDL 3.4 07/03/2019   Lab Results  Component Value Date   HGBA1C 6.5 (A) 07/03/2019      Assessment & Plan:   Problem List Items Addressed This Visit      Unprioritized   Diabetes mellitus without complication ([place])   Hypertension - Primary   Relevant Orders  Urinalysis Dipstick (Completed)   CBC with Differential/Platelet   Comp. Metabolic Panel (12)    Other Visit Diagnoses    Wrist pain, chronic, right       X-ray pending   Relevant Medications   HYDROcodone-acetaminophen (NORCO) 10-325 MG tablet   Other Relevant Orders   DG Wrist Complete Right   Hyperlipidemia, unspecified hyperlipidemia type        Continue with current regimen Labs later date      No orders of the defined types were placed in this encounter.   Follow-up: Return in about 6 months (around 06/23/2020).    Vevelyn Francois, NP

## 2019-12-31 ENCOUNTER — Telehealth: Payer: Self-pay | Admitting: Family Medicine

## 2019-12-31 NOTE — Telephone Encounter (Signed)
Pt states that a nurse is supposed to tell her where to get her x-rays for her wrist after her upcoming lab visit.

## 2019-12-31 NOTE — Telephone Encounter (Signed)
Patient called and informed of where to go for Xray. Thanks!

## 2020-01-01 ENCOUNTER — Other Ambulatory Visit: Payer: Medicare Other

## 2020-01-05 ENCOUNTER — Other Ambulatory Visit: Payer: Medicare Other

## 2020-01-05 ENCOUNTER — Ambulatory Visit (HOSPITAL_COMMUNITY)
Admission: RE | Admit: 2020-01-05 | Discharge: 2020-01-05 | Disposition: A | Discharge status: Home / Self Care | Payer: Medicare Other | Source: Ambulatory Visit | Attending: Nurse Practitioner | Admitting: Nurse Practitioner

## 2020-01-05 ENCOUNTER — Other Ambulatory Visit: Payer: Self-pay

## 2020-01-05 ENCOUNTER — Encounter (HOSPITAL_COMMUNITY): Payer: Self-pay

## 2020-01-05 DIAGNOSIS — G8929 Other chronic pain: Secondary | ICD-10-CM

## 2020-01-06 LAB — CBC WITH DIFFERENTIAL/PLATELET
Basophils Absolute: 0 10*3/uL (ref 0.0–0.2)
Basos: 0 %
EOS (ABSOLUTE): 0.1 10*3/uL (ref 0.0–0.4)
Eos: 2 %
Hematocrit: 34.9 % (ref 34.0–46.6)
Hemoglobin: 11.8 g/dL (ref 11.1–15.9)
Immature Grans (Abs): 0 10*3/uL (ref 0.0–0.1)
Immature Granulocytes: 0 %
Lymphocytes Absolute: 2.4 10*3/uL (ref 0.7–3.1)
Lymphs: 38 %
MCH: 31.6 pg (ref 26.6–33.0)
MCHC: 33.8 g/dL (ref 31.5–35.7)
MCV: 94 fL (ref 79–97)
Monocytes Absolute: 0.5 10*3/uL (ref 0.1–0.9)
Monocytes: 7 %
Neutrophils Absolute: 3.3 10*3/uL (ref 1.4–7.0)
Neutrophils: 53 %
Platelets: 213 10*3/uL (ref 150–450)
RBC: 3.73 x10E6/uL — ABNORMAL LOW (ref 3.77–5.28)
RDW: 13.8 % (ref 11.7–15.4)
WBC: 6.3 10*3/uL (ref 3.4–10.8)

## 2020-01-06 LAB — COMP. METABOLIC PANEL (12)
AST: 17 IU/L (ref 0–40)
Albumin/Globulin Ratio: 1.6 (ref 1.2–2.2)
Albumin: 4.3 g/dL (ref 3.7–4.7)
Alkaline Phosphatase: 57 IU/L (ref 39–117)
BUN/Creatinine Ratio: 13 (ref 12–28)
BUN: 11 mg/dL (ref 8–27)
Bilirubin Total: <0.2 mg/dL (ref 0.0–1.2)
Calcium: 9.2 mg/dL (ref 8.7–10.3)
Chloride: 102 mmol/L (ref 96–106)
Creatinine, Ser: 0.86 mg/dL (ref 0.57–1.00)
GFR calc Af Amer: 77 mL/min/{1.73_m2} (ref >59)
GFR calc non Af Amer: 67 mL/min/{1.73_m2} (ref >59)
Globulin, Total: 2.7 g/dL (ref 1.5–4.5)
Glucose: 117 mg/dL — ABNORMAL HIGH (ref 65–99)
Potassium: 3.9 mmol/L (ref 3.5–5.2)
Sodium: 142 mmol/L (ref 134–144)
Total Protein: 7 g/dL (ref 6.0–8.5)

## 2020-01-12 ENCOUNTER — Other Ambulatory Visit: Payer: Self-pay

## 2020-01-12 ENCOUNTER — Ambulatory Visit (HOSPITAL_COMMUNITY)
Admission: RE | Admit: 2020-01-12 | Discharge: 2020-01-12 | Disposition: A | Discharge status: Home / Self Care | Payer: Medicare Other | Source: Ambulatory Visit | Attending: Nurse Practitioner | Admitting: Nurse Practitioner

## 2020-01-12 DIAGNOSIS — M25531 Pain in right wrist: Secondary | ICD-10-CM | POA: Diagnosis present

## 2020-01-12 DIAGNOSIS — G8929 Other chronic pain: Secondary | ICD-10-CM | POA: Diagnosis present

## 2020-01-13 ENCOUNTER — Telehealth: Payer: Self-pay

## 2020-01-13 NOTE — Telephone Encounter (Signed)
Called and advised of normal xray. Thanks!

## 2020-01-13 NOTE — Telephone Encounter (Signed)
-----   Message from Vevelyn Francois, NP sent at 01/13/2020  8:52 AM EST ----- Please make the patient aware that her x-ray was normal thank you

## 2020-01-20 ENCOUNTER — Other Ambulatory Visit: Payer: Self-pay | Admitting: Family Medicine

## 2020-01-20 DIAGNOSIS — G4700 Insomnia, unspecified: Secondary | ICD-10-CM

## 2020-02-27 ENCOUNTER — Emergency Department (HOSPITAL_COMMUNITY)
Admission: EM | Admit: 2020-02-27 | Discharge: 2020-02-27 | Disposition: A | Discharge status: Home / Self Care | Payer: Medicare Other | Attending: Emergency Medicine | Admitting: Emergency Medicine

## 2020-02-27 ENCOUNTER — Other Ambulatory Visit: Payer: Self-pay

## 2020-02-27 ENCOUNTER — Encounter (HOSPITAL_COMMUNITY): Payer: Self-pay | Admitting: Emergency Medicine

## 2020-02-27 DIAGNOSIS — Z8673 Personal history of transient ischemic attack (TIA), and cerebral infarction without residual deficits: Secondary | ICD-10-CM | POA: Diagnosis not present

## 2020-02-27 DIAGNOSIS — K029 Dental caries, unspecified: Secondary | ICD-10-CM | POA: Diagnosis not present

## 2020-02-27 DIAGNOSIS — I10 Essential (primary) hypertension: Secondary | ICD-10-CM | POA: Insufficient documentation

## 2020-02-27 DIAGNOSIS — K0889 Other specified disorders of teeth and supporting structures: Secondary | ICD-10-CM | POA: Diagnosis present

## 2020-02-27 DIAGNOSIS — Z7984 Long term (current) use of oral hypoglycemic drugs: Secondary | ICD-10-CM | POA: Insufficient documentation

## 2020-02-27 DIAGNOSIS — Z79899 Other long term (current) drug therapy: Secondary | ICD-10-CM | POA: Insufficient documentation

## 2020-02-27 DIAGNOSIS — E119 Type 2 diabetes mellitus without complications: Secondary | ICD-10-CM | POA: Insufficient documentation

## 2020-02-27 LAB — CBC WITH DIFFERENTIAL/PLATELET
Abs Immature Granulocytes: 0.05 10*3/uL (ref 0.00–0.07)
Basophils Absolute: 0 10*3/uL (ref 0.0–0.1)
Basophils Relative: 0 %
Eosinophils Absolute: 0 10*3/uL (ref 0.0–0.5)
Eosinophils Relative: 0 %
HCT: 40.5 % (ref 36.0–46.0)
Hemoglobin: 13.1 g/dL (ref 12.0–15.0)
Immature Granulocytes: 0 %
Lymphocytes Relative: 15 %
Lymphs Abs: 1.9 10*3/uL (ref 0.7–4.0)
MCH: 31.2 pg (ref 26.0–34.0)
MCHC: 32.3 g/dL (ref 30.0–36.0)
MCV: 96.4 fL (ref 80.0–100.0)
Monocytes Absolute: 1.2 10*3/uL — ABNORMAL HIGH (ref 0.1–1.0)
Monocytes Relative: 9 %
Neutro Abs: 9.5 10*3/uL — ABNORMAL HIGH (ref 1.7–7.7)
Neutrophils Relative %: 76 %
Platelets: 253 10*3/uL (ref 150–400)
RBC: 4.2 MIL/uL (ref 3.87–5.11)
RDW: 14.8 % (ref 11.5–15.5)
WBC: 12.6 10*3/uL — ABNORMAL HIGH (ref 4.0–10.5)
nRBC: 0 % (ref 0.0–0.2)

## 2020-02-27 LAB — BASIC METABOLIC PANEL
Anion gap: 13 (ref 5–15)
BUN: 9 mg/dL (ref 8–23)
CO2: 29 mmol/L (ref 22–32)
Calcium: 9.6 mg/dL (ref 8.9–10.3)
Chloride: 97 mmol/L — ABNORMAL LOW (ref 98–111)
Creatinine, Ser: 0.99 mg/dL (ref 0.44–1.00)
GFR calc Af Amer: >60 mL/min (ref >60)
GFR calc non Af Amer: 56 mL/min — ABNORMAL LOW (ref >60)
Glucose, Bld: 151 mg/dL — ABNORMAL HIGH (ref 70–99)
Potassium: 3.9 mmol/L (ref 3.5–5.1)
Sodium: 139 mmol/L (ref 135–145)

## 2020-02-27 MED ORDER — HYDROCODONE-ACETAMINOPHEN 5-325 MG PO TABS: 1.0000 | ORAL_TABLET | ORAL | 0 refills | Status: DC | PRN

## 2020-02-27 MED ORDER — CLINDAMYCIN HCL 300 MG PO CAPS: 300.0000 mg | ORAL_CAPSULE | Freq: Four times a day (QID) | ORAL | 0 refills | Status: DC

## 2020-02-27 MED ORDER — MORPHINE SULFATE (PF) 4 MG/ML IV SOLN
4.0000 mg | Freq: Once | INTRAVENOUS | Status: AC
  Administered 2020-02-27: 4 mg via INTRAVENOUS
  Filled 2020-02-27: qty 1

## 2020-02-27 MED ORDER — ACETAMINOPHEN 500 MG PO TABS
1000.0000 mg | ORAL_TABLET | Freq: Once | ORAL | Status: AC
  Administered 2020-02-27: 08:00:00 1000 mg via ORAL
  Filled 2020-02-27: qty 2

## 2020-02-27 MED ORDER — ONDANSETRON HCL 4 MG/2ML IJ SOLN
4.0000 mg | Freq: Once | INTRAMUSCULAR | Status: AC
  Administered 2020-02-27: 4 mg via INTRAVENOUS
  Filled 2020-02-27: qty 2

## 2020-02-27 MED ORDER — CLINDAMYCIN PHOSPHATE 600 MG/50ML IV SOLN
600.0000 mg | Freq: Once | INTRAVENOUS | Status: AC
  Administered 2020-02-27: 600 mg via INTRAVENOUS
  Filled 2020-02-27: qty 50

## 2020-02-27 MED ORDER — SODIUM CHLORIDE 0.9 % IV BOLUS
500.0000 mL | Freq: Once | INTRAVENOUS | Status: AC
  Administered 2020-02-27: 500 mL via INTRAVENOUS

## 2020-02-27 NOTE — ED Triage Notes (Signed)
Patient reports left lower molar pain for several days .

## 2020-02-27 NOTE — ED Provider Notes (Signed)
[name] EMERGENCY DEPARTMENT Provider Note   CSN: [id number] Arrival date & time: 02/27/20  0404     History Chief Complaint  Patient presents with  . Dental Pain    Beth Hubbard is a 74 y.o. female.  Pt presents to the ED today with left sided dental pain.  Pt said it has been hurting for several days.  She has not been able to eat due to the pain.  She has a Pharmacist, community, but has not called them.        Past Medical History:  Diagnosis Date  . Arthritis   . Diabetes mellitus without complication ([place])   . Excessive daytime sleepiness   . Glaucoma   . High cholesterol   . Hypertension   . Mitral regurgitation    mild by echo 03/2017  . OSA (obstructive sleep apnea) 10/18/2015   Mild OSA with AHI 12.5/hr on CPAP at 8cm H2O  . Pneumonia   . Shingles   . TIA (transient ischemic attack)     Patient Active Problem List   Diagnosis Date Noted  . OSA (obstructive sleep apnea) 10/18/2015  . Mitral regurgitation 05/18/2015  . Excessive daytime sleepiness 02/28/2015  . TIA (transient ischemic attack)   . Hypertension   . Diabetes mellitus without complication ([place])   . High cholesterol     Past Surgical History:  Procedure Laterality Date  . ABDOMINAL SURGERY    . EYE SURGERY Bilateral    cataract surgery  . ROTATOR CUFF REPAIR     bilateral  . TEE WITHOUT CARDIOVERSION N/A 07/20/2016   Procedure: TRANSESOPHAGEAL ECHOCARDIOGRAM (TEE);  Surgeon: Pixie Casino, MD;  Location: [facility];  Service: Cardiovascular;  Laterality: N/A;  . TOOTH EXTRACTION Left 10/09/2019   Procedure: DENTAL EXTRACTION X1 and Irrigation and Debridement.;  Surgeon: Diona Browner, DDS;  Location: [facility];  Service: Oral Surgery;  Laterality: Left;  DENTAL EXTRACTION X1 and Irrigation and Debridement.     OB History   No obstetric history on file.     Family History  Problem Relation Age of Onset  . Cancer Father   . Cancer Sister   . Cancer Brother      Social History   Tobacco Use  . Smoking status: Never Smoker  . Smokeless tobacco: Never Used  Substance Use Topics  . Alcohol use: No  . Drug use: No    Home Medications Prior to Admission medications   Medication Sig Start Date End Date Taking? Authorizing Provider  ACCU-CHEK AVIVA PLUS test strip Reported on 02/24/2016 03/05/16   Micheline Chapman, NP  ACCU-CHEK SOFTCLIX LANCETS lancets Check BS once or twice a day. 03/05/16   Micheline Chapman, NP  amLODipine (NORVASC) 5 MG tablet TAKE 1 TABLET(5 MG) BY MOUTH DAILY Patient taking differently: Take 5 mg by mouth daily.  07/03/19   Lanae Boast, FNP  Blood Glucose Monitoring Suppl (ACCU-CHEK AVIVA PLUS) w/Device KIT 1 kit by Does not apply route as directed. Check 1 to 2 times daily 03/06/16   Micheline Chapman, NP  clindamycin (CLEOCIN) 300 MG capsule Take 1 capsule (300 mg total) by mouth every 6 (six) hours. 02/27/20   Isla Pence, MD  COMBIGAN 0.2-0.5 % ophthalmic solution Place 1 drop into both eyes 2 (two) times daily.  03/28/15   [provider]  cyclobenzaprine (FLEXERIL) 10 MG tablet TAKE 1 TABLET(10 MG) BY MOUTH THREE TIMES DAILY AS NEEDED FOR MUSCLE SPASMS Patient taking differently: Take  10 mg by mouth 3 (three) times daily.  11/13/18   Lanae Boast, FNP  cycloSPORINE (RESTASIS) 0.05 % ophthalmic emulsion Place 1 drop into both eyes 2 (two) times daily.     [provider]  dorzolamide (TRUSOPT) 2 % ophthalmic solution Place 1 drop into both eyes 2 (two) times daily.  07/10/19   [provider]  ferrous sulfate 325 (65 FE) MG tablet Take 325 mg by mouth daily.     [provider]  gabapentin (NEURONTIN) 300 MG capsule TAKE 1 CAPSULE BY MOUTH EVERY NIGHT AT BEDTIME 10/22/19   Jegede, Marlena Clipper, MD  HYDROcodone-acetaminophen (NORCO/VICODIN) 5-325 MG tablet Take 1 tablet by mouth every 4 (four) hours as needed. 02/27/20   Isla Pence, MD  oxyCODONE-acetaminophen (PERCOCET) 5-325 MG  tablet Take 1 tablet by mouth every 4 (four) hours as needed. Patient not taking: Reported on 12/25/2019 10/09/19   Diona Browner, DDS  ROCKLATAN 0.02-0.005 % SOLN Place 1 drop into both eyes at bedtime. 09/07/19   [provider]  rosuvastatin (CRESTOR) 20 MG tablet Take 1 tablet (20 mg total) by mouth daily. 01/14/19   Azzie Glatter, FNP  sitaGLIPtin (JANUVIA) 50 MG tablet TAKE 1 TABLET(50 MG) BY MOUTH DAILY 11/30/19   Tresa Garter, MD  traZODone (DESYREL) 50 MG tablet TAKE 1 TABLET BY MOUTH AT BEDTIME AS NEEDED FOR SLEEP 01/20/20   Vevelyn Francois, NP    Allergies    Aspirin, Penicillins, and Nsaids  Review of Systems   Review of Systems  HENT: Positive for dental problem.   All other systems reviewed and are negative.   Physical Exam Updated Vital Signs BP 130/80   Pulse 78   Temp 98.6 F (37 C) (Oral)   Resp 15   Ht 5' (1.524 m)   Wt 80 kg   SpO2 98%   BMI 34.44 kg/m   Physical Exam Vitals and nursing note reviewed.  Constitutional:      Appearance: Normal appearance. She is obese.  HENT:     Head: Normocephalic and atraumatic.     Right Ear: External ear normal.     Left Ear: External ear normal.     Nose: Nose normal.     Mouth/Throat:     Mouth: Mucous membranes are moist.     Comments: Left facial redness and swelling.  No Ludwig's, but pt unable to fully open her mouth.  No abscess. Eyes:     Extraocular Movements: Extraocular movements intact.     Conjunctiva/sclera: Conjunctivae normal.     Pupils: Pupils are equal, round, and reactive to light.  Cardiovascular:     Rate and Rhythm: Normal rate and regular rhythm.     Pulses: Normal pulses.     Heart sounds: Normal heart sounds.  Pulmonary:     Effort: Pulmonary effort is normal.     Breath sounds: Normal breath sounds.  Abdominal:     General: Abdomen is flat. Bowel sounds are normal.     Palpations: Abdomen is soft.  Musculoskeletal:        General: Normal range of motion.      Cervical back: Normal range of motion and neck supple.  Skin:    General: Skin is warm.     Capillary Refill: Capillary refill takes less than 2 seconds.  Neurological:     General: No focal deficit present.     Mental Status: She is alert and oriented to person, place, and time.  Psychiatric:  Mood and Affect: Mood normal.        Behavior: Behavior normal.        Thought Content: Thought content normal.        Judgment: Judgment normal.     ED Results / Procedures / Treatments   Labs (all labs ordered are listed, but only abnormal results are displayed) Labs Reviewed  CBC WITH DIFFERENTIAL/PLATELET - Abnormal; Notable for the following components:      Result Value   WBC 12.6 (*)    Neutro Abs 9.5 (*)    Monocytes Absolute 1.2 (*)    All other components within normal limits  BASIC METABOLIC PANEL - Abnormal; Notable for the following components:   Chloride 97 (*)    Glucose, Bld 151 (*)    GFR calc non Af Amer 56 (*)    All other components within normal limits    EKG None  Radiology No results found.  Procedures Procedures (including critical care time)  Medications Ordered in ED Medications  sodium chloride 0.9 % bolus 500 mL (500 mLs Intravenous New Bag/Given 02/27/20 0812)  clindamycin (CLEOCIN) IVPB 600 mg (600 mg Intravenous New Bag/Given 02/27/20 0815)  ondansetron (ZOFRAN) injection 4 mg (4 mg Intravenous Given 02/27/20 0810)  morphine 4 MG/ML injection 4 mg (4 mg Intravenous Given 02/27/20 0813)  acetaminophen (TYLENOL) tablet 1,000 mg (1,000 mg Oral Given 02/27/20 0809)    ED Course  I have reviewed the triage vital signs and the nursing notes.  Pertinent labs & imaging results that were available during my care of the patient were reviewed by me and considered in my medical decision making (see chart for details).    MDM Rules/Calculators/A&P                      Pt is feeling much better after treatment.  She is able to open her mouth and  looks better.  She knows to f/u with Dr. Hoyt Koch (oral surgery).  Return if worse.  Final Clinical Impression(s) / ED Diagnoses Final diagnoses:  Dental caries    Rx / DC Orders ED Discharge Orders         Ordered    HYDROcodone-acetaminophen (NORCO/VICODIN) 5-325 MG tablet  Every 4 hours PRN     02/27/20 0914    clindamycin (CLEOCIN) 300 MG capsule  Every 6 hours     02/27/20 0914           Isla Pence, MD 02/27/20 [phone]

## 2020-03-01 ENCOUNTER — Other Ambulatory Visit: Payer: Self-pay | Admitting: Family Medicine

## 2020-03-01 DIAGNOSIS — E785 Hyperlipidemia, unspecified: Secondary | ICD-10-CM

## 2020-04-04 ENCOUNTER — Telehealth: Payer: Self-pay | Admitting: Nurse Practitioner

## 2020-04-04 NOTE — Telephone Encounter (Signed)
Called and advised pt of  this  

## 2020-04-04 NOTE — Telephone Encounter (Signed)
Pt wants to know if she can get the covid vaccine considering her high BP and cholesterol.

## 2020-04-12 ENCOUNTER — Telehealth: Payer: Self-pay | Admitting: Cardiology

## 2020-04-12 NOTE — Telephone Encounter (Signed)
   Pt c/o of Chest Pain: STAT if CP now or developed within 24 hours  1. Are you having CP right now? No  2. Are you experiencing any other symptoms (ex. SOB, nausea, vomiting, sweating)? DOB during chest pain  3. How long have you been experiencing CP? 2 - 3 weeks  4. Is your CP continuous or coming and going? Comes and go  5. Have you taken Nitroglycerin? No ? Pt and her daughter calling, pt been feeling pain on her left chest under her breast. Pt said it's been going on for 2-3 weeks and it comes and go, when it hurst she has difficulty breathing. Pt has appt set up with Dr. Radford Pax tomorrow but still would like to check with nurse today

## 2020-04-12 NOTE — Telephone Encounter (Signed)
Spoke with the patient who states that she has experienced some intermittent sharp pain on the left side of her chest under her breast. She states that is has happened about 4 times in the past two weeks. She states that it has happened when she talks a lot and becomes short of breath. She denies any headaches, vomiting, nausea, dizziness, or weakness. She states that it feels like a muscle cramp. Yesterday it occurred when she was lying down and she found relief when she got up. She denies any chest pain today. Last time she took her BP it was 150/80.  Patient has an appointment tomorrow with Dr. Radford Pax.

## 2020-04-13 ENCOUNTER — Encounter: Payer: Self-pay | Admitting: Cardiology

## 2020-04-13 ENCOUNTER — Ambulatory Visit (INDEPENDENT_AMBULATORY_CARE_PROVIDER_SITE_OTHER): Payer: Medicare Other | Admitting: Cardiology

## 2020-04-13 ENCOUNTER — Other Ambulatory Visit: Payer: Self-pay

## 2020-04-13 VITALS — BP 124/64 | HR 91 | Ht 60.0 in | Wt 162.0 lb

## 2020-04-13 DIAGNOSIS — I10 Essential (primary) hypertension: Secondary | ICD-10-CM

## 2020-04-13 DIAGNOSIS — E669 Obesity, unspecified: Secondary | ICD-10-CM | POA: Diagnosis not present

## 2020-04-13 DIAGNOSIS — G4733 Obstructive sleep apnea (adult) (pediatric): Secondary | ICD-10-CM

## 2020-04-13 DIAGNOSIS — R0789 Other chest pain: Secondary | ICD-10-CM

## 2020-04-13 NOTE — Patient Instructions (Signed)

## 2020-04-13 NOTE — Progress Notes (Signed)
Cardiology Office Note:    Date:  04/13/2020   ID:  Beth Hubbard, DOB [birth year], MRN [id number]  PCP:  Lanae Boast, [place]  Cardiologist:  No primary care provider on file.    Referring MD: Lanae Boast, FNP   Chief Complaint  Patient presents with  . Sleep Apnea    History of Present Illness:    Beth Hubbard is a 74 y.o. female with a hx ofmild OSA with an AHI of 11.9/hr with oxygen desaturations as low as 83% and is on CPAP at15cm H2O.   She is doing well with her CPAP device and thinks that she has gotten used to it.  She tolerates the mask and feels the pressure is adequate.  Since going on CPAP she feels rested in the am and has no significant daytime sleepiness.  She denies any significant mouth or nasal dryness or nasal congestion.  She does not think that he snores.    She tells me that about 2 weeks ago she developed a sharp cramp under her left breast and then had it again yesterday.  It lasted about 2-3 minutes and worse with deep breathing and moving her left arm.  It finally resolved after drinking water.  She denies any diaphoresis or nausea with the sharp pain.  She has broken her left shoulder in the past and has chronic shoulder and left arm pain.  She denies any DOE, PND, orthopnea, LE edema, dizziness or syncope.    Past Medical History:  Diagnosis Date  . Arthritis   . Diabetes mellitus without complication ([facility])   . Excessive daytime sleepiness   . Glaucoma   . High cholesterol   . Hypertension   . Mitral regurgitation    mild by echo 03/2017  . OSA (obstructive sleep apnea) 10/18/2015   Mild OSA with AHI 12.5/hr on CPAP at 8cm H2O  . Pneumonia   . Shingles   . TIA (transient ischemic attack)     Past Surgical History:  Procedure Laterality Date  . ABDOMINAL SURGERY    . EYE SURGERY Bilateral    cataract surgery  . ROTATOR CUFF REPAIR     bilateral  . TEE WITHOUT CARDIOVERSION N/A 07/20/2016   Procedure: TRANSESOPHAGEAL ECHOCARDIOGRAM  (TEE);  Surgeon: Pixie Casino, MD;  Location: [facility];  Service: Cardiovascular;  Laterality: N/A;  . TOOTH EXTRACTION Left 10/09/2019   Procedure: DENTAL EXTRACTION X1 and Irrigation and Debridement.;  Surgeon: Diona Browner, DDS;  Location: [facility];  Service: Oral Surgery;  Laterality: Left;  DENTAL EXTRACTION X1 and Irrigation and Debridement.    Current Medications: Current Meds  Medication Sig  . ACCU-CHEK AVIVA PLUS test strip Reported on 02/24/2016  . ACCU-CHEK SOFTCLIX LANCETS lancets Check BS once or twice a day.  Marland Kitchen amLODipine (NORVASC) 5 MG tablet TAKE 1 TABLET(5 MG) BY MOUTH DAILY (Patient taking differently: Take 5 mg by mouth daily. )  . Blood Glucose Monitoring Suppl (ACCU-CHEK AVIVA PLUS) w/Device KIT 1 kit by Does not apply route as directed. Check 1 to 2 times daily  . clindamycin (CLEOCIN) 300 MG capsule Take 1 capsule (300 mg total) by mouth every 6 (six) hours.  . COMBIGAN 0.2-0.5 % ophthalmic solution Place 1 drop into both eyes 2 (two) times daily.   . cyclobenzaprine (FLEXERIL) 10 MG tablet TAKE 1 TABLET(10 MG) BY MOUTH THREE TIMES DAILY AS NEEDED FOR MUSCLE SPASMS (Patient taking differently: Take 10 mg by mouth 3 (three) times daily. )  .  cycloSPORINE (RESTASIS) 0.05 % ophthalmic emulsion Place 1 drop into both eyes 2 (two) times daily.   . dorzolamide (TRUSOPT) 2 % ophthalmic solution Place 1 drop into both eyes 2 (two) times daily.   . ferrous sulfate 325 (65 FE) MG tablet Take 325 mg by mouth daily.   Marland Kitchen gabapentin (NEURONTIN) 300 MG capsule TAKE 1 CAPSULE BY MOUTH EVERY NIGHT AT BEDTIME  . HYDROcodone-acetaminophen (NORCO/VICODIN) 5-325 MG tablet Take 1 tablet by mouth every 4 (four) hours as needed.  Marland Kitchen oxyCODONE-acetaminophen (PERCOCET) 5-325 MG tablet Take 1 tablet by mouth every 4 (four) hours as needed.  Marland Kitchen ROCKLATAN 0.02-0.005 % SOLN Place 1 drop into both eyes at bedtime.  . rosuvastatin (CRESTOR) 20 MG tablet TAKE 1 TABLET(20 MG) BY MOUTH DAILY  .  sitaGLIPtin (JANUVIA) 50 MG tablet TAKE 1 TABLET(50 MG) BY MOUTH DAILY  . traZODone (DESYREL) 50 MG tablet TAKE 1 TABLET BY MOUTH AT BEDTIME AS NEEDED FOR SLEEP     Allergies:   Aspirin, Penicillins, and Nsaids   Social History   Socioeconomic History  . Marital status: Legally Separated    Spouse name: Not on file  . Number of children: Not on file  . Years of education: Not on file  . Highest education level: Not on file  Occupational History  . Not on file  Tobacco Use  . Smoking status: Never Smoker  . Smokeless tobacco: Never Used  Substance and Sexual Activity  . Alcohol use: No  . Drug use: No  . Sexual activity: Not on file  Other Topics Concern  . Not on file  Social History Narrative  . Not on file   Social Determinants of Health   Financial Resource Strain:   . Difficulty of Paying Living Expenses:   Food Insecurity:   . Worried About Charity fundraiser in the Last Year:   . Arboriculturist in the Last Year:   Transportation Needs:   . Film/video editor (Medical):   Marland Kitchen Lack of Transportation (Non-Medical):   Physical Activity:   . Days of Exercise per Week:   . Minutes of Exercise per Session:   Stress:   . Feeling of Stress :   Social Connections:   . Frequency of Communication with Friends and Family:   . Frequency of Social Gatherings with Friends and Family:   . Attends Religious Services:   . Active Member of Clubs or Organizations:   . Attends Archivist Meetings:   Marland Kitchen Marital Status:      Family History: The patient's family history includes Cancer in her brother, father, and sister.  ROS:   Please see the history of present illness.    ROS  All other systems reviewed and negative.   EKGs/Labs/Other Studies Reviewed:    The following studies were reviewed today: PAP compliance download  EKG:  EKG is not ordered today.   Recent Labs: 07/03/2019: ALT 14 02/27/2020: BUN 9; Creatinine, Ser 0.99; Hemoglobin 13.1; Platelets  253; Potassium 3.9; Sodium 139   Recent Lipid Panel    Component Value Date/Time   CHOL 172 07/03/2019 1037   TRIG 190 (H) 07/03/2019 1037   HDL 51 07/03/2019 1037   CHOLHDL 3.4 07/03/2019 1037   CHOLHDL 2.7 04/08/2017 0909   VLDL 27 04/08/2017 0909   LDLCALC 83 07/03/2019 1037    Physical Exam:    VS:  BP 124/64   Pulse 91   Ht 5' (1.524 m)   [facility]  162 lb (73.5 kg)   SpO2 98%   BMI 31.64 kg/m     Wt Readings from Last 3 Encounters:  04/13/20 162 lb (73.5 kg)  02/27/20 176 lb 5.9 oz (80 kg)  12/25/19 169 lb (76.7 kg)     GEN:  Well nourished, well developed in no acute distress HEENT: Normal NECK: No JVD; No carotid bruits LYMPHATICS: No lymphadenopathy CARDIAC: RRR, no murmurs, rubs, gallops RESPIRATORY:  Clear to auscultation without rales, wheezing or rhonchi  ABDOMEN: Soft, non-tender, non-distended MUSCULOSKELETAL:  No edema; No deformity  SKIN: Warm and dry NEUROLOGIC:  Alert and oriented x 3 PSYCHIATRIC:  Normal affect   ASSESSMENT:    1. OSA (obstructive sleep apnea)   2. Essential hypertension   3. Obesity (BMI 30-39.9)   4. Musculoskeletal chest pain    PLAN:    In order of problems listed above:  1.  OSA -The patient is tolerating PAP therapy well without any problems. The PAP download was reviewed today and showed an AHI of 1.2/hr on auto cm H2O with 93% compliance in using more than 4 hours nightly.  The patient has been using and benefiting from PAP use and will continue to benefit from therapy.  -she thinks the mask is leaking and needs to change out the cushion  2.  HTN -Bp controlled on exam today -continue amlodipine 14m daily -encouraged to follow < 2gm Na diet  3.  Obesity -I have encouraged her to get into a routine exercise program and cut back on carbs and portions.   4.  Atypical Chest pain -she has some atypical sharp CP under her left breast at times that is worse with deep breathing and movement of her left arm and suspect that  this is musculoskeletal in origin.   -No chest pain with exertion and EKG is normal -no further workup at this time   Medication Adjustments/Labs and Tests Ordered: Current medicines are reviewed at length with the patient today.  Concerns regarding medicines are outlined above.  Orders Placed This Encounter  Procedures  . EKG 12-Lead   No orders of the defined types were placed in this encounter.   Signed, TFransico Him MD  04/13/2020 2:36 PM    [place]

## 2020-05-16 ENCOUNTER — Other Ambulatory Visit: Payer: Self-pay | Admitting: Nurse Practitioner

## 2020-05-16 DIAGNOSIS — E785 Hyperlipidemia, unspecified: Secondary | ICD-10-CM

## 2020-05-26 NOTE — Telephone Encounter (Signed)
In error

## 2020-05-27 ENCOUNTER — Ambulatory Visit: Payer: Medicare Other | Admitting: Nurse Practitioner

## 2020-06-24 ENCOUNTER — Other Ambulatory Visit: Payer: Self-pay

## 2020-06-24 ENCOUNTER — Ambulatory Visit (INDEPENDENT_AMBULATORY_CARE_PROVIDER_SITE_OTHER): Payer: Medicare Other | Admitting: Nurse Practitioner

## 2020-06-24 VITALS — BP 146/52 | HR 88 | Temp 98.5°F | Resp 16 | Ht 61.0 in | Wt 166.0 lb

## 2020-06-24 DIAGNOSIS — I10 Essential (primary) hypertension: Secondary | ICD-10-CM

## 2020-06-24 DIAGNOSIS — G4700 Insomnia, unspecified: Secondary | ICD-10-CM | POA: Diagnosis not present

## 2020-06-24 DIAGNOSIS — E119 Type 2 diabetes mellitus without complications: Secondary | ICD-10-CM

## 2020-06-24 LAB — POCT URINALYSIS DIPSTICK
Bilirubin, UA: NEGATIVE
Glucose, UA: NEGATIVE
Ketones, UA: NEGATIVE
Nitrite, UA: NEGATIVE
Protein, UA: POSITIVE — AB
Spec Grav, UA: 1.015 (ref 1.010–1.025)
Urobilinogen, UA: 0.2 E.U./dL
pH, UA: 5.5 (ref 5.0–8.0)

## 2020-06-24 LAB — POCT GLYCOSYLATED HEMOGLOBIN (HGB A1C): Hemoglobin A1C: 6.3 % — AB (ref 4.0–5.6)

## 2020-06-24 MED ORDER — AMLODIPINE BESYLATE 5 MG PO TABS: ORAL_TABLET | ORAL | 3 refills | Status: DC

## 2020-06-24 MED ORDER — TRAZODONE HCL 50 MG PO TABS: ORAL_TABLET | ORAL | 1 refills | Status: DC

## 2020-06-24 NOTE — Progress Notes (Signed)
° °[facility] °[address] °[place], [place]  [address] °Phone:  [phone]   Fax:  [phone] ° ° °Established Patient Office Visit ° °Subjective:  °Patient ID: Beth Hubbard, female    DOB: 10/09/1946  Age: 74 y.o. MRN: [id number] ° °CC:  °Chief Complaint  °Patient presents with  °• Hypertension  °• Medication Refill  °  blood pressure and trazadone.   ° ° °HPI °Beth Hubbard presents for follow up.  She  has a past medical history of Arthritis, Diabetes mellitus without complication (HCC), Excessive daytime sleepiness, Glaucoma, High cholesterol, Hypertension, Mitral regurgitation, OSA (obstructive sleep apnea) (10/18/2015), Pneumonia, Shingles, and TIA (transient ischemic attack).  ° °Hypertension °Patient is here for follow-up of elevated blood pressure. She is not exercising and is adherent to a low-salt diet. Blood pressure is well controlled at home. Cardiac symptoms: none. Patient denies chest pain, dyspnea, irregular heart beat, lower extremity edema, palpitations and syncope. Cardiovascular risk factors: advanced age (older than 55 for men, 65 for women), diabetes mellitus, dyslipidemia, hypertension, obesity (BMI >= 30 kg/m2) and sedentary lifestyle. Use of agents associated with hypertension: none. History of target organ damage: transient ischemic attack and Mitral regurgitation, OSA Glaucoma. ° ° °Past Medical History:  °Diagnosis Date  °• Arthritis   °• Diabetes mellitus without complication (HCC)   °• Excessive daytime sleepiness   °• Glaucoma   °• High cholesterol   °• Hypertension   °• Mitral regurgitation   ° mild by echo 03/2017  °• OSA (obstructive sleep apnea) 10/18/2015  ° Mild OSA with AHI 12.5/hr on CPAP at 8cm H2O  °• Pneumonia   °• Shingles   °• TIA (transient ischemic attack)   ° ° °Past Surgical History:  °Procedure Laterality Date  °• ABDOMINAL SURGERY    °• EYE SURGERY Bilateral   ° cataract surgery  °• ROTATOR CUFF REPAIR    ° bilateral  °• TEE WITHOUT  CARDIOVERSION N/A 07/20/2016  ° Procedure: TRANSESOPHAGEAL ECHOCARDIOGRAM (TEE);  Surgeon: Kenneth C Hilty, MD;  Location: [facility];  Service: Cardiovascular;  Laterality: N/A;  °• TOOTH EXTRACTION Left 10/09/2019  ° Procedure: DENTAL EXTRACTION X1 and Irrigation and Debridement.;  Surgeon: Jensen, Scott, DDS;  Location: [facility];  Service: Oral Surgery;  Laterality: Left;  DENTAL EXTRACTION X1 and Irrigation and Debridement.  ° ° °Family History  °Problem Relation Age of Onset  °• Cancer Father   °• Cancer Sister   °• Cancer Brother   ° ° °Social History  ° °Socioeconomic History  °• Marital status: Legally Separated  °  Spouse name: Not on file  °• Number of children: Not on file  °• Years of education: Not on file  °• Highest education level: Not on file  °Occupational History  °• Not on file  °Tobacco Use  °• Smoking status: Never Smoker  °• Smokeless tobacco: Never Used  °Vaping Use  °• Vaping Use: Never used  °Substance and Sexual Activity  °• Alcohol use: No  °• Drug use: No  °• Sexual activity: Not on file  °Other Topics Concern  °• Not on file  °Social History Narrative  °• Not on file  ° °Social Determinants of Health  ° °Financial Resource Strain:   °• Difficulty of Paying Living Expenses:   °Food Insecurity:   °• Worried About Running Out of Food in the Last Year:   °• Ran Out of Food in the Last Year:   °Transportation Needs:   °•   Lack of Transportation (Medical):   °• Lack of Transportation (Non-Medical):   °Physical Activity:   °• Days of Exercise per Week:   °• Minutes of Exercise per Session:   °Stress:   °• Feeling of Stress :   °Social Connections:   °• Frequency of Communication with Friends and Family:   °• Frequency of Social Gatherings with Friends and Family:   °• Attends Religious Services:   °• Active Member of Clubs or Organizations:   °• Attends Club or Organization Meetings:   °• Marital Status:   °Intimate Partner Violence:   °• Fear of Current or Ex-Partner:   °• Emotionally Abused:     °• Physically Abused:   °• Sexually Abused:   ° ° °Outpatient Medications Prior to Visit  °Medication Sig Dispense Refill  °• ACCU-CHEK AVIVA PLUS test strip Reported on 02/24/2016 100 each 1  °• ACCU-CHEK SOFTCLIX LANCETS lancets Check BS once or twice a day. 100 each 1  °• Blood Glucose Monitoring Suppl (ACCU-CHEK AVIVA PLUS) w/Device KIT 1 kit by Does not apply route as directed. Check 1 to 2 times daily 1 kit 0  °• COMBIGAN 0.2-0.5 % ophthalmic solution Place 1 drop into both eyes 2 (two) times daily.     °• cyclobenzaprine (FLEXERIL) 10 MG tablet TAKE 1 TABLET(10 MG) BY MOUTH THREE TIMES DAILY AS NEEDED FOR MUSCLE SPASMS (Patient taking differently: Take 10 mg by mouth 3 (three) times daily. ) 30 tablet 0  °• cycloSPORINE (RESTASIS) 0.05 % ophthalmic emulsion Place 1 drop into both eyes 2 (two) times daily.     °• dorzolamide (TRUSOPT) 2 % ophthalmic solution Place 1 drop into both eyes 2 (two) times daily.     °• ferrous sulfate 325 (65 FE) MG tablet Take 325 mg by mouth daily.     °• gabapentin (NEURONTIN) 300 MG capsule TAKE 1 CAPSULE BY MOUTH EVERY NIGHT AT BEDTIME 90 capsule 1  °• ROCKLATAN 0.02-0.005 % SOLN Place 1 drop into both eyes at bedtime.    °• rosuvastatin (CRESTOR) 20 MG tablet TAKE 1 TABLET(20 MG) BY MOUTH DAILY 90 tablet 3  °• sitaGLIPtin (JANUVIA) 50 MG tablet TAKE 1 TABLET(50 MG) BY MOUTH DAILY 90 tablet 3  °• amLODipine (NORVASC) 5 MG tablet TAKE 1 TABLET(5 MG) BY MOUTH DAILY (Patient taking differently: Take 5 mg by mouth daily. ) 90 tablet 3  °• HYDROcodone-acetaminophen (NORCO/VICODIN) 5-325 MG tablet Take 1 tablet by mouth every 4 (four) hours as needed. 10 tablet 0  °• oxyCODONE-acetaminophen (PERCOCET) 5-325 MG tablet Take 1 tablet by mouth every 4 (four) hours as needed. 30 tablet 0  °• traZODone (DESYREL) 50 MG tablet TAKE 1 TABLET BY MOUTH AT BEDTIME AS NEEDED FOR SLEEP 90 tablet 1  °• clindamycin (CLEOCIN) 300 MG capsule Take 1 capsule (300 mg total) by mouth every 6 (six) hours.  28 capsule 0  ° °No facility-administered medications prior to visit.  ° ° °Allergies  °Allergen Reactions  °• Aspirin   °  Tremor °  °• Penicillins Itching and Other (See Comments)  °  Reaction: itching,headache, dizziness and anxiety. Feels like she is "goin off" °Did it involve swelling of the face/tongue/throat, SOB, or low BP? No °Did it involve sudden or severe rash/hives, skin peeling, or any reaction on the inside of your mouth or nose? No °Did you need to seek medical attention at a hospital or doctor's office? No °When did it last happen?      Childhood °If all above answers are “NO”, may   proceed with cephalosporin use.   Nsaids Anxiety    Reaction: causes dizziness and headache. Ibuprofen. Aspirin    ROS Review of Systems  Musculoskeletal: Positive for arthralgias and joint swelling.       Hx of fall       Objective:    Physical Exam Constitutional:      General: She is not in acute distress.    Appearance: She is not ill-appearing, toxic-appearing or diaphoretic.  HENT:     Head: Normocephalic and atraumatic.     Nose: Nose normal.     Mouth/Throat:     Mouth: Mucous membranes are moist.  Cardiovascular:     Rate and Rhythm: Normal rate and regular rhythm.     Pulses: Normal pulses.          Dorsalis pedis pulses are 2+ on the right side and 2+ on the left side.       Posterior tibial pulses are 2+ on the right side and 2+ on the left side.     Heart sounds: Normal heart sounds.  Pulmonary:     Effort: Pulmonary effort is normal.     Breath sounds: Normal breath sounds.  Abdominal:     General: Bowel sounds are normal.  Musculoskeletal:        General: Normal range of motion.     Cervical back: Normal range of motion.  Feet:     Right foot:     Protective Sensation: 10 sites tested. 10 sites sensed.     Skin integrity: Skin integrity normal.     Toenail Condition: Right toenails are normal.     Left foot:     Protective Sensation: 10 sites tested. 10 sites  sensed.     Skin integrity: Skin integrity normal.     Toenail Condition: Left toenails are normal.  Skin:    General: Skin is warm.     Capillary Refill: Capillary refill takes less than 2 seconds.  Neurological:     General: No focal deficit present.     Mental Status: She is alert and oriented to person, place, and time.  Psychiatric:        Mood and Affect: Mood normal.        Behavior: Behavior normal.        Thought Content: Thought content normal.        Judgment: Judgment normal.     BP (!) 146/52 (BP Location: Right Arm, Patient Position: Sitting, Cuff Size: Large)    Pulse 88    Temp 98.5 F (36.9 C) (Oral)    Resp 16    Ht 5' 1" (1.549 m)    Wt 166 lb (75.3 kg)    SpO2 98%    BMI 31.37 kg/m  Wt Readings from Last 3 Encounters:  06/24/20 166 lb (75.3 kg)  04/13/20 162 lb (73.5 kg)  02/27/20 176 lb 5.9 oz (80 kg)     There are no preventive care reminders to display for this patient.  There are no preventive care reminders to display for this patient.  Lab Results  Component Value Date   TSH 1.27 03/05/2016   Lab Results  Component Value Date   WBC 12.6 (H) 02/27/2020   HGB 13.1 02/27/2020   HCT 40.5 02/27/2020   MCV 96.4 02/27/2020   PLT 253 02/27/2020   Lab Results  Component Value Date   NA 143 06/24/2020   K 4.0 06/24/2020   CO2 29 02/27/2020   GLUCOSE  105 (H) 06/24/2020   BUN 11 06/24/2020   CREATININE 0.87 06/24/2020   BILITOT <0.2 06/24/2020   ALKPHOS 60 06/24/2020   AST 20 06/24/2020   ALT 14 07/03/2019   PROT 7.1 06/24/2020   ALBUMIN 4.4 06/24/2020   CALCIUM 9.3 06/24/2020   ANIONGAP 13 02/27/2020   Lab Results  Component Value Date   CHOL 173 06/24/2020   Lab Results  Component Value Date   HDL 54 06/24/2020   Lab Results  Component Value Date   LDLCALC 86 06/24/2020   Lab Results  Component Value Date   TRIG 197 (H) 06/24/2020   Lab Results  Component Value Date   CHOLHDL 3.2 06/24/2020   Lab Results  Component Value  Date   HGBA1C 6.3 (A) 06/24/2020      Assessment & Plan:   Problem List Items Addressed This Visit      Cardiovascular and Mediastinum   Hypertension - Primary Encouraged on going compliance with current medication regimen Encouraged home monitoring and recording BP <130/80 Eating a heart-healthy diet with less salt Encouraged regular physical activity  Recommend Weight loss      Relevant Medications   amLODipine (NORVASC) 5 MG tablet   Other Relevant Orders   Urinalysis Dipstick (Completed)   Comp. Metabolic Panel (12) (Completed)     Endocrine   Diabetes mellitus without complication (HCC) Encourage compliance with current treatment regimen   Encourage regular CBG monitoring Encourage contacting office if excessive hyperglycemia and or hypoglycemia Lifestyle modification with healthy diet (fewer calories, more high fiber foods, whole grains and non-starchy vegetables, lower fat meat and fish, low-fat diary include healthy oils) regular exercise (physical activity) and weight loss Opthalmology exam discussed  Nutritional consult recommended Regular dental visits encouraged Home BP monitoring also encouraged goal <130/80       Relevant Orders   Microalbumin / creatinine urine ratio (Completed)   Lipid panel (Completed)   HgB A1c (Completed)    Other Visit Diagnoses    Insomnia, unspecified type       Relevant Medications   traZODone (DESYREL) 50 MG tablet      Meds ordered this encounter  Medications   amLODipine (NORVASC) 5 MG tablet    Sig: TAKE 1 TABLET(5 MG) BY MOUTH DAILY    Dispense:  90 tablet    Refill:  3    Order Specific Question:   Supervising Provider    Answer:   JEFFERY, MICHAEL J [[id number]]   traZODone (DESYREL) 50 MG tablet    Sig: TAKE 1 TABLET BY MOUTH AT BEDTIME AS NEEDED FOR SLEEP    Dispense:  90 tablet    Refill:  1    Order Specific Question:   Supervising Provider    Answer:   Silvestre Gunner J [[id number]]    Follow-up: Return in  about 3 months (around 09/24/2020).    Vevelyn Francois, NP

## 2020-06-25 LAB — COMP. METABOLIC PANEL (12)
AST: 20 IU/L (ref 0–40)
Albumin/Globulin Ratio: 1.6 (ref 1.2–2.2)
Albumin: 4.4 g/dL (ref 3.7–4.7)
Alkaline Phosphatase: 60 IU/L (ref 48–121)
BUN/Creatinine Ratio: 13 (ref 12–28)
BUN: 11 mg/dL (ref 8–27)
Bilirubin Total: <0.2 mg/dL (ref 0.0–1.2)
Calcium: 9.3 mg/dL (ref 8.7–10.3)
Chloride: 100 mmol/L (ref 96–106)
Creatinine, Ser: 0.87 mg/dL (ref 0.57–1.00)
GFR calc Af Amer: 76 mL/min/{1.73_m2} (ref >59)
GFR calc non Af Amer: 66 mL/min/{1.73_m2} (ref >59)
Globulin, Total: 2.7 g/dL (ref 1.5–4.5)
Glucose: 105 mg/dL — ABNORMAL HIGH (ref 65–99)
Potassium: 4 mmol/L (ref 3.5–5.2)
Sodium: 143 mmol/L (ref 134–144)
Total Protein: 7.1 g/dL (ref 6.0–8.5)

## 2020-06-25 LAB — LIPID PANEL
Chol/HDL Ratio: 3.2 ratio (ref 0.0–4.4)
Cholesterol, Total: 173 mg/dL (ref 100–199)
HDL: 54 mg/dL (ref >39)
LDL Chol Calc (NIH): 86 mg/dL (ref 0–99)
Triglycerides: 197 mg/dL — ABNORMAL HIGH (ref 0–149)
VLDL Cholesterol Cal: 33 mg/dL (ref 5–40)

## 2020-06-25 LAB — MICROALBUMIN / CREATININE URINE RATIO
Creatinine, Urine: 113.3 mg/dL
Microalb/Creat Ratio: 46 mg/g creat — ABNORMAL HIGH (ref 0–29)
Microalbumin, Urine: 52 ug/mL

## 2020-06-29 ENCOUNTER — Encounter: Payer: Self-pay | Admitting: Nurse Practitioner

## 2020-06-29 ENCOUNTER — Other Ambulatory Visit: Payer: Self-pay | Admitting: Nurse Practitioner

## 2020-06-29 DIAGNOSIS — R809 Proteinuria, unspecified: Secondary | ICD-10-CM

## 2020-06-29 DIAGNOSIS — E1129 Type 2 diabetes mellitus with other diabetic kidney complication: Secondary | ICD-10-CM | POA: Insufficient documentation

## 2020-06-30 ENCOUNTER — Telehealth (HOSPITAL_COMMUNITY): Payer: Self-pay

## 2020-06-30 ENCOUNTER — Telehealth: Payer: Self-pay

## 2020-06-30 NOTE — Telephone Encounter (Signed)
-----   Message from Vevelyn Francois, NP sent at 06/29/2020  9:59 PM EDT ----- Please let the patient know that her labs are stable.  Please encourage her to make sure that she continues to watch her diet closely for those things that increase blood sugars and cholesterol.  Her triglycerides are still elevated.  I would like her to start an omega-3 over-the-counter like fish oil or Mega red to go along with her current regimen to see if we can help decrease her triglycerides.  Will reevaluate at her next visit please make sure that she comes in fasting and ready to void to recollect urine specimen thanks

## 2020-06-30 NOTE — Telephone Encounter (Signed)
Patient made aware and will start taking otc fish oil. Thanks!

## 2020-07-19 ENCOUNTER — Other Ambulatory Visit: Payer: Self-pay | Admitting: Nurse Practitioner

## 2020-07-19 DIAGNOSIS — Z1231 Encounter for screening mammogram for malignant neoplasm of breast: Secondary | ICD-10-CM

## 2020-07-26 DIAGNOSIS — M4802 Spinal stenosis, cervical region: Secondary | ICD-10-CM | POA: Diagnosis not present

## 2020-07-26 DIAGNOSIS — M4726 Other spondylosis with radiculopathy, lumbar region: Secondary | ICD-10-CM | POA: Diagnosis not present

## 2020-08-05 DIAGNOSIS — H401131 Primary open-angle glaucoma, bilateral, mild stage: Secondary | ICD-10-CM | POA: Diagnosis not present

## 2020-08-16 ENCOUNTER — Telehealth: Payer: Self-pay | Admitting: Cardiology

## 2020-08-16 NOTE — Telephone Encounter (Signed)
1) What problem are you experiencing? Believes pressure to high is stopping nose up in the mornings   2) Who is your medical equipment company? [employer] phone #:[phone]   Please route to the sleep study assistant.

## 2020-08-26 DIAGNOSIS — G4733 Obstructive sleep apnea (adult) (pediatric): Secondary | ICD-10-CM | POA: Diagnosis not present

## 2020-08-28 NOTE — Telephone Encounter (Signed)
Decrease auto CPAP to 4 to 11cm H2O and get a download in 2 weeks

## 2020-08-29 NOTE — Telephone Encounter (Signed)
Change was made via the modem by Gershon Cull. Two week download reminder made.

## 2020-09-09 ENCOUNTER — Ambulatory Visit
Admission: RE | Admit: 2020-09-09 | Discharge: 2020-09-09 | Disposition: A | Discharge status: Home / Self Care | Payer: Medicare Other | Source: Ambulatory Visit | Attending: Nurse Practitioner | Admitting: Nurse Practitioner

## 2020-09-09 ENCOUNTER — Other Ambulatory Visit: Payer: Self-pay

## 2020-09-09 DIAGNOSIS — Z1231 Encounter for screening mammogram for malignant neoplasm of breast: Secondary | ICD-10-CM

## 2020-09-16 DIAGNOSIS — H401131 Primary open-angle glaucoma, bilateral, mild stage: Secondary | ICD-10-CM | POA: Diagnosis not present

## 2020-09-21 ENCOUNTER — Other Ambulatory Visit: Payer: Self-pay | Admitting: Nurse Practitioner

## 2020-09-21 DIAGNOSIS — G4700 Insomnia, unspecified: Secondary | ICD-10-CM

## 2020-09-23 ENCOUNTER — Other Ambulatory Visit: Payer: Self-pay

## 2020-09-23 ENCOUNTER — Encounter: Payer: Self-pay | Admitting: Nurse Practitioner

## 2020-09-23 ENCOUNTER — Ambulatory Visit (INDEPENDENT_AMBULATORY_CARE_PROVIDER_SITE_OTHER): Payer: Medicare Other | Admitting: Nurse Practitioner

## 2020-09-23 VITALS — BP 136/69 | HR 83 | Temp 97.5°F | Resp 17 | Ht 64.0 in | Wt 164.8 lb

## 2020-09-23 DIAGNOSIS — R809 Proteinuria, unspecified: Secondary | ICD-10-CM

## 2020-09-23 DIAGNOSIS — E1129 Type 2 diabetes mellitus with other diabetic kidney complication: Secondary | ICD-10-CM

## 2020-09-23 DIAGNOSIS — G4700 Insomnia, unspecified: Secondary | ICD-10-CM

## 2020-09-23 DIAGNOSIS — Z23 Encounter for immunization: Secondary | ICD-10-CM

## 2020-09-23 DIAGNOSIS — E785 Hyperlipidemia, unspecified: Secondary | ICD-10-CM

## 2020-09-23 DIAGNOSIS — D509 Iron deficiency anemia, unspecified: Secondary | ICD-10-CM

## 2020-09-23 DIAGNOSIS — M542 Cervicalgia: Secondary | ICD-10-CM

## 2020-09-23 DIAGNOSIS — I272 Pulmonary hypertension, unspecified: Secondary | ICD-10-CM | POA: Insufficient documentation

## 2020-09-23 LAB — POCT GLYCOSYLATED HEMOGLOBIN (HGB A1C)
HbA1c POC (<> result, manual entry): 6.1 % (ref 4.0–5.6)
HbA1c, POC (controlled diabetic range): 6.1 % (ref 0.0–7.0)
HbA1c, POC (prediabetic range): 6.1 % (ref 5.7–6.4)
Hemoglobin A1C: 6.1 % — AB (ref 4.0–5.6)

## 2020-09-23 MED ORDER — ACCU-CHEK AVIVA PLUS VI STRP: ORAL_STRIP | 1 refills | Status: DC

## 2020-09-23 MED ORDER — ACCU-CHEK SOFTCLIX LANCETS MISC: 1 refills | Status: DC

## 2020-09-23 MED ORDER — ACCU-CHEK AVIVA PLUS W/DEVICE KIT: 1.0000 | PACK | 0 refills | Status: DC

## 2020-09-23 MED ORDER — HYDROCODONE-ACETAMINOPHEN 10-325 MG PO TABS: 1.0000 | ORAL_TABLET | Freq: Three times a day (TID) | ORAL | Status: DC | PRN

## 2020-09-23 MED ORDER — SITAGLIPTIN PHOSPHATE 50 MG PO TABS: ORAL_TABLET | ORAL | 3 refills | Status: DC

## 2020-09-23 MED ORDER — GABAPENTIN 300 MG PO CAPS: ORAL_CAPSULE | ORAL | 3 refills | Status: DC

## 2020-09-23 MED ORDER — CYCLOBENZAPRINE HCL 10 MG PO TABS: ORAL_TABLET | ORAL | 2 refills | Status: DC

## 2020-09-23 MED ORDER — TRAZODONE HCL 50 MG PO TABS: ORAL_TABLET | ORAL | 3 refills | Status: DC

## 2020-09-23 NOTE — Patient Instructions (Signed)
Pruritus Pruritus is an itchy feeling on the skin. One of the most common causes is dry skin, but many different things can cause itching. Most cases of itching do not require medical attention. Sometimes itchy skin can turn into a rash. Follow these instructions at home: Skin care   Apply moisturizing lotion to your skin as needed. Lotion that contains petroleum jelly is best.  Take medicines or apply medicated creams only as told by your health care provider. This may include: ? Corticosteroid cream. ? Anti-itch lotions. ? Oral antihistamines.  Apply a cool, wet cloth (cool compress) to the affected areas.  Take baths with one of the following: ? Epsom salts. You can get these at your local pharmacy or grocery store. Follow the instructions on the packaging. ? Baking soda. Pour a small amount into the bath as told by your health care provider. ? Colloidal oatmeal. You can get this at your local pharmacy or grocery store. Follow the instructions on the packaging.  Apply baking soda paste to your skin. To make the paste, stir water into a small amount of baking soda until it reaches a paste-like consistency.  Do not scratch your skin.  Do not take hot showers or baths, which can make itching worse. A cool shower may help with itching as long as you apply moisturizing lotion after the shower.  Do not use scented soaps, detergents, perfumes, and cosmetic products. Instead, use gentle, unscented versions of these items. General instructions  Avoid wearing tight clothes.  Keep a journal to help find out what is causing your itching. Write down: ? What you eat and drink. ? What cosmetic products you use. ? What soaps or detergents you use. ? What you wear, including jewelry.  Use a humidifier. This keeps the air moist, which helps to prevent dry skin.  Be aware of any changes in your itchiness. Contact a health care provider if:  The itching does not go away after several  days.  You are unusually thirsty or urinating more than normal.  Your skin tingles or feels numb.  Your skin or the white parts of your eyes turn yellow (jaundice).  You feel weak.  You have any of the following: ? Night sweats. ? Tiredness (fatigue). ? Weight loss. ? Abdominal pain. Summary  Pruritus is an itchy feeling on the skin. One of the most common causes is dry skin, but many different conditions and factors can cause itching.  Apply moisturizing lotion to your skin as needed. Lotion that contains petroleum jelly is best.  Take medicines or apply medicated creams only as told by your health care provider.  Do not take hot showers or baths. Do not use scented soaps, detergents, perfumes, or cosmetic products. This information is not intended to replace advice given to you by your health care provider. Make sure you discuss any questions you have with your health care provider. Document Revised: 11/19/2017 Document Reviewed: 11/19/2017 Elsevier Patient Education  2020 [employer]  

## 2020-09-23 NOTE — Telephone Encounter (Signed)
Please see patient request for medication refill

## 2020-09-23 NOTE — Progress Notes (Signed)
[address], [place]  [address] Phone:  [phone]   Fax:  [phone]   Established Patient Office Visit  Subjective:  Patient ID: Beth Hubbard, female    DOB: 1946/10/30  Age: 74 y.o. MRN: [id number]  CC:  Chief Complaint  Patient presents with  . Follow-up    Pt states her whole body is itching. X1wk.    HPI Beth Hubbard presents for follow up. She  has a past medical history of Arthritis, Diabetes mellitus without complication ([place]), Excessive daytime sleepiness, Glaucoma, High cholesterol, Hypertension, Mitral regurgitation, OSA (obstructive sleep apnea) (10/18/2015), Pneumonia, Shingles, and TIA (transient ischemic attack).   Itching Symptoms began a few days ago. It is located on her entire body. In the area of itching, she has had no rashes or lesions. Overall, symptoms have been moderate and are exacerbated by nothing.  Treatment has been tried with over-counter medications and Vaseline which has produced fair improvement.  Hypertension Patient is here for follow-up of elevated blood pressure. She is not exercising and is adherent to a low-salt diet.  She admits that she has some changes in her appetite.  She desires food but once is prepared she no longer has a desire to eat.  Blood pressure is not monitored at home. Cardiac symptoms: none. Patient denies chest pain, dyspnea, irregular heart beat, lower extremity edema, orthopnea and syncope.Cardiovascular risk factors: advanced age (older than 11 for men, 60 for women), diabetes mellitus, dyslipidemia, hypertension and sedentary lifestyle. Use of agents associated with hypertension: none. History of target organ damage: transient ischemic attack and Mitral regurgitation.   Past Medical History:  Diagnosis Date  . Arthritis   . Diabetes mellitus without complication ([place])   . Excessive daytime sleepiness   . Glaucoma   . High cholesterol   . Hypertension   . Mitral  regurgitation    mild by echo 03/2017  . OSA (obstructive sleep apnea) 10/18/2015   Mild OSA with AHI 12.5/hr on CPAP at 8cm H2O  . Pneumonia   . Shingles   . TIA (transient ischemic attack)     Past Surgical History:  Procedure Laterality Date  . ABDOMINAL SURGERY    . EYE SURGERY Bilateral    cataract surgery  . ROTATOR CUFF REPAIR     bilateral  . TEE WITHOUT CARDIOVERSION N/A 07/20/2016   Procedure: TRANSESOPHAGEAL ECHOCARDIOGRAM (TEE);  Surgeon: Pixie Casino, MD;  Location: [facility];  Service: Cardiovascular;  Laterality: N/A;  . TOOTH EXTRACTION Left 10/09/2019   Procedure: DENTAL EXTRACTION X1 and Irrigation and Debridement.;  Surgeon: Diona Browner, DDS;  Location: [facility];  Service: Oral Surgery;  Laterality: Left;  DENTAL EXTRACTION X1 and Irrigation and Debridement.    Family History  Problem Relation Age of Onset  . Cancer Father   . Cancer Sister   . Cancer Brother   . Breast cancer Neg Hx     Social History   Socioeconomic History  . Marital status: Legally Separated    Spouse name: Not on file  . Number of children: Not on file  . Years of education: Not on file  . Highest education level: Not on file  Occupational History  . Not on file  Tobacco Use  . Smoking status: Never Smoker  . Smokeless tobacco: Never Used  Vaping Use  . Vaping Use: Never used  Substance and Sexual Activity  . Alcohol use: No  . Drug use: No  .  Sexual activity: Not on file  Other Topics Concern  . Not on file  Social History Narrative  . Not on file   Social Determinants of Health   Financial Resource Strain:   . Difficulty of Paying Living Expenses: Not on file  Food Insecurity:   . Worried About Charity fundraiser in the Last Year: Not on file  . Ran Out of Food in the Last Year: Not on file  Transportation Needs:   . Lack of Transportation (Medical): Not on file  . Lack of Transportation (Non-Medical): Not on file  Physical Activity:   . Days of Exercise  per Week: Not on file  . Minutes of Exercise per Session: Not on file  Stress:   . Feeling of Stress : Not on file  Social Connections:   . Frequency of Communication with Friends and Family: Not on file  . Frequency of Social Gatherings with Friends and Family: Not on file  . Attends Religious Services: Not on file  . Active Member of Clubs or Organizations: Not on file  . Attends Archivist Meetings: Not on file  . Marital Status: Not on file  Intimate Partner Violence:   . Fear of Current or Ex-Partner: Not on file  . Emotionally Abused: Not on file  . Physically Abused: Not on file  . Sexually Abused: Not on file    Outpatient Medications Prior to Visit  Medication Sig Dispense Refill  . amLODipine (NORVASC) 5 MG tablet TAKE 1 TABLET(5 MG) BY MOUTH DAILY 90 tablet 3  . COMBIGAN 0.2-0.5 % ophthalmic solution Place 1 drop into both eyes 2 (two) times daily.     . cycloSPORINE (RESTASIS) 0.05 % ophthalmic emulsion Place 1 drop into both eyes 2 (two) times daily.     . dorzolamide (TRUSOPT) 2 % ophthalmic solution Place 1 drop into both eyes 2 (two) times daily.     . ferrous sulfate 325 (65 FE) MG tablet Take 325 mg by mouth daily.     Marland Kitchen ROCKLATAN 0.02-0.005 % SOLN Place 1 drop into both eyes at bedtime.    . rosuvastatin (CRESTOR) 20 MG tablet TAKE 1 TABLET(20 MG) BY MOUTH DAILY 90 tablet 3  . ACCU-CHEK AVIVA PLUS test strip Reported on 02/24/2016 100 each 1  . ACCU-CHEK SOFTCLIX LANCETS lancets Check BS once or twice a day. 100 each 1  . Blood Glucose Monitoring Suppl (ACCU-CHEK AVIVA PLUS) w/Device KIT 1 kit by Does not apply route as directed. Check 1 to 2 times daily 1 kit 0  . cyclobenzaprine (FLEXERIL) 10 MG tablet TAKE 1 TABLET(10 MG) BY MOUTH THREE TIMES DAILY AS NEEDED FOR MUSCLE SPASMS (Patient taking differently: Take 10 mg by mouth 3 (three) times daily. ) 30 tablet 0  . gabapentin (NEURONTIN) 300 MG capsule TAKE 1 CAPSULE BY MOUTH EVERY NIGHT AT BEDTIME 90  capsule 1  . HYDROcodone-acetaminophen (NORCO) 10-325 MG tablet Take 1 tablet by mouth every 8 (eight) hours as needed.    . sitaGLIPtin (JANUVIA) 50 MG tablet TAKE 1 TABLET(50 MG) BY MOUTH DAILY 90 tablet 3  . traZODone (DESYREL) 50 MG tablet TAKE 1 TABLET BY MOUTH AT BEDTIME AS NEEDED FOR SLEEP 90 tablet 1   No facility-administered medications prior to visit.    Allergies  Allergen Reactions  . Aspirin     Tremor   . Penicillins Itching and Other (See Comments)    Reaction: itching,headache, dizziness and anxiety. Feels like she is "goin off" Did  it involve swelling of the face/tongue/throat, SOB, or low BP? No Did it involve sudden or severe rash/hives, skin peeling, or any reaction on the inside of your mouth or nose? No Did you need to seek medical attention at a hospital or doctor's office? No When did it last happen?Childhood If all above answers are "NO", may proceed with cephalosporin use.  . Nsaids Anxiety    Reaction: causes dizziness and headache. Ibuprofen. Aspirin    ROS Review of Systems  Constitutional: Positive for appetite change and unexpected weight change.  HENT: Negative.   Eyes: Negative.   Respiratory: Negative for shortness of breath.        Sleep apnea uses CPAP  Cardiovascular: Negative for chest pain.  Gastrointestinal: Negative for constipation, diarrhea and nausea.  Endocrine: Negative.   Genitourinary:       Hesitancy    Musculoskeletal: Negative.   Skin: Negative.   Allergic/Immunologic: Negative.   Neurological: Negative for dizziness, light-headedness and headaches.  Hematological: Negative.       Objective:    Physical Exam Constitutional:      General: She is not in acute distress.    Appearance: She is not ill-appearing, toxic-appearing or diaphoretic.  HENT:     Head: Normocephalic and atraumatic.     Nose: Nose normal.     Mouth/Throat:     Mouth: Mucous membranes are moist.  Cardiovascular:     Rate and Rhythm:  Normal rate and regular rhythm.     Pulses: Normal pulses.     Heart sounds: Normal heart sounds.  Pulmonary:     Effort: Pulmonary effort is normal.     Breath sounds: Normal breath sounds.  Abdominal:     General: Bowel sounds are normal.     Palpations: Abdomen is soft.  Musculoskeletal:        General: Normal range of motion.     Cervical back: Normal range of motion.     Comments: Back pain difficulty lying back for exam  Skin:    General: Skin is warm and dry.     Capillary Refill: Capillary refill takes less than 2 seconds.     Findings: No rash.     Comments: Numerous moles  Neurological:     General: No focal deficit present.     Mental Status: She is alert and oriented to person, place, and time.  Psychiatric:        Mood and Affect: Mood normal.        Behavior: Behavior normal.        Thought Content: Thought content normal.        Judgment: Judgment normal.     BP 136/69 (BP Location: Left Arm, Patient Position: Sitting, Cuff Size: Normal)   Pulse 83   Temp (!) 97.5 F (36.4 C)   Resp 17   Ht 5' 4" (1.626 m)   Wt 164 lb 12.8 oz (74.8 kg)   SpO2 99%   BMI 28.29 kg/m  Wt Readings from Last 3 Encounters:  09/23/20 164 lb 12.8 oz (74.8 kg)  06/24/20 166 lb (75.3 kg)  04/13/20 162 lb (73.5 kg)     There are no preventive care reminders to display for this patient.  There are no preventive care reminders to display for this patient.  Lab Results  Component Value Date   TSH 1.27 03/05/2016   Lab Results  Component Value Date   WBC 12.6 (H) 02/27/2020   HGB 13.1 02/27/2020   HCT  40.5 02/27/2020   MCV 96.4 02/27/2020   PLT 253 02/27/2020   Lab Results  Component Value Date   NA 143 06/24/2020   K 4.0 06/24/2020   CO2 29 02/27/2020   GLUCOSE 105 (H) 06/24/2020   BUN 11 06/24/2020   CREATININE 0.87 06/24/2020   BILITOT <0.2 06/24/2020   ALKPHOS 60 06/24/2020   AST 20 06/24/2020   ALT 14 07/03/2019   PROT 7.1 06/24/2020   ALBUMIN 4.4  06/24/2020   CALCIUM 9.3 06/24/2020   ANIONGAP 13 02/27/2020   Lab Results  Component Value Date   CHOL 173 06/24/2020   Lab Results  Component Value Date   HDL 54 06/24/2020   Lab Results  Component Value Date   LDLCALC 86 06/24/2020   Lab Results  Component Value Date   TRIG 197 (H) 06/24/2020   Lab Results  Component Value Date   CHOLHDL 3.2 06/24/2020   Lab Results  Component Value Date   HGBA1C 6.1 (A) 09/23/2020   HGBA1C 6.1 09/23/2020   HGBA1C 6.1 09/23/2020   HGBA1C 6.1 09/23/2020      Assessment & Plan:   Problem List Items Addressed This Visit      Endocrine   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[place]) Encourage compliance with current treatment regimen current A1c 6.1 Encourage regular CBG monitoring Encourage contacting office if excessive hyperglycemia and or hypoglycemia Lifestyle modification with healthy diet (fewer calories, more high fiber foods, whole grains and non-starchy vegetables, lower fat meat and fish, low-fat diary include healthy oils) regular exercise (physical activity) Home BP monitoring also encouraged goal <130/80     Relevant Medications   sitaGLIPtin (JANUVIA) 50 MG tablet   Other Relevant Orders   POC Glucose (CBG)   POC HgB A1c (Completed)  Other Visit Diagnoses    Cervicalgia     Continue to follow-up with pain management   Relevant Medications   gabapentin (NEURONTIN) 300 MG capsule   Insomnia, unspecified type     Continue with current trazodone regimen since this is effective   Relevant Medications   traZODone (DESYREL) 50 MG tablet   Iron deficiency anemia, unspecified iron deficiency anemia type       Relevant Orders   CBC with Differential/Platelet   Iron, TIBC and Ferritin Panel   Hyperlipidemia, unspecified hyperlipidemia type        Relevant Orders   Lipid panel   Flu vaccine need       Relevant Orders   Flu Vaccine QUAD High Dose(Fluad)      Meds ordered this encounter    Medications  . gabapentin (NEURONTIN) 300 MG capsule    Sig: TAKE 1 CAPSULE BY MOUTH EVERY NIGHT AT BEDTIME    Dispense:  90 capsule    Refill:  3    Order Specific Question:   Supervising Provider    Answer:   Tresa Garter [id number]  . sitaGLIPtin (JANUVIA) 50 MG tablet    Sig: TAKE 1 TABLET(50 MG) BY MOUTH DAILY    Dispense:  90 tablet    Refill:  3    Order Specific Question:   Supervising Provider    Answer:   Tresa Garter [id number]  . HYDROcodone-acetaminophen (NORCO) 10-325 MG tablet    Sig: Take 1 tablet by mouth every 8 (eight) hours as needed.    Dispense:  30 tablet    Order Specific Question:   Supervising Provider    Answer:   Tresa Garter [id number]  .  traZODone (DESYREL) 50 MG tablet    Sig: TAKE 1 TABLET BY MOUTH AT BEDTIME AS NEEDED FOR SLEEP    Dispense:  90 tablet    Refill:  3    Order Specific Question:   Supervising Provider    Answer:   Tresa Garter [id number]  . cyclobenzaprine (FLEXERIL) 10 MG tablet    Sig: TAKE 1 TABLET(10 MG) BY MOUTH THREE TIMES DAILY AS NEEDED FOR MUSCLE SPASMS    Dispense:  30 tablet    Refill:  2    Order Specific Question:   Supervising Provider    Answer:   Tresa Garter [id number]  . ACCU-CHEK AVIVA PLUS test strip    Sig: Reported on 02/24/2016    Dispense:  100 each    Refill:  1    Order Specific Question:   Supervising Provider    Answer:   Tresa Garter [id number]  . Accu-Chek Softclix Lancets lancets    Sig: Check BS once or twice a day.    Dispense:  100 each    Refill:  1    Order Specific Question:   Supervising Provider    Answer:   Tresa Garter [id number]  . Blood Glucose Monitoring Suppl (ACCU-CHEK AVIVA PLUS) w/Device KIT    Sig: 1 kit by Does not apply route as directed. Check 1 to 2 times daily    Dispense:  1 kit    Refill:  0    DX E11.9    Order Specific Question:   Supervising Provider    Answer:   Tresa Garter [[id number]]    Follow-up: Return  in about 6 months (around 03/23/2021).    Vevelyn Francois, NP

## 2020-09-30 ENCOUNTER — Other Ambulatory Visit: Payer: Medicare Other

## 2020-09-30 ENCOUNTER — Other Ambulatory Visit: Payer: Self-pay

## 2020-09-30 DIAGNOSIS — E1129 Type 2 diabetes mellitus with other diabetic kidney complication: Secondary | ICD-10-CM | POA: Diagnosis not present

## 2020-09-30 DIAGNOSIS — D509 Iron deficiency anemia, unspecified: Secondary | ICD-10-CM | POA: Diagnosis not present

## 2020-09-30 DIAGNOSIS — E785 Hyperlipidemia, unspecified: Secondary | ICD-10-CM | POA: Diagnosis not present

## 2020-09-30 DIAGNOSIS — I10 Essential (primary) hypertension: Secondary | ICD-10-CM | POA: Diagnosis not present

## 2020-09-30 DIAGNOSIS — R809 Proteinuria, unspecified: Secondary | ICD-10-CM | POA: Diagnosis not present

## 2020-10-01 LAB — LIPID PANEL
Chol/HDL Ratio: 3.4 ratio (ref 0.0–4.4)
Cholesterol, Total: 168 mg/dL (ref 100–199)
HDL: 49 mg/dL (ref >39)
LDL Chol Calc (NIH): 91 mg/dL (ref 0–99)
Triglycerides: 164 mg/dL — ABNORMAL HIGH (ref 0–149)
VLDL Cholesterol Cal: 28 mg/dL (ref 5–40)

## 2020-10-01 LAB — COMP. METABOLIC PANEL (12)
AST: 20 IU/L (ref 0–40)
Albumin/Globulin Ratio: 1.6 (ref 1.2–2.2)
Albumin: 4.3 g/dL (ref 3.7–4.7)
Alkaline Phosphatase: 62 IU/L (ref 44–121)
BUN/Creatinine Ratio: 15 (ref 12–28)
BUN: 13 mg/dL (ref 8–27)
Bilirubin Total: 0.2 mg/dL (ref 0.0–1.2)
Calcium: 9.4 mg/dL (ref 8.7–10.3)
Chloride: 99 mmol/L (ref 96–106)
Creatinine, Ser: 0.84 mg/dL (ref 0.57–1.00)
GFR calc Af Amer: 79 mL/min/{1.73_m2} (ref >59)
GFR calc non Af Amer: 68 mL/min/{1.73_m2} (ref >59)
Globulin, Total: 2.7 g/dL (ref 1.5–4.5)
Glucose: 155 mg/dL — ABNORMAL HIGH (ref 65–99)
Potassium: 3.7 mmol/L (ref 3.5–5.2)
Sodium: 142 mmol/L (ref 134–144)
Total Protein: 7 g/dL (ref 6.0–8.5)

## 2020-10-01 LAB — IRON,TIBC AND FERRITIN PANEL
Ferritin: 105 ng/mL (ref 15–150)
Iron Saturation: 25 % (ref 15–55)
Iron: 67 ug/dL (ref 27–139)
Total Iron Binding Capacity: 266 ug/dL (ref 250–450)
UIBC: 199 ug/dL (ref 118–369)

## 2020-10-01 LAB — CBC WITH DIFFERENTIAL/PLATELET
Basophils Absolute: 0 10*3/uL (ref 0.0–0.2)
Basos: 0 %
EOS (ABSOLUTE): 0.1 10*3/uL (ref 0.0–0.4)
Eos: 2 %
Hematocrit: 33.7 % — ABNORMAL LOW (ref 34.0–46.6)
Hemoglobin: 11.1 g/dL (ref 11.1–15.9)
Immature Grans (Abs): 0 10*3/uL (ref 0.0–0.1)
Immature Granulocytes: 0 %
Lymphocytes Absolute: 2.3 10*3/uL (ref 0.7–3.1)
Lymphs: 37 %
MCH: 31.3 pg (ref 26.6–33.0)
MCHC: 32.9 g/dL (ref 31.5–35.7)
MCV: 95 fL (ref 79–97)
Monocytes Absolute: 0.4 10*3/uL (ref 0.1–0.9)
Monocytes: 6 %
Neutrophils Absolute: 3.3 10*3/uL (ref 1.4–7.0)
Neutrophils: 55 %
Platelets: 228 10*3/uL (ref 150–450)
RBC: 3.55 x10E6/uL — ABNORMAL LOW (ref 3.77–5.28)
RDW: 13.1 % (ref 11.7–15.4)
WBC: 6.1 10*3/uL (ref 3.4–10.8)

## 2020-10-28 DIAGNOSIS — M25561 Pain in right knee: Secondary | ICD-10-CM | POA: Diagnosis not present

## 2020-10-28 DIAGNOSIS — M4726 Other spondylosis with radiculopathy, lumbar region: Secondary | ICD-10-CM | POA: Diagnosis not present

## 2020-10-28 DIAGNOSIS — M4802 Spinal stenosis, cervical region: Secondary | ICD-10-CM | POA: Diagnosis not present

## 2020-11-16 ENCOUNTER — Other Ambulatory Visit: Payer: Self-pay | Admitting: Nurse Practitioner

## 2020-11-16 DIAGNOSIS — G4700 Insomnia, unspecified: Secondary | ICD-10-CM

## 2021-01-06 ENCOUNTER — Other Ambulatory Visit: Payer: Self-pay

## 2021-01-06 ENCOUNTER — Telehealth: Payer: Self-pay

## 2021-01-06 ENCOUNTER — Ambulatory Visit (INDEPENDENT_AMBULATORY_CARE_PROVIDER_SITE_OTHER): Payer: Medicare Other | Admitting: Nurse Practitioner

## 2021-01-06 ENCOUNTER — Encounter: Payer: Self-pay | Admitting: Nurse Practitioner

## 2021-01-06 VITALS — BP 133/69 | HR 82 | Temp 97.9°F | Ht 64.0 in | Wt 167.0 lb

## 2021-01-06 DIAGNOSIS — M79602 Pain in left arm: Secondary | ICD-10-CM

## 2021-01-06 DIAGNOSIS — H918X1 Other specified hearing loss, right ear: Secondary | ICD-10-CM

## 2021-01-06 DIAGNOSIS — S0990XA Unspecified injury of head, initial encounter: Secondary | ICD-10-CM | POA: Diagnosis not present

## 2021-01-06 NOTE — Progress Notes (Signed)
Beth Hubbard, [place]  [address] Phone:  [phone]   Fax:  [phone]   Established Patient Office Visit  Subjective:  Patient ID: Beth Hubbard, female    DOB: 09/08/1946  Age: 75 y.o. MRN: [id number]  CC:  Chief Complaint  Patient presents with  . Hearing Problem    Right side, for about 2 weeks now    HPI Beth Hubbard presents for right ear hearing changes. She  has a past medical history of Arthritis, Diabetes mellitus without complication ([place]), Excessive daytime sleepiness, Glaucoma, High cholesterol, Hypertension, Mitral regurgitation, OSA (obstructive sleep apnea) (10/18/2015), Pneumonia, Shingles, and TIA (transient ischemic attack).    Beth Hubbard is a 75 y.o. female who presents for evaluation of a hearing loss relatd to possible concussion. Initial evaluation is this visit. Injury occurred 3 weeks ago to heavy frying pain falling on top of her head while she was trying to place it up over the cabinet.. Mechanism of injury was head to pan contact. The point of impact was the top of head. Patient did not experience an altered level of consciousness. Patient did not have retrograde and anterograde amnesia. Since the injury, her symptoms include headache and ringing in ears. She has had no previous head injuries.  Hearing Loss Patient presents with a right ear mass. The patient reports that the mass has been present for 3 weeks.  Associated symptoms were positive for head pain and neck tenderness due injury  There has not been a history of enlargement with upper respiratory tract infections, bleeding, difficulty swallowing, fatigue, weight loss, fevers.   Past Medical History:  Diagnosis Date  . Arthritis   . Diabetes mellitus without complication ([place])   . Excessive daytime sleepiness   . Glaucoma   . High cholesterol   . Hypertension   . Mitral regurgitation    mild by echo 03/2017  . OSA (obstructive sleep apnea)  10/18/2015   Mild OSA with AHI 12.5/hr on CPAP at 8cm H2O  . Pneumonia   . Shingles   . TIA (transient ischemic attack)     Past Surgical History:  Procedure Laterality Date  . ABDOMINAL SURGERY    . EYE SURGERY Bilateral    cataract surgery  . ROTATOR CUFF REPAIR     bilateral  . TEE WITHOUT CARDIOVERSION N/A 07/20/2016   Procedure: TRANSESOPHAGEAL ECHOCARDIOGRAM (TEE);  Surgeon: Pixie Casino, MD;  Location: [facility];  Service: Cardiovascular;  Laterality: N/A;  . TOOTH EXTRACTION Left 10/09/2019   Procedure: DENTAL EXTRACTION X1 and Irrigation and Debridement.;  Surgeon: Diona Browner, DDS;  Location: [facility];  Service: Oral Surgery;  Laterality: Left;  DENTAL EXTRACTION X1 and Irrigation and Debridement.    Family History  Problem Relation Age of Onset  . Cancer Father   . Cancer Sister   . Cancer Brother   . Breast cancer Neg Hx     Social History   Socioeconomic History  . Marital status: Legally Separated    Spouse name: Not on file  . Number of children: Not on file  . Years of education: Not on file  . Highest education level: Not on file  Occupational History  . Not on file  Tobacco Use  . Smoking status: Never Smoker  . Smokeless tobacco: Never Used  Vaping Use  . Vaping Use: Never used  Substance and Sexual Activity  . Alcohol use: No  . Drug use: No  .  Sexual activity: Not on file  Other Topics Concern  . Not on file  Social History Narrative  . Not on file   Social Determinants of Health   Financial Resource Strain: Not on file  Food Insecurity: Not on file  Transportation Needs: Not on file  Physical Activity: Not on file  Stress: Not on file  Social Connections: Not on file  Intimate Partner Violence: Not on file    Outpatient Medications Prior to Visit  Medication Sig Dispense Refill  . ACCU-CHEK AVIVA PLUS test strip Reported on 02/24/2016 100 each 1  . Accu-Chek Softclix Lancets lancets Check BS once or twice a day. 100 each 1  .  amLODipine (NORVASC) 5 MG tablet TAKE 1 TABLET(5 MG) BY MOUTH DAILY 90 tablet 3  . Blood Glucose Monitoring Suppl (ACCU-CHEK AVIVA PLUS) w/Device KIT 1 kit by Does not apply route as directed. Check 1 to 2 times daily 1 kit 0  . COMBIGAN 0.2-0.5 % ophthalmic solution Place 1 drop into both eyes 2 (two) times daily.     . cyclobenzaprine (FLEXERIL) 10 MG tablet TAKE 1 TABLET(10 MG) BY MOUTH THREE TIMES DAILY AS NEEDED FOR MUSCLE SPASMS 30 tablet 2  . cycloSPORINE (RESTASIS) 0.05 % ophthalmic emulsion Place 1 drop into both eyes 2 (two) times daily.     . dorzolamide (TRUSOPT) 2 % ophthalmic solution Place 1 drop into both eyes 2 (two) times daily.     . ferrous sulfate 325 (65 FE) MG tablet Take 325 mg by mouth daily.     Marland Kitchen gabapentin (NEURONTIN) 300 MG capsule TAKE 1 CAPSULE BY MOUTH EVERY NIGHT AT BEDTIME 90 capsule 3  . HYDROcodone-acetaminophen (NORCO) 10-325 MG tablet Take 1 tablet by mouth every 8 (eight) hours as needed. 30 tablet   . ROCKLATAN 0.02-0.005 % SOLN Place 1 drop into both eyes at bedtime.    . rosuvastatin (CRESTOR) 20 MG tablet TAKE 1 TABLET(20 MG) BY MOUTH DAILY 90 tablet 3  . sitaGLIPtin (JANUVIA) 50 MG tablet TAKE 1 TABLET(50 MG) BY MOUTH DAILY 90 tablet 3  . traZODone (DESYREL) 50 MG tablet TAKE 1 TABLET BY MOUTH AT BEDTIME AS NEEDED FOR SLEEP 90 tablet 3   No facility-administered medications prior to visit.    Allergies  Allergen Reactions  . Aspirin     Tremor   . Penicillins Itching and Other (See Comments)    Reaction: itching,headache, dizziness and anxiety. Feels like she is "goin off" Did it involve swelling of the face/tongue/throat, SOB, or low BP? No Did it involve sudden or severe rash/hives, skin peeling, or any reaction on the inside of your mouth or nose? No Did you need to seek medical attention at a hospital or doctor's office? No When did it last happen?Childhood If all above answers are "NO", may proceed with cephalosporin use.  . Nsaids  Anxiety    Reaction: causes dizziness and headache. Ibuprofen. Aspirin    ROS Review of Systems  Respiratory: Negative for shortness of breath.   Cardiovascular: Negative for chest pain.  Musculoskeletal:       2004 suffered a fall and hit her left arm  Neurological: Negative for dizziness, light-headedness and headaches.      Objective:    Physical Exam Constitutional:      Appearance: She is not ill-appearing, toxic-appearing or diaphoretic.  HENT:     Head: Normocephalic.     Comments: Tenderness to top of head grimacing with palpation no palpable mass  Right Ear: Tympanic membrane normal.     Left Ear: Tympanic membrane normal.     Nose: Nose normal.     Mouth/Throat:     Mouth: Mucous membranes are moist.  Eyes:     Extraocular Movements: Extraocular movements intact.     Pupils: Pupils are equal, round, and reactive to light.  Neck:     Comments: adequate Cardiovascular:     Rate and Rhythm: Normal rate and regular rhythm.     Pulses: Normal pulses.     Heart sounds: Normal heart sounds.  Pulmonary:     Effort: Pulmonary effort is normal.     Breath sounds: Normal breath sounds.  Musculoskeletal:     Cervical back: Tenderness present. No rigidity.     Comments: Weakness right upper extremity with grasping  Lymphadenopathy:     Cervical: No cervical adenopathy.  Skin:    General: Skin is warm and dry.     Capillary Refill: Capillary refill takes less than 2 seconds.  Neurological:     General: No focal deficit present.     Mental Status: She is alert and oriented to person, place, and time.     Cranial Nerves: No cranial nerve deficit.  Psychiatric:        Mood and Affect: Mood normal.        Behavior: Behavior normal.        Thought Content: Thought content normal.        Judgment: Judgment normal.     BP 133/69   Pulse 82   Temp 97.9 F (36.6 C) (Temporal)   Ht 5' 4"  (1.626 m)   Wt 167 lb (75.8 kg)   SpO2 96%   BMI 28.67 kg/m  Wt Readings  from Last 3 Encounters:  01/06/21 167 lb (75.8 kg)  09/23/20 164 lb 12.8 oz (74.8 kg)  06/24/20 166 lb (75.3 kg)     Health Maintenance Due  Topic Date Due  . OPHTHALMOLOGY EXAM  09/04/2018  . COVID-19 Vaccine (3 - Booster for Pfizer series) 11/27/2020    There are no preventive care reminders to display for this patient.  Lab Results  Component Value Date   TSH 1.27 03/05/2016   Lab Results  Component Value Date   WBC 6.1 09/30/2020   HGB 11.1 09/30/2020   HCT 33.7 (L) 09/30/2020   MCV 95 09/30/2020   PLT 228 09/30/2020   Lab Results  Component Value Date   NA 142 09/30/2020   K 3.7 09/30/2020   CO2 29 02/27/2020   GLUCOSE 155 (H) 09/30/2020   BUN 13 09/30/2020   CREATININE 0.84 09/30/2020   BILITOT 0.2 09/30/2020   ALKPHOS 62 09/30/2020   AST 20 09/30/2020   ALT 14 07/03/2019   PROT 7.0 09/30/2020   ALBUMIN 4.3 09/30/2020   CALCIUM 9.4 09/30/2020   ANIONGAP 13 02/27/2020   Lab Results  Component Value Date   CHOL 168 09/30/2020   Lab Results  Component Value Date   HDL 49 09/30/2020   Lab Results  Component Value Date   LDLCALC 91 09/30/2020   Lab Results  Component Value Date   TRIG 164 (H) 09/30/2020   Lab Results  Component Value Date   CHOLHDL 3.4 09/30/2020   Lab Results  Component Value Date   HGBA1C 6.1 (A) 09/23/2020   HGBA1C 6.1 09/23/2020   HGBA1C 6.1 09/23/2020   HGBA1C 6.1 09/23/2020      Assessment & Plan:   Problem List Items  Addressed This Visit   None   Visit Diagnoses    Other specified hearing loss of right ear, unspecified hearing status on contralateral side    -  Primary ENT referrral for evaluation    Injury of head, initial encounter     CTscan to evaluate for head injury due to tenderness and pain      No orders of the defined types were placed in this encounter.   Follow-up: Return for Appointment As Scheduled.    Vevelyn Francois, NP

## 2021-01-06 NOTE — Patient Instructions (Signed)
Head Injury, Adult There are many types of head injuries. They can be as minor as a small bump. Some head injuries can be worse. Worse injuries include:  A strong hit to the head that shakes the brain back and forth, causing damage (concussion).  A bruise (contusion) of the brain. This means there is bleeding in the brain that can cause swelling.  A cracked skull (skull fracture).  Bleeding in the brain that gathers, gets thick (makes a clot), and forms a bump (hematoma). Most problems from a head injury come in the first 24 hours. However, you may still have side effects up to 7-10 days after your injury. It is important to watch your condition for any changes. You may need to be watched in the emergency department or urgent care, or you may need to stay in the hospital. What are the causes? There are many possible causes of a head injury. A serious head injury may be caused by:  A car accident.  Bicycle or motorcycle accidents.  Sports injuries.  Falls.  Being hit by an object. What are the signs or symptoms? Symptoms of a head injury include a bruise, bump, or bleeding where the injury happened. Other physical symptoms may include:  Headache.  Feeling like you may vomit (nauseous) or vomiting.  Dizziness.  Blurred or double vision.  Being uncomfortable around bright lights or loud noises.  Shaking movements that you cannot control (seizures).  Feeling tired.  Trouble being woken up.  Fainting or loss of consciousness. Mental or emotional symptoms may include:  Feeling grumpy or cranky.  Confusion and memory problems.  Having trouble paying attention or concentrating.  Changes in eating or sleeping habits.  Feeling worried or nervous (anxious).  Feeling sad (depressed). How is this treated? Treatment for this condition depends on how severe the injury is and the type of injury you have. The main goal is to prevent problems and to allow the brain time to  heal. Mild head injury If you have a mild head injury, you may be sent home, and treatment may include:  Being watched. A responsible adult should stay with you for 24 hours after your injury and check on you often.  Physical rest.  Brain rest.  Pain medicines. Severe head injury If you have a severe head injury, treatment may include:  Being watched closely. This includes staying in the hospital.  Medicines to: ? Help with pain. ? Prevent seizures. ? Help with brain swelling.  Protecting your airway and using a machine that helps you breathe (ventilator).  Treatments to watch for and manage swelling inside the brain.  Brain surgery. This may be needed to: ? Remove a collection of blood or blood clots. ? Stop the bleeding. ? Remove a part of the skull. This allows room for the brain to swell. Follow these instructions at home: Activity  Rest.  Avoid activities that are hard or tiring.  Make sure you get enough sleep.  Let your brain rest. Do this by limiting activities that need a lot of thought or attention, such as: ? Watching TV. ? Playing memory games and puzzles. ? Job-related work or homework. ? Working on the computer, social media, and texting.  Avoid activities that could cause another head injury until your doctor says it is okay. This includes playing sports. Having another head injury, especially before the first one has healed, can be dangerous.  Ask your doctor when it is safe for you to go back to   your normal activities, such as work or school. Ask your doctor for a step-by-step plan for slowly going back to your normal activities.  Ask your doctor when you can drive, ride a bicycle, or use heavy machinery. Do not do these activities if you are dizzy. Lifestyle  Do not drink alcohol until your doctor says it is okay.  Do not use drugs.  If it is harder than usual to remember things, write them down.  If you are easily distracted, try to do one  thing at a time.  Talk with family members or close friends when making important decisions.  Tell your friends, family, a trusted co-worker, and work manager about your injury, symptoms, and limits (restrictions). Have them watch for any problems that are new or getting worse.   General instructions  Take over-the-counter and prescription medicines only as told by your doctor.  Have someone stay with you for 24 hours after your head injury. This person should watch you for any changes in your symptoms and be ready to get help.  Keep all follow-up visits as told by your doctor. This is important. How is this prevented?  Work on your balance and strength. This can help you avoid falls.  Wear a seat belt when you are in a moving vehicle.  Wear a helmet when you: ? Ride a bicycle. ? Ski. ? Do any other sport or activity that has a risk of injury.  If you drink alcohol: ? Limit how much you use to:  0-1 drink a day for nonpregnant women.  0-2 drinks a day for men. ? Be aware of how much alcohol is in your drink. In the [place] one drink equals one 12 oz bottle of beer (355 mL), one 5 oz glass of wine (148 mL), or one 1 oz glass of hard liquor (44 mL).  Make your home safer by: ? Getting rid of clutter from the floors and stairs. This includes things that can make you trip. ? Using grab bars in bathrooms and handrails by stairs. ? Placing non-slip mats on floors and in bathtubs. ? Putting more light in dim areas. Where to find more information  Centers for Disease Control and Prevention: www.cdc.gov Get help right away if:  You have: ? A very bad headache that is not helped by medicine. ? Trouble walking or weakness in your arms and legs. ? Clear or bloody fluid coming from your nose or ears. ? Changes in how you see (vision). ? A seizure. ? More confusion or more grumpy moods.  Your symptoms get worse.  You are sleepier than normal and have trouble staying awake.  You  lose your balance.  The black centers of your eyes (pupils) change in size.  Your speech is slurred.  Your dizziness gets worse.  You vomit. These symptoms may be an emergency. Do not wait to see if the symptoms will go away. Get medical help right away. Call your local emergency services (911 in the [place] Do not drive yourself to the hospital. Summary  Head injuries can be as minor as a small bump. Some head injuries can be worse.  Treatment for this condition depends on how severe the injury is and the type of injury you have.  Have someone stay with you for 24 hours after your head injury.  Ask your doctor when it is safe for you to go back to your normal activities, such as work or school.  To prevent a head injury,   wear a seat belt in a car, wear a helmet when you use a bicycle, limit your alcohol use, and make your home safer. This information is not intended to replace advice given to you by your health care provider. Make sure you discuss any questions you have with your health care provider. Document Revised: 09/18/2019 Document Reviewed: 09/18/2019 Elsevier Patient Education  2021 [place].    Hearing Loss Hearing loss is a partial or total loss of the ability to hear. This can be temporary or permanent, and it can happen in one or both ears. Medical care is necessary to treat hearing loss properly and to prevent the condition from getting worse. Your hearing may partially or completely come back, depending on what caused your hearing loss and how severe it is. In some cases, hearing loss is permanent. What are the causes? Common causes of hearing loss include:  Too much wax in the ear canal.  Infection of the ear canal or middle ear.  Fluid in the middle ear.  Injury to the ear or surrounding area.  An object stuck in the ear.  A history of prolonged exposure to loud sounds, such as music. Less common causes of hearing loss include:  Tumors in the  ear.  Viral or bacterial infections, such as meningitis.  A hole in the eardrum (perforated eardrum).  Problems with the hearing nerve that sends signals between the brain and the ear.  Certain medicines. What are the signs or symptoms? Symptoms of this condition may include:  Difficulty telling the difference between sounds.  Difficulty following a conversation when there is background noise.  Lack of response to sounds in your environment. This may be most noticeable when you do not respond to startling sounds.  Needing to turn up the volume on the television, radio, or other devices.  Ringing in the ears.  Dizziness. How is this diagnosed? This condition is diagnosed based on:  A physical exam.  A hearing test (audiometry). The audiometry test will be performed by a hearing specialist (audiologist). You may also be referred to an ear, nose, and throat (ENT) specialist (otolaryngologist).   How is this treated? Treatment for hearing loss may include:  Ear wax removal.  Medicines to treat or prevent infection (antibiotics).  Medicines to reduce inflammation (corticosteroids).  Hearing aids for hearing loss related to nerve damage. Follow these instructions at home:  If you were prescribed an antibiotic medicine, take it as told by your health care provider. Do not stop taking the antibiotic even if you start to feel better.  Take over-the-counter and prescription medicines only as told by your health care provider.  Avoid loud noises.  Return to your normal activities as told by your health care provider. Ask your health care provider what activities are safe for you.  Keep all follow-up visits as told by your health care provider. This is important. Contact a health care provider if:  You feel dizzy.  You develop new symptoms.  You vomit or feel nauseous.  You have a fever. Get help right away if:  You develop sudden changes in your vision.  You have  severe ear pain.  You have new or increased weakness.  You have a severe headache. Summary  Hearing loss is a decreased ability to hear sounds around you. It can be temporary or permanent.  Treatment will depend on the cause of your hearing loss. It may include ear wax removal, medicines, or a hearing aid.  Your  hearing may partially or completely come back, depending on what caused your hearing loss and how severe it is.  Keep all follow-up visits as told by your health care provider. This is important. This information is not intended to replace advice given to you by your health care provider. Make sure you discuss any questions you have with your health care provider. Document Revised: 08/05/2018 Document Reviewed: 08/05/2018 Elsevier Patient Education  [place].

## 2021-01-06 NOTE — Telephone Encounter (Signed)
Precert for CT head case# [id number] and neck [phone] not required.

## 2021-01-16 ENCOUNTER — Other Ambulatory Visit: Payer: Self-pay | Admitting: Nurse Practitioner

## 2021-01-16 DIAGNOSIS — S0990XA Unspecified injury of head, initial encounter: Secondary | ICD-10-CM

## 2021-01-16 DIAGNOSIS — M542 Cervicalgia: Secondary | ICD-10-CM

## 2021-01-17 ENCOUNTER — Ambulatory Visit (HOSPITAL_COMMUNITY): Payer: Medicare Other

## 2021-01-17 ENCOUNTER — Ambulatory Visit (HOSPITAL_COMMUNITY): Admission: RE | Admit: 2021-01-17 | Payer: Medicare Other | Source: Ambulatory Visit

## 2021-01-20 DIAGNOSIS — M25511 Pain in right shoulder: Secondary | ICD-10-CM | POA: Diagnosis not present

## 2021-01-20 DIAGNOSIS — M25561 Pain in right knee: Secondary | ICD-10-CM | POA: Diagnosis not present

## 2021-01-20 DIAGNOSIS — M4726 Other spondylosis with radiculopathy, lumbar region: Secondary | ICD-10-CM | POA: Diagnosis not present

## 2021-01-20 DIAGNOSIS — M25512 Pain in left shoulder: Secondary | ICD-10-CM | POA: Diagnosis not present

## 2021-01-20 DIAGNOSIS — M47812 Spondylosis without myelopathy or radiculopathy, cervical region: Secondary | ICD-10-CM | POA: Diagnosis not present

## 2021-01-20 DIAGNOSIS — M4802 Spinal stenosis, cervical region: Secondary | ICD-10-CM | POA: Diagnosis not present

## 2021-02-15 ENCOUNTER — Other Ambulatory Visit: Payer: Self-pay | Admitting: Nurse Practitioner

## 2021-02-15 DIAGNOSIS — S0990XA Unspecified injury of head, initial encounter: Secondary | ICD-10-CM

## 2021-02-15 DIAGNOSIS — M542 Cervicalgia: Secondary | ICD-10-CM

## 2021-02-21 DIAGNOSIS — M4802 Spinal stenosis, cervical region: Secondary | ICD-10-CM | POA: Diagnosis not present

## 2021-03-01 ENCOUNTER — Other Ambulatory Visit: Payer: Self-pay | Admitting: Nurse Practitioner

## 2021-03-01 DIAGNOSIS — E785 Hyperlipidemia, unspecified: Secondary | ICD-10-CM

## 2021-03-23 ENCOUNTER — Ambulatory Visit: Payer: Medicare Other | Admitting: Nurse Practitioner

## 2021-03-24 ENCOUNTER — Other Ambulatory Visit: Payer: Self-pay

## 2021-03-24 ENCOUNTER — Ambulatory Visit (INDEPENDENT_AMBULATORY_CARE_PROVIDER_SITE_OTHER): Payer: Medicare Other | Admitting: Nurse Practitioner

## 2021-03-24 ENCOUNTER — Encounter: Payer: Self-pay | Admitting: Nurse Practitioner

## 2021-03-24 VITALS — BP 144/62 | HR 82 | Temp 97.6°F | Ht 64.0 in | Wt 169.6 lb

## 2021-03-24 DIAGNOSIS — M79622 Pain in left upper arm: Secondary | ICD-10-CM

## 2021-03-24 DIAGNOSIS — I10 Essential (primary) hypertension: Secondary | ICD-10-CM

## 2021-03-24 DIAGNOSIS — G4700 Insomnia, unspecified: Secondary | ICD-10-CM | POA: Diagnosis not present

## 2021-03-24 DIAGNOSIS — E1129 Type 2 diabetes mellitus with other diabetic kidney complication: Secondary | ICD-10-CM | POA: Diagnosis not present

## 2021-03-24 DIAGNOSIS — M549 Dorsalgia, unspecified: Secondary | ICD-10-CM

## 2021-03-24 DIAGNOSIS — R809 Proteinuria, unspecified: Secondary | ICD-10-CM | POA: Diagnosis not present

## 2021-03-24 DIAGNOSIS — I272 Pulmonary hypertension, unspecified: Secondary | ICD-10-CM

## 2021-03-24 DIAGNOSIS — W19XXXA Unspecified fall, initial encounter: Secondary | ICD-10-CM

## 2021-03-24 DIAGNOSIS — E785 Hyperlipidemia, unspecified: Secondary | ICD-10-CM

## 2021-03-24 DIAGNOSIS — D509 Iron deficiency anemia, unspecified: Secondary | ICD-10-CM

## 2021-03-24 LAB — POCT GLYCOSYLATED HEMOGLOBIN (HGB A1C)
HbA1c POC (<> result, manual entry): 6.8 % (ref 4.0–5.6)
HbA1c, POC (controlled diabetic range): 6.8 % (ref 0.0–7.0)
HbA1c, POC (prediabetic range): 6.8 % — AB (ref 5.7–6.4)
Hemoglobin A1C: 6.8 % — AB (ref 4.0–5.6)

## 2021-03-24 MED ORDER — TRAZODONE HCL 50 MG PO TABS: ORAL_TABLET | ORAL | 3 refills | Status: DC

## 2021-03-24 MED ORDER — BLOOD PRESSURE MONITOR KIT: 1.0000 | PACK | Freq: Every day | 0 refills | Status: DC

## 2021-03-24 MED ORDER — CYCLOBENZAPRINE HCL 10 MG PO TABS: ORAL_TABLET | ORAL | 2 refills | Status: DC

## 2021-03-24 NOTE — Progress Notes (Signed)
[place] [place], [place]  [address] Phone:  [phone]   Fax:  [phone]   Established Patient Office Visit  Subjective:  Patient ID: Beth Hubbard, female    DOB: June 20, 1946  Age: 75 y.o. MRN: [id number]  CC:  Chief Complaint  Patient presents with  . Follow-up    6 month follow up , pt fell on on Sunday , an d hit left arm and hit back  , she is using ice and heat on arm and back     HPI Beth Hubbard presents for 6 month follow up. She  has a past medical history of Arthritis, Diabetes mellitus without complication ([place]), Excessive daytime sleepiness, Glaucoma, High cholesterol, Hypertension, Mitral regurgitation, OSA (obstructive sleep apnea) (10/18/2015), Pneumonia, Shingles, and TIA (transient ischemic attack).   Patient is here for evaluation after a fall. Patient reportedly was standing with her foot up on the commode when she fell from standing. The accident occurred 5 days ago. It is reported that the patient did not have LOC. At this time she complains of upper extremity injury.  Patient denies headache, visual change, chest pain, nausea, vomiting, numbness, tingling, incontinence, inability to ambulate and lower extremity injury. Symptoms are exacerbated by use of injured area.  The patient has tried rest, ice and cyclobenzaprine and hydrocodone for her symptoms with moderate relief.   She has a history of pulmonary hypertension and OSA. She uses her CPAP nightly . She does not monitor her BP at home. She does try to exercise daily within her home with walking. She does have occasional dizziness. She denies any headaches. She reports dyspnea with talking for long periods. She has to stop and rest.   She is compliant with her medication. .  Past Medical History:  Diagnosis Date  . Arthritis   . Diabetes mellitus without complication ([place])   . Excessive daytime sleepiness   . Glaucoma   . High cholesterol   . Hypertension   .  Mitral regurgitation    mild by echo 03/2017  . OSA (obstructive sleep apnea) 10/18/2015   Mild OSA with AHI 12.5/hr on CPAP at 8cm H2O  . Pneumonia   . Shingles   . TIA (transient ischemic attack)     Past Surgical History:  Procedure Laterality Date  . ABDOMINAL SURGERY    . EYE SURGERY Bilateral    cataract surgery  . ROTATOR CUFF REPAIR     bilateral  . TEE WITHOUT CARDIOVERSION N/A 07/20/2016   Procedure: TRANSESOPHAGEAL ECHOCARDIOGRAM (TEE);  Surgeon: Pixie Casino, MD;  Location: [facility];  Service: Cardiovascular;  Laterality: N/A;  . TOOTH EXTRACTION Left 10/09/2019   Procedure: DENTAL EXTRACTION X1 and Irrigation and Debridement.;  Surgeon: Diona Browner, DDS;  Location: [facility];  Service: Oral Surgery;  Laterality: Left;  DENTAL EXTRACTION X1 and Irrigation and Debridement.    Family History  Problem Relation Age of Onset  . Cancer Father   . Cancer Sister   . Cancer Brother   . Breast cancer Neg Hx     Social History   Socioeconomic History  . Marital status: Legally Separated    Spouse name: Not on file  . Number of children: Not on file  . Years of education: Not on file  . Highest education level: Not on file  Occupational History  . Not on file  Tobacco Use  . Smoking status: Never Smoker  . Smokeless tobacco: Never Used  Vaping Use  . Vaping Use: Never used  Substance and Sexual Activity  . Alcohol use: No  . Drug use: No  . Sexual activity: Not on file  Other Topics Concern  . Not on file  Social History Narrative  . Not on file   Social Determinants of Health   Financial Resource Strain: Not on file  Food Insecurity: Not on file  Transportation Needs: Not on file  Physical Activity: Not on file  Stress: Not on file  Social Connections: Not on file  Intimate Partner Violence: Not on file    Outpatient Medications Prior to Visit  Medication Sig Dispense Refill  . ACCU-CHEK AVIVA PLUS test strip Reported on 02/24/2016 100 each 1  .  Accu-Chek Softclix Lancets lancets Check BS once or twice a day. 100 each 1  . amLODipine (NORVASC) 5 MG tablet TAKE 1 TABLET(5 MG) BY MOUTH DAILY 90 tablet 3  . Blood Glucose Monitoring Suppl (ACCU-CHEK AVIVA PLUS) w/Device KIT 1 kit by Does not apply route as directed. Check 1 to 2 times daily 1 kit 0  . COMBIGAN 0.2-0.5 % ophthalmic solution Place 1 drop into both eyes 2 (two) times daily.     . cycloSPORINE (RESTASIS) 0.05 % ophthalmic emulsion Place 1 drop into both eyes 2 (two) times daily.     . dorzolamide (TRUSOPT) 2 % ophthalmic solution Place 1 drop into both eyes 2 (two) times daily.     . ferrous sulfate 325 (65 FE) MG tablet Take 325 mg by mouth daily.     Marland Kitchen gabapentin (NEURONTIN) 300 MG capsule TAKE 1 CAPSULE BY MOUTH EVERY NIGHT AT BEDTIME 90 capsule 3  . HYDROcodone-acetaminophen (NORCO) 10-325 MG tablet Take 1 tablet by mouth every 8 (eight) hours as needed. 30 tablet   . ROCKLATAN 0.02-0.005 % SOLN Place 1 drop into both eyes at bedtime.    . rosuvastatin (CRESTOR) 20 MG tablet TAKE 1 TABLET(20 MG) BY MOUTH DAILY 90 tablet 3  . sitaGLIPtin (JANUVIA) 50 MG tablet TAKE 1 TABLET(50 MG) BY MOUTH DAILY 90 tablet 3  . cyclobenzaprine (FLEXERIL) 10 MG tablet TAKE 1 TABLET(10 MG) BY MOUTH THREE TIMES DAILY AS NEEDED FOR MUSCLE SPASMS 30 tablet 2  . traZODone (DESYREL) 50 MG tablet TAKE 1 TABLET BY MOUTH AT BEDTIME AS NEEDED FOR SLEEP 90 tablet 3   No facility-administered medications prior to visit.    Allergies  Allergen Reactions  . Aspirin     Tremor   . Penicillins Itching and Other (See Comments)    Reaction: itching,headache, dizziness and anxiety. Feels like she is "goin off" Did it involve swelling of the face/tongue/throat, SOB, or low BP? No Did it involve sudden or severe rash/hives, skin peeling, or any reaction on the inside of your mouth or nose? No Did you need to seek medical attention at a hospital or doctor's office? No When did it last  happen?Childhood If all above answers are "NO", may proceed with cephalosporin use.  . Nsaids Anxiety    Reaction: causes dizziness and headache. Ibuprofen. Aspirin    ROS Review of Systems  Respiratory: Positive for shortness of breath.   Cardiovascular: Negative for chest pain (She did have pain with the fall but it go butter. ).  Genitourinary: Positive for flank pain. Negative for hematuria.       Some hesitancy   Neurological: Positive for dizziness (occasional; she only cooks. She tries to sit ).      Objective:  Physical Exam Constitutional:      Appearance: She is obese.  HENT:     Head: Normocephalic and atraumatic.     Nose: Nose normal.     Mouth/Throat:     Mouth: Mucous membranes are moist.  Eyes:     Comments: Wearing glasses  Cardiovascular:     Rate and Rhythm: Normal rate and regular rhythm.     Pulses: Normal pulses.     Heart sounds: Normal heart sounds.  Pulmonary:     Effort: Pulmonary effort is normal.     Breath sounds: Normal breath sounds.  Abdominal:     Palpations: Abdomen is soft.  Musculoskeletal:        General: Swelling, tenderness and signs of injury present.     Cervical back: Normal range of motion and neck supple.     Right lower leg: No edema.     Left lower leg: No edema.     Comments: Unable to lay flat  Skin:    General: Skin is warm and dry.     Capillary Refill: Capillary refill takes less than 2 seconds.     Findings: Bruising present.  Neurological:     General: No focal deficit present.     Mental Status: She is alert and oriented to person, place, and time.  Psychiatric:        Mood and Affect: Mood normal.        Behavior: Behavior normal.        Thought Content: Thought content normal.        Judgment: Judgment normal.     BP (!) 144/62 (Patient Position: Sitting, Cuff Size: Normal)   Pulse 82   Temp 97.6 F (36.4 C) (Temporal)   Ht _0  (1.626 m)   Wt 169 lb 9.6 oz (76.9 kg)   SpO2 98%   BMI  29.11 kg/m  Wt Readings from Last 3 Encounters:  03/24/21 169 lb 9.6 oz (76.9 kg)  01/06/21 167 lb (75.8 kg)  09/23/20 164 lb 12.8 oz (74.8 kg)     There are no preventive care reminders to display for this patient.  There are no preventive care reminders to display for this patient.  Lab Results  Component Value Date   TSH 1.27 03/05/2016   Lab Results  Component Value Date   WBC 6.1 09/30/2020   HGB 11.1 09/30/2020   HCT 33.7 (L) 09/30/2020   MCV 95 09/30/2020   PLT 228 09/30/2020   Lab Results  Component Value Date   NA 142 09/30/2020   K 3.7 09/30/2020   CO2 29 02/27/2020   GLUCOSE 155 (H) 09/30/2020   BUN 13 09/30/2020   CREATININE 0.84 09/30/2020   BILITOT 0.2 09/30/2020   ALKPHOS 62 09/30/2020   AST 20 09/30/2020   ALT 14 07/03/2019   PROT 7.0 09/30/2020   ALBUMIN 4.3 09/30/2020   CALCIUM 9.4 09/30/2020   ANIONGAP 13 02/27/2020   Lab Results  Component Value Date   CHOL 168 09/30/2020   Lab Results  Component Value Date   HDL 49 09/30/2020   Lab Results  Component Value Date   LDLCALC 91 09/30/2020   Lab Results  Component Value Date   TRIG 164 (H) 09/30/2020   Lab Results  Component Value Date   CHOLHDL 3.4 09/30/2020   Lab Results  Component Value Date   HGBA1C 6.8 (A) 03/24/2021   HGBA1C 6.8 03/24/2021   HGBA1C 6.8 (A) 03/24/2021   HGBA1C 6.8  03/24/2021      Assessment & Plan:   Problem List Items Addressed This Visit      Cardiovascular and Mediastinum   Pulmonary HTN ([place]) Stable we will follow-up with pulmonology as scheduled     Endocrine   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[place]) - Primary Stable current A1c 6.8 slightly above previous 6.3% Encourage compliance with current treatment regimen   Encourage regular CBG monitoring Encourage contacting office if excessive hyperglycemia and or hypoglycemia Lifestyle modification with healthy diet (fewer calories, more high fiber foods, whole grains and  non-starchy vegetables, lower fat meat and fish, low-fat diary include healthy oils) regular exercise (physical activity) and weight loss Opthalmology exam discussed  Nutritional consult recommended Regular dental visits encouraged Home BP monitoring also encouraged goal <130/80     Relevant Orders   HgB A1c (Completed)   POCT Urinalysis Dipstick    Other Visit Diagnoses    Essential hypertension     Stable we will continue with current regimen   Relevant Orders   Comp. Metabolic Panel (12)   Hyperlipidemia, unspecified hyperlipidemia type     Stable at last evaluation we will continue with current regimen reevaluation pending   Relevant Orders   Lipid panel   Fall, initial encounter     Healing Patient declined physical therapy for strengthening and fall prevention Patient education provided   Left upper arm pain     Persistent however improving with home pain regimen Patient declined physical therapy   Upper back pain on left side     Persistent however improving   Relevant Medications   cyclobenzaprine (FLEXERIL) 10 MG tablet   Insomnia, unspecified type       Relevant Medications   traZODone (DESYREL) 50 MG tablet   Iron deficiency anemia, unspecified iron deficiency anemia type     Stable reevaluation pending   Relevant Orders   CBC with Differential/Platelet      Meds ordered this encounter  Medications  . Blood Pressure Monitor KIT    Sig: 1 kit by Does not apply route daily.    Dispense:  1 kit    Refill:  0    Order Specific Question:   Supervising Provider    Answer:   Tresa Garter [id number]  . traZODone (DESYREL) 50 MG tablet    Sig: TAKE 1 TABLET BY MOUTH AT BEDTIME AS NEEDED FOR SLEEP    Dispense:  90 tablet    Refill:  3    Order Specific Question:   Supervising Provider    Answer:   Tresa Garter [id number]  . cyclobenzaprine (FLEXERIL) 10 MG tablet    Sig: TAKE 1 TABLET(10 MG) BY MOUTH THREE TIMES DAILY AS NEEDED FOR MUSCLE SPASMS     Dispense:  30 tablet    Refill:  2    Order Specific Question:   Supervising Provider    Answer:   Tresa Garter [id number]    Follow-up: Return in about 6 months (around 09/24/2021) for Follow up HTN [id number].    Vevelyn Francois, NP

## 2021-03-24 NOTE — Patient Instructions (Signed)
It is important to avoid accidents which may result in broken bones.  Here are a few ideas on how to make your home safer so you will be less likely to trip or fall.  1. Use nonskid mats or non slip strips in your shower or tub, on your bathroom floor and around sinks.  If you know that you have spilled water, wipe it up! 2. In the bathroom, it is important to have properly installed grab bars on the walls or on the edge of the tub.  Towel racks are NOT strong enough for you to hold onto or to pull on for support. 3. Stairs and hallways should have enough light.  Add lamps or night lights if you need ore light. 4. It is good to have handrails on both sides of the stairs if possible.  Always fix broken handrails right away. 5. It is important to see the edges of steps.  Paint the edges of outdoor steps white so you can see them better.  Put colored tape on the edge of inside steps. 6. Throw-rugs are dangerous because they can slide.  Removing the rugs is the best idea, but if they must stay, add adhesive carpet tape to prevent slipping. 7. Do not keep things on stairs or in the halls.  Remove small furniture that blocks the halls as it may cause you to trip.  Keep telephone and electrical cords out of the way where you walk. 8. Always were sturdy, rubber-soled shoes for good support.  Never wear just socks, especially on the stairs.  Socks may cause you to slip or fall.  Do not wear full-length housecoats as you can easily trip on the bottom.  9. Place the things you use the most on the shelves that are the easiest to reach.  If you use a stepstool, make sure it is in good condition.  If you feel unsteady, DO NOT climb, ask for help. 10. If a health professional advises you to use a cane or walker, do not be ashamed.  These items can keep you from falling and breaking your bones.   Preventing Falls and Fractures  Falls can be very serious, especially for older adults or people with  osteoporosis  Falls can be caused by:  Tripping or slipping  Slow reflexes  Balance problems  Reduced muscle strength  Poor vision or a recent change in prescription  Illness and some medications (especially blood pressure pills, diuretics, heart medicines, muscle relaxants and sleep medications)  Drinking alcohol  To prevent falls outdoors:  Use a can or walker if needed  Wear rubber-soled shoes so you don't slip  DO NOT buy "shape up" shoes with rocker bottom soles if you have balance problems.  The thick soles and shape make it more difficult to keep your balance.  Put kitty litter or salt on icy sidewalks  Walk on the grass if the sidewalks are slick  Avoid walking on uneven ground whenever possible  T prevent falls indoors:  Keep rooms clutter-free, especially hallways, stairs and paths to light switches  Remove throw rugs  Install night lights, especially to and in the bathroom  Turn on lights before going downstairs  Keep a flashlight next to your bed  Buy a cordless phone to keep with you instead of jumping up to answer the phone  Install grab bars in the bathroom near the shower and toilet  Install rails on both sides of the stairs.  Make sure the stairs  are well lit  Wear slippers with non-skid soles.  Do not walk around in stockings or socks  Balance problems and dizziness are not a normal part of growing older.  If you begin having balance problems or dizziness see your doctor.  Physical Therapy can help you with many balance problems, strengthening hip and leg muscles and with gait training.  To keep your bones healthy make sure you are getting enough calcium and Vitamin D each day.  Ask your doctor or pharmacist about supplements.  Regular weight-bearing exercise like walking, lifting weights or dancing can help strengthen bones and prevent osteoporosis.

## 2021-03-25 LAB — CBC WITH DIFFERENTIAL/PLATELET
Basophils Absolute: 0 10*3/uL (ref 0.0–0.2)
Basos: 0 %
EOS (ABSOLUTE): 0.1 10*3/uL (ref 0.0–0.4)
Eos: 2 %
Hematocrit: 35 % (ref 34.0–46.6)
Hemoglobin: 11.2 g/dL (ref 11.1–15.9)
Immature Grans (Abs): 0 10*3/uL (ref 0.0–0.1)
Immature Granulocytes: 0 %
Lymphocytes Absolute: 2.3 10*3/uL (ref 0.7–3.1)
Lymphs: 37 %
MCH: 31.3 pg (ref 26.6–33.0)
MCHC: 32 g/dL (ref 31.5–35.7)
MCV: 98 fL — ABNORMAL HIGH (ref 79–97)
Monocytes Absolute: 0.4 10*3/uL (ref 0.1–0.9)
Monocytes: 7 %
Neutrophils Absolute: 3.3 10*3/uL (ref 1.4–7.0)
Neutrophils: 54 %
Platelets: 208 10*3/uL (ref 150–450)
RBC: 3.58 x10E6/uL — ABNORMAL LOW (ref 3.77–5.28)
RDW: 13.5 % (ref 11.7–15.4)
WBC: 6.2 10*3/uL (ref 3.4–10.8)

## 2021-03-25 LAB — LIPID PANEL
Chol/HDL Ratio: 3.2 ratio (ref 0.0–4.4)
Cholesterol, Total: 234 mg/dL — ABNORMAL HIGH (ref 100–199)
HDL: 73 mg/dL (ref >39)
LDL Chol Calc (NIH): 130 mg/dL — ABNORMAL HIGH (ref 0–99)
Triglycerides: 178 mg/dL — ABNORMAL HIGH (ref 0–149)
VLDL Cholesterol Cal: 31 mg/dL (ref 5–40)

## 2021-03-25 LAB — COMP. METABOLIC PANEL (12)
AST: 17 IU/L (ref 0–40)
Albumin/Globulin Ratio: 1.5 (ref 1.2–2.2)
Albumin: 4.3 g/dL (ref 3.7–4.7)
Alkaline Phosphatase: 62 IU/L (ref 44–121)
BUN/Creatinine Ratio: 16 (ref 12–28)
BUN: 14 mg/dL (ref 8–27)
Bilirubin Total: <0.2 mg/dL (ref 0.0–1.2)
Calcium: 9 mg/dL (ref 8.7–10.3)
Chloride: 101 mmol/L (ref 96–106)
Creatinine, Ser: 0.89 mg/dL (ref 0.57–1.00)
Globulin, Total: 2.9 g/dL (ref 1.5–4.5)
Glucose: 93 mg/dL (ref 65–99)
Potassium: 4 mmol/L (ref 3.5–5.2)
Sodium: 143 mmol/L (ref 134–144)
Total Protein: 7.2 g/dL (ref 6.0–8.5)
eGFR: 68 mL/min/{1.73_m2} (ref >59)

## 2021-04-11 ENCOUNTER — Telehealth: Payer: Self-pay | Admitting: *Deleted

## 2021-04-11 ENCOUNTER — Other Ambulatory Visit: Payer: Self-pay

## 2021-04-11 ENCOUNTER — Ambulatory Visit (INDEPENDENT_AMBULATORY_CARE_PROVIDER_SITE_OTHER): Payer: Medicare Other | Admitting: Cardiology

## 2021-04-11 ENCOUNTER — Encounter: Payer: Self-pay | Admitting: Cardiology

## 2021-04-11 VITALS — BP 134/60 | HR 90 | Ht 64.0 in | Wt 171.0 lb

## 2021-04-11 DIAGNOSIS — G4733 Obstructive sleep apnea (adult) (pediatric): Secondary | ICD-10-CM

## 2021-04-11 DIAGNOSIS — I10 Essential (primary) hypertension: Secondary | ICD-10-CM

## 2021-04-11 DIAGNOSIS — E669 Obesity, unspecified: Secondary | ICD-10-CM

## 2021-04-11 MED ORDER — AMLODIPINE BESYLATE 5 MG PO TABS: ORAL_TABLET | ORAL | 3 refills | Status: DC

## 2021-04-11 NOTE — Telephone Encounter (Signed)
-----   Message from Antonieta Iba, RN sent at 04/11/2021  9:23 AM EDT ----- Per Dr. Radford Pax: decrease her PAP auto pressure to 4-10cm H2O Thanks!

## 2021-04-11 NOTE — Addendum Note (Signed)
Addended by: Antonieta Iba on: 04/11/2021 09:23 AM   Modules accepted: Orders

## 2021-04-11 NOTE — Telephone Encounter (Signed)
Order placed to [facility] to decrease her PAP auto pressure to 4-10cm H2O.

## 2021-04-11 NOTE — Progress Notes (Signed)
Cardiology Office Note:    Date:  04/11/2021   ID:  Beth Hubbard, Beth Hubbard 05-10-1946, MRN [id number]  PCP:  Vevelyn Francois, NP  Cardiologist:  Fransico Him, MD    Referring MD: Vevelyn Francois, NP   Chief Complaint  Patient presents with  . Sleep Apnea  . Hypertension    History of Present Illness:    Beth Hubbard is a 75 y.o. female with a hx ofmild OSA with an AHI of 11.9/hr with oxygen desaturations as low as 83% and is on CPAP at15cm H2O.   She is doing well with her CPAP device but says that the pressure on her PAP device is too high and then she cannot use it.   She tolerates the mask.  Since going on CPAP she feels rested in the am and has no significant daytime sleepiness.  She denies any significant mouth or nasal dryness or nasal congestion.  She does not think that she snores.     Past Medical History:  Diagnosis Date  . Arthritis   . Diabetes mellitus without complication ([place])   . Excessive daytime sleepiness   . Glaucoma   . High cholesterol   . Hypertension   . Mitral regurgitation    mild by echo 03/2017  . OSA (obstructive sleep apnea) 10/18/2015   Mild OSA with AHI 12.5/hr on CPAP at 8cm H2O  . Pneumonia   . Shingles   . TIA (transient ischemic attack)     Past Surgical History:  Procedure Laterality Date  . ABDOMINAL SURGERY    . EYE SURGERY Bilateral    cataract surgery  . ROTATOR CUFF REPAIR     bilateral  . TEE WITHOUT CARDIOVERSION N/A 07/20/2016   Procedure: TRANSESOPHAGEAL ECHOCARDIOGRAM (TEE);  Surgeon: Pixie Casino, MD;  Location: [facility];  Service: Cardiovascular;  Laterality: N/A;  . TOOTH EXTRACTION Left 10/09/2019   Procedure: DENTAL EXTRACTION X1 and Irrigation and Debridement.;  Surgeon: Diona Browner, DDS;  Location: [facility];  Service: Oral Surgery;  Laterality: Left;  DENTAL EXTRACTION X1 and Irrigation and Debridement.    Current Medications: Current Meds  Medication Sig  . ACCU-CHEK AVIVA PLUS test strip Reported on  02/24/2016  . Accu-Chek Softclix Lancets lancets Check BS once or twice a day.  Marland Kitchen amLODipine (NORVASC) 5 MG tablet TAKE 1 TABLET(5 MG) BY MOUTH DAILY  . Blood Glucose Monitoring Suppl (ACCU-CHEK AVIVA PLUS) w/Device KIT 1 kit by Does not apply route as directed. Check 1 to 2 times daily  . Blood Pressure Monitor KIT 1 kit by Does not apply route daily.  . COMBIGAN 0.2-0.5 % ophthalmic solution Place 1 drop into both eyes 2 (two) times daily.   . cyclobenzaprine (FLEXERIL) 10 MG tablet TAKE 1 TABLET(10 MG) BY MOUTH THREE TIMES DAILY AS NEEDED FOR MUSCLE SPASMS  . cycloSPORINE (RESTASIS) 0.05 % ophthalmic emulsion Place 1 drop into both eyes 2 (two) times daily.   . dorzolamide (TRUSOPT) 2 % ophthalmic solution Place 1 drop into both eyes 2 (two) times daily.   . ferrous sulfate 325 (65 FE) MG tablet Take 325 mg by mouth daily.   Marland Kitchen gabapentin (NEURONTIN) 300 MG capsule TAKE 1 CAPSULE BY MOUTH EVERY NIGHT AT BEDTIME  . HYDROcodone-acetaminophen (NORCO) 10-325 MG tablet Take 1 tablet by mouth every 8 (eight) hours as needed.  Marland Kitchen ROCKLATAN 0.02-0.005 % SOLN Place 1 drop into both eyes at bedtime.  . rosuvastatin (CRESTOR) 20 MG tablet TAKE 1 TABLET(20 MG)  BY MOUTH DAILY  . sitaGLIPtin (JANUVIA) 50 MG tablet TAKE 1 TABLET(50 MG) BY MOUTH DAILY  . traZODone (DESYREL) 50 MG tablet TAKE 1 TABLET BY MOUTH AT BEDTIME AS NEEDED FOR SLEEP     Allergies:   Aspirin, Penicillins, and Nsaids   Social History   Socioeconomic History  . Marital status: Legally Separated    Spouse name: Not on file  . Number of children: Not on file  . Years of education: Not on file  . Highest education level: Not on file  Occupational History  . Not on file  Tobacco Use  . Smoking status: Never Smoker  . Smokeless tobacco: Never Used  Vaping Use  . Vaping Use: Never used  Substance and Sexual Activity  . Alcohol use: No  . Drug use: No  . Sexual activity: Not on file  Other Topics Concern  . Not on file  Social  History Narrative  . Not on file   Social Determinants of Health   Financial Resource Strain: Not on file  Food Insecurity: Not on file  Transportation Needs: Not on file  Physical Activity: Not on file  Stress: Not on file  Social Connections: Not on file     Family History: The patient's family history includes Cancer in her brother, father, and sister. There is no history of Breast cancer.  ROS:   Please see the history of present illness.    ROS  All other systems reviewed and negative.   EKGs/Labs/Other Studies Reviewed:    The following studies were reviewed today: PAP compliance download  EKG:  EKG is ordered today and showed NSR  Recent Labs: 03/24/2021: BUN 14; Creatinine, Ser 0.89; Hemoglobin 11.2; Platelets 208; Potassium 4.0; Sodium 143   Recent Lipid Panel    Component Value Date/Time   CHOL 234 (H) 03/24/2021 0937   TRIG 178 (H) 03/24/2021 0937   HDL 73 03/24/2021 0937   CHOLHDL 3.2 03/24/2021 0937   CHOLHDL 2.7 04/08/2017 0909   VLDL 27 04/08/2017 0909   LDLCALC 130 (H) 03/24/2021 0937    Physical Exam:    VS:  BP 134/60   Pulse 90   Ht _0  (1.626 m)   Wt 171 lb (77.6 kg)   BMI 29.35 kg/m     Wt Readings from Last 3 Encounters:  04/11/21 171 lb (77.6 kg)  03/24/21 169 lb 9.6 oz (76.9 kg)  01/06/21 167 lb (75.8 kg)     GEN: Well nourished, well developed in no acute distress HEENT: Normal NECK: No JVD; No carotid bruits LYMPHATICS: No lymphadenopathy CARDIAC:RRR, no murmurs, rubs, gallops RESPIRATORY:  Clear to auscultation without rales, wheezing or rhonchi  ABDOMEN: Soft, non-tender, non-distended MUSCULOSKELETAL:  No edema; No deformity  SKIN: Warm and dry NEUROLOGIC:  Alert and oriented x 3 PSYCHIATRIC:  Normal affect    ASSESSMENT:    1. OSA (obstructive sleep apnea)   2. Essential hypertension   3. Obesity (BMI 30-39.9)    PLAN:    In order of problems listed above:  1.  OSA - The patient is tolerating PAP therapy  well but thinks that the pressure on her device is too high. The PAP download performed by his DME was personally reviewed and interpreted by me today and showed an AHI of 1.2/hr on auto PAP cm H2O with 70% compliance in using more than 4 hours nightly.  The patient has been using and benefiting from PAP use and will continue to benefit from therapy.  -  I will decrease her PAP auto pressure to 4-10cm H2O since she thinks the pressure is too high  2.  HTN -her BP is controlled on exam today -Continue prescription drug management with amlodipine 8m daily>>refilled for 1 year -encouraged to follow < 2gm Na diet  3.  Obesity -unfortunately she has not really lost any weight and has actually continued to gain weight -her BMI remains elevated -I discussed referral to healthy weight and wellness program>>she is in agreement so I will refer her   Medication Adjustments/Labs and Tests Ordered: Current medicines are reviewed at length with the patient today.  Concerns regarding medicines are outlined above.  Orders Placed This Encounter  Procedures  . EKG 12-Lead   No orders of the defined types were placed in this encounter.   Signed, TFransico Him MD  04/11/2021 9:18 AM    [place]

## 2021-04-11 NOTE — Patient Instructions (Signed)
Medication Instructions:  Your physician recommends that you continue on your current medications as directed. Please refer to the Current Medication list given to you today.  *If you need a refill on your cardiac medications before your next appointment, please call your pharmacy*  Follow-Up: At [facility], you and your health needs are our priority.  As part of our continuing mission to provide you with exceptional heart care, we have created designated Provider Care Teams.  These Care Teams include your primary Cardiologist (physician) and Advanced Practice Providers (APPs -  Physician Assistants and Nurse Practitioners) who all work together to provide you with the care you need, when you need it.  Your next appointment:   1 year(s)  The format for your next appointment:   In Person  Provider:   You may see Fransico Him, MD or one of the following Advanced Practice Providers on your designated Care Team:    Melina Copa, PA-C  Ermalinda Barrios, PA-C    Other Instructions You have been referred to see the Healthy Weight and Wellness Program

## 2021-04-13 ENCOUNTER — Telehealth: Payer: Self-pay | Admitting: *Deleted

## 2021-04-13 NOTE — Telephone Encounter (Signed)
The patient has been notified of the result and verbalized understanding.  All questions (if any) were answered.     

## 2021-04-13 NOTE — Telephone Encounter (Signed)
-----   Message from Sueanne Margarita, MD sent at 04/12/2021  8:58 AM EDT ----- Good AHI and compliance.  Continue current PAP settings.

## 2021-04-14 DIAGNOSIS — H3581 Retinal edema: Secondary | ICD-10-CM | POA: Diagnosis not present

## 2021-04-21 ENCOUNTER — Other Ambulatory Visit: Payer: Self-pay | Admitting: Ophthalmology

## 2021-04-21 ENCOUNTER — Ambulatory Visit
Admission: RE | Admit: 2021-04-21 | Discharge: 2021-04-21 | Disposition: A | Discharge status: Home / Self Care | Payer: Medicare Other | Source: Ambulatory Visit | Attending: Ophthalmology | Admitting: Ophthalmology

## 2021-04-21 DIAGNOSIS — H401131 Primary open-angle glaucoma, bilateral, mild stage: Secondary | ICD-10-CM | POA: Diagnosis not present

## 2021-04-21 DIAGNOSIS — H3091 Unspecified chorioretinal inflammation, right eye: Secondary | ICD-10-CM

## 2021-04-21 DIAGNOSIS — H35351 Cystoid macular degeneration, right eye: Secondary | ICD-10-CM | POA: Diagnosis not present

## 2021-04-21 DIAGNOSIS — R918 Other nonspecific abnormal finding of lung field: Secondary | ICD-10-CM | POA: Diagnosis not present

## 2021-04-21 DIAGNOSIS — J42 Unspecified chronic bronchitis: Secondary | ICD-10-CM | POA: Diagnosis not present

## 2021-04-21 DIAGNOSIS — H3090 Unspecified chorioretinal inflammation, unspecified eye: Secondary | ICD-10-CM | POA: Diagnosis not present

## 2021-05-01 ENCOUNTER — Encounter (HOSPITAL_COMMUNITY): Payer: Self-pay

## 2021-05-01 ENCOUNTER — Emergency Department (HOSPITAL_COMMUNITY)
Admission: EM | Admit: 2021-05-01 | Discharge: 2021-05-02 | Disposition: A | Discharge status: Home / Self Care | Payer: Medicare Other | Attending: Physician Assistant | Admitting: Physician Assistant

## 2021-05-01 ENCOUNTER — Emergency Department (HOSPITAL_COMMUNITY): Payer: Medicare Other

## 2021-05-01 DIAGNOSIS — M1712 Unilateral primary osteoarthritis, left knee: Secondary | ICD-10-CM | POA: Diagnosis not present

## 2021-05-01 DIAGNOSIS — M79605 Pain in left leg: Secondary | ICD-10-CM | POA: Insufficient documentation

## 2021-05-01 DIAGNOSIS — M1612 Unilateral primary osteoarthritis, left hip: Secondary | ICD-10-CM | POA: Diagnosis not present

## 2021-05-01 DIAGNOSIS — M25552 Pain in left hip: Secondary | ICD-10-CM | POA: Insufficient documentation

## 2021-05-01 DIAGNOSIS — M25512 Pain in left shoulder: Secondary | ICD-10-CM | POA: Diagnosis not present

## 2021-05-01 DIAGNOSIS — M545 Low back pain, unspecified: Secondary | ICD-10-CM | POA: Diagnosis not present

## 2021-05-01 DIAGNOSIS — S0990XA Unspecified injury of head, initial encounter: Secondary | ICD-10-CM | POA: Diagnosis not present

## 2021-05-01 DIAGNOSIS — W19XXXA Unspecified fall, initial encounter: Secondary | ICD-10-CM | POA: Diagnosis not present

## 2021-05-01 DIAGNOSIS — M542 Cervicalgia: Secondary | ICD-10-CM | POA: Insufficient documentation

## 2021-05-01 DIAGNOSIS — R519 Headache, unspecified: Secondary | ICD-10-CM | POA: Insufficient documentation

## 2021-05-01 DIAGNOSIS — Y9389 Activity, other specified: Secondary | ICD-10-CM | POA: Insufficient documentation

## 2021-05-01 DIAGNOSIS — M79602 Pain in left arm: Secondary | ICD-10-CM | POA: Diagnosis not present

## 2021-05-01 DIAGNOSIS — M25562 Pain in left knee: Secondary | ICD-10-CM | POA: Diagnosis not present

## 2021-05-01 DIAGNOSIS — Z5321 Procedure and treatment not carried out due to patient leaving prior to being seen by health care provider: Secondary | ICD-10-CM | POA: Insufficient documentation

## 2021-05-01 DIAGNOSIS — J929 Pleural plaque without asbestos: Secondary | ICD-10-CM | POA: Diagnosis not present

## 2021-05-01 DIAGNOSIS — Z743 Need for continuous supervision: Secondary | ICD-10-CM | POA: Diagnosis not present

## 2021-05-01 DIAGNOSIS — W010XXA Fall on same level from slipping, tripping and stumbling without subsequent striking against object, initial encounter: Secondary | ICD-10-CM | POA: Diagnosis not present

## 2021-05-01 DIAGNOSIS — Z9889 Other specified postprocedural states: Secondary | ICD-10-CM | POA: Diagnosis not present

## 2021-05-01 DIAGNOSIS — S199XXA Unspecified injury of neck, initial encounter: Secondary | ICD-10-CM | POA: Diagnosis not present

## 2021-05-01 DIAGNOSIS — M25462 Effusion, left knee: Secondary | ICD-10-CM | POA: Diagnosis not present

## 2021-05-01 DIAGNOSIS — R6889 Other general symptoms and signs: Secondary | ICD-10-CM | POA: Diagnosis not present

## 2021-05-01 DIAGNOSIS — M25519 Pain in unspecified shoulder: Secondary | ICD-10-CM | POA: Diagnosis not present

## 2021-05-01 NOTE — ED Triage Notes (Signed)
Per EMS- Patient states she tripped and fell while going to get her mail. Patient fell on her left side. Non LOC. No blood thinners.  Patient c/o left neck, shoulder, arm, left hip and left leg pain

## 2021-05-01 NOTE — ED Provider Notes (Signed)
Emergency Medicine Provider Triage Evaluation Note  Beth Hubbard , a 75 y.o. female  was evaluated in triage.  Pt complains of a fall that occurred pta. She states that her left leg gave out on her. She states she hit her head but she does not think she lost consciousness. She is c/o pain to her head, left shoulder, left knee, lower back, neck and left shoulder.  Review of Systems  Positive: head, left shoulder, left knee, lower back, neck and left shoulder. Negative: loc  Physical Exam  BP (!) 150/63 (BP Location: Right Arm)   Pulse 89   Temp 98.6 F (37 C) (Oral)   Resp 16   SpO2 98%  Gen:   Awake, no distress   Resp:  Normal effort  MSK:   Moves extremities without difficulty  Other:  Ttp to lumbar spine, left hip, left knee, left shoulder, cervical spine. Clear speech  Medical Decision Making  Medically screening exam initiated at 7:41 PM.  Appropriate orders placed.  Kippy Melena Brostrom was informed that the remainder of the evaluation will be completed by another provider, this initial triage assessment does not replace that evaluation, and the importance of remaining in the ED until their evaluation is complete.     Bishop Dublin 05/01/21 1947    Truddie Hidden, MD 05/01/21 [phone]

## 2021-05-01 NOTE — ED Notes (Signed)
Pt left the building.

## 2021-05-02 DIAGNOSIS — M4802 Spinal stenosis, cervical region: Secondary | ICD-10-CM | POA: Diagnosis not present

## 2021-05-02 DIAGNOSIS — M47812 Spondylosis without myelopathy or radiculopathy, cervical region: Secondary | ICD-10-CM | POA: Diagnosis not present

## 2021-05-02 DIAGNOSIS — M4726 Other spondylosis with radiculopathy, lumbar region: Secondary | ICD-10-CM | POA: Diagnosis not present

## 2021-05-02 DIAGNOSIS — M25512 Pain in left shoulder: Secondary | ICD-10-CM | POA: Diagnosis not present

## 2021-05-02 DIAGNOSIS — M25561 Pain in right knee: Secondary | ICD-10-CM | POA: Diagnosis not present

## 2021-05-12 DIAGNOSIS — H35351 Cystoid macular degeneration, right eye: Secondary | ICD-10-CM | POA: Diagnosis not present

## 2021-05-12 DIAGNOSIS — H3091 Unspecified chorioretinal inflammation, right eye: Secondary | ICD-10-CM | POA: Diagnosis not present

## 2021-05-26 DIAGNOSIS — H401112 Primary open-angle glaucoma, right eye, moderate stage: Secondary | ICD-10-CM | POA: Diagnosis not present

## 2021-05-30 DIAGNOSIS — G4733 Obstructive sleep apnea (adult) (pediatric): Secondary | ICD-10-CM | POA: Diagnosis not present

## 2021-05-30 DIAGNOSIS — E119 Type 2 diabetes mellitus without complications: Secondary | ICD-10-CM | POA: Diagnosis not present

## 2021-05-30 DIAGNOSIS — H401112 Primary open-angle glaucoma, right eye, moderate stage: Secondary | ICD-10-CM | POA: Diagnosis not present

## 2021-05-30 DIAGNOSIS — Z01818 Encounter for other preprocedural examination: Secondary | ICD-10-CM | POA: Diagnosis not present

## 2021-06-02 DIAGNOSIS — H401112 Primary open-angle glaucoma, right eye, moderate stage: Secondary | ICD-10-CM | POA: Diagnosis not present

## 2021-06-02 DIAGNOSIS — H409 Unspecified glaucoma: Secondary | ICD-10-CM | POA: Diagnosis not present

## 2021-06-09 DIAGNOSIS — H35351 Cystoid macular degeneration, right eye: Secondary | ICD-10-CM | POA: Diagnosis not present

## 2021-06-09 DIAGNOSIS — H401112 Primary open-angle glaucoma, right eye, moderate stage: Secondary | ICD-10-CM | POA: Diagnosis not present

## 2021-06-09 DIAGNOSIS — H3091 Unspecified chorioretinal inflammation, right eye: Secondary | ICD-10-CM | POA: Diagnosis not present

## 2021-08-07 ENCOUNTER — Telehealth: Payer: Self-pay

## 2021-08-07 NOTE — Telephone Encounter (Signed)
Patient called and advised this call is to schedule the AWV appointment via telephone with a nurse, patient verbalized understanding and asks for a Friday or Tuesday. Appointment scheduled for Tuesday, 08/15/21 at 1800.

## 2021-08-15 ENCOUNTER — Other Ambulatory Visit: Payer: Self-pay

## 2021-08-15 ENCOUNTER — Ambulatory Visit (INDEPENDENT_AMBULATORY_CARE_PROVIDER_SITE_OTHER): Payer: Medicare Other

## 2021-08-15 DIAGNOSIS — Z0000 Encounter for general adult medical examination without abnormal findings: Secondary | ICD-10-CM

## 2021-08-15 NOTE — Progress Notes (Signed)
Subjective:   Beth Hubbard is a 75 y.o. female who presents for an Initial Medicare Annual Wellness Visit.  I connected with  Claudeen Leason Lewellyn on 08/15/21 by a video enabled telemedicine application and verified that I am speaking with the correct person using two identifiers.   I discussed the limitations of evaluation and management by telemedicine. The patient expressed understanding and agreed to proceed.  Location of patient: Home Location of Provider: Home Office  Review of Systems    Defer to PCP Cardiac Risk Factors include: advanced age (>22mn, >>41women);diabetes mellitus;dyslipidemia;hypertension     Objective:    Today's Vitals   08/15/21 1808  PainSc: 6    There is no height or weight on file to calculate BMI.  Advanced Directives 08/15/2021 05/01/2021 02/27/2020 10/09/2019 07/21/2019 08/15/2018 04/22/2018  Does Patient Have a Medical Advance Directive? No No No No No No No  Would patient like information on creating a medical advance directive? No - Patient declined - No - Patient declined No - Patient declined Yes (MAU/Ambulatory/Procedural Areas - Information given) No - Patient declined No - Patient declined    Current Medications (verified) Outpatient Encounter Medications as of 08/15/2021  Medication Sig   brimonidine (ALPHAGAN) 0.2 % ophthalmic solution Place 1 drop into the left eye 2 (two) times daily.   prednisoLONE acetate (PRED FORTE) 1 % ophthalmic suspension Place 1 drop into the right eye in the morning and at bedtime.   ACCU-CHEK AVIVA PLUS test strip Reported on 02/24/2016   Accu-Chek Softclix Lancets lancets Check BS once or twice a day.   amLODipine (NORVASC) 5 MG tablet TAKE 1 TABLET(5 MG) BY MOUTH DAILY   Blood Glucose Monitoring Suppl (ACCU-CHEK AVIVA PLUS) w/Device KIT 1 kit by Does not apply route as directed. Check 1 to 2 times daily   Blood Pressure Monitor KIT 1 kit by Does not apply route daily.   COMBIGAN 0.2-0.5 % ophthalmic solution Place 1  drop into both eyes 2 (two) times daily.  (Patient not taking: Reported on 08/15/2021)   cyclobenzaprine (FLEXERIL) 10 MG tablet TAKE 1 TABLET(10 MG) BY MOUTH THREE TIMES DAILY AS NEEDED FOR MUSCLE SPASMS   cycloSPORINE (RESTASIS) 0.05 % ophthalmic emulsion Place 1 drop into both eyes 2 (two) times daily.  (Patient not taking: Reported on 08/15/2021)   dorzolamide (TRUSOPT) 2 % ophthalmic solution Place 1 drop into the left eye 2 (two) times daily.   ferrous sulfate 325 (65 FE) MG tablet Take 325 mg by mouth daily.    gabapentin (NEURONTIN) 300 MG capsule TAKE 1 CAPSULE BY MOUTH EVERY NIGHT AT BEDTIME   HYDROcodone-acetaminophen (NORCO) 10-325 MG tablet Take 1 tablet by mouth every 8 (eight) hours as needed.   ROCKLATAN 0.02-0.005 % SOLN Place 1 drop into both eyes at bedtime. (Patient not taking: Reported on 08/15/2021)   rosuvastatin (CRESTOR) 20 MG tablet TAKE 1 TABLET(20 MG) BY MOUTH DAILY   sitaGLIPtin (JANUVIA) 50 MG tablet TAKE 1 TABLET(50 MG) BY MOUTH DAILY   traZODone (DESYREL) 50 MG tablet TAKE 1 TABLET BY MOUTH AT BEDTIME AS NEEDED FOR SLEEP   No facility-administered encounter medications on file as of 08/15/2021.    Allergies (verified) Aspirin, Penicillins, and Nsaids   History: Past Medical History:  Diagnosis Date   Arthritis    Diabetes mellitus without complication (HCC)    Excessive daytime sleepiness    Glaucoma    High cholesterol    Hypertension    Mitral regurgitation  mild by echo 03/2017   OSA (obstructive sleep apnea) 10/18/2015   Mild OSA with AHI 12.5/hr on CPAP at 8cm H2O   Pneumonia    Shingles    TIA (transient ischemic attack)    Past Surgical History:  Procedure Laterality Date   ABDOMINAL SURGERY     EYE SURGERY Bilateral    cataract surgery   ROTATOR CUFF REPAIR     bilateral   TEE WITHOUT CARDIOVERSION N/A 07/20/2016   Procedure: TRANSESOPHAGEAL ECHOCARDIOGRAM (TEE);  Surgeon: Pixie Casino, MD;  Location: [facility];  Service:  Cardiovascular;  Laterality: N/A;   TOOTH EXTRACTION Left 10/09/2019   Procedure: DENTAL EXTRACTION X1 and Irrigation and Debridement.;  Surgeon: Diona Browner, DDS;  Location: [facility];  Service: Oral Surgery;  Laterality: Left;  DENTAL EXTRACTION X1 and Irrigation and Debridement.   Family History  Problem Relation Age of Onset   Cancer Father    Cancer Sister    Cancer Brother    Breast cancer Neg Hx    Social History   Socioeconomic History   Marital status: Legally Separated    Spouse name: Not on file   Number of children: Not on file   Years of education: Not on file   Highest education level: Not on file  Occupational History   Not on file  Tobacco Use   Smoking status: Never   Smokeless tobacco: Never  Vaping Use   Vaping Use: Never used  Substance and Sexual Activity   Alcohol use: No   Drug use: No   Sexual activity: Not Currently  Other Topics Concern   Not on file  Social History Narrative   Not on file   Social Determinants of Health   Financial Resource Strain: Low Risk    Difficulty of Paying Living Expenses: Not hard at all  Food Insecurity: Food Insecurity Present   Worried About Charity fundraiser in the Last Year: Never true   Ran Out of Food in the Last Year: Sometimes true  Transportation Needs: No Transportation Needs   Lack of Transportation (Medical): No   Lack of Transportation (Non-Medical): No  Physical Activity: Sufficiently Active   Days of Exercise per Week: 7 days   Minutes of Exercise per Session: 150+ min  Stress: No Stress Concern Present   Feeling of Stress : Only a little  Social Connections: Moderately Integrated   Frequency of Communication with Friends and Family: More than three times a week   Frequency of Social Gatherings with Friends and Family: Once a week   Attends Religious Services: More than 4 times per year   Active Member of [employer] or Organizations: Yes   Attends Music therapist: More than 4 times per  year   Marital Status: Separated    Tobacco Counseling Counseling given: Not Answered   Clinical Intake:  Pre-visit preparation completed: Yes  Pain : 0-10 Pain Score: 6  Pain Type: Chronic pain Pain Location: Arm Pain Orientation: Left Pain Radiating Towards: Elbow Pain Descriptors / Indicators: Aching Pain Onset: More than a month ago Pain Frequency: Intermittent     Nutritional Risks: None Diabetes: Yes CBG done?: Yes CBG resulted in Enter/ Edit results?: No Did pt. bring in CBG monitor from home?: No  How often do you need to have someone help you when you read instructions, pamphlets, or other written materials from your doctor or pharmacy?: 1 - Never What is the last grade level you completed in school?: 9th grade  Diabetic?Yes  Interpreter Needed?: No  Information entered by :: Janan Ridge, RN   Activities of Daily Living In your present state of health, do you have any difficulty performing the following activities: 08/15/2021 03/24/2021  Hearing? N N  Vision? N Y  Difficulty concentrating or making decisions? Y N  Walking or climbing stairs? Y N  Dressing or bathing? N N  Doing errands, shopping? Y N  Preparing Food and eating ? N -  Using the Toilet? N -  In the past six months, have you accidently leaked urine? N -  Do you have problems with loss of bowel control? N -  Managing your Medications? Y -  Managing your Finances? N -  Housekeeping or managing your Housekeeping? Y -  Some recent data might be hidden    Patient Care Team: Vevelyn Francois, NP as PCP - General (Adult Health Nurse Practitioner) Sueanne Margarita, MD as PCP - Cardiology (Cardiology) Marily Memos, MD as Consulting Physician (Orthopedic Surgery) Sueanne Margarita, MD as Consulting Physician (Cardiology)  Indicate any recent Medical Services you may have received from other than [facility] providers in the past year (date may be approximate).     Assessment:   This is a routine  wellness examination for Olena.  Hearing/Vision screen No results found.  Dietary issues and exercise activities discussed: Current Exercise Habits: Home exercise routine, Time (Minutes): 20, Frequency (Times/Week): 7, Weekly Exercise (Minutes/Week): 140, Intensity: Moderate, Exercise limited by: None identified   Goals Addressed   None   Depression Screen PHQ 2/9 Scores 03/24/2021 09/23/2020 06/24/2020 12/25/2019 07/03/2019 08/15/2018 04/11/2018  PHQ - 2 Score 0 0 0 1 0 0 0  Exception Documentation - Medical reason - - - - -    Fall Risk Fall Risk  08/15/2021 03/24/2021 09/23/2020 06/24/2020 12/25/2019  Falls in the past year? 1 1 0 0 0  Number falls in past yr: 1 0 0 - -  Injury with Fall? 1 1 0 - -  Comment - arm and back - - -  Risk for fall due to : History of fall(s) - - - -  Follow up Falls evaluation completed - - - -    FALL RISK PREVENTION PERTAINING TO THE HOME:  Any stairs in or around the home? No  If so, are there any without handrails?  N/A Home free of loose throw rugs in walkways, pet beds, electrical cords, etc? No  Adequate lighting in your home to reduce risk of falls? Yes   ASSISTIVE DEVICES UTILIZED TO PREVENT FALLS:  Life alert? No  Use of a cane, walker or w/c? Yes  Grab bars in the bathroom? Yes  Shower chair or bench in shower? Yes  Elevated toilet seat or a handicapped toilet? No   TIMED UP AND GO:  Was the test performed?  N/A .  Length of time to ambulate 10 feet: N/A sec.    Cognitive Function:     6CIT Screen 08/15/2021  What Year? 0 points  What month? 0 points  What time? 0 points  Count back from 20 0 points  Months in reverse 0 points  Repeat phrase 0 points  Total Score 0    Immunizations Immunization History  Administered Date(s) Administered   Influenza, High Dose Seasonal PF 09/23/2018   Influenza,inj,Quad PF,6+ Mos 09/23/2020   Influenza-Unspecified 06/19/2013   PFIZER(Purple Top)SARS-COV-2 Vaccination 04/28/2020, 05/27/2020    Pneumococcal Conjugate-13 02/24/2015   Pneumococcal Polysaccharide-23 03/05/2016   Tdap 02/24/2015  TDAP status: Up to date  Flu Vaccine status: Up to date  Pneumococcal vaccine status: Up to date  Covid-19 vaccine status: Completed vaccines  Qualifies for Shingles Vaccine? Yes   Zostavax completed  Unknown   Shingrix Completed?: No.    Education has been provided regarding the importance of this vaccine. Patient has been advised to call insurance company to determine out of pocket expense if they have not yet received this vaccine. Advised may also receive vaccine at local pharmacy or Health Dept. Verbalized acceptance and understanding.  Screening Tests Health Maintenance  Topic Date Due   Zoster Vaccines- Shingrix (1 of 2) Never done   OPHTHALMOLOGY EXAM  09/04/2018   COVID-19 Vaccine (3 - Booster for Pfizer series) 10/27/2020   FOOT EXAM  06/24/2021   URINE MICROALBUMIN  06/24/2021   HEMOGLOBIN A1C  09/24/2021   COLONOSCOPY (Pts 45-25yr Insurance coverage will need to be confirmed)  02/28/2023   TETANUS/TDAP  02/23/2025   DEXA SCAN  Completed   Hepatitis C Screening  Completed   HPV VACCINES  Aged Out    Health Maintenance  Health Maintenance Due  Topic Date Due   Zoster Vaccines- Shingrix (1 of 2) Never done   OPHTHALMOLOGY EXAM  09/04/2018   COVID-19 Vaccine (3 - Booster for Pfizer series) 10/27/2020   FOOT EXAM  06/24/2021   URINE MICROALBUMIN  06/24/2021    Colorectal cancer screening: Type of screening: Colonoscopy. Completed 03/02/2013. Repeat every 10 years  Mammogram status: Completed 09/09/20. Repeat every year  Bone Density status: Completed 03/29/2016. Results reflect: Bone density results: NORMAL. Repeat every N/A years.  Lung Cancer Screening: (Low Dose CT Chest recommended if Age 76-80years, 30 pack-year currently smoking OR have quit w/in 15years.) does not qualify.   Lung Cancer Screening Referral: N/A  Additional Screening:  Hepatitis C  Screening: does qualify; Completed 03/05/2016  Vision Screening: Recommended annual ophthalmology exams for early detection of glaucoma and other disorders of the eye. Is the patient up to date with their annual eye exam?  Yes  Who is the provider or what is the name of the office in which the patient attends annual eye exams[place] not established with a provider, would they like to be referred to a provider to establish care? No .   Dental Screening: Recommended annual dental exams for proper oral hygiene  Community Resource Referral / Chronic Care Management: CRR required this visit?  No   CCM required this visit?  No      Plan:     I have personally reviewed and noted the following in the patient's chart:   Medical and social history Use of alcohol, tobacco or illicit drugs  Current medications and supplements including opioid prescriptions. Patient is currently taking opioid prescriptions. Information provided to patient regarding non-opioid alternatives. Patient advised to discuss non-opioid treatment plan with their provider. Functional ability and status Nutritional status Physical activity Advanced directives List of other physicians Hospitalizations, surgeries, and ER visits in previous 12 months Vitals Screenings to include cognitive, depression, and falls Referrals and appointments  In addition, I have reviewed and discussed with patient certain preventive protocols, quality metrics, and best practice recommendations. A written personalized care plan for preventive services as well as general preventive health recommendations were provided to patient.     CMatilde Sprang [place]  08/15/2021   Nurse Notes: 52 minutes non face to face visit.   Ms. LMarter, Thank you for taking  time to come for your Medicare Wellness Visit. I appreciate your ongoing commitment to your health goals. Please review the following plan we discussed and let me know if  I can assist you in the future.   These are the goals we discussed:  Goals   None     This is a list of the screening recommended for you and due dates:  Health Maintenance  Topic Date Due   Zoster (Shingles) Vaccine (1 of 2) Never done   Eye exam for diabetics  09/04/2018   COVID-19 Vaccine (3 - Booster for Pfizer series) 10/27/2020   Complete foot exam   06/24/2021   Urine Protein Check  06/24/2021   Hemoglobin A1C  09/24/2021   Colon Cancer Screening  02/28/2023   Tetanus Vaccine  02/23/2025   DEXA scan (bone density measurement)  Completed   Hepatitis C Screening: USPSTF Recommendation to screen - Ages 79-79 yo.  Completed   HPV Vaccine  Aged Out

## 2021-08-29 DIAGNOSIS — M5412 Radiculopathy, cervical region: Secondary | ICD-10-CM | POA: Diagnosis not present

## 2021-09-22 ENCOUNTER — Ambulatory Visit (INDEPENDENT_AMBULATORY_CARE_PROVIDER_SITE_OTHER): Payer: Medicare Other | Admitting: Nurse Practitioner

## 2021-09-22 ENCOUNTER — Other Ambulatory Visit: Payer: Self-pay

## 2021-09-22 ENCOUNTER — Encounter: Payer: Self-pay | Admitting: Nurse Practitioner

## 2021-09-22 VITALS — BP 147/62 | HR 95 | Temp 97.9°F | Ht 64.0 in | Wt 179.0 lb

## 2021-09-22 DIAGNOSIS — R809 Proteinuria, unspecified: Secondary | ICD-10-CM

## 2021-09-22 DIAGNOSIS — M542 Cervicalgia: Secondary | ICD-10-CM

## 2021-09-22 DIAGNOSIS — E785 Hyperlipidemia, unspecified: Secondary | ICD-10-CM

## 2021-09-22 DIAGNOSIS — E1129 Type 2 diabetes mellitus with other diabetic kidney complication: Secondary | ICD-10-CM | POA: Diagnosis not present

## 2021-09-22 DIAGNOSIS — R82998 Other abnormal findings in urine: Secondary | ICD-10-CM

## 2021-09-22 DIAGNOSIS — I10 Essential (primary) hypertension: Secondary | ICD-10-CM | POA: Diagnosis not present

## 2021-09-22 DIAGNOSIS — G4733 Obstructive sleep apnea (adult) (pediatric): Secondary | ICD-10-CM | POA: Diagnosis not present

## 2021-09-22 DIAGNOSIS — G4700 Insomnia, unspecified: Secondary | ICD-10-CM

## 2021-09-22 LAB — POCT GLYCOSYLATED HEMOGLOBIN (HGB A1C)
HbA1c POC (<> result, manual entry): 7.9 % (ref 4.0–5.6)
HbA1c, POC (controlled diabetic range): 7.9 % — AB (ref 0.0–7.0)
HbA1c, POC (prediabetic range): 7.9 % — AB (ref 5.7–6.4)
Hemoglobin A1C: 7.9 % — AB (ref 4.0–5.6)

## 2021-09-22 LAB — POCT URINALYSIS DIP (CLINITEK)
Bilirubin, UA: NEGATIVE
Blood, UA: NEGATIVE
Glucose, UA: 100 mg/dL — AB
Ketones, POC UA: NEGATIVE mg/dL
Nitrite, UA: NEGATIVE
POC PROTEIN,UA: NEGATIVE
Spec Grav, UA: >=1.03 — AB (ref 1.010–1.025)
Urobilinogen, UA: 0.2 E.U./dL
pH, UA: 5.5 (ref 5.0–8.0)

## 2021-09-22 MED ORDER — GABAPENTIN 300 MG PO CAPS: ORAL_CAPSULE | ORAL | 3 refills | Status: DC

## 2021-09-22 MED ORDER — SITAGLIPTIN PHOSPHATE 50 MG PO TABS
ORAL_TABLET | ORAL | 3 refills | Status: DC
Start: 2021-09-22 — End: 2022-10-01

## 2021-09-22 MED ORDER — TRAZODONE HCL 100 MG PO TABS: 100.0000 mg | ORAL_TABLET | Freq: Every day | ORAL | 3 refills | Status: DC

## 2021-09-22 MED ORDER — NITROFURANTOIN MONOHYD MACRO 100 MG PO CAPS: 100.0000 mg | ORAL_CAPSULE | Freq: Two times a day (BID) | ORAL | 0 refills | Status: AC

## 2021-09-22 NOTE — Patient Instructions (Signed)
Diabetes Mellitus and Nutrition, Adult ?When you have diabetes, or diabetes mellitus, it is very important to have healthy eating habits because your blood sugar (glucose) levels are greatly affected by what you eat and drink. Eating healthy foods in the right amounts, at about the same times every day, can help you: ?Manage your blood glucose. ?Lower your risk of heart disease. ?Improve your blood pressure. ?Reach or maintain a healthy weight. ?What can affect my meal plan? ?Every person with diabetes is different, and each person has different needs for a meal plan. Your health care provider may recommend that you work with a dietitian to make a meal plan that is best for you. Your meal plan may vary depending on factors such as: ?The calories you need. ?The medicines you take. ?Your weight. ?Your blood glucose, blood pressure, and cholesterol levels. ?Your activity level. ?Other health conditions you have, such as heart or kidney disease. ?How do carbohydrates affect me? ?Carbohydrates, also called carbs, affect your blood glucose level more than any other type of food. Eating carbs raises the amount of glucose in your blood. ?It is important to know how many carbs you can safely have in each meal. This is different for every person. Your dietitian can help you calculate how many carbs you should have at each meal and for each snack. ?How does alcohol affect me? ?Alcohol can cause a decrease in blood glucose (hypoglycemia), especially if you use insulin or take certain diabetes medicines by mouth. Hypoglycemia can be a life-threatening condition. Symptoms of hypoglycemia, such as sleepiness, dizziness, and confusion, are similar to symptoms of having too much alcohol. ?Do not drink alcohol if: ?Your health care provider tells you not to drink. ?You are pregnant, may be pregnant, or are planning to become pregnant. ?If you drink alcohol: ?Limit how much you have to: ?0-1 drink a day for women. ?0-2 drinks a day  for men. ?Know how much alcohol is in your drink. In the [place] one drink equals one 12 oz bottle of beer (355 mL), one 5 oz glass of wine (148 mL), or one 1? oz glass of hard liquor (44 mL). ?Keep yourself hydrated with water, diet soda, or unsweetened iced tea. Keep in mind that regular soda, juice, and other mixers may contain a lot of sugar and must be counted as carbs. ?What are tips for following this plan? ?Reading food labels ?Start by checking the serving size on the Nutrition Facts label of packaged foods and drinks. The number of calories and the amount of carbs, fats, and other nutrients listed on the label are based on one serving of the item. Many items contain more than one serving per package. ?Check the total grams (g) of carbs in one serving. ?Check the number of grams of saturated fats and trans fats in one serving. Choose foods that have a low amount or none of these fats. ?Check the number of milligrams (mg) of salt (sodium) in one serving. Most people should limit total sodium intake to less than 2,300 mg per day. ?Always check the nutrition information of foods labeled as "low-fat" or "nonfat." These foods may be higher in added sugar or refined carbs and should be avoided. ?Talk to your dietitian to identify your daily goals for nutrients listed on the label. ?Shopping ?Avoid buying canned, pre-made, or processed foods. These foods tend to be high in fat, sodium, and added sugar. ?Shop around the outside edge of the grocery store. This is where you will   most often find fresh fruits and vegetables, bulk grains, fresh meats, and fresh dairy products. Cooking Use low-heat cooking methods, such as baking, instead of high-heat cooking methods, such as deep frying. Cook using healthy oils, such as olive, canola, or sunflower oil. Avoid cooking with butter, cream, or high-fat meats. Meal planning Eat meals and snacks regularly, preferably at the same times every day. Avoid going long periods of  time without eating. Eat foods that are high in fiber, such as fresh fruits, vegetables, beans, and whole grains. Eat 4-6 oz (112-168 g) of lean protein each day, such as lean meat, chicken, fish, eggs, or tofu. One ounce (oz) (28 g) of lean protein is equal to: 1 oz (28 g) of meat, chicken, or fish. 1 egg.  cup (62 g) of tofu. Eat some foods each day that contain healthy fats, such as avocado, nuts, seeds, and fish. What foods should I eat? Fruits Berries. Apples. Oranges. Peaches. Apricots. Plums. Grapes. Mangoes. Papayas. Pomegranates. Kiwi. Cherries. Vegetables Leafy greens, including lettuce, spinach, kale, chard, collard greens, mustard greens, and cabbage. Beets. Cauliflower. Broccoli. Carrots. Green beans. Tomatoes. Peppers. Onions. Cucumbers. Brussels sprouts. Grains Whole grains, such as whole-wheat or whole-grain bread, crackers, tortillas, cereal, and pasta. Unsweetened oatmeal. Quinoa. Brown or wild rice. Meats and other proteins Seafood. Poultry without skin. Lean cuts of poultry and beef. Tofu. Nuts. Seeds. Dairy Low-fat or fat-free dairy products such as milk, yogurt, and cheese. The items listed above may not be a complete list of foods and beverages you can eat and drink. Contact a dietitian for more information. What foods should I avoid? Fruits Fruits canned with syrup. Vegetables Canned vegetables. Frozen vegetables with butter or cream sauce. Grains Refined white flour and flour products such as bread, pasta, snack foods, and cereals. Avoid all processed foods. Meats and other proteins Fatty cuts of meat. Poultry with skin. Breaded or fried meats. Processed meat. Avoid saturated fats. Dairy Full-fat yogurt, cheese, or milk. Beverages Sweetened drinks, such as soda or iced tea. The items listed above may not be a complete list of foods and beverages you should avoid. Contact a dietitian for more information. Questions to ask a health care provider Do I need to  meet with a certified diabetes care and education specialist? Do I need to meet with a dietitian? What number can I call if I have questions? When are the best times to check my blood glucose? Where to find more information: American Diabetes Association: diabetes.org Academy of Nutrition and Dietetics: eatright.[employer] of Diabetes and Digestive and Kidney Diseases: [employer] Association of Diabetes Care & Education Specialists: diabeteseducator.org Summary It is important to have healthy eating habits because your blood sugar (glucose) levels are greatly affected by what you eat and drink. It is important to use alcohol carefully. A healthy meal plan will help you manage your blood glucose and lower your risk of heart disease. Your health care provider may recommend that you work with a dietitian to make a meal plan that is best for you. This information is not intended to replace advice given to you by your health care provider. Make sure you discuss any questions you have with your health care provider. Document Revised: 06/08/2020 Document Reviewed: 06/08/2020 Elsevier Patient Education  [place]. Urinary Tract Infection, Adult A urinary tract infection (UTI) is an infection of any part of the urinary tract. The urinary tract includes the kidneys, ureters, bladder, and urethra. These organs make, store, and get rid of  urine in the body. An upper UTI affects the ureters and kidneys. A lower UTI affects the bladder and urethra. What are the causes? Most urinary tract infections are caused by bacteria in your genital area around your urethra, where urine leaves your body. These bacteria grow and cause inflammation of your urinary tract. What increases the risk? You are more likely to develop this condition if: You have a urinary catheter that stays in place. You are not able to control when you urinate or have a bowel movement (incontinence). You are female and  you: Use a spermicide or diaphragm for birth control. Have low estrogen levels. Are pregnant. You have certain genes that increase your risk. You are sexually active. You take antibiotic medicines. You have a condition that causes your flow of urine to slow down, such as: An enlarged prostate, if you are female. Blockage in your urethra. A kidney stone. A nerve condition that affects your bladder control (neurogenic bladder). Not getting enough to drink, or not urinating often. You have certain medical conditions, such as: Diabetes. A weak disease-fighting system (immunesystem). Sickle cell disease. Gout. Spinal cord injury. What are the signs or symptoms? Symptoms of this condition include: Needing to urinate right away (urgency). Frequent urination. This may include small amounts of urine each time you urinate. Pain or burning with urination. Blood in the urine. Urine that smells bad or unusual. Trouble urinating. Cloudy urine. Vaginal discharge, if you are female. Pain in the abdomen or the lower back. You may also have: Vomiting or a decreased appetite. Confusion. Irritability or tiredness. A fever or chills. Diarrhea. The first symptom in older adults may be confusion. In some cases, they may not have any symptoms until the infection has worsened. How is this diagnosed? This condition is diagnosed based on your medical history and a physical exam. You may also have other tests, including: Urine tests. Blood tests. Tests for STIs (sexually transmitted infections). If you have had more than one UTI, a cystoscopy or imaging studies may be done to determine the cause of the infections. How is this treated? Treatment for this condition includes: Antibiotic medicine. Over-the-counter medicines to treat discomfort. Drinking enough water to stay hydrated. If you have frequent infections or have other conditions such as a kidney stone, you may need to see a health care  provider who specializes in the urinary tract (urologist). In rare cases, urinary tract infections can cause sepsis. Sepsis is a life-threatening condition that occurs when the body responds to an infection. Sepsis is treated in the hospital with IV antibiotics, fluids, and other medicines. Follow these instructions at home: Medicines Take over-the-counter and prescription medicines only as told by your health care provider. If you were prescribed an antibiotic medicine, take it as told by your health care provider. Do not stop using the antibiotic even if you start to feel better. General instructions Make sure you: Empty your bladder often and completely. Do not hold urine for long periods of time. Empty your bladder after sex. Wipe from front to back after urinating or having a bowel movement if you are female. Use each tissue only one time when you wipe. Drink enough fluid to keep your urine pale yellow. Keep all follow-up visits. This is important. Contact a health care provider if: Your symptoms do not get better after 1-2 days. Your symptoms go away and then return. Get help right away if: You have severe pain in your back or your lower abdomen. You have a  fever or chills. You have nausea or vomiting. Summary A urinary tract infection (UTI) is an infection of any part of the urinary tract, which includes the kidneys, ureters, bladder, and urethra. Most urinary tract infections are caused by bacteria in your genital area. Treatment for this condition often includes antibiotic medicines. If you were prescribed an antibiotic medicine, take it as told by your health care provider. Do not stop using the antibiotic even if you start to feel better. Keep all follow-up visits. This is important. This information is not intended to replace advice given to you by your health care provider. Make sure you discuss any questions you have with your health care provider. Document Revised:  06/17/2020 Document Reviewed: 06/17/2020 Elsevier Patient Education  [place].

## 2021-09-22 NOTE — Progress Notes (Signed)
[address], [place]  [address] Phone:  [phone]   Fax:  [phone]    Established Patient Office Visit  Subjective:  Patient ID: Beth Hubbard, female    DOB: Mar 19, 1946  Age: 75 y.o. MRN: [id number]  CC:  Chief Complaint  Patient presents with   Follow-up    6 month follow up, low back pain 2 weeks.     HPI Beth Hubbard presents for follow up. He  has a past medical history of Arthritis, Diabetes mellitus without complication ([place]), Excessive daytime sleepiness, Glaucoma, High cholesterol, Hypertension, Mitral regurgitation, OSA (obstructive sleep apnea) (10/18/2015), Pneumonia, Shingles, and TIA (transient ischemic attack).   She is follow up for her diabetes. She reports that her CBG varies. She is prescribed Januvia 50 mg daily. She is taking Januvia 50 mg every other day.  This is reflective of her current A1c 7.9% which is up from 6.8%. She reports some numbness and tingling that is associated with muscle spasms that comes and goes. She has had an 8 pound weight gain.   She is having some lower back pain. She denies any recent falls. She did bring in a urine sample due to inability to void in the office.   She is following up with opthalmology closely. She is status post surgical intervention to the right eye. She is using eye drops for the left eye only as recommended.   She has SOB on exertion. Denies headache, dizziness, chest pain, nausea, vomiting or any edema.   She has insomnia on trazodone 50 mg qhs. She reports that some times it takes her 3-4 hours to fall asleep.  She feels like this is getting worse.  She has obstructive sleep apnea uses her CPAP which indicates that she sleeps about 4 hours. She would like to have her trazodone increased for a better nights sleep.   Past Medical History:  Diagnosis Date   Arthritis    Diabetes mellitus without complication (HCC)    Excessive daytime sleepiness    Glaucoma     High cholesterol    Hypertension    Mitral regurgitation    mild by echo 03/2017   OSA (obstructive sleep apnea) 10/18/2015   Mild OSA with AHI 12.5/hr on CPAP at 8cm H2O   Pneumonia    Shingles    TIA (transient ischemic attack)     Past Surgical History:  Procedure Laterality Date   ABDOMINAL SURGERY     EYE SURGERY Bilateral    cataract surgery   ROTATOR CUFF REPAIR     bilateral   TEE WITHOUT CARDIOVERSION N/A 07/20/2016   Procedure: TRANSESOPHAGEAL ECHOCARDIOGRAM (TEE);  Surgeon: Pixie Casino, MD;  Location: [facility];  Service: Cardiovascular;  Laterality: N/A;   TOOTH EXTRACTION Left 10/09/2019   Procedure: DENTAL EXTRACTION X1 and Irrigation and Debridement.;  Surgeon: Diona Browner, DDS;  Location: [facility];  Service: Oral Surgery;  Laterality: Left;  DENTAL EXTRACTION X1 and Irrigation and Debridement.    Family History  Problem Relation Age of Onset   Cancer Father    Cancer Sister    Cancer Brother    Breast cancer Neg Hx     Social History   Socioeconomic History   Marital status: Legally Separated    Spouse name: Not on file   Number of children: Not on file   Years of education: Not on file   Highest education level: Not on file  Occupational  History   Not on file  Tobacco Use   Smoking status: Never   Smokeless tobacco: Never  Vaping Use   Vaping Use: Never used  Substance and Sexual Activity   Alcohol use: No   Drug use: No   Sexual activity: Not Currently  Other Topics Concern   Not on file  Social History Narrative   Not on file   Social Determinants of Health   Financial Resource Strain: Low Risk    Difficulty of Paying Living Expenses: Not hard at all  Food Insecurity: Food Insecurity Present   Worried About Charity fundraiser in the Last Year: Never true   Ran Out of Food in the Last Year: Sometimes true  Transportation Needs: No Transportation Needs   Lack of Transportation (Medical): No   Lack of Transportation (Non-Medical):  No  Physical Activity: Sufficiently Active   Days of Exercise per Week: 7 days   Minutes of Exercise per Session: 150+ min  Stress: No Stress Concern Present   Feeling of Stress : Only a little  Social Connections: Moderately Integrated   Frequency of Communication with Friends and Family: More than three times a week   Frequency of Social Gatherings with Friends and Family: Once a week   Attends Religious Services: More than 4 times per year   Active Member of [employer] or Organizations: Yes   Attends Music therapist: More than 4 times per year   Marital Status: Separated  Intimate Partner Violence: Not At Risk   Fear of Current or Ex-Partner: No   Emotionally Abused: No   Physically Abused: No   Sexually Abused: No    Outpatient Medications Prior to Visit  Medication Sig Dispense Refill   ACCU-CHEK AVIVA PLUS test strip Reported on 02/24/2016 100 each 1   Accu-Chek Softclix Lancets lancets Check BS once or twice a day. 100 each 1   amLODipine (NORVASC) 5 MG tablet TAKE 1 TABLET(5 MG) BY MOUTH DAILY 90 tablet 3   Blood Glucose Monitoring Suppl (ACCU-CHEK AVIVA PLUS) w/Device KIT 1 kit by Does not apply route as directed. Check 1 to 2 times daily 1 kit 0   Blood Pressure Monitor KIT 1 kit by Does not apply route daily. 1 kit 0   brimonidine (ALPHAGAN) 0.2 % ophthalmic solution Place 1 drop into the left eye 2 (two) times daily.     COMBIGAN 0.2-0.5 % ophthalmic solution Place 1 drop into both eyes 2 (two) times daily.     cyclobenzaprine (FLEXERIL) 10 MG tablet TAKE 1 TABLET(10 MG) BY MOUTH THREE TIMES DAILY AS NEEDED FOR MUSCLE SPASMS 30 tablet 2   cycloSPORINE (RESTASIS) 0.05 % ophthalmic emulsion Place 1 drop into both eyes 2 (two) times daily.     dorzolamide (TRUSOPT) 2 % ophthalmic solution Place 1 drop into the left eye 2 (two) times daily.     ferrous sulfate 325 (65 FE) MG tablet Take 325 mg by mouth daily.      predniSONE (DELTASONE) 20 MG tablet Take 20 mg by mouth  daily.     ROCKLATAN 0.02-0.005 % SOLN Place 1 drop into both eyes at bedtime.     rosuvastatin (CRESTOR) 20 MG tablet TAKE 1 TABLET(20 MG) BY MOUTH DAILY 90 tablet 3   gabapentin (NEURONTIN) 300 MG capsule TAKE 1 CAPSULE BY MOUTH EVERY NIGHT AT BEDTIME 90 capsule 3   HYDROcodone-acetaminophen (NORCO) 10-325 MG tablet Take 1 tablet by mouth every 8 (eight) hours as needed. 30 tablet  prednisoLONE acetate (PRED FORTE) 1 % ophthalmic suspension Place 1 drop into the right eye in the morning and at bedtime.     sitaGLIPtin (JANUVIA) 50 MG tablet TAKE 1 TABLET(50 MG) BY MOUTH DAILY 90 tablet 3   traZODone (DESYREL) 50 MG tablet TAKE 1 TABLET BY MOUTH AT BEDTIME AS NEEDED FOR SLEEP 90 tablet 3   atropine 1 % ophthalmic solution SMARTSIG:1 Drop(s) Right Eye     ofloxacin (OCUFLOX) 0.3 % ophthalmic solution Place 1 drop into the right eye 4 (four) times daily.     No facility-administered medications prior to visit.    Allergies  Allergen Reactions   Aspirin     Tremor    Penicillins Itching and Other (See Comments)    Reaction: itching,headache, dizziness and anxiety. Feels like she is "goin off" Did it involve swelling of the face/tongue/throat, SOB, or low BP? No Did it involve sudden or severe rash/hives, skin peeling, or any reaction on the inside of your mouth or nose? No Did you need to seek medical attention at a hospital or doctor's office? No When did it last happen?      Childhood If all above answers are "NO", may proceed with cephalosporin use.   Nsaids Anxiety    Reaction: causes dizziness and headache. Ibuprofen. Aspirin    ROS Review of Systems    Objective:    Physical Exam HENT:     Head: Normocephalic and atraumatic.     Nose: Nose normal.     Mouth/Throat:     Mouth: Mucous membranes are moist.  Cardiovascular:     Rate and Rhythm: Normal rate and regular rhythm.     Pulses: Normal pulses.     Heart sounds: Normal heart sounds.  Pulmonary:     Effort:  Pulmonary effort is normal.     Breath sounds: Normal breath sounds.     Comments: Shortness of breat with removing and putting on boots Abdominal:     General: Bowel sounds are normal.     Palpations: Abdomen is soft.     Comments: Increased abdominal girth   Musculoskeletal:        General: Normal range of motion.     Right lower leg: No edema.     Left lower leg: No edema.     Comments: Varicose vein in the right foot  Skin:    General: Skin is warm and dry.     Capillary Refill: Capillary refill takes less than 2 seconds.  Neurological:     General: No focal deficit present.     Mental Status: She is alert and oriented to person, place, and time.  Psychiatric:        Mood and Affect: Mood normal.        Behavior: Behavior normal.        Thought Content: Thought content normal.        Judgment: Judgment normal.    BP (!) 147/62 (BP Location: Left Arm, Patient Position: Sitting)   Pulse 95   Temp 97.9 F (36.6 C)   Ht 5' 4"  (1.626 m)   Wt 179 lb 0.6 oz (81.2 kg)   SpO2 99%   BMI 30.73 kg/m  Wt Readings from Last 3 Encounters:  09/22/21 179 lb 0.6 oz (81.2 kg)  04/11/21 171 lb (77.6 kg)  03/24/21 169 lb 9.6 oz (76.9 kg)     Health Maintenance Due  Topic Date Due   URINE MICROALBUMIN  06/24/2021  There are no preventive care reminders to display for this patient.  Lab Results  Component Value Date   TSH 1.27 03/05/2016   Lab Results  Component Value Date   WBC 6.2 03/24/2021   HGB 11.2 03/24/2021   HCT 35.0 03/24/2021   MCV 98 (H) 03/24/2021   PLT 208 03/24/2021   Lab Results  Component Value Date   NA 143 03/24/2021   K 4.0 03/24/2021   CO2 29 02/27/2020   GLUCOSE 93 03/24/2021   BUN 14 03/24/2021   CREATININE 0.89 03/24/2021   BILITOT <0.2 03/24/2021   ALKPHOS 62 03/24/2021   AST 17 03/24/2021   ALT 14 07/03/2019   PROT 7.2 03/24/2021   ALBUMIN 4.3 03/24/2021   CALCIUM 9.0 03/24/2021   ANIONGAP 13 02/27/2020   EGFR 68 03/24/2021   Lab  Results  Component Value Date   CHOL 234 (H) 03/24/2021   Lab Results  Component Value Date   HDL 73 03/24/2021   Lab Results  Component Value Date   LDLCALC 130 (H) 03/24/2021   Lab Results  Component Value Date   TRIG 178 (H) 03/24/2021   Lab Results  Component Value Date   CHOLHDL 3.2 03/24/2021   Lab Results  Component Value Date   HGBA1C 7.9 (A) 09/22/2021   HGBA1C 7.9 09/22/2021   HGBA1C 7.9 (A) 09/22/2021   HGBA1C 7.9 (A) 09/22/2021      Assessment & Plan:   Problem List Items Addressed This Visit       Endocrine   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[place]) - Primary Goal but elevated from 6.3 to 7.9% Encourage compliance with current treatment regimen daily Encourage regular CBG monitoring Encourage contacting office if excessive hyperglycemia and or hypoglycemia Lifestyle modification with healthy diet (fewer calories, more high fiber foods, whole grains and non-starchy vegetables, lower fat meat and fish, low-fat diary include healthy oils) regular exercise (physical activity) and weight loss Nutritional consult recommended Home BP monitoring also encouraged goal <130/80    Relevant Medications   sitaGLIPtin (JANUVIA) 50 MG tablet   Other Relevant Orders   POCT URINALYSIS DIP (CLINITEK) (Completed)   HgB A1c (Completed)   Comp. Metabolic Panel (12)   Microalbumin, urine   Other Visit Diagnoses     Essential hypertension    Stable Encouraged on going compliance with current medication regimen Encouraged home monitoring and recording BP <130/80 Eating a heart-healthy diet with less salt Encouraged regular physical activity  Recommend Weight loss      Relevant Orders   POCT URINALYSIS DIP (CLINITEK) (Completed)   Comp. Metabolic Panel (12)   Cervicalgia     Stable   Relevant Medications   gabapentin (NEURONTIN) 300 MG capsule   Hyperlipidemia, unspecified hyperlipidemia type   Persistent  Continue with current  regimen.   Heart healthy diet      Relevant Orders   Lipid panel   Urine white blood cells increased       Relevant Orders   Urine Culture   Insomnia, unspecified type     Persistent  Increased Trazadone 100 mg We discussed at length sleep hygiene measures including regular sleep schedule, optimal sleep environment, and relaxing presleep rituals. Avoid daytime naps. Avoid caffeine after noon. Avoid excess alcohol. Avoid tobacco. Recommended daily exercise     OSA (obstructive sleep apnea)4 Persistent  Reports good compliance with CPAP actual report unknown    Meds ordered this encounter  Medications   gabapentin (NEURONTIN) 300 MG capsule  Sig: TAKE 1 CAPSULE BY MOUTH EVERY NIGHT AT BEDTIME    Dispense:  90 capsule    Refill:  3   traZODone (DESYREL) 100 MG tablet    Sig: Take 1 tablet (100 mg total) by mouth at bedtime.    Dispense:  90 tablet    Refill:  3    Order Specific Question:   Supervising Provider    Answer:   Tresa Garter [[id number]]   sitaGLIPtin (JANUVIA) 50 MG tablet    Sig: TAKE 1 TABLET(50 MG) BY MOUTH DAILY    Dispense:  90 tablet    Refill:  3    Order Specific Question:   Supervising Provider    Answer:   Tresa Garter [[id number]]   nitrofurantoin, macrocrystal-monohydrate, (MACROBID) 100 MG capsule    Sig: Take 1 capsule (100 mg total) by mouth 2 (two) times daily for 5 days.    Dispense:  10 capsule    Refill:  0    Order Specific Question:   Supervising Provider    Answer:   Tresa Garter [id number]    Follow-up: Return in about 6 months (around 03/22/2022) for follow up [id number].    Vevelyn Francois, NP

## 2021-09-23 LAB — MICROALBUMIN, URINE: Microalbumin, Urine: 8.3 ug/mL

## 2021-09-23 LAB — COMP. METABOLIC PANEL (12)
AST: 20 IU/L (ref 0–40)
Albumin/Globulin Ratio: 1.9 (ref 1.2–2.2)
Albumin: 4.2 g/dL (ref 3.7–4.7)
Alkaline Phosphatase: 56 IU/L (ref 44–121)
BUN/Creatinine Ratio: 21 (ref 12–28)
BUN: 19 mg/dL (ref 8–27)
Bilirubin Total: 0.3 mg/dL (ref 0.0–1.2)
Calcium: 9.3 mg/dL (ref 8.7–10.3)
Chloride: 102 mmol/L (ref 96–106)
Creatinine, Ser: 0.91 mg/dL (ref 0.57–1.00)
Globulin, Total: 2.2 g/dL (ref 1.5–4.5)
Glucose: 181 mg/dL — ABNORMAL HIGH (ref 70–99)
Potassium: 3.8 mmol/L (ref 3.5–5.2)
Sodium: 143 mmol/L (ref 134–144)
Total Protein: 6.4 g/dL (ref 6.0–8.5)
eGFR: 65 mL/min/{1.73_m2} (ref >59)

## 2021-09-23 LAB — LIPID PANEL
Chol/HDL Ratio: 2.5 ratio (ref 0.0–4.4)
Cholesterol, Total: 213 mg/dL — ABNORMAL HIGH (ref 100–199)
HDL: 85 mg/dL (ref >39)
LDL Chol Calc (NIH): 100 mg/dL — ABNORMAL HIGH (ref 0–99)
Triglycerides: 166 mg/dL — ABNORMAL HIGH (ref 0–149)
VLDL Cholesterol Cal: 28 mg/dL (ref 5–40)

## 2021-09-27 LAB — URINE CULTURE

## 2021-09-29 DIAGNOSIS — M5412 Radiculopathy, cervical region: Secondary | ICD-10-CM | POA: Diagnosis not present

## 2021-09-29 DIAGNOSIS — M4726 Other spondylosis with radiculopathy, lumbar region: Secondary | ICD-10-CM | POA: Diagnosis not present

## 2021-09-29 DIAGNOSIS — I10 Essential (primary) hypertension: Secondary | ICD-10-CM | POA: Diagnosis not present

## 2021-09-29 DIAGNOSIS — M25512 Pain in left shoulder: Secondary | ICD-10-CM | POA: Diagnosis not present

## 2021-10-31 ENCOUNTER — Other Ambulatory Visit: Payer: Self-pay

## 2021-10-31 ENCOUNTER — Telehealth (INDEPENDENT_AMBULATORY_CARE_PROVIDER_SITE_OTHER): Payer: Medicare Other | Admitting: Nurse Practitioner

## 2021-10-31 ENCOUNTER — Encounter: Payer: Self-pay | Admitting: Nurse Practitioner

## 2021-10-31 DIAGNOSIS — J069 Acute upper respiratory infection, unspecified: Secondary | ICD-10-CM

## 2021-10-31 MED ORDER — ONDANSETRON 4 MG PO TBDP: 4.0000 mg | ORAL_TABLET | Freq: Three times a day (TID) | ORAL | 0 refills | Status: DC | PRN

## 2021-10-31 MED ORDER — DOXYCYCLINE HYCLATE 100 MG PO TABS: 100.0000 mg | ORAL_TABLET | Freq: Two times a day (BID) | ORAL | 0 refills | Status: AC

## 2021-10-31 MED ORDER — PREDNISONE 10 MG PO TABS: 10.0000 mg | ORAL_TABLET | Freq: Every day | ORAL | 0 refills | Status: AC

## 2021-10-31 NOTE — Progress Notes (Signed)
Virtual Visit via Telephone Note  I connected with Beth Hubbard on 10/31/21 at  1:40 PM EST by telephone and verified that I am speaking with the correct person using two identifiers.  Location: Patient: home Provider: office   I discussed the limitations, risks, security and privacy concerns of performing an evaluation and management service by telephone and the availability of in person appointments. I also discussed with the patient that there may be a patient responsible charge related to this service. The patient expressed understanding and agreed to proceed.   History of Present Illness:  Patient presents today for sick visit through telephone visit. She states that for the past 7 days she has been experiencing cough, sneezing, headaches, and low grade fever. She does get nauseated at times as well. Denies f/c/s, n/v/d, hemoptysis, PND, chest pain or edema.   Observations/Objective:  Vitals with BMI 09/22/2021 05/01/2021 04/11/2021  Height 5\' 4"  - 5\' 4"   Weight 179 lbs 1 oz - 171 lbs  BMI 16.10 - 96.04  Systolic 540 981 191  Diastolic 62 63 60  Pulse 95 89 90      Assessment and Plan:  URI:  Stay well hydrated  Stay active  Deep breathing exercises  May take tylenol for fever or pain  Will order doxycycline  Will order low dose prednisone  Will order low dose zofran for nausea   Follow up:  Follow up if needed    I discussed the assessment and treatment plan with the patient. The patient was provided an opportunity to ask questions and all were answered. The patient agreed with the plan and demonstrated an understanding of the instructions.   The patient was advised to call back or seek an in-person evaluation if the symptoms worsen or if the condition fails to improve as anticipated.  I provided 23 minutes of non-face-to-face time during this encounter.   Fenton Foy, NP

## 2021-10-31 NOTE — Patient Instructions (Addendum)
URI:  Stay well hydrated  Stay active  Deep breathing exercises  May take tylenol for fever or pain  Will order doxycycline  Will order low dose prednisone  Will order low dose zofran for nausea   Follow up:  Follow up if needed

## 2021-11-24 DIAGNOSIS — H401112 Primary open-angle glaucoma, right eye, moderate stage: Secondary | ICD-10-CM | POA: Diagnosis not present

## 2021-11-24 DIAGNOSIS — H401121 Primary open-angle glaucoma, left eye, mild stage: Secondary | ICD-10-CM | POA: Diagnosis not present

## 2021-12-08 DIAGNOSIS — H401121 Primary open-angle glaucoma, left eye, mild stage: Secondary | ICD-10-CM | POA: Diagnosis not present

## 2021-12-08 DIAGNOSIS — H401112 Primary open-angle glaucoma, right eye, moderate stage: Secondary | ICD-10-CM | POA: Diagnosis not present

## 2021-12-22 DIAGNOSIS — M5412 Radiculopathy, cervical region: Secondary | ICD-10-CM | POA: Diagnosis not present

## 2021-12-22 DIAGNOSIS — M25561 Pain in right knee: Secondary | ICD-10-CM | POA: Diagnosis not present

## 2021-12-22 DIAGNOSIS — M79632 Pain in left forearm: Secondary | ICD-10-CM | POA: Diagnosis not present

## 2021-12-22 DIAGNOSIS — M25512 Pain in left shoulder: Secondary | ICD-10-CM | POA: Diagnosis not present

## 2021-12-25 ENCOUNTER — Encounter (HOSPITAL_COMMUNITY): Payer: Self-pay

## 2021-12-25 ENCOUNTER — Other Ambulatory Visit: Payer: Self-pay

## 2021-12-25 ENCOUNTER — Ambulatory Visit (HOSPITAL_COMMUNITY)
Admission: EM | Admit: 2021-12-25 | Discharge: 2021-12-25 | Disposition: A | Discharge status: Home / Self Care | Payer: Medicare Other | Attending: Family Medicine | Admitting: Family Medicine

## 2021-12-25 DIAGNOSIS — K0889 Other specified disorders of teeth and supporting structures: Secondary | ICD-10-CM

## 2021-12-25 MED ORDER — CLINDAMYCIN HCL 300 MG PO CAPS: 300.0000 mg | ORAL_CAPSULE | Freq: Three times a day (TID) | ORAL | 0 refills | Status: AC

## 2021-12-25 NOTE — ED Triage Notes (Signed)
Pt presents with dental pain x 1 week.

## 2021-12-25 NOTE — Discharge Instructions (Addendum)
Take clindamycin 300 mg, 1 capsule 3 times daily for 5 days.  You can ice the area to help with the pain.  Your hydrocodone has some Tylenol in it, but she can take 1 extra strength Tylenol tablet with your hydrocodone dose to help with your pain relief

## 2021-12-25 NOTE — ED Provider Notes (Signed)
[facility]    CSN: [id number] Arrival date & time: 12/25/21  1934      History   Chief Complaint Chief Complaint  Patient presents with   Dental Pain    HPI Beth Hubbard is a 76 y.o. female.    Dental Pain Here for pain in her anterior lower gums and in her lip for the last 2 or 3 days.  The area began swelling 3 days ago.  And the pain got worse today.  She is allergic to penicillin which causes itching and she is allergic to NSAIDs.  She also takes hydrocodone usually 3 times daily.  No fever or chills.  She does not have any itching in her lips nor does she have any tongue swelling  Past medical history significant for diabetes and hypertension  Past Medical History:  Diagnosis Date   Arthritis    Diabetes mellitus without complication (HCC)    Excessive daytime sleepiness    Glaucoma    High cholesterol    Hypertension    Mitral regurgitation    mild by echo 03/2017   OSA (obstructive sleep apnea) 10/18/2015   Mild OSA with AHI 12.5/hr on CPAP at 8cm H2O   Pneumonia    Shingles    TIA (transient ischemic attack)     Patient Active Problem List   Diagnosis Date Noted   Pulmonary HTN ([place]) 09/23/2020   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[place]) 06/29/2020   OSA (obstructive sleep apnea) 10/18/2015   Mitral regurgitation 05/18/2015   Excessive daytime sleepiness 02/28/2015   TIA (transient ischemic attack)    Hypertension    Diabetes mellitus without complication (HCC)    High cholesterol     Past Surgical History:  Procedure Laterality Date   ABDOMINAL SURGERY     EYE SURGERY Bilateral    cataract surgery   ROTATOR CUFF REPAIR     bilateral   TEE WITHOUT CARDIOVERSION N/A 07/20/2016   Procedure: TRANSESOPHAGEAL ECHOCARDIOGRAM (TEE);  Surgeon: Pixie Casino, MD;  Location: [facility];  Service: Cardiovascular;  Laterality: N/A;   TOOTH EXTRACTION Left 10/09/2019   Procedure: DENTAL EXTRACTION X1 and Irrigation and  Debridement.;  Surgeon: Diona Browner, DDS;  Location: [facility];  Service: Oral Surgery;  Laterality: Left;  DENTAL EXTRACTION X1 and Irrigation and Debridement.    OB History   No obstetric history on file.      Home Medications    Prior to Admission medications   Medication Sig Start Date End Date Taking? Authorizing Provider  clindamycin (CLEOCIN) 300 MG capsule Take 1 capsule (300 mg total) by mouth 3 (three) times daily for 5 days. 12/25/21 12/30/21 Yes Barrett Henle, MD  ACCU-CHEK AVIVA PLUS test strip Reported on 02/24/2016 09/23/20   Vevelyn Francois, NP  Accu-Chek Softclix Lancets lancets Check BS once or twice a day. 09/23/20   Vevelyn Francois, NP  amLODipine (NORVASC) 5 MG tablet TAKE 1 TABLET(5 MG) BY MOUTH DAILY 04/11/21   Sueanne Margarita, MD  atropine 1 % ophthalmic solution SMARTSIG:1 Drop(s) Right Eye 07/12/21   [provider]  Blood Glucose Monitoring Suppl (ACCU-CHEK AVIVA PLUS) w/Device KIT 1 kit by Does not apply route as directed. Check 1 to 2 times daily 09/23/20   Vevelyn Francois, NP  Blood Pressure Monitor KIT 1 kit by Does not apply route daily. 03/24/21   Vevelyn Francois, NP  brimonidine (ALPHAGAN) 0.2 % ophthalmic solution Place 1 drop into  the left eye 2 (two) times daily.    [provider]  COMBIGAN 0.2-0.5 % ophthalmic solution Place 1 drop into both eyes 2 (two) times daily. 03/28/15   [provider]  cyclobenzaprine (FLEXERIL) 10 MG tablet TAKE 1 TABLET(10 MG) BY MOUTH THREE TIMES DAILY AS NEEDED FOR MUSCLE SPASMS 03/24/21   Vevelyn Francois, NP  cycloSPORINE (RESTASIS) 0.05 % ophthalmic emulsion Place 1 drop into both eyes 2 (two) times daily.    [provider]  dorzolamide (TRUSOPT) 2 % ophthalmic solution Place 1 drop into the left eye 2 (two) times daily. 07/10/19   [provider]  ferrous sulfate 325 (65 FE) MG tablet Take 325 mg by mouth daily.     [provider]  gabapentin (NEURONTIN) 300 MG capsule TAKE 1  CAPSULE BY MOUTH EVERY NIGHT AT BEDTIME 09/22/21   Vevelyn Francois, NP  ofloxacin (OCUFLOX) 0.3 % ophthalmic solution Place 1 drop into the right eye 4 (four) times daily. 05/30/21   [provider]  ondansetron (ZOFRAN-ODT) 4 MG disintegrating tablet Take 1 tablet (4 mg total) by mouth every 8 (eight) hours as needed for nausea or vomiting. 10/31/21   Fenton Foy, NP  ROCKLATAN 0.02-0.005 % SOLN Place 1 drop into both eyes at bedtime. 09/07/19   [provider]  rosuvastatin (CRESTOR) 20 MG tablet TAKE 1 TABLET(20 MG) BY MOUTH DAILY 03/01/21   Vevelyn Francois, NP  sitaGLIPtin (JANUVIA) 50 MG tablet TAKE 1 TABLET(50 MG) BY MOUTH DAILY 09/22/21   Vevelyn Francois, NP  traZODone (DESYREL) 100 MG tablet Take 1 tablet (100 mg total) by mouth at bedtime. 09/22/21 09/22/22  Vevelyn Francois, NP    Family History Family History  Problem Relation Age of Onset   Cancer Father    Cancer Sister    Cancer Brother    Breast cancer Neg Hx     Social History Social History   Tobacco Use   Smoking status: Never   Smokeless tobacco: Never  Vaping Use   Vaping Use: Never used  Substance Use Topics   Alcohol use: No   Drug use: No     Allergies   Aspirin, Penicillins, and Nsaids   Review of Systems Review of Systems   Physical Exam Triage Vital Signs ED Triage Vitals  Enc Vitals Group     BP 12/25/21 2027 (!) 184/76     Pulse Rate 12/25/21 2025 (!) 101     Resp 12/25/21 2025 19     Temp 12/25/21 2027 97.9 F (36.6 C)     Temp Source 12/25/21 2025 Oral     SpO2 12/25/21 2025 97 %     Weight --      Height --      Head Circumference --      Peak Flow --      Pain Score --      Pain Loc --      Pain Edu? --      Excl. in [place]? --    No data found.  Updated Vital Signs BP (!) 184/76 (BP Location: Right Arm)    Pulse (!) 101    Temp 97.9 F (36.6 C) (Oral)    Resp 19    SpO2 97%   Visual Acuity Right Eye Distance:   Left Eye Distance:   Bilateral Distance:     Right Eye Near:   Left Eye Near:    Bilateral Near:  Physical Exam Vitals reviewed.  Constitutional:      General: She is not in acute distress.    Appearance: She is not ill-appearing, toxic-appearing or diaphoretic.  HENT:     Mouth/Throat:     Mouth: Mucous membranes are moist.     Comments: She has some swelling of her lower anterior dental ridge and of the lower lip. No swelling of upper lip. No erythema. Dental caries evident Cardiovascular:     Rate and Rhythm: Normal rate and regular rhythm.  Pulmonary:     Effort: Pulmonary effort is normal.     Breath sounds: Normal breath sounds.  Skin:    Capillary Refill: Capillary refill takes less than 2 seconds.     Coloration: Skin is not jaundiced or pale.  Neurological:     Mental Status: She is alert and oriented to person, place, and time.  Psychiatric:        Behavior: Behavior normal.     UC Treatments / Results  Labs (all labs ordered are listed, but only abnormal results are displayed) Labs Reviewed - No data to display  EKG   Radiology No results found.  Procedures Procedures (including critical care time)  Medications Ordered in UC Medications - No data to display  Initial Impression / Assessment and Plan / UC Course  I have reviewed the triage vital signs and the nursing notes.  Pertinent labs & imaging results that were available during my care of the patient were reviewed by me and considered in my medical decision making (see chart for details).    Will treat with clindamycin.  Discussed that she is already taking a stout dose of hydrocodone; she can add an extra strength Tylenol onto each hydrocodone dose.  Discussed that hydrocodone does also have Tylenol in its formulation.  She has an appointment with the dentist for next week.  Discussed going to the ER for possible IV treatment if the swelling is worsening Final Clinical Impressions(s) / UC Diagnoses   Final diagnoses:  Pain, dental      Discharge Instructions      Take clindamycin 300 mg, 1 capsule 3 times daily for 5 days.  You can ice the area to help with the pain.  Your hydrocodone has some Tylenol in it, but she can take 1 extra strength Tylenol tablet with your hydrocodone dose to help with your pain relief     ED Prescriptions     Medication Sig Dispense Auth. Provider   clindamycin (CLEOCIN) 300 MG capsule Take 1 capsule (300 mg total) by mouth 3 (three) times daily for 5 days. 15 capsule Rayhan Groleau, Gwenlyn Perking, MD      I have reviewed the PDMP during this encounter.   Barrett Henle, MD 12/25/21 2045

## 2022-01-15 ENCOUNTER — Other Ambulatory Visit: Payer: Self-pay | Admitting: Cardiology

## 2022-01-15 DIAGNOSIS — I10 Essential (primary) hypertension: Secondary | ICD-10-CM

## 2022-01-16 ENCOUNTER — Telehealth: Payer: Self-pay

## 2022-01-16 ENCOUNTER — Other Ambulatory Visit: Payer: Self-pay | Admitting: Nurse Practitioner

## 2022-01-16 DIAGNOSIS — E785 Hyperlipidemia, unspecified: Secondary | ICD-10-CM

## 2022-01-16 NOTE — Telephone Encounter (Signed)
Amlodipine.

## 2022-01-26 ENCOUNTER — Other Ambulatory Visit: Payer: Self-pay

## 2022-01-26 ENCOUNTER — Encounter: Payer: Self-pay | Admitting: Nurse Practitioner

## 2022-01-26 ENCOUNTER — Ambulatory Visit (INDEPENDENT_AMBULATORY_CARE_PROVIDER_SITE_OTHER): Payer: Medicare Other | Admitting: Nurse Practitioner

## 2022-01-26 VITALS — BP 157/74 | HR 70 | Temp 98.0°F | Ht 64.0 in | Wt 154.4 lb

## 2022-01-26 DIAGNOSIS — E1129 Type 2 diabetes mellitus with other diabetic kidney complication: Secondary | ICD-10-CM

## 2022-01-26 DIAGNOSIS — E785 Hyperlipidemia, unspecified: Secondary | ICD-10-CM

## 2022-01-26 DIAGNOSIS — G4700 Insomnia, unspecified: Secondary | ICD-10-CM | POA: Diagnosis not present

## 2022-01-26 DIAGNOSIS — I10 Essential (primary) hypertension: Secondary | ICD-10-CM

## 2022-01-26 DIAGNOSIS — R809 Proteinuria, unspecified: Secondary | ICD-10-CM | POA: Diagnosis not present

## 2022-01-26 LAB — POCT GLYCOSYLATED HEMOGLOBIN (HGB A1C)
HbA1c POC (<> result, manual entry): 6.6 % (ref 4.0–5.6)
HbA1c, POC (controlled diabetic range): 6.6 % (ref 0.0–7.0)
HbA1c, POC (prediabetic range): 6.6 % — AB (ref 5.7–6.4)
Hemoglobin A1C: 6.6 % — AB (ref 4.0–5.6)

## 2022-01-26 MED ORDER — AMITRIPTYLINE HCL 75 MG PO TABS: ORAL_TABLET | ORAL | 0 refills | Status: DC

## 2022-01-26 NOTE — Patient Instructions (Addendum)
[name] ?This is your after visit summary for today.  ?1. Controlled type 2 diabetes mellitus with microalbuminuria, without long-term current use of insulin (HCC) ?- POCT glycosylated hemoglobin (Hb A1C) ?- Comp. Metabolic Panel (12) ?Stable/Controlled  ?Encourage compliance with current treatment regimen  Encourage regular CBG monitoring ?Encourage contacting office if excessive hyperglycemia and or hypoglycemia ?Lifestyle modification with healthy diet (fewer calories, more high fiber foods, whole grains and non-starchy vegetables, lower fat meat and fish, low-fat diary include healthy oils) regular exercise (physical activity) and weight loss ?Opthalmology exam discussed  ?Nutritional consult recommended ?Regular dental visits encouraged ?Home BP monitoring also encouraged goal <130/80 ? ?2. Essential hypertension ?Encouraged on going compliance with current medication regimen ?Encouraged home monitoring and recording BP <130/80 ?Eating a heart-healthy diet with less salt ?Encouraged regular physical activity  ?Recommend Weight loss ? ? ?3. Hyperlipidemia, unspecified hyperlipidemia type ?- Lipid panel ?Stable  ?Encouraged on going compliance with current medication regimen. I recommend eating a heart-healthy diet; low in fat and cholesterol along with regular exercise. This will promote weight loss and improve cholesterol.  ? ?4. Insomnia, unspecified type ? Discontinue Trazodone   ?Start Amitriptyline 75 mg x 7 days  ?May increase to amitriptyline 150 mg qhs if not effective. ? ?Return in about 6 weeks (around 03/09/2022) for insomina medication change 99213.  ?Please feel free to call our office, if you have any questions. ?Have a good day. ?Beth David NP ? ? ? ?Insomnia ?Insomnia is a sleep disorder that makes it difficult to fall asleep or stay asleep. Insomnia can cause fatigue, low energy, difficulty concentrating, mood swings, and poor performance at work or school. ?There are three different ways to  classify insomnia: ?Difficulty falling asleep. ?Difficulty staying asleep. ?Waking up too early in the morning. ?Any type of insomnia can be long-term (chronic) or short-term (acute). Both are common. Short-term insomnia usually lasts for three months or less. Chronic insomnia occurs at least three times a week for longer than three months. ?What are the causes? ?Insomnia may be caused by another condition, situation, or substance, such as: ?Anxiety. ?Certain medicines. ?Gastroesophageal reflux disease (GERD) or other gastrointestinal conditions. ?Asthma or other breathing conditions. ?Restless legs syndrome, sleep apnea, or other sleep disorders. ?Chronic pain. ?Menopause. ?Stroke. ?Abuse of alcohol, tobacco, or illegal drugs. ?Mental health conditions, such as depression. ?Caffeine. ?Neurological disorders, such as Alzheimer's disease. ?An overactive thyroid (hyperthyroidism). ?Sometimes, the cause of insomnia may not be known. ?What increases the risk? ?Risk factors for insomnia include: ?Gender. Women are affected more often than men. ?Age. Insomnia is more common as you get older. ?Stress. ?Lack of exercise. ?Irregular work schedule or working night shifts. ?Traveling between different time zones. ?Certain medical and mental health conditions. ?What are the signs or symptoms? ?If you have insomnia, the main symptom is having trouble falling asleep or having trouble staying asleep. This may lead to other symptoms, such as: ?Feeling fatigued or having low energy. ?Feeling nervous about going to sleep. ?Not feeling rested in the morning. ?Having trouble concentrating. ?Feeling irritable, anxious, or depressed. ?How is this diagnosed? ?This condition may be diagnosed based on: ?Your symptoms and medical history. Your health care provider may ask about: ?Your sleep habits. ?Any medical conditions you have. ?Your mental health. ?A physical exam. ?How is this treated? ?Treatment for insomnia depends on the cause.  Treatment may focus on treating an underlying condition that is causing insomnia. Treatment may also include: ?Medicines to help you sleep. ?Counseling or therapy. ?  Lifestyle adjustments to help you sleep better. ?Follow these instructions at home: ?Eating and drinking ? ?Limit or avoid alcohol, caffeinated beverages, and cigarettes, especially close to bedtime. These can disrupt your sleep. ?Do not eat a large meal or eat spicy foods right before bedtime. This can lead to digestive discomfort that can make it hard for you to sleep. ?Sleep habits ? ?Keep a sleep diary to help you and your health care provider figure out what could be causing your insomnia. Write down: ?When you sleep. ?When you wake up during the night. ?How well you sleep. ?How rested you feel the next day. ?Any side effects of medicines you are taking. ?What you eat and drink. ?Make your bedroom a dark, comfortable place where it is easy to fall asleep. ?Put up shades or blackout curtains to block light from outside. ?Use a white noise machine to block noise. ?Keep the temperature cool. ?Limit screen use before bedtime. This includes: ?Watching TV. ?Using your smartphone, tablet, or computer. ?Stick to a routine that includes going to bed and waking up at the same times every day and night. This can help you fall asleep faster. Consider making a quiet activity, such as reading, part of your nighttime routine. ?Try to avoid taking naps during the day so that you sleep better at night. ?Get out of bed if you are still awake after 15 minutes of trying to sleep. Keep the lights down, but try reading or doing a quiet activity. When you feel sleepy, go back to bed. ?General instructions ?Take over-the-counter and prescription medicines only as told by your health care provider. ?Exercise regularly, as told by your health care provider. Avoid exercise starting several hours before bedtime. ?Use relaxation techniques to manage stress. Ask your health care  provider to suggest some techniques that may work well for you. These may include: ?Breathing exercises. ?Routines to release muscle tension. ?Visualizing peaceful scenes. ?Make sure that you drive carefully. Avoid driving if you feel very sleepy. ?Keep all follow-up visits as told by your health care provider. This is important. ?Contact a health care provider if: ?You are tired throughout the day. ?You have trouble in your daily routine due to sleepiness. ?You continue to have sleep problems, or your sleep problems get worse. ?Get help right away if: ?You have serious thoughts about hurting yourself or someone else. ?If you ever feel like you may hurt yourself or others, or have thoughts about taking your own life, get help right away. You can go to your nearest emergency department or call: ?Your local emergency services (911 in the [place] ?A suicide crisis helpline, such as the [facility] at (762)732-5251 or 988 in the [place]. This is open 24 hours a day. ?Summary ?Insomnia is a sleep disorder that makes it difficult to fall asleep or stay asleep. ?Insomnia can be long-term (chronic) or short-term (acute). ?Treatment for insomnia depends on the cause. Treatment may focus on treating an underlying condition that is causing insomnia. ?Keep a sleep diary to help you and your health care provider figure out what could be causing your insomnia. ?This information is not intended to replace advice given to you by your health care provider. Make sure you discuss any questions you have with your health care provider. ?Document Revised: 05/31/2021 Document Reviewed: 09/15/2020 ?Elsevier Patient Education ? [place]. ? ?

## 2022-01-26 NOTE — Progress Notes (Incomplete)
[facility] [place], [place]  [address] Phone:  [phone]   Fax:  [phone]   Established Patient Office Visit  Subjective:  Patient ID: Beth Hubbard, female    DOB: January 25, 1946  Age: 76 y.o. MRN: [id number]  CC:  Chief Complaint  Patient presents with   Follow-up    Pt stated her BP has been going up and down also the trazodone is not working still have trouble sleeping. Pt stated she has no appetite     HPI Beth Hubbard presents for follow up. She  has a past medical history of Arthritis, Diabetes mellitus without complication ([place]), Excessive daytime sleepiness, Glaucoma, High cholesterol, Hypertension, Mitral regurgitation, OSA (obstructive sleep apnea) (10/18/2015), Pneumonia, Shingles, and TIA (transient ischemic attack).   She reports that the trazodone 100 mg daily. She as increased by one tablet and it still was not effective. She denies sleeping during the day. She is getting 4-5 hours of sleep.   Beth Hubbard is in today for diabetes follow up. The prescribed treatment is *** along with statin therapy ***. The reported use of treatment ***  consistent with prescribed. There *** reported side effects from the treatment. Home glucose monitoring indicates a CBG range of   100's  Denies fever, chills, headache, dizziness, visual changes, polydipsia,  polyphagia shortness of breath, dyspnea on exertion, chest pain, abdominal pain, nausea, vomiting, polyuria, constipation, diarrhea,  any edema, numbness, tingling, burning of hand or feet. There has been a professional eye exam in the year.      Past Medical History:  Diagnosis Date   Arthritis    Diabetes mellitus without complication (HCC)    Excessive daytime sleepiness    Glaucoma    High cholesterol    Hypertension    Mitral regurgitation    mild by echo 03/2017   OSA (obstructive sleep apnea) 10/18/2015   Mild OSA with AHI 12.5/hr on CPAP at 8cm H2O   Pneumonia     Shingles    TIA (transient ischemic attack)     Past Surgical History:  Procedure Laterality Date   ABDOMINAL SURGERY     EYE SURGERY Bilateral    cataract surgery   ROTATOR CUFF REPAIR     bilateral   TEE WITHOUT CARDIOVERSION N/A 07/20/2016   Procedure: TRANSESOPHAGEAL ECHOCARDIOGRAM (TEE);  Surgeon: Pixie Casino, MD;  Location: [facility];  Service: Cardiovascular;  Laterality: N/A;   TOOTH EXTRACTION Left 10/09/2019   Procedure: DENTAL EXTRACTION X1 and Irrigation and Debridement.;  Surgeon: Diona Browner, DDS;  Location: [facility];  Service: Oral Surgery;  Laterality: Left;  DENTAL EXTRACTION X1 and Irrigation and Debridement.    Family History  Problem Relation Age of Onset   Cancer Father    Cancer Sister    Cancer Brother    Breast cancer Neg Hx     Social History   Socioeconomic History   Marital status: Legally Separated    Spouse name: Not on file   Number of children: Not on file   Years of education: Not on file   Highest education level: Not on file  Occupational History   Not on file  Tobacco Use   Smoking status: Never   Smokeless tobacco: Never  Vaping Use   Vaping Use: Never used  Substance and Sexual Activity   Alcohol use: No   Drug use: No   Sexual activity: Not Currently  Other Topics Concern   Not on  file  Social History Narrative   Not on file   Social Determinants of Health   Financial Resource Strain: Low Risk    Difficulty of Paying Living Expenses: Not hard at all  Food Insecurity: Food Insecurity Present   Worried About Charity fundraiser in the Last Year: Never true   Arboriculturist in the Last Year: Sometimes true  Transportation Needs: No Transportation Needs   Lack of Transportation (Medical): No   Lack of Transportation (Non-Medical): No  Physical Activity: Sufficiently Active   Days of Exercise per Week: 7 days   Minutes of Exercise per Session: 150+ min  Stress: No Stress Concern Present    Feeling of Stress : Only a little  Social Connections: Moderately Integrated   Frequency of Communication with Friends and Family: More than three times a week   Frequency of Social Gatherings with Friends and Family: Once a week   Attends Religious Services: More than 4 times per year   Active Member of [employer] or Organizations: Yes   Attends Music therapist: More than 4 times per year   Marital Status: Separated  Intimate Partner Violence: Not At Risk   Fear of Current or Ex-Partner: No   Emotionally Abused: No   Physically Abused: No   Sexually Abused: No    Outpatient Medications Prior to Visit  Medication Sig Dispense Refill   ACCU-CHEK AVIVA PLUS test strip Reported on 02/24/2016 100 each 1   Accu-Chek Softclix Lancets lancets Check BS once or twice a day. 100 each 1   amLODipine (NORVASC) 5 MG tablet TAKE 1 TABLET(5 MG) BY MOUTH DAILY 90 tablet 0   atropine 1 % ophthalmic solution SMARTSIG:1 Drop(s) Right Eye     Blood Glucose Monitoring Suppl (ACCU-CHEK AVIVA PLUS) w/Device KIT 1 kit by Does not apply route as directed. Check 1 to 2 times daily 1 kit 0   Blood Pressure Monitor KIT 1 kit by Does not apply route daily. 1 kit 0   brimonidine (ALPHAGAN) 0.2 % ophthalmic solution Place 1 drop into the left eye 2 (two) times daily.     cycloSPORINE (RESTASIS) 0.05 % ophthalmic emulsion Place 1 drop into both eyes 2 (two) times daily.     dorzolamide (TRUSOPT) 2 % ophthalmic solution Place 1 drop into the left eye 2 (two) times daily.     ferrous sulfate 325 (65 FE) MG tablet Take 325 mg by mouth daily.      gabapentin (NEURONTIN) 300 MG capsule TAKE 1 CAPSULE BY MOUTH EVERY NIGHT AT BEDTIME 90 capsule 3   ofloxacin (OCUFLOX) 0.3 % ophthalmic solution Place 1 drop into the right eye 4 (four) times daily.     ondansetron (ZOFRAN-ODT) 4 MG disintegrating tablet Take 1 tablet (4 mg total) by mouth every 8 (eight) hours as needed for nausea or vomiting.  10 tablet 0   ROCKLATAN 0.02-0.005 % SOLN Place 1 drop into both eyes at bedtime.     rosuvastatin (CRESTOR) 20 MG tablet TAKE 1 TABLET(20 MG) BY MOUTH DAILY 90 tablet 0   sitaGLIPtin (JANUVIA) 50 MG tablet TAKE 1 TABLET(50 MG) BY MOUTH DAILY 90 tablet 3   traZODone (DESYREL) 100 MG tablet Take 1 tablet (100 mg total) by mouth at bedtime. 90 tablet 3   COMBIGAN 0.2-0.5 % ophthalmic solution Place 1 drop into both eyes 2 (two) times daily. (Patient not taking: Reported on 01/26/2022)     cyclobenzaprine (FLEXERIL) 10 MG tablet TAKE 1 TABLET(10  MG) BY MOUTH THREE TIMES DAILY AS NEEDED FOR MUSCLE SPASMS 30 tablet 2   clindamycin (CLEOCIN) 300 MG capsule Take 300 mg by mouth every 6 (six) hours.     No facility-administered medications prior to visit.    Allergies  Allergen Reactions   Aspirin     Tremor    Penicillins Itching and Other (See Comments)    Reaction: itching,headache, dizziness and anxiety. Feels like she is "goin off" Did it involve swelling of the face/tongue/throat, SOB, or low BP? No Did it involve sudden or severe rash/hives, skin peeling, or any reaction on the inside of your mouth or nose? No Did you need to seek medical attention at a hospital or doctor's office? No When did it last happen?      Childhood If all above answers are NO, may proceed with cephalosporin use.   Nsaids Anxiety    Reaction: causes dizziness and headache. Ibuprofen. Aspirin    ROS Review of Systems  Respiratory:  Negative for shortness of breath.   Cardiovascular:  Negative for chest pain.  Gastrointestinal:  Negative for diarrhea and nausea.       Normal   Genitourinary:  Negative for dysuria and frequency.  Musculoskeletal:  Positive for arthralgias (left shoulder).  Neurological:  Positive for headaches (pressure to neck). Negative for dizziness.     Objective:    Physical Exam  BP (!) 157/74    Pulse 70    Temp 98 F (36.7 C)    Ht _0  (1.626 m)    Wt 154 lb 6 oz  (70 kg)    SpO2 100%    BMI 26.50 kg/m  Wt Readings from Last 3 Encounters:  01/26/22 154 lb 6 oz (70 kg)  09/22/21 179 lb 0.6 oz (81.2 kg)  04/11/21 171 lb (77.6 kg)     There are no preventive care reminders to display for this patient.   There are no preventive care reminders to display for this patient.  Lab Results  Component Value Date   TSH 1.27 03/05/2016   Lab Results  Component Value Date   WBC 6.2 03/24/2021   HGB 11.2 03/24/2021   HCT 35.0 03/24/2021   MCV 98 (H) 03/24/2021   PLT 208 03/24/2021   Lab Results  Component Value Date   NA 143 09/22/2021   K 3.8 09/22/2021   CO2 29 02/27/2020   GLUCOSE 181 (H) 09/22/2021   BUN 19 09/22/2021   CREATININE 0.91 09/22/2021   BILITOT 0.3 09/22/2021   ALKPHOS 56 09/22/2021   AST 20 09/22/2021   ALT 14 07/03/2019   PROT 6.4 09/22/2021   ALBUMIN 4.2 09/22/2021   CALCIUM 9.3 09/22/2021   ANIONGAP 13 02/27/2020   EGFR 65 09/22/2021   Lab Results  Component Value Date   CHOL 213 (H) 09/22/2021   Lab Results  Component Value Date   HDL 85 09/22/2021   Lab Results  Component Value Date   LDLCALC 100 (H) 09/22/2021   Lab Results  Component Value Date   TRIG 166 (H) 09/22/2021   Lab Results  Component Value Date   CHOLHDL 2.5 09/22/2021   Lab Results  Component Value Date   HGBA1C 6.6 (A) 01/26/2022   HGBA1C 6.6 01/26/2022   HGBA1C 6.6 (A) 01/26/2022   HGBA1C 6.6 01/26/2022      Assessment & Plan:    1. Controlled type 2 diabetes mellitus with microalbuminuria, without long-term current use of insulin (HCC) - POCT glycosylated hemoglobin (  Hb A1C) - Comp. Metabolic Panel (12) Stable/Controlled  Encourage compliance with current treatment regimen  Encourage regular CBG monitoring Encourage contacting office if excessive hyperglycemia and or hypoglycemia Lifestyle modification with healthy diet (fewer calories, more high fiber foods, whole grains and non-starchy vegetables, lower fat meat and  fish, low-fat diary include healthy oils) regular exercise (physical activity) and weight loss Home BP monitoring also encouraged goal <130/80  2. Essential hypertension Encouraged on going compliance with current medication regimen Encouraged home monitoring and recording BP <130/80 Eating a heart-healthy diet with less salt Encouraged regular physical activity  Recommend Weight loss   3. Hyperlipidemia, unspecified hyperlipidemia type - Lipid panel Stable  Encouraged on going compliance with current medication regimen. I recommend eating a heart-healthy diet; low in fat and cholesterol along with regular exercise. This will promote weight loss and improve cholesterol.   4. Insomnia, unspecified type  Discontinue Trazodone   Start Amitriptyline 75 mg x 7 days  May increase to amitriptyline 150 mg qhs if not effective.  Meds ordered this encounter  Medications   amitriptyline (ELAVIL) 75 MG tablet    Sig: Take 1 tablet (75 mg total) by mouth at bedtime for 7 days, THEN 2 tablets (150 mg total) at bedtime.    Dispense:  113 tablet    Refill:  0    Order Specific Question:   Supervising Provider    Answer:   Tresa Garter [id number]    Follow-up: Return in about 6 weeks (around 03/09/2022) for insomina medication change 99213.    Vevelyn Francois, NP

## 2022-01-27 LAB — COMP. METABOLIC PANEL (12)
AST: 19 IU/L (ref 0–40)
Albumin/Globulin Ratio: 1.8 (ref 1.2–2.2)
Albumin: 4.9 g/dL — ABNORMAL HIGH (ref 3.7–4.7)
Alkaline Phosphatase: 54 IU/L (ref 44–121)
BUN/Creatinine Ratio: 13 (ref 12–28)
BUN: 11 mg/dL (ref 8–27)
Bilirubin Total: 0.2 mg/dL (ref 0.0–1.2)
Calcium: 10 mg/dL (ref 8.7–10.3)
Chloride: 102 mmol/L (ref 96–106)
Creatinine, Ser: 0.83 mg/dL (ref 0.57–1.00)
Globulin, Total: 2.8 g/dL (ref 1.5–4.5)
Glucose: 94 mg/dL (ref 70–99)
Potassium: 3.5 mmol/L (ref 3.5–5.2)
Sodium: 145 mmol/L — ABNORMAL HIGH (ref 134–144)
Total Protein: 7.7 g/dL (ref 6.0–8.5)
eGFR: 73 mL/min/{1.73_m2} (ref >59)

## 2022-01-27 LAB — LIPID PANEL
Chol/HDL Ratio: 3.2 ratio (ref 0.0–4.4)
Cholesterol, Total: 193 mg/dL (ref 100–199)
HDL: 61 mg/dL (ref >39)
LDL Chol Calc (NIH): 103 mg/dL — ABNORMAL HIGH (ref 0–99)
Triglycerides: 167 mg/dL — ABNORMAL HIGH (ref 0–149)
VLDL Cholesterol Cal: 29 mg/dL (ref 5–40)

## 2022-02-02 ENCOUNTER — Ambulatory Visit: Payer: Medicare Other | Admitting: Nurse Practitioner

## 2022-02-27 DIAGNOSIS — G4733 Obstructive sleep apnea (adult) (pediatric): Secondary | ICD-10-CM | POA: Diagnosis not present

## 2022-03-03 ENCOUNTER — Inpatient Hospital Stay (HOSPITAL_COMMUNITY)
Admission: EM | Admit: 2022-03-03 | Discharge: 2022-03-05 | DRG: 125 | Disposition: A | Discharge status: Home Health | Payer: Medicare Other | Attending: Internal Medicine | Admitting: Internal Medicine

## 2022-03-03 ENCOUNTER — Other Ambulatory Visit: Payer: Self-pay

## 2022-03-03 ENCOUNTER — Emergency Department (HOSPITAL_COMMUNITY): Payer: Medicare Other

## 2022-03-03 DIAGNOSIS — G4733 Obstructive sleep apnea (adult) (pediatric): Secondary | ICD-10-CM | POA: Diagnosis present

## 2022-03-03 DIAGNOSIS — S0285XD Fracture of orbit, unspecified, subsequent encounter for fracture with routine healing: Secondary | ICD-10-CM | POA: Diagnosis not present

## 2022-03-03 DIAGNOSIS — W1830XA Fall on same level, unspecified, initial encounter: Secondary | ICD-10-CM | POA: Diagnosis present

## 2022-03-03 DIAGNOSIS — Z79899 Other long term (current) drug therapy: Secondary | ICD-10-CM

## 2022-03-03 DIAGNOSIS — H409 Unspecified glaucoma: Secondary | ICD-10-CM | POA: Diagnosis not present

## 2022-03-03 DIAGNOSIS — R609 Edema, unspecified: Secondary | ICD-10-CM | POA: Diagnosis not present

## 2022-03-03 DIAGNOSIS — S0990XA Unspecified injury of head, initial encounter: Secondary | ICD-10-CM | POA: Diagnosis not present

## 2022-03-03 DIAGNOSIS — S0285XA Fracture of orbit, unspecified, initial encounter for closed fracture: Secondary | ICD-10-CM

## 2022-03-03 DIAGNOSIS — Z886 Allergy status to analgesic agent status: Secondary | ICD-10-CM | POA: Diagnosis not present

## 2022-03-03 DIAGNOSIS — G8929 Other chronic pain: Secondary | ICD-10-CM | POA: Diagnosis not present

## 2022-03-03 DIAGNOSIS — E7800 Pure hypercholesterolemia, unspecified: Secondary | ICD-10-CM | POA: Diagnosis not present

## 2022-03-03 DIAGNOSIS — R54 Age-related physical debility: Secondary | ICD-10-CM | POA: Diagnosis present

## 2022-03-03 DIAGNOSIS — Z8673 Personal history of transient ischemic attack (TIA), and cerebral infarction without residual deficits: Secondary | ICD-10-CM

## 2022-03-03 DIAGNOSIS — E876 Hypokalemia: Secondary | ICD-10-CM

## 2022-03-03 DIAGNOSIS — I10 Essential (primary) hypertension: Secondary | ICD-10-CM | POA: Diagnosis present

## 2022-03-03 DIAGNOSIS — Z7984 Long term (current) use of oral hypoglycemic drugs: Secondary | ICD-10-CM | POA: Diagnosis not present

## 2022-03-03 DIAGNOSIS — Z880 Allergy status to penicillin: Secondary | ICD-10-CM | POA: Diagnosis not present

## 2022-03-03 DIAGNOSIS — R269 Unspecified abnormalities of gait and mobility: Secondary | ICD-10-CM | POA: Diagnosis present

## 2022-03-03 DIAGNOSIS — S0231XA Fracture of orbital floor, right side, initial encounter for closed fracture: Secondary | ICD-10-CM | POA: Diagnosis not present

## 2022-03-03 DIAGNOSIS — G319 Degenerative disease of nervous system, unspecified: Secondary | ICD-10-CM | POA: Diagnosis not present

## 2022-03-03 DIAGNOSIS — H1131 Conjunctival hemorrhage, right eye: Secondary | ICD-10-CM | POA: Diagnosis not present

## 2022-03-03 DIAGNOSIS — Y92009 Unspecified place in unspecified non-institutional (private) residence as the place of occurrence of the external cause: Secondary | ICD-10-CM

## 2022-03-03 DIAGNOSIS — Z743 Need for continuous supervision: Secondary | ICD-10-CM | POA: Diagnosis not present

## 2022-03-03 DIAGNOSIS — W19XXXA Unspecified fall, initial encounter: Principal | ICD-10-CM

## 2022-03-03 DIAGNOSIS — R519 Headache, unspecified: Secondary | ICD-10-CM | POA: Diagnosis not present

## 2022-03-03 DIAGNOSIS — S199XXA Unspecified injury of neck, initial encounter: Secondary | ICD-10-CM | POA: Diagnosis not present

## 2022-03-03 DIAGNOSIS — E119 Type 2 diabetes mellitus without complications: Secondary | ICD-10-CM | POA: Diagnosis not present

## 2022-03-03 DIAGNOSIS — M7989 Other specified soft tissue disorders: Secondary | ICD-10-CM | POA: Diagnosis not present

## 2022-03-03 DIAGNOSIS — G4700 Insomnia, unspecified: Secondary | ICD-10-CM | POA: Diagnosis present

## 2022-03-03 DIAGNOSIS — M2578 Osteophyte, vertebrae: Secondary | ICD-10-CM | POA: Diagnosis not present

## 2022-03-03 NOTE — ED Provider Notes (Signed)
? ?[facility]  ?Provider Note ? ?CSN: [id number] ?Arrival date & time: 03/03/22 2222 ? ?History ?Chief Complaint  ?Patient presents with  ? Fall  ? ? ?Beth Hubbard is a 76 y.o. female brought to the ED via EMS after a fall at home. She is unable to tell me details of how she fell. She is [place] and speaks with a strong accent. No family is at bedside and no answer on number listed for daughter. Per EMS she fell at home earlier tonight, hitting her head/face, walked over to family's house across the street where she was noted to have a nosebleed and swelling/redness to her R eye. She uses a walker at home. Not on blood thinners. No reported LOC.  ? ? ?Home Medications ?Prior to Admission medications   ?Medication Sig Start Date End Date Taking? Authorizing Provider  ?ACCU-CHEK AVIVA PLUS test strip Reported on 02/24/2016 09/23/20   Vevelyn Francois, NP  ?Accu-Chek Softclix Lancets lancets Check BS once or twice a day. 09/23/20   Vevelyn Francois, NP  ?amitriptyline (ELAVIL) 75 MG tablet Take 1 tablet (75 mg total) by mouth at bedtime for 7 days, THEN 2 tablets (150 mg total) at bedtime. 01/26/22 03/27/22  Vevelyn Francois, NP  ?amLODipine (NORVASC) 5 MG tablet TAKE 1 TABLET(5 MG) BY MOUTH DAILY 01/16/22   Sueanne Margarita, MD  ?atropine 1 % ophthalmic solution SMARTSIG:1 Drop(s) Right Eye 07/12/21   [provider]  ?Blood Glucose Monitoring Suppl (ACCU-CHEK AVIVA PLUS) w/Device KIT 1 kit by Does not apply route as directed. Check 1 to 2 times daily 09/23/20   Vevelyn Francois, NP  ?Blood Pressure Monitor KIT 1 kit by Does not apply route daily. 03/24/21   Vevelyn Francois, NP  ?brimonidine (ALPHAGAN) 0.2 % ophthalmic solution Place 1 drop into the left eye 2 (two) times daily.    [provider]  ?cyclobenzaprine (FLEXERIL) 10 MG tablet TAKE 1 TABLET(10 MG) BY MOUTH THREE TIMES DAILY AS NEEDED FOR MUSCLE SPASMS 03/24/21   Vevelyn Francois, NP  ?cycloSPORINE (RESTASIS) 0.05 %  ophthalmic emulsion Place 1 drop into both eyes 2 (two) times daily.    [provider]  ?dorzolamide (TRUSOPT) 2 % ophthalmic solution Place 1 drop into the left eye 2 (two) times daily. 07/10/19   [provider]  ?ferrous sulfate 325 (65 FE) MG tablet Take 325 mg by mouth daily.     [provider]  ?gabapentin (NEURONTIN) 300 MG capsule TAKE 1 CAPSULE BY MOUTH EVERY NIGHT AT BEDTIME 09/22/21   Vevelyn Francois, NP  ?ofloxacin (OCUFLOX) 0.3 % ophthalmic solution Place 1 drop into the right eye 4 (four) times daily. 05/30/21   [provider]  ?ondansetron (ZOFRAN-ODT) 4 MG disintegrating tablet Take 1 tablet (4 mg total) by mouth every 8 (eight) hours as needed for nausea or vomiting. 10/31/21   Fenton Foy, NP  ?ROCKLATAN 0.02-0.005 % SOLN Place 1 drop into both eyes at bedtime. 09/07/19   [provider]  ?rosuvastatin (CRESTOR) 20 MG tablet TAKE 1 TABLET(20 MG) BY MOUTH DAILY 01/16/22   Vevelyn Francois, NP  ?sitaGLIPtin (JANUVIA) 50 MG tablet TAKE 1 TABLET(50 MG) BY MOUTH DAILY 09/22/21   Vevelyn Francois, NP  ? ? ? ?Allergies    ?Aspirin, Penicillins, and Nsaids ? ? ?Review of Systems   ?Review of Systems ?Please see HPI for pertinent positives and negatives ? ?Physical Exam ?BP 135/64  Pulse 87   Temp 97.9 ?F (36.6 ?C) (Oral)   Resp 10   SpO2 92%  ? ?Physical Exam ?Vitals and nursing note reviewed.  ?Constitutional:   ?   Appearance: Normal appearance.  ?   Comments: Sleepy but arouses to voice  ?HENT:  ?   Head: Normocephalic and atraumatic.  ?   Nose: Nose normal.  ?   Comments: No active bleeding or septal hematoma ?   Mouth/Throat:  ?   Mouth: Mucous membranes are moist.  ?Eyes:  ?   Extraocular Movements: Extraocular movements intact.  ?   Comments: Conjunctival injection and subconjunctival hematoma on R  ?Cardiovascular:  ?   Rate and Rhythm: Normal rate.  ?Pulmonary:  ?   Effort: Pulmonary effort is normal.  ?   Breath sounds: Normal breath sounds.   ?Abdominal:  ?   General: Abdomen is flat.  ?   Palpations: Abdomen is soft.  ?   Tenderness: There is no abdominal tenderness.  ?Musculoskeletal:     ?   General: No swelling, tenderness or deformity. Normal range of motion.  ?   Cervical back: Neck supple.  ?Skin: ?   General: Skin is warm and dry.  ?Neurological:  ?   General: No focal deficit present.  ?   Mental Status: She is disoriented.  ?   Cranial Nerves: No cranial nerve deficit.  ?   Sensory: No sensory deficit.  ?   Motor: No weakness.  ?Psychiatric:     ?   Mood and Affect: Mood normal.  ? ? ?ED Results / Procedures / Treatments   ?EKG ?EKG Interpretation ? ?Date/Time:  Sunday March 04 2022 00:36:02 EDT ?Ventricular Rate:  71 ?PR Interval:  195 ?QRS Duration: 100 ?QT Interval:  396 ?QTC Calculation: 431 ?R Axis:   -4 ?Text Interpretation: Sinus rhythm Normal ECG No significant change since last tracing Confirmed by Calvert Cantor [phone]) on 03/04/2022 12:44:00 AM ? ?Procedures ?Procedures ? ?Medications Ordered in the ED ?Medications  ?potassium chloride 10 mEq in 100 mL IVPB (10 mEq Intravenous New Bag/Given 03/04/22 0126)  ? ? ?Initial Impression and Plan ? Patient here with reported fall, unclear mechanism but other than face, no other signs of significant injury. Will attempt to get in touch with daughter again. Send for CT of head/neck/cspine.  ? ?ED Course  ? ?Clinical Course as of 03/04/22 0224  ?Sat Mar 03, 2022  ?2351 Per RN, daughter called and is concerned she has been over medicated on Norco and gabapentin and has been falling more frequently. She also reported some left sided weakness. She is moving all extremities during my exam. No obvious focal deficit.  [CS]  ?Sun Mar 04, 2022  ?0038 CMP with mild hypokalemia, will be repletion.  [CS]  ?0039 I personally viewed the images from radiology studies and agree with radiologist interpretation: Reviewed CT images with Dr. Sherrye Payor, Radiology. There is a R orbital floor fracture and signs of  acute blood.  ? [CS]  ?31 Spoke with daughter who confirmed the above concerns regarding the patient's falling, medications and safety at home. She lives alone.  ?I also spoke with Dr. Tobe Sos, on call for Ophtho, who will consult on her in the AM, does not recommend further imaging tonight. Will plan medical admission for further evaluation of her weakness/falling, management of her hypokalemia.  [CS]  ?0223 Spoke with Dr. Marcello Moores, Hospitalist, who will evaluate for admission.  [CS]  ?  ?Clinical Course User Index ?[  CS] Truddie Hidden, MD  ? ? ? ?MDM Rules/Calculators/A&P ?Medical Decision Making ?Problems Addressed: ?Closed fracture of orbit, initial encounter [facility]): acute illness or injury ?Fall, initial encounter: acute illness or injury ?Hypokalemia: acute illness or injury ?Subconjunctival hematoma, right: acute illness or injury ? ?Amount and/or Complexity of Data Reviewed ?Labs: ordered. Decision-making details documented in ED Course. ?Radiology: ordered and independent interpretation performed. Decision-making details documented in ED Course. ?ECG/medicine tests: ordered and independent interpretation performed. Decision-making details documented in ED Course. ? ?Risk ?Prescription drug management. ?Decision regarding hospitalization. ? ? ? ?Final Clinical Impression(s) / ED Diagnoses ?Final diagnoses:  ?Fall, initial encounter  ?Closed fracture of orbit, initial encounter ([place])  ?Subconjunctival hematoma, right  ?Hypokalemia  ? ? ?Rx / DC Orders ?ED Discharge Orders   ? ? None  ? ?  ? ?  ?Truddie Hidden, MD ?03/04/22 [phone] ? ?

## 2022-03-03 NOTE — ED Notes (Addendum)
Pt's daughter called to say that patient has been having several falls lately because she has been feeling dizzy. Daughter states that feels patient may be over medicating or not taking medications correctly, primarily Norco for pain and Gabapentin.  Daughter requesting MRI because pt has been having excessive weakness on left side and states falls always occur with the patient's left side.  This RN notified daughter that concerns and contact information will be relayed to attending.  ?

## 2022-03-03 NOTE — ED Notes (Signed)
[name] [phone] would like an update and to let the rn know some things about her mother ?

## 2022-03-03 NOTE — ED Triage Notes (Signed)
Pt bib [facility] from home for a fall 1 hour ago. Pt fell and hit her head, pt then walked walked over to families house across the street. Pt nose was bleeding. Bleeding controlled upon EMS arrival.Pt has swelling to both eyes, having difficulty opening them. Pt uses a walker to get around at home. No bloodthinners, Denies LOC.  ? ?EMS vitals ?142 Palp BP ?80 HR ?16 RR ?94% O2 ?244 CBG ?

## 2022-03-04 ENCOUNTER — Encounter (HOSPITAL_COMMUNITY): Payer: Self-pay | Admitting: Internal Medicine

## 2022-03-04 DIAGNOSIS — S0285XA Fracture of orbit, unspecified, initial encounter for closed fracture: Secondary | ICD-10-CM | POA: Diagnosis not present

## 2022-03-04 DIAGNOSIS — G8929 Other chronic pain: Secondary | ICD-10-CM | POA: Diagnosis present

## 2022-03-04 DIAGNOSIS — W1830XA Fall on same level, unspecified, initial encounter: Secondary | ICD-10-CM | POA: Diagnosis present

## 2022-03-04 DIAGNOSIS — S0285XD Fracture of orbit, unspecified, subsequent encounter for fracture with routine healing: Secondary | ICD-10-CM | POA: Diagnosis not present

## 2022-03-04 DIAGNOSIS — Z886 Allergy status to analgesic agent status: Secondary | ICD-10-CM | POA: Diagnosis not present

## 2022-03-04 DIAGNOSIS — M7989 Other specified soft tissue disorders: Secondary | ICD-10-CM | POA: Diagnosis not present

## 2022-03-04 DIAGNOSIS — R531 Weakness: Secondary | ICD-10-CM | POA: Diagnosis not present

## 2022-03-04 DIAGNOSIS — Z8673 Personal history of transient ischemic attack (TIA), and cerebral infarction without residual deficits: Secondary | ICD-10-CM | POA: Diagnosis not present

## 2022-03-04 DIAGNOSIS — H1131 Conjunctival hemorrhage, right eye: Secondary | ICD-10-CM | POA: Diagnosis not present

## 2022-03-04 DIAGNOSIS — S0231XA Fracture of orbital floor, right side, initial encounter for closed fracture: Secondary | ICD-10-CM | POA: Diagnosis not present

## 2022-03-04 DIAGNOSIS — Z79899 Other long term (current) drug therapy: Secondary | ICD-10-CM | POA: Diagnosis not present

## 2022-03-04 DIAGNOSIS — R54 Age-related physical debility: Secondary | ICD-10-CM | POA: Diagnosis present

## 2022-03-04 DIAGNOSIS — H409 Unspecified glaucoma: Secondary | ICD-10-CM | POA: Diagnosis present

## 2022-03-04 DIAGNOSIS — Z880 Allergy status to penicillin: Secondary | ICD-10-CM | POA: Diagnosis not present

## 2022-03-04 DIAGNOSIS — Z7984 Long term (current) use of oral hypoglycemic drugs: Secondary | ICD-10-CM | POA: Diagnosis not present

## 2022-03-04 DIAGNOSIS — G4700 Insomnia, unspecified: Secondary | ICD-10-CM | POA: Diagnosis present

## 2022-03-04 DIAGNOSIS — Y92009 Unspecified place in unspecified non-institutional (private) residence as the place of occurrence of the external cause: Secondary | ICD-10-CM | POA: Diagnosis not present

## 2022-03-04 DIAGNOSIS — E7800 Pure hypercholesterolemia, unspecified: Secondary | ICD-10-CM | POA: Diagnosis present

## 2022-03-04 DIAGNOSIS — R269 Unspecified abnormalities of gait and mobility: Secondary | ICD-10-CM | POA: Diagnosis present

## 2022-03-04 DIAGNOSIS — G4733 Obstructive sleep apnea (adult) (pediatric): Secondary | ICD-10-CM | POA: Diagnosis present

## 2022-03-04 DIAGNOSIS — G319 Degenerative disease of nervous system, unspecified: Secondary | ICD-10-CM | POA: Diagnosis not present

## 2022-03-04 DIAGNOSIS — S199XXA Unspecified injury of neck, initial encounter: Secondary | ICD-10-CM | POA: Diagnosis not present

## 2022-03-04 DIAGNOSIS — E876 Hypokalemia: Secondary | ICD-10-CM | POA: Diagnosis not present

## 2022-03-04 DIAGNOSIS — M2578 Osteophyte, vertebrae: Secondary | ICD-10-CM | POA: Diagnosis not present

## 2022-03-04 DIAGNOSIS — E119 Type 2 diabetes mellitus without complications: Secondary | ICD-10-CM | POA: Diagnosis present

## 2022-03-04 DIAGNOSIS — I10 Essential (primary) hypertension: Secondary | ICD-10-CM | POA: Diagnosis present

## 2022-03-04 LAB — CBC
HCT: 37.5 % (ref 36.0–46.0)
Hemoglobin: 12 g/dL (ref 12.0–15.0)
MCH: 32.3 pg (ref 26.0–34.0)
MCHC: 32 g/dL (ref 30.0–36.0)
MCV: 100.8 fL — ABNORMAL HIGH (ref 80.0–100.0)
Platelets: 272 10*3/uL (ref 150–400)
RBC: 3.72 MIL/uL — ABNORMAL LOW (ref 3.87–5.11)
RDW: 14.5 % (ref 11.5–15.5)
WBC: 15.2 10*3/uL — ABNORMAL HIGH (ref 4.0–10.5)
nRBC: 0 % (ref 0.0–0.2)

## 2022-03-04 LAB — CBC WITH DIFFERENTIAL/PLATELET
Abs Immature Granulocytes: 0.02 10*3/uL (ref 0.00–0.07)
Basophils Absolute: 0 10*3/uL (ref 0.0–0.1)
Basophils Relative: 0 %
Eosinophils Absolute: 0.2 10*3/uL (ref 0.0–0.5)
Eosinophils Relative: 3 %
HCT: 32.5 % — ABNORMAL LOW (ref 36.0–46.0)
Hemoglobin: 10.2 g/dL — ABNORMAL LOW (ref 12.0–15.0)
Immature Granulocytes: 0 %
Lymphocytes Relative: 29 %
Lymphs Abs: 2.1 10*3/uL (ref 0.7–4.0)
MCH: 31.6 pg (ref 26.0–34.0)
MCHC: 31.4 g/dL (ref 30.0–36.0)
MCV: 100.6 fL — ABNORMAL HIGH (ref 80.0–100.0)
Monocytes Absolute: 0.5 10*3/uL (ref 0.1–1.0)
Monocytes Relative: 7 %
Neutro Abs: 4.4 10*3/uL (ref 1.7–7.7)
Neutrophils Relative %: 61 %
Platelets: 247 10*3/uL (ref 150–400)
RBC: 3.23 MIL/uL — ABNORMAL LOW (ref 3.87–5.11)
RDW: 14.5 % (ref 11.5–15.5)
WBC: 7.2 10*3/uL (ref 4.0–10.5)
nRBC: 0 % (ref 0.0–0.2)

## 2022-03-04 LAB — URINALYSIS, ROUTINE W REFLEX MICROSCOPIC
Bacteria, UA: NONE SEEN
Bilirubin Urine: NEGATIVE
Glucose, UA: NEGATIVE mg/dL
Ketones, ur: NEGATIVE mg/dL
Nitrite: NEGATIVE
Protein, ur: NEGATIVE mg/dL
Specific Gravity, Urine: 1.008 (ref 1.005–1.030)
pH: 7 (ref 5.0–8.0)

## 2022-03-04 LAB — COMPREHENSIVE METABOLIC PANEL
ALT: 25 U/L (ref 0–44)
AST: 35 U/L (ref 15–41)
Albumin: 3.5 g/dL (ref 3.5–5.0)
Alkaline Phosphatase: 42 U/L (ref 38–126)
Anion gap: 7 (ref 5–15)
BUN: 11 mg/dL (ref 8–23)
CO2: 29 mmol/L (ref 22–32)
Calcium: 8.8 mg/dL — ABNORMAL LOW (ref 8.9–10.3)
Chloride: 102 mmol/L (ref 98–111)
Creatinine, Ser: 0.92 mg/dL (ref 0.44–1.00)
GFR, Estimated: >60 mL/min (ref >60)
Glucose, Bld: 178 mg/dL — ABNORMAL HIGH (ref 70–99)
Potassium: 2.9 mmol/L — ABNORMAL LOW (ref 3.5–5.1)
Sodium: 138 mmol/L (ref 135–145)
Total Bilirubin: 0.5 mg/dL (ref 0.3–1.2)
Total Protein: 6.7 g/dL (ref 6.5–8.1)

## 2022-03-04 LAB — MAGNESIUM: Magnesium: 1.8 mg/dL (ref 1.7–2.4)

## 2022-03-04 LAB — BASIC METABOLIC PANEL
Anion gap: 6 (ref 5–15)
BUN: 7 mg/dL — ABNORMAL LOW (ref 8–23)
CO2: 33 mmol/L — ABNORMAL HIGH (ref 22–32)
Calcium: 9.3 mg/dL (ref 8.9–10.3)
Chloride: 105 mmol/L (ref 98–111)
Creatinine, Ser: 0.75 mg/dL (ref 0.44–1.00)
GFR, Estimated: >60 mL/min (ref >60)
Glucose, Bld: 130 mg/dL — ABNORMAL HIGH (ref 70–99)
Potassium: 3.4 mmol/L — ABNORMAL LOW (ref 3.5–5.1)
Sodium: 144 mmol/L (ref 135–145)

## 2022-03-04 LAB — CBG MONITORING, ED
Glucose-Capillary: 131 mg/dL — ABNORMAL HIGH (ref 70–99)
Glucose-Capillary: 145 mg/dL — ABNORMAL HIGH (ref 70–99)

## 2022-03-04 LAB — GLUCOSE, CAPILLARY
Glucose-Capillary: 138 mg/dL — ABNORMAL HIGH (ref 70–99)
Glucose-Capillary: 155 mg/dL — ABNORMAL HIGH (ref 70–99)

## 2022-03-04 MED ORDER — NETARSUDIL-LATANOPROST 0.02-0.005 % OP SOLN: 1.0000 [drp] | Freq: Every day | OPHTHALMIC | Status: DC

## 2022-03-04 MED ORDER — OXYCODONE HCL 5 MG PO TABS
5.0000 mg | ORAL_TABLET | Freq: Four times a day (QID) | ORAL | Status: DC | PRN
  Administered 2022-03-04 – 2022-03-05 (×3): 5 mg via ORAL
  Filled 2022-03-04 (×3): qty 1

## 2022-03-04 MED ORDER — FERROUS SULFATE 325 (65 FE) MG PO TABS
325.0000 mg | ORAL_TABLET | Freq: Every day | ORAL | Status: DC
Start: 2022-03-04 — End: 2022-03-05
  Administered 2022-03-04 – 2022-03-05 (×2): 325 mg via ORAL
  Filled 2022-03-04 (×2): qty 1

## 2022-03-04 MED ORDER — CYCLOSPORINE 0.05 % OP EMUL: 1.0000 [drp] | Freq: Two times a day (BID) | OPHTHALMIC | Status: DC

## 2022-03-04 MED ORDER — TRAMADOL HCL 50 MG PO TABS
50.0000 mg | ORAL_TABLET | Freq: Four times a day (QID) | ORAL | Status: DC | PRN
  Administered 2022-03-05: 50 mg via ORAL
  Filled 2022-03-04: qty 1

## 2022-03-04 MED ORDER — AMLODIPINE BESYLATE 10 MG PO TABS
10.0000 mg | ORAL_TABLET | Freq: Every day | ORAL | Status: DC
  Administered 2022-03-04 – 2022-03-05 (×2): 10 mg via ORAL
  Filled 2022-03-04: qty 1
  Filled 2022-03-04: qty 2

## 2022-03-04 MED ORDER — CYCLOSPORINE 0.05 % OP EMUL
1.0000 [drp] | Freq: Two times a day (BID) | OPHTHALMIC | Status: DC
  Administered 2022-03-04 – 2022-03-05 (×2): 1 [drp] via OPHTHALMIC
  Filled 2022-03-04 (×3): qty 30

## 2022-03-04 MED ORDER — POTASSIUM CHLORIDE 10 MEQ/100ML IV SOLN
10.0000 meq | INTRAVENOUS | Status: AC
  Administered 2022-03-04 (×4): 10 meq via INTRAVENOUS
  Filled 2022-03-04 (×4): qty 100

## 2022-03-04 MED ORDER — SODIUM CHLORIDE 0.9% FLUSH
3.0000 mL | Freq: Two times a day (BID) | INTRAVENOUS | Status: DC
  Administered 2022-03-04 – 2022-03-05 (×3): 3 mL via INTRAVENOUS

## 2022-03-04 MED ORDER — ACETAMINOPHEN 325 MG PO TABS: 650.0000 mg | ORAL_TABLET | Freq: Four times a day (QID) | ORAL | Status: DC | PRN

## 2022-03-04 MED ORDER — DORZOLAMIDE HCL 2 % OP SOLN: 1.0000 [drp] | Freq: Two times a day (BID) | OPHTHALMIC | Status: DC

## 2022-03-04 MED ORDER — TIZANIDINE HCL 4 MG PO TABS
4.0000 mg | ORAL_TABLET | Freq: Three times a day (TID) | ORAL | Status: DC | PRN
  Administered 2022-03-04 – 2022-03-05 (×2): 4 mg via ORAL
  Filled 2022-03-04 (×2): qty 1

## 2022-03-04 MED ORDER — DORZOLAMIDE HCL 2 % OP SOLN
1.0000 [drp] | Freq: Two times a day (BID) | OPHTHALMIC | Status: DC
  Administered 2022-03-04 – 2022-03-05 (×2): 1 [drp] via OPHTHALMIC
  Filled 2022-03-04: qty 10

## 2022-03-04 MED ORDER — GABAPENTIN 300 MG PO CAPS
600.0000 mg | ORAL_CAPSULE | Freq: Every day | ORAL | Status: DC
  Administered 2022-03-04: 600 mg via ORAL
  Filled 2022-03-04: qty 2

## 2022-03-04 MED ORDER — POTASSIUM CHLORIDE CRYS ER 20 MEQ PO TBCR
40.0000 meq | EXTENDED_RELEASE_TABLET | Freq: Once | ORAL | Status: AC
  Administered 2022-03-04: 40 meq via ORAL
  Filled 2022-03-04: qty 2

## 2022-03-04 MED ORDER — MORPHINE SULFATE (PF) 2 MG/ML IV SOLN: 2.0000 mg | INTRAVENOUS | Status: DC | PRN

## 2022-03-04 MED ORDER — ALBUTEROL SULFATE (2.5 MG/3ML) 0.083% IN NEBU: 2.5000 mg | INHALATION_SOLUTION | RESPIRATORY_TRACT | Status: DC | PRN

## 2022-03-04 MED ORDER — TRAMADOL HCL 50 MG PO TABS: 50.0000 mg | ORAL_TABLET | Freq: Three times a day (TID) | ORAL | Status: DC | PRN

## 2022-03-04 MED ORDER — LATANOPROST 0.005 % OP SOLN
1.0000 [drp] | Freq: Every day | OPHTHALMIC | Status: DC
  Administered 2022-03-04: 1 [drp] via OPHTHALMIC
  Filled 2022-03-04: qty 2.5

## 2022-03-04 MED ORDER — INSULIN ASPART 100 UNIT/ML IJ SOLN: 0.0000 [IU] | Freq: Three times a day (TID) | INTRAMUSCULAR | Status: DC

## 2022-03-04 MED ORDER — ROSUVASTATIN CALCIUM 20 MG PO TABS
20.0000 mg | ORAL_TABLET | Freq: Every day | ORAL | Status: DC
  Administered 2022-03-04 – 2022-03-05 (×2): 20 mg via ORAL
  Filled 2022-03-04 (×2): qty 1

## 2022-03-04 MED ORDER — TRAZODONE HCL 50 MG PO TABS
50.0000 mg | ORAL_TABLET | Freq: Every evening | ORAL | Status: DC | PRN
  Administered 2022-03-04: 50 mg via ORAL
  Filled 2022-03-04: qty 1

## 2022-03-04 MED ORDER — ONDANSETRON HCL 4 MG/2ML IJ SOLN: 4.0000 mg | Freq: Four times a day (QID) | INTRAMUSCULAR | Status: DC | PRN

## 2022-03-04 MED ORDER — OXYCODONE-ACETAMINOPHEN 5-325 MG PO TABS: 1.0000 | ORAL_TABLET | Freq: Three times a day (TID) | ORAL | Status: DC | PRN

## 2022-03-04 MED ORDER — ONDANSETRON HCL 4 MG PO TABS: 4.0000 mg | ORAL_TABLET | Freq: Four times a day (QID) | ORAL | Status: DC | PRN

## 2022-03-04 NOTE — H&P (Signed)
?History and Physical  ? ? ?Beth Hubbard EHM:[id number] DOB: 1946/10/29 DOA: 03/03/2022 ? ?PCP: Vevelyn Francois, NP  ?Patient coming from: home ? ?I have personally briefly reviewed patient's old medical records in [place] ? ?Chief Complaint: fall with facial trauma ? ?HPI: Beth Hubbard is a 76 y.o. female with medical history significant of Diabetes mellitus II, Glaucoma, High cholesterol, Hypertension,  OSA on cpap, TIA,who presents to ED s/p mechanical fall at home where she lost her footing with fall leading to facial trauma. Of note patient per daughter has had increasing falls of late. Patient is known to have OA  and debility and uses a walker at home. She also has chronic pain and is on opioid as well as gabapentin, which her daughter feels may be contributing to her fall due to over medication vs incorrect usage of her medication.  Patient currently states has had increase falls over the last two weeks. She states her left leg just gives out. She notes no prodrome prior to fall. On ros she denies cp, n/v/d/f/c/cough/ abdominal pain. She does complaint of left knee pain and elbow pain as well as right facial pain. ? ? ?ED Course:  ?Afeb, bp 125/56, hr 75, rr 16 sat 95% ?Labs: ?Na 138, K 2.9,gly 178, cr 0.92,  ?Wbc 7.2, hgb:10.2 prior 11.2 ? ?Ct cervical spine: ?IMPRESSION: ?1. No acute fracture or subluxation of the cervical spine. ?2. Moderate to marked severity multilevel degenerative changes, as ?described above. ? ?CT head/CT maxillofacial  ?IMPRESSION: ?1. Acute, displaced fracture deformity involving the floor of the ?right orbit. ?2. 1.8 mm thick curvilinear hyperdense area adjacent to the ?posterolateral aspect of the right globe, which may represent a ?small amount of blood products. Correlation with MRI and an ?ophthalmology consult is recommended. ?3. Moderate severity right-sided facial, periorbital and preseptal ?soft tissue swelling. ?4. Marked severity hyperdense right maxillary  sinus mucosal ?thickening, consistent with a large amount of blood product ? ? ? ?Review of Systems: As per HPI otherwise 10 point review of systems negative.  ? ?Past Medical History:  ?Diagnosis Date  ? Arthritis   ? Diabetes mellitus without complication ([place])   ? Excessive daytime sleepiness   ? Glaucoma   ? High cholesterol   ? Hypertension   ? Mitral regurgitation   ? mild by echo 03/2017  ? OSA (obstructive sleep apnea) 10/18/2015  ? Mild OSA with AHI 12.5/hr on CPAP at 8cm H2O  ? Pneumonia   ? Shingles   ? TIA (transient ischemic attack)   ? ? ?Past Surgical History:  ?Procedure Laterality Date  ? ABDOMINAL SURGERY    ? EYE SURGERY Bilateral   ? cataract surgery  ? ROTATOR CUFF REPAIR    ? bilateral  ? TEE WITHOUT CARDIOVERSION N/A 07/20/2016  ? Procedure: TRANSESOPHAGEAL ECHOCARDIOGRAM (TEE);  Surgeon: Pixie Casino, MD;  Location: [facility];  Service: Cardiovascular;  Laterality: N/A;  ? TOOTH EXTRACTION Left 10/09/2019  ? Procedure: DENTAL EXTRACTION X1 and Irrigation and Debridement.;  Surgeon: Diona Browner, DDS;  Location: [facility];  Service: Oral Surgery;  Laterality: Left;  DENTAL EXTRACTION X1 and Irrigation and Debridement.  ? ? ? reports that she has never smoked. She has never used smokeless tobacco. She reports that she does not drink alcohol and does not use drugs. ? ?Allergies  ?Allergen Reactions  ? Aspirin   ?  Tremor ?  ? Penicillins Itching and Other (See Comments)  ?  Reaction: itching,headache, dizziness  and anxiety. Feels like she is "goin off" ?Did it involve swelling of the face/tongue/throat, SOB, or low BP? No ?Did it involve sudden or severe rash/hives, skin peeling, or any reaction on the inside of your mouth or nose? No ?Did you need to seek medical attention at a hospital or doctor's office? No ?When did it last happen?      Childhood ?If all above answers are ?NO?, may proceed with cephalosporin use.  ? Nsaids Anxiety  ?  Reaction: causes dizziness and headache. Ibuprofen.  Aspirin  ? ? ?Family History  ?Problem Relation Age of Onset  ? Cancer Father   ? Cancer Sister   ? Cancer Brother   ? Breast cancer Neg Hx   ? ? ?Prior to Admission medications   ?Medication Sig Start Date End Date Taking? Authorizing Provider  ?ACCU-CHEK AVIVA PLUS test strip Reported on 02/24/2016 09/23/20   Vevelyn Francois, NP  ?Accu-Chek Softclix Lancets lancets Check BS once or twice a day. 09/23/20   Vevelyn Francois, NP  ?amitriptyline (ELAVIL) 75 MG tablet Take 1 tablet (75 mg total) by mouth at bedtime for 7 days, THEN 2 tablets (150 mg total) at bedtime. 01/26/22 03/27/22  Vevelyn Francois, NP  ?amLODipine (NORVASC) 5 MG tablet TAKE 1 TABLET(5 MG) BY MOUTH DAILY 01/16/22   Sueanne Margarita, MD  ?atropine 1 % ophthalmic solution SMARTSIG:1 Drop(s) Right Eye 07/12/21   [provider]  ?Blood Glucose Monitoring Suppl (ACCU-CHEK AVIVA PLUS) w/Device KIT 1 kit by Does not apply route as directed. Check 1 to 2 times daily 09/23/20   Vevelyn Francois, NP  ?Blood Pressure Monitor KIT 1 kit by Does not apply route daily. 03/24/21   Vevelyn Francois, NP  ?brimonidine (ALPHAGAN) 0.2 % ophthalmic solution Place 1 drop into the left eye 2 (two) times daily.    [provider]  ?cyclobenzaprine (FLEXERIL) 10 MG tablet TAKE 1 TABLET(10 MG) BY MOUTH THREE TIMES DAILY AS NEEDED FOR MUSCLE SPASMS 03/24/21   Vevelyn Francois, NP  ?cycloSPORINE (RESTASIS) 0.05 % ophthalmic emulsion Place 1 drop into both eyes 2 (two) times daily.    [provider]  ?dorzolamide (TRUSOPT) 2 % ophthalmic solution Place 1 drop into the left eye 2 (two) times daily. 07/10/19   [provider]  ?ferrous sulfate 325 (65 FE) MG tablet Take 325 mg by mouth daily.     [provider]  ?gabapentin (NEURONTIN) 300 MG capsule TAKE 1 CAPSULE BY MOUTH EVERY NIGHT AT BEDTIME 09/22/21   Vevelyn Francois, NP  ?ofloxacin (OCUFLOX) 0.3 % ophthalmic solution Place 1 drop into the right eye 4 (four) times daily. 05/30/21   [provider]  ?ondansetron (ZOFRAN-ODT) 4 MG disintegrating tablet Take 1 tablet (4 mg total) by mouth every 8 (eight) hours as needed for nausea or vomiting. 10/31/21   Fenton Foy, NP  ?ROCKLATAN 0.02-0.005 % SOLN Place 1 drop into both eyes at bedtime. 09/07/19   [provider]  ?rosuvastatin (CRESTOR) 20 MG tablet TAKE 1 TABLET(20 MG) BY MOUTH DAILY 01/16/22   Vevelyn Francois, NP  ?sitaGLIPtin (JANUVIA) 50 MG tablet TAKE 1 TABLET(50 MG) BY MOUTH DAILY 09/22/21   Vevelyn Francois, NP  ? ? ?Physical Exam: ?Vitals:  ? 03/03/22 2300 03/03/22 2330 03/04/22 0030 03/04/22 0115  ?BP: (!) 115/53 (!) 121/55 (!) 115/54 131/62  ?Pulse: 70 71 70 76  ?Resp: _0 ?Temp:      ?  TempSrc:      ?SpO2: 94% 94% 96% 92%  ? ? ? ?Vitals:  ? 03/03/22 2300 03/03/22 2330 03/04/22 0030 03/04/22 0115  ?BP: (!) 115/53 (!) 121/55 (!) 115/54 131/62  ?Pulse: 70 71 70 76  ?Resp: _0 ?Temp:      ?TempSrc:      ?SpO2: 94% 94% 96% 92%  ?Constitutional: NAD, noted to be in some pain ?Eyes:right eye swollen closed, noted ecchymosis on right upper portion of face ?ENMT: Mucous membranes are moist.  ?Neck: normal, supple, no masses, no thyromegaly ?Respiratory: clear to auscultation bilaterally, no wheezing, no crackles. Normal respiratory effort. No accessory muscle use.  ?Cardiovascular: Regular rate and rhythm, no murmurs / rubs / gallops. No extremity edema. 2+ pedal pulses.  ?Abdomen: no tenderness, no masses palpated. No hepatosplenomegaly. Bowel sounds positive.  ?Musculoskeletal: no clubbing / cyanosis. No joint deformity upper and lower extremities. Good ROM, no contractures. Normal muscle tone.  ?Skin: no rashes, lesions, ulcers. No induration ?Neurologic: CN 7-12 grossly intact. Sensation intact, DTR normal. Strength 4-4+/5 in all 4.  ?Psychiatric: Normal judgment and insight. Alert and oriented . Normal mood.  ? ? ?Labs on Admission: I have personally reviewed following labs and imaging studies ? ?CBC: ?Recent  Labs  ?Lab 03/03/22 ?2235  ?WBC 7.2  ?NEUTROABS 4.4  ?HGB 10.2*  ?HCT 32.5*  ?MCV 100.6*  ?PLT 247  ? ?Basic Metabolic Panel: ?Recent Labs  ?Lab 03/03/22 ?2235  ?NA 138  ?K 2.9*  ?CL 102  ?CO2 29  ?GLUCOSE 178*  ?BU

## 2022-03-04 NOTE — ED Notes (Signed)
Pt placed on 2L 02 via nasal cannula.  ?

## 2022-03-04 NOTE — Plan of Care (Signed)
  Problem: Education: Goal: Knowledge of General Education information will improve Description: Including pain rating scale, medication(s)/side effects and non-pharmacologic comfort measures Outcome: Progressing   Problem: Health Behavior/Discharge Planning: Goal: Ability to manage health-related needs will improve Outcome: Progressing   Problem: Clinical Measurements: Goal: Ability to maintain clinical measurements within normal limits will improve Outcome: Progressing Goal: Will remain free from infection Outcome: Progressing Goal: Diagnostic test results will improve Outcome: Progressing Goal: Respiratory complications will improve Outcome: Progressing Goal: Cardiovascular complication will be avoided Outcome: Progressing   Problem: Activity: Goal: Risk for activity intolerance will decrease Outcome: Progressing   Problem: Nutrition: Goal: Adequate nutrition will be maintained Outcome: Progressing   Problem: Elimination: Goal: Will not experience complications related to bowel motility Outcome: Progressing Goal: Will not experience complications related to urinary retention Outcome: Progressing   Problem: Coping: Goal: Level of anxiety will decrease Outcome: Progressing   Problem: Pain Managment: Goal: General experience of comfort will improve Outcome: Progressing   Problem: Safety: Goal: Ability to remain free from injury will improve Outcome: Progressing   

## 2022-03-04 NOTE — Consult Note (Signed)
CC:  ?Chief Complaint  ?Patient presents with  ? Fall  ? ? ?HPI: ?Beth Hubbard is a 76 y.o. female  PMH below who presents for evaluation of fall. Patient says last night she fell and hit her head, then presented to emergency department.  Her primary complaint at this time is pain.  ? ?ROS: ?Negative except as otherwise stated. ? ?PMH: ?Past Medical History:  ?Diagnosis Date  ? Arthritis   ? Diabetes mellitus without complication ([place])   ? Excessive daytime sleepiness   ? Glaucoma   ? High cholesterol   ? Hypertension   ? Mitral regurgitation   ? mild by echo 03/2017  ? OSA (obstructive sleep apnea) 10/18/2015  ? Mild OSA with AHI 12.5/hr on CPAP at 8cm H2O  ? Pneumonia   ? Shingles   ? TIA (transient ischemic attack)   ? ? ?PSH: ?Past Surgical History:  ?Procedure Laterality Date  ? ABDOMINAL SURGERY    ? EYE SURGERY Bilateral   ? cataract surgery  ? ROTATOR CUFF REPAIR    ? bilateral  ? TEE WITHOUT CARDIOVERSION N/A 07/20/2016  ? Procedure: TRANSESOPHAGEAL ECHOCARDIOGRAM (TEE);  Surgeon: Pixie Casino, MD;  Location: [facility];  Service: Cardiovascular;  Laterality: N/A;  ? TOOTH EXTRACTION Left 10/09/2019  ? Procedure: DENTAL EXTRACTION X1 and Irrigation and Debridement.;  Surgeon: Diona Browner, DDS;  Location: [facility];  Service: Oral Surgery;  Laterality: Left;  DENTAL EXTRACTION X1 and Irrigation and Debridement.  ? ? ?Meds: ?No current facility-administered medications on file prior to encounter.  ? ?Current Outpatient Medications on File Prior to Encounter  ?Medication Sig Dispense Refill  ? acetaminophen (TYLENOL) 325 MG tablet Take 650 mg by mouth every 6 (six) hours as needed for moderate pain or headache.    ? cycloSPORINE (RESTASIS) 0.05 % ophthalmic emulsion Place 1 drop into both eyes 2 (two) times daily.    ? dorzolamide (TRUSOPT) 2 % ophthalmic solution Place 1 drop into the left eye 2 (two) times daily.    ? ferrous sulfate 325 (65 FE) MG tablet Take 325 mg by mouth daily.     ? gabapentin  (NEURONTIN) 300 MG capsule TAKE 1 CAPSULE BY MOUTH EVERY NIGHT AT BEDTIME (Patient taking differently: Take 600 mg by mouth at bedtime.) 90 capsule 3  ? Omega-3 Fatty Acids (FISH OIL) 1000 MG CAPS Take 1,000 mg by mouth daily.    ? ondansetron (ZOFRAN-ODT) 4 MG disintegrating tablet Take 1 tablet (4 mg total) by mouth every 8 (eight) hours as needed for nausea or vomiting. 10 tablet 0  ? oxyCODONE-acetaminophen (PERCOCET/ROXICET) 5-325 MG tablet Take 1 tablet by mouth every 8 (eight) hours as needed for moderate pain.    ? ROCKLATAN 0.02-0.005 % SOLN Place 1 drop into both eyes at bedtime.    ? rosuvastatin (CRESTOR) 20 MG tablet TAKE 1 TABLET(20 MG) BY MOUTH DAILY (Patient taking differently: Take 20 mg by mouth daily.) 90 tablet 0  ? sitaGLIPtin (JANUVIA) 50 MG tablet TAKE 1 TABLET(50 MG) BY MOUTH DAILY (Patient taking differently: Take 50 mg by mouth daily.) 90 tablet 3  ? tiZANidine (ZANAFLEX) 4 MG tablet Take 4 mg by mouth 3 (three) times daily as needed for muscle spasms.    ? traZODone (DESYREL) 50 MG tablet Take 50 mg by mouth at bedtime as needed for sleep.    ? ACCU-CHEK AVIVA PLUS test strip Reported on 02/24/2016 100 each 1  ? Accu-Chek Softclix Lancets lancets Check BS once or  twice a day. 100 each 1  ? amLODipine (NORVASC) 5 MG tablet TAKE 1 TABLET(5 MG) BY MOUTH DAILY (Patient taking differently: Take 5 mg by mouth daily.) 90 tablet 0  ? Blood Glucose Monitoring Suppl (ACCU-CHEK AVIVA PLUS) w/Device KIT 1 kit by Does not apply route as directed. Check 1 to 2 times daily 1 kit 0  ? Blood Pressure Monitor KIT 1 kit by Does not apply route daily. 1 kit 0  ? ? ?SH: ?Social History  ? ?Socioeconomic History  ? Marital status: Legally Separated  ?  Spouse name: Not on file  ? Number of children: Not on file  ? Years of education: Not on file  ? Highest education level: Not on file  ?Occupational History  ? Not on file  ?Tobacco Use  ? Smoking status: Never  ? Smokeless tobacco: Never  ?Vaping Use  ? Vaping  Use: Never used  ?Substance and Sexual Activity  ? Alcohol use: No  ? Drug use: No  ? Sexual activity: Not Currently  ?Other Topics Concern  ? Not on file  ?Social History Narrative  ? Not on file  ? ?Social Determinants of Health  ? ?Financial Resource Strain: Low Risk   ? Difficulty of Paying Living Expenses: Not hard at all  ?Food Insecurity: Food Insecurity Present  ? Worried About Charity fundraiser in the Last Year: Never true  ? Ran Out of Food in the Last Year: Sometimes true  ?Transportation Needs: No Transportation Needs  ? Lack of Transportation (Medical): No  ? Lack of Transportation (Non-Medical): No  ?Physical Activity: Sufficiently Active  ? Days of Exercise per Week: 7 days  ? Minutes of Exercise per Session: 150+ min  ?Stress: No Stress Concern Present  ? Feeling of Stress : Only a little  ?Social Connections: Moderately Integrated  ? Frequency of Communication with Friends and Family: More than three times a week  ? Frequency of Social Gatherings with Friends and Family: Once a week  ? Attends Religious Services: More than 4 times per year  ? Active Member of Clubs or Organizations: Yes  ? Attends Archivist Meetings: More than 4 times per year  ? Marital Status: Separated  ? ? ?FH: ?Family History  ?Problem Relation Age of Onset  ? Cancer Father   ? Cancer Sister   ? Cancer Brother   ? Breast cancer Neg Hx   ? ? ? ?Past Ocular History: glaucoma w/ surgery OD ? ? ?Last Eye Exam: ? ? ? ?Primary Eye Care: [facility] ? ? ?Past Medical History:  ?Diagnosis Date  ? Arthritis   ? Diabetes mellitus without complication ([place])   ? Excessive daytime sleepiness   ? Glaucoma   ? High cholesterol   ? Hypertension   ? Mitral regurgitation   ? mild by echo 03/2017  ? OSA (obstructive sleep apnea) 10/18/2015  ? Mild OSA with AHI 12.5/hr on CPAP at 8cm H2O  ? Pneumonia   ? Shingles   ? TIA (transient ischemic attack)   ? ? ? ?Past Surgical History:  ?Procedure Laterality Date  ? ABDOMINAL SURGERY    ? EYE  SURGERY Bilateral   ? cataract surgery  ? ROTATOR CUFF REPAIR    ? bilateral  ? TEE WITHOUT CARDIOVERSION N/A 07/20/2016  ? Procedure: TRANSESOPHAGEAL ECHOCARDIOGRAM (TEE);  Surgeon: Pixie Casino, MD;  Location: [facility];  Service: Cardiovascular;  Laterality: N/A;  ? TOOTH EXTRACTION Left 10/09/2019  ? Procedure: DENTAL  EXTRACTION X1 and Irrigation and Debridement.;  Surgeon: Diona Browner, DDS;  Location: [facility];  Service: Oral Surgery;  Laterality: Left;  DENTAL EXTRACTION X1 and Irrigation and Debridement.  ? ? ? ?Social History  ? ?Socioeconomic History  ? Marital status: Legally Separated  ?  Spouse name: Not on file  ? Number of children: Not on file  ? Years of education: Not on file  ? Highest education level: Not on file  ?Occupational History  ? Not on file  ?Tobacco Use  ? Smoking status: Never  ? Smokeless tobacco: Never  ?Vaping Use  ? Vaping Use: Never used  ?Substance and Sexual Activity  ? Alcohol use: No  ? Drug use: No  ? Sexual activity: Not Currently  ?Other Topics Concern  ? Not on file  ?Social History Narrative  ? Not on file  ? ?Social Determinants of Health  ? ?Financial Resource Strain: Low Risk   ? Difficulty of Paying Living Expenses: Not hard at all  ?Food Insecurity: Food Insecurity Present  ? Worried About Charity fundraiser in the Last Year: Never true  ? Ran Out of Food in the Last Year: Sometimes true  ?Transportation Needs: No Transportation Needs  ? Lack of Transportation (Medical): No  ? Lack of Transportation (Non-Medical): No  ?Physical Activity: Sufficiently Active  ? Days of Exercise per Week: 7 days  ? Minutes of Exercise per Session: 150+ min  ?Stress: No Stress Concern Present  ? Feeling of Stress : Only a little  ?Social Connections: Moderately Integrated  ? Frequency of Communication with Friends and Family: More than three times a week  ? Frequency of Social Gatherings with Friends and Family: Once a week  ? Attends Religious Services: More than 4 times per year   ? Active Member of Clubs or Organizations: Yes  ? Attends Archivist Meetings: More than 4 times per year  ? Marital Status: Separated  ?Intimate Partner Violence: Not At Risk  ? Fear of Current or Ex-P

## 2022-03-04 NOTE — ED Notes (Signed)
Dr. Karle Starch notified of daughter's concerns per previous note made by this RN.  ?

## 2022-03-04 NOTE — Progress Notes (Signed)
? ?  Beth Hubbard  JJK:[id number] DOB: 1946/05/27 DOA: 03/03/2022 ?PCP: Vevelyn Francois, NP   ? ?Brief Narrative:  ?76 year old with history of DM2, glaucoma, HLD, HTN, OSA on CPAP, and TIA who presented to the ED after suffering a mechanical fall at home in which she suffered significant facial trauma.  She requires use of a walker at home due to OA but is also on a narcotic and gabapentin and her daughter feels that this contributes to her falling.  In the ED CT cervical spine revealed no acute fracture or subluxation.  CT of the of the head and face revealed an acute displaced fracture in the involving the floor of the right orbit and a possible area of bleeding adjacent to the posterior lateral aspect of the right globe.  Also noted was significant right maxillary sinus blood. ? ?Consultants:  ?Ophthalmology ? ?Code Status: FULL CODE ? ?DVT prophylaxis: ?SCDs ? ?Interim Hx: ?Afebrile since admission.  Mild tachycardia at times.  Blood pressure stable/mildly elevated.  Saturations stable. No new complaints in f/u. Appears quite frail and weak in general. Informs me she lives alone. Will ask PT/OT to see. ? ?Assessment & Plan: ? ?Right displaced orbital fracture after mechanical fall ?Ophthalmology has evaluated and fortunately no acute intervention is required - to f/u in outpt setting  ? ?Mechanical fall ?Likely primarily age-related debility -PT/OT to see -monitor for sedation/instability with use of home pain medicines while in hospital ? ?Hypokalemia ?Supplement and follow -magnesium reasonable ? ?DM2 ?CBG well controlled ? ?HLD ?Continue usual home statin ? ?HTN ?Continue usual dose of amlodipine ? ?OSA ?Holding on CPAP at present given facial fracture/fascial distortion with edema ? ? ?Family Communication:  ?Disposition:  ? ?Objective: ?Blood pressure (!) 158/78, pulse (!) 104, temperature 97.9 ?F (36.6 ?C), temperature source Oral, resp. rate 18, SpO2 92 %. ? ?Intake/Output Summary (Last 24 hours) at  03/04/2022 0751 ?Last data filed at 03/04/2022 0331 ?Gross per 24 hour  ?Intake 192.72 ml  ?Output --  ?Net 192.72 ml  ? ?There were no vitals filed for this visit. ? ?Examination: ?Exam completed by my partner earlier today -patient seen for follow-up visit ? ?CBC: ?Recent Labs  ?Lab 03/03/22 ?2235 03/04/22 ?0645  ?WBC 7.2 15.2*  ?NEUTROABS 4.4  --   ?HGB 10.2* 12.0  ?HCT 32.5* 37.5  ?MCV 100.6* 100.8*  ?PLT 247 272  ? ?Basic Metabolic Panel: ?Recent Labs  ?Lab 03/03/22 ?2235 03/04/22 ?0645  ?NA 138 144  ?K 2.9* 3.4*  ?CL 102 105  ?CO2 29 33*  ?GLUCOSE 178* 130*  ?BUN 11 7*  ?CREATININE 0.92 0.75  ?CALCIUM 8.8* 9.3  ?MG  --  1.8  ? ?GFR: ?CrCl cannot be calculated (Unknown ideal weight.). ? ?Liver Function Tests: ?Recent Labs  ?Lab 03/03/22 ?2235  ?AST 35  ?ALT 25  ?ALKPHOS 42  ?BILITOT 0.5  ?PROT 6.7  ?ALBUMIN 3.5  ? ?CBG: ?No results for input(s): GLUCAP in the last 168 hours. ? ?Scheduled Meds: ? insulin aspart  0-6 Units Subcutaneous TID WC  ? sodium chloride flush  3 mL Intravenous Q12H  ? ? ? LOS: 0 days  ? ?Cherene Altes, MD ?[facility] ?Office  [phone] ?Pager - Text Page per Shea Evans ? ?If 7PM-7AM, please contact night-coverage per Amion ?03/04/2022, 7:51 AM ? ? ? ?

## 2022-03-04 NOTE — ED Notes (Signed)
Pt returned from CT scan.

## 2022-03-04 NOTE — ED Notes (Signed)
Breakfast order placed ?

## 2022-03-05 DIAGNOSIS — H1131 Conjunctival hemorrhage, right eye: Secondary | ICD-10-CM

## 2022-03-05 DIAGNOSIS — E876 Hypokalemia: Secondary | ICD-10-CM

## 2022-03-05 LAB — GLUCOSE, CAPILLARY
Glucose-Capillary: 136 mg/dL — ABNORMAL HIGH (ref 70–99)
Glucose-Capillary: 138 mg/dL — ABNORMAL HIGH (ref 70–99)
Glucose-Capillary: 138 mg/dL — ABNORMAL HIGH (ref 70–99)

## 2022-03-05 LAB — CBC
HCT: 35.1 % — ABNORMAL LOW (ref 36.0–46.0)
Hemoglobin: 11.5 g/dL — ABNORMAL LOW (ref 12.0–15.0)
MCH: 32 pg (ref 26.0–34.0)
MCHC: 32.8 g/dL (ref 30.0–36.0)
MCV: 97.8 fL (ref 80.0–100.0)
Platelets: 258 10*3/uL (ref 150–400)
RBC: 3.59 MIL/uL — ABNORMAL LOW (ref 3.87–5.11)
RDW: 14.7 % (ref 11.5–15.5)
WBC: 10.4 10*3/uL (ref 4.0–10.5)
nRBC: 0 % (ref 0.0–0.2)

## 2022-03-05 LAB — MAGNESIUM: Magnesium: 2.2 mg/dL (ref 1.7–2.4)

## 2022-03-05 LAB — BASIC METABOLIC PANEL
Anion gap: 8 (ref 5–15)
BUN: 8 mg/dL (ref 8–23)
CO2: 31 mmol/L (ref 22–32)
Calcium: 9.3 mg/dL (ref 8.9–10.3)
Chloride: 100 mmol/L (ref 98–111)
Creatinine, Ser: 0.83 mg/dL (ref 0.44–1.00)
GFR, Estimated: >60 mL/min (ref >60)
Glucose, Bld: 128 mg/dL — ABNORMAL HIGH (ref 70–99)
Potassium: 3.8 mmol/L (ref 3.5–5.1)
Sodium: 139 mmol/L (ref 135–145)

## 2022-03-05 NOTE — TOC Initial Note (Signed)
Transition of Care (TOC) - Initial/Assessment Note  ? ? ?Patient Details  ?Name: Beth Hubbard ?MRN: [id number] ?Date of Birth: 08/08/1946 ? ?Transition of Care [facility]) CM/SW Contact:    ?Joanne Chars, LCSW ?Phone Number: ?03/05/2022, 3:14 PM ? ?Clinical Narrative:   CSW met with pt regarding DC recommendation for SNF.  Pt does not wish to go to SNF, wants to return home.  Pt lives alone but has support from daughter and other family members.  Permission given to speak with daughter Beth Hubbard.  Current DME in home: cane. ? ?CSW spoke with daughter Beth Hubbard, discussed PT recommendation for SNF and her mother stating she wants to go home with [facility].  Beth Hubbard is planning to have her mother stay with her after DC and would support plan for her to be home with [facility].  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              ? ? ?Expected Discharge Plan: [place] ?Barriers to Discharge: Continued Medical Work up ? ? ?Patient Goals and CMS Choice ?Patient states their goals for this hospitalization and ongoing recovery are:: "I don't know" ?  ?  ? ?Expected Discharge Plan and Services ?Expected Discharge Plan: [place] ?In-house Referral: Clinical Social Work ?  ?Post Acute Care Choice: Home Health ?Living arrangements for the past 2 months: [place] ?                ?  ?  ?  ?  ?  ?  ?  ?  ?  ?  ? ?Prior Living Arrangements/Services ?Living arrangements for the past 2 months: [place] ?Lives with:: Self ?Patient language and need for interpreter reviewed:: Yes ?Do you feel safe going back to the place where you live?: Yes      ?Need for Family Participation in Patient Care: Yes (Comment) ?Care giver support system in place?: Yes (comment) ?Current home services: Other (comment) (none) ?Criminal Activity/Legal Involvement Pertinent to Current Situation/Hospitalization: No - Comment as needed ? ?Activities of Daily Living ?Home Assistive  Devices/Equipment: Cane (specify quad or straight), Walker (specify type) ?ADL Screening (condition at time of admission) ?Patient's cognitive ability adequate to safely complete daily activities?: Yes ?Is the patient deaf or have difficulty hearing?: No ?Does the patient have difficulty seeing, even when wearing glasses/contacts?: Yes ?Does the patient have difficulty concentrating, remembering, or making decisions?: No ?Patient able to express need for assistance with ADLs?: Yes ?Does the patient have difficulty dressing or bathing?: Yes ?Independently performs ADLs?: Yes (appropriate for developmental age) ?Does the patient have difficulty walking or climbing stairs?: Yes ?Weakness of Legs: Both ?Weakness of Arms/Hands: Both ? ?Permission Sought/Granted ?Permission sought to share information with : Family Supports ?Permission granted to share information with : Yes, Verbal Permission Granted ? Share Information with NAME: daughter Beth Hubbard ?   ?   ?   ? ?Emotional Assessment ?Appearance:: Appears stated age ?Attitude/Demeanor/Rapport: Engaged ?Affect (typically observed): Appropriate, Pleasant ?Orientation: : Oriented to Self, Oriented to Place, Oriented to  Time, Oriented to Situation ?Alcohol / Substance Use: Not Applicable ?Psych Involvement: No (comment) ? ?Admission diagnosis:  Hypokalemia [E87.6] ?Right orbit fracture ([place]) [S02.85XA] ?Closed fracture of orbit, initial encounter ([place]) [S02.85XA] ?Fall, initial encounter [W19.XXXA] ?Subconjunctival hematoma, right [H11.31] ?Patient Active Problem List  ? Diagnosis Date Noted  ? Right orbit fracture ([place]) 03/04/2022  ? Pulmonary HTN ([place])  09/23/2020  ? 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[place]) 06/29/2020  ? OSA (obstructive sleep apnea) 10/18/2015  ? Mitral regurgitation 05/18/2015  ? Excessive daytime sleepiness 02/28/2015  ? TIA (transient ischemic attack)   ? Hypertension   ? Diabetes mellitus without complication ([place])   ? High  cholesterol   ? ?PCP:  Vevelyn Francois, NP ?Pharmacy:   ?[facility], [address] ?[place] ?[place] [address] ?Phone: [phone] Fax: [phone] ? ?[facility] [phone] - [place], [address] ?[place] [address] ?Phone: [phone] Fax: [phone] ? ?[id number] [place] [place] - [address]?Phone: [phone]: [phone]? ? ? ? ?Social Determinants of Health (SDOH) Interventions ?  ? ?Readmission Risk Interventions ?   ? View : No data to display.  ?  ?  ?  ? ? ? ?

## 2022-03-05 NOTE — Discharge Instructions (Signed)
DO NOT BLOW YOUR NOSE for 10 DAYS due to the fracture of your eye socket bone. ? ?DO NOT USE CPAP for 10 days due to the facial trauma associated with your fall.  ? ? ?

## 2022-03-05 NOTE — Evaluation (Signed)
Occupational Therapy Evaluation ?Patient Details ?Name: Beth Hubbard ?MRN: [id number] ?DOB: [birth year] ?Today's Date: 03/05/2022 ? ? ?History of Present Illness Pt is a 76 y.o. female admitted 4/15 following a fall at home hitting her head. She sustained right orbital floor fracture.  PMH: DMII, glaucoma, high cholesterol, HTN, OSA on CPAP, TIA  ? ?Clinical Impression ?  ?Pt reports independence at baseline with ADLs and functional mobility, uses cane at baseline. Pt lives alone in first floor apartment, reports she has neighbors/family nearby that can assist. Pt reporting blurriness, does wear glasses at baseline however are not in hospital with her. Pt able to identify numbers held up in different visual quadrants with 50% accuracy. Pt currently min A for standing ADLs, supervision for bed mobility, and min A for transfers/in-room ambulation with 1 person handheld assist. Pt presenting with impairments listed below, will follow. Recommend SNF at d/c due to decreased support at home, [facility] possible if 24/7 supervision can be arranged.  ?   ? ?Recommendations for follow up therapy are one component of a multi-disciplinary discharge planning process, led by the attending physician.  Recommendations may be updated based on patient status, additional functional criteria and insurance authorization.  ? ?Follow Up Recommendations ? Skilled nursing-short term rehab (<3 hours/day) (HHOT if pt is able to have 24/7 supervision)  ?  ?Assistance Recommended at Discharge Intermittent Supervision/Assistance  ?Patient can return home with the following A little help with walking and/or transfers;A lot of help with bathing/dressing/bathroom;Assistance with cooking/housework;Assist for transportation;Help with stairs or ramp for entrance ? ?  ?Functional Status Assessment ? Patient has had a recent decline in their functional status and demonstrates the ability to make significant improvements in function in a reasonable and  predictable amount of time.  ?Equipment Recommendations ? BSC/3in1 (as shower seat)  ?  ?Recommendations for Other Services   ? ? ?  ?Precautions / Restrictions Precautions ?Precautions: Fall ?Restrictions ?Weight Bearing Restrictions: No  ? ?  ? ?Mobility Bed Mobility ?Overal bed mobility: Needs Assistance ?Bed Mobility: Supine to Sit ?  ?  ?Supine to sit: Supervision ?  ?  ?  ?  ? ?Transfers ?Overall transfer level: Needs assistance ?Equipment used: 1 person hand held assist ?Transfers: Sit to/from Stand ?Sit to Stand: Min assist ?  ?  ?  ?  ?  ?General transfer comment: increased time to stabilize balance ?  ? ?  ?Balance   ?  ?  ?  ?  ?  ?Standing balance-Leahy Scale: Fair ?Standing balance comment: use of furniture to walk, 1 person handheld assist ?  ?  ?  ?  ?  ?  ?  ?  ?  ?  ?  ?   ? ?ADL either performed or assessed with clinical judgement  ? ?ADL Overall ADL's : Needs assistance/impaired ?Eating/Feeding: Set up;Sitting ?  ?Grooming: Set up;Standing;Wash/dry face;Oral care ?Grooming Details (indicate cue type and reason): brushes teeth standing at sink ?Upper Body Bathing: Minimal assistance;Sitting ?  ?Lower Body Bathing: Minimal assistance;Sitting/lateral leans;Sit to/from stand ?  ?Upper Body Dressing : Minimal assistance;Standing;Sitting ?  ?Lower Body Dressing: Minimal assistance;Sit to/from stand;Sitting/lateral leans ?  ?Toilet Transfer: Nature conservation officer;Ambulation ?  ?Toileting- Clothing Manipulation and Hygiene: Min guard;Sitting/lateral lean;Sit to/from stand ?  ?  ?  ?Functional mobility during ADLs: Min guard ?   ? ? ? ?Vision Baseline Vision/History: 3 Glaucoma ?Ability to See in Adequate Light: 2 Moderately impaired ?Patient Visual Report: Blurring of vision ?Vision Assessment?: Vision  impaired- to be further tested in functional context ?Additional Comments: pt with 50% accuracy identifying numbers held up in front of her in different gaze quadrants  ?   ?Perception   ?  ?Praxis   ?   ? ?Pertinent Vitals/Pain Pain Assessment ?Pain Assessment: Faces ?Pain Score: 8  ?Faces Pain Scale: Hurts whole lot ?Pain Location: R eye/facial area ?Pain Descriptors / Indicators: Constant, Discomfort, Grimacing, Guarding ?Pain Intervention(s): Limited activity within patient's tolerance, Monitored during session, Repositioned  ? ? ? ?Hand Dominance   ?  ?Extremity/Trunk Assessment Upper Extremity Assessment ?Upper Extremity Assessment: Overall WFL for tasks assessed ?  ?Lower Extremity Assessment ?Lower Extremity Assessment: Defer to PT evaluation ?  ?Cervical / Trunk Assessment ?Cervical / Trunk Assessment: Normal ?  ?Communication Communication ?Communication: No difficulties ?  ?Cognition Arousal/Alertness: Awake/alert ?Behavior During Therapy: [facility] for tasks assessed/performed ?Overall Cognitive Status: No family/caregiver present to determine baseline cognitive functioning ?Area of Impairment: Safety/judgement ?  ?  ?  ?  ?  ?  ?  ?  ?  ?  ?  ?  ?Safety/Judgement: Decreased awareness of deficits, Decreased awareness of safety ?  ?  ?  ?  ?  ?General Comments  VSS on RA ? ?  ?Exercises   ?  ?Shoulder Instructions    ? ? ?Home Living Family/patient expects to be discharged to:: Private residence ?Living Arrangements: Alone ?Available Help at Discharge: Neighbor;Available PRN/intermittently;Family ?Type of Home: Apartment ?Home Access: Level entry ?  ?  ?Home Layout: One level ?  ?  ?Bathroom Shower/Tub: Walk-in shower ?  ?Bathroom Toilet: Standard ?  ?  ?Home Equipment: Kasandra Knudsen - single point ?  ?  ?  ? ?  ?Prior Functioning/Environment Prior Level of Function : Independent/Modified Independent ?  ?  ?  ?  ?  ?  ?Mobility Comments: uses cane ?ADLs Comments: does IADLs ?  ? ?  ?  ?OT Problem List: Decreased strength;Decreased range of motion;Impaired balance (sitting and/or standing);Decreased activity tolerance;Impaired vision/perception;Decreased knowledge of use of DME or AE;Decreased safety awareness ?  ?    ?OT Treatment/Interventions: Self-care/ADL training;Therapeutic exercise;Therapeutic activities;Visual/perceptual remediation/compensation;Patient/family education;Balance training;DME and/or AE instruction;Energy conservation  ?  ?OT Goals(Current goals can be found in the care plan section) Acute Rehab OT Goals ?Patient Stated Goal: none stated ?OT Goal Formulation: With patient ?Time For Goal Achievement: 03/19/22 ?Potential to Achieve Goals: Good ?ADL Goals ?Pt Will Perform Upper Body Dressing: with supervision;sitting ?Pt Will Perform Lower Body Dressing: with min guard assist;sit to/from stand;sitting/lateral leans ?Pt Will Transfer to Toilet: with supervision;regular height toilet;ambulating ?Pt Will Perform Tub/Shower Transfer: with min guard assist;3 in 1;Shower transfer;Tub transfer  ?OT Frequency: Min 2X/week ?  ? ?Co-evaluation   ?  ?  ?  ?  ? ?  ?AM-PAC OT "6 Clicks" Daily Activity     ?Outcome Measure Help from another person eating meals?: A Little ?Help from another person taking care of personal grooming?: A Little ?Help from another person toileting, which includes using toliet, bedpan, or urinal?: A Little ?Help from another person bathing (including washing, rinsing, drying)?: A Lot ?Help from another person to put on and taking off regular upper body clothing?: A Little ?Help from another person to put on and taking off regular lower body clothing?: A Lot ?6 Click Score: 16 ?  ?End of Session Nurse Communication: Mobility status ? ?Activity Tolerance: Patient tolerated treatment well ?Patient left: in bed;with bed alarm set;with call bell/phone within reach;Other (comment) (sitting EOB) ? ?  OT Visit Diagnosis: Unsteadiness on feet (R26.81);Low vision, both eyes (H54.2);Muscle weakness (generalized) (M62.81);History of falling (Z91.81);Other abnormalities of gait and mobility (R26.89)  ?              ?Time: 1000-1019 ?OT Time Calculation (min): 19 min ?Charges:  OT General Charges ?$OT Visit: 1  Visit ?OT Evaluation ?$OT Eval Moderate Complexity: 1 Mod ? ?Lynnda Child, OTD, OTR/L ?Acute Rehab ?[phone] ? ? ?Kaylyn Lim ?03/05/2022, 12:22 PM ?

## 2022-03-05 NOTE — TOC CAGE-AID Note (Signed)
Transition of Care (TOC) - CAGE-AID Screening ? ? ?Patient Details  ?Name: Beth Hubbard ?MRN: [id number] ?Date of Birth: 1946-01-16 ? ?Transition of Care (TOC) CM/SW Contact:    ?Audie Stayer C Tarpley-Carter, LCSWA ?Phone Number: ?03/05/2022, 3:43 PM ? ? ?Clinical Narrative: ?Pt participated in [place].  Pt stated she does not use substance or ETOH.  Pt was not offered resources, due to no usage of substance or ETOH.    ? ?Passenger transport manager, MSW, LCSW-A ?Pronouns:  She/Her/Hers ?[facility] of [facility] ?Clinical Social Worker ?Direct Number:  [phone] ?[email or web address] ? ?CAGE-AID Screening: ?  ? ?Have You Ever Felt You Ought to Cut Down on Your Drinking or Drug Use?: No ?Have People Annoyed You By Critizing Your Drinking Or Drug Use?: No ?Have You Felt Bad Or Guilty About Your Drinking Or Drug Use?: No ?Have You Ever Had a Drink or Used Drugs First Thing In The Morning to Steady Your Nerves or to Get Rid of a Hangover?: No ?CAGE-AID Score: 0 ? ?Substance Abuse Education Offered: No ? ?  ? ? ? ? ? ? ?

## 2022-03-05 NOTE — TOC Progression Note (Addendum)
Transition of Care (TOC) - Progression Note  ? ? ?Patient Details  ?Name: Beth Hubbard ?MRN: [id number] ?Date of Birth: 03-28-1946 ? ?Transition of Care (TOC) CM/SW Contact  ?Marilu Favre, RN ?Phone Number: ?03/05/2022, 3:56 PM ? ?Clinical Narrative:    ? ?Spoke to [place] via phone. Confirmed she does want to take her mother home at discharge and not SNF. PAtient will be staying at [facility] : [address] [place], [place] [address]  ? ?No preference in home health agencies. Levada Dy with [facility] reviewing referral.  ? ?[facility] unable to accept referral.  ? ?Asked Tommi Rumps with [facility] and he accepted  ?Patient needs walker and 3 in 1 for home, called [employer]  ? ?Expected Discharge Plan: [place] ?Barriers to Discharge: Continued Medical Work up ? ?Expected Discharge Plan and Services ?Expected Discharge Plan: [place] ?In-house Referral: Clinical Social Work ?  ?Post Acute Care Choice: Home Health ?Living arrangements for the past 2 months: [place] ?                ?  ?  ?  ?  ?  ?  ?  ?  ?  ?  ? ? ?Social Determinants of Health (SDOH) Interventions ?  ? ?Readmission Risk Interventions ?   ? View : No data to display.  ?  ?  ?  ? ? ?

## 2022-03-05 NOTE — Discharge Summary (Signed)
? ?DISCHARGE SUMMARY ? ?Beth Hubbard ? ?MR#: [id number] ? ?DOB:May 23, 1946  ?Date of Admission: 03/03/2022 ?Date of Discharge: 03/05/2022 ? ?Attending Physician:Ninetta Adelstein Hennie Duos, MD ? ?Patient's LPF:XTKW, Diona Foley, NP ? ?Consults: Ophthalmology  ? ?Disposition: D/C home  ? ?Follow-up Appts: ? Follow-up Information   ? ? Vevelyn Francois, NP Follow up in 1 week(s).   ?Specialty: Adult Health Nurse Practitioner ?Contact information: ?[address] ?[place] [address] ?[phone] ? ? ?  ?  ? ? Bretta Bang, MD Follow up in 10 day(s).   ?Specialty: Ophthalmology ?Contact information: ?[address] ?[place] [address] ?[phone] ? ? ?  ?  ? ?  ?  ? ?  ? ? ?Discharge Diagnoses: ?Right displaced orbital fracture after mechanical fall ?Mechanical fall ?Hypokalemia ?DM2 ?HLD ?HTN ?OSA ? ?Initial presentation: ?76 year old with history of DM2, glaucoma, HLD, HTN, OSA on CPAP, and TIA who presented to the ED after suffering a mechanical fall at home in which she suffered significant facial trauma.  She requires use of a walker at home due to OA but is also on a narcotic and gabapentin and her daughter feels that this contributes to her falling.  In the ED CT cervical spine revealed no acute fracture or subluxation.  CT of the of the head and face revealed an acute displaced fracture involving the floor of the right orbit and a possible area of bleeding adjacent to the posterior lateral aspect of the right globe.  Also noted was significant right maxillary sinus blood. ? ?Hospital Course: ? ?Right displaced orbital fracture after mechanical fall ?Ophthalmology evaluated and fortunately no acute intervention was required - to f/u in outpt setting - pt is instructed to f/u w/ Ophthalmology as outpt in 1-2 weeks - no nose blowing or use of CPAP for 10 days  ?  ?Mechanical fall ?Likely primarily age-related debility -PT/OT suggested SNF for rehab but pt prefers d/c home - HHPT/OT and DME arranged at time  of d/c - daughter to be with patient to assist her  ?  ?Hypokalemia ?Corrected w/ supplementation  ?  ?DM2 ?CBG well controlled ?  ?HLD ?Continue usual home statin ?  ?HTN ?Continue usual dose of amlodipine ?  ?OSA ?Holding on CPAP for 10 days given facial fracture/fascial distortion with edema ?  ? ?Allergies as of 03/05/2022   ? ?   Reactions  ? Aspirin   ? Tremor  ? Penicillins Itching, Other (See Comments)  ? Reaction: itching,headache, dizziness and anxiety. Feels like she is "goin off" ?Did it involve swelling of the face/tongue/throat, SOB, or low BP? No ?Did it involve sudden or severe rash/hives, skin peeling, or any reaction on the inside of your mouth or nose? No ?Did you need to seek medical attention at a hospital or doctor's office? No ?When did it last happen?      Childhood ?If all above answers are ?NO?, may proceed with cephalosporin use.  ? Nsaids Anxiety  ? Reaction: causes dizziness and headache. Ibuprofen. Aspirin  ? ?  ? ?  ?Medication List  ?  ? ?TAKE these medications   ? ?Accu-Chek Aviva Plus test strip ?Generic drug: glucose blood ?Reported on 02/24/2016 ?  ?Accu-Chek Aviva Plus w/Device Kit ?1 kit by Does not apply route as directed. Check 1 to 2 times daily ?  ?Accu-Chek Softclix Lancets lancets ?Check BS once or twice a day. ?  ?acetaminophen 325 MG tablet ?Commonly known as: TYLENOL ?Take 650 mg by  mouth every 6 (six) hours as needed for moderate pain or headache. ?  ?amLODipine 5 MG tablet ?Commonly known as: NORVASC ?TAKE 1 TABLET(5 MG) BY MOUTH DAILY ?What changed: See the new instructions. ?  ?Blood Pressure Monitor Kit ?1 kit by Does not apply route daily. ?  ?cycloSPORINE 0.05 % ophthalmic emulsion ?Commonly known as: RESTASIS ?Place 1 drop into both eyes 2 (two) times daily. ?  ?dorzolamide 2 % ophthalmic solution ?Commonly known as: TRUSOPT ?Place 1 drop into the left eye 2 (two) times daily. ?  ?ferrous sulfate 325 (65 FE) MG tablet ?Take 325 mg by mouth daily. ?  ?Fish Oil 1000  MG Caps ?Take 1,000 mg by mouth daily. ?  ?gabapentin 300 MG capsule ?Commonly known as: NEURONTIN ?TAKE 1 CAPSULE BY MOUTH EVERY NIGHT AT BEDTIME ?What changed:  ?how much to take ?how to take this ?when to take this ?additional instructions ?  ?ondansetron 4 MG disintegrating tablet ?Commonly known as: ZOFRAN-ODT ?Take 1 tablet (4 mg total) by mouth every 8 (eight) hours as needed for nausea or vomiting. ?  ?oxyCODONE-acetaminophen 5-325 MG tablet ?Commonly known as: PERCOCET/ROXICET ?Take 1 tablet by mouth every 8 (eight) hours as needed for moderate pain. ?  ?Rocklatan 0.02-0.005 % Soln ?Generic drug: Netarsudil-Latanoprost ?Place 1 drop into both eyes at bedtime. ?  ?rosuvastatin 20 MG tablet ?Commonly known as: CRESTOR ?TAKE 1 TABLET(20 MG) BY MOUTH DAILY ?What changed: See the new instructions. ?  ?sitaGLIPtin 50 MG tablet ?Commonly known as: Januvia ?TAKE 1 TABLET(50 MG) BY MOUTH DAILY ?What changed:  ?how much to take ?how to take this ?when to take this ?additional instructions ?  ?tiZANidine 4 MG tablet ?Commonly known as: ZANAFLEX ?Take 4 mg by mouth 3 (three) times daily as needed for muscle spasms. ?  ?traZODone 50 MG tablet ?Commonly known as: DESYREL ?Take 50 mg by mouth at bedtime as needed for sleep. ?  ? ?  ? ?  ?  ? ? ?  ?Durable Medical Equipment  ?(From admission, onward)  ?  ? ? ?  ? ?  Start     Ordered  ? 03/05/22 1551  For home use only DME Walker rolling  Once       ?Question Answer Comment  ?Walker: With 5 Inch Wheels   ?Patient needs a walker to treat with the following condition Weakness   ?  ? 03/05/22 1550  ? 03/05/22 1550  For home use only DME 3 n 1  Once       ? 03/05/22 1550  ? ?  ?  ? ?  ? ? ?Day of Discharge ?BP (!) 106/55 (BP Location: Right Arm)   Pulse 81   Temp 99.7 ?F (37.6 ?C) (Oral)   Resp 18   Ht 5' 4"  (1.626 m)   Wt 68.9 kg   SpO2 95%   BMI 26.07 kg/m?  ? ?Physical Exam: ?General: No acute respiratory distress ?Lungs: Clear to auscultation bilaterally without  wheezes or crackles ?Cardiovascular: Regular rate and rhythm without murmur gallop or rub normal S1 and S2 ?Abdomen: Nontender, nondistended, soft, bowel sounds positive, no rebound, no ascites, no appreciable mass ?Extremities: No significant cyanosis, clubbing, or edema bilateral lower extremities ? ?Basic Metabolic Panel: ?Recent Labs  ?Lab 03/03/22 ?2235 03/04/22 ?0645 03/05/22 ?0138  ?NA 138 144 139  ?K 2.9* 3.4* 3.8  ?CL 102 105 100  ?CO2 29 33* 31  ?GLUCOSE 178* 130* 128*  ?BUN 11 7* 8  ?CREATININE  0.92 0.75 0.83  ?CALCIUM 8.8* 9.3 9.3  ?MG  --  1.8 2.2  ? ? ?Liver Function Tests: ?Recent Labs  ?Lab 03/03/22 ?2235  ?AST 35  ?ALT 25  ?ALKPHOS 42  ?BILITOT 0.5  ?PROT 6.7  ?ALBUMIN 3.5  ? ?CBC: ?Recent Labs  ?Lab 03/03/22 ?2235 03/04/22 ?0645 03/05/22 ?0138  ?WBC 7.2 15.2* 10.4  ?NEUTROABS 4.4  --   --   ?HGB 10.2* 12.0 11.5*  ?HCT 32.5* 37.5 35.1*  ?MCV 100.6* 100.8* 97.8  ?PLT 247 272 258  ? ? ?Time spent in discharge (includes decision making & examination of pt): ?30 minutes ? ?03/05/2022, 4:13 PM  ? ?Cherene Altes, MD ?[facility] ?Office  [phone] ? ? ? ? ? ?

## 2022-03-05 NOTE — Progress Notes (Signed)
0700 - Report received from [place], Therapist, sports.  All questions answered.  Assisted pt to bathroom. ?0945 - Assessment/Rx.  Breakfast ordered.  Pt C/O pain to R eye.  Pt noted to have buildup of dried blood between eyelids.  Pt unable to fully open eye d/t dried blood.  Unable to clean blood out of pt's eye d/t pain. ?[place], OT worked with pt. ?1100 - Report given to RN.  All questions answered. ?

## 2022-03-05 NOTE — Evaluation (Signed)
Physical Therapy Evaluation ?Patient Details ?Name: Beth Hubbard ?MRN: [id number] ?DOB: 30-Mar-1946 ?Today's Date: 03/05/2022 ? ?History of Present Illness ? Pt is a 76 y.o. female admitted 4/15 following a fall at home hitting her head. She sustained right orbital floor fracture.  PMH: DMII, glaucoma, high cholesterol, HTN, OSA on CPAP, TIA ?  ?Clinical Impression ? Pt admitted with above diagnosis. PTA pt lived at home alone in first floor apartment, mod I mobility with cane.  Pt currently with functional limitations due to the deficits listed below (see PT Problem List). On eval, pt required min assist bed mobility, min assist transfers, and min/HHA ambulation 20'. Pt presents with decreased balance and shuffle gait. Facial pain limiting her activity tolerance. Pt will benefit from skilled PT to increase their independence and safety with mobility to allow discharge to the venue listed below.  If family able to provide 24-hour assist, pt may be able to d/c home with HHPT. PT to further assess possible need for RW.  ?   ?   ? ?Recommendations for follow up therapy are one component of a multi-disciplinary discharge planning process, led by the attending physician.  Recommendations may be updated based on patient status, additional functional criteria and insurance authorization. ? ?Follow Up Recommendations Skilled nursing-short term rehab (<3 hours/day) ? ?  ?Assistance Recommended at Discharge Frequent or constant Supervision/Assistance  ?Patient can return home with the following ? A little help with walking and/or transfers;Assistance with cooking/housework;Assist for transportation;Direct supervision/assist for medications management;A little help with bathing/dressing/bathroom ? ?  ?Equipment Recommendations Other (comment) (assess need for RW)  ?Recommendations for Other Services ?    ?  ?Functional Status Assessment Patient has had a recent decline in their functional status and demonstrates the ability to  make significant improvements in function in a reasonable and predictable amount of time.  ? ?  ?Precautions / Restrictions Precautions ?Precautions: Fall ?Restrictions ?Weight Bearing Restrictions: No  ? ?  ? ?Mobility ? Bed Mobility ?Overal bed mobility: Needs Assistance ?Bed Mobility: Supine to Sit ?  ?  ?Supine to sit: Min assist ?  ?  ?General bed mobility comments: +rail, increased time, assist to elevate trunk ?  ? ?Transfers ?Overall transfer level: Needs assistance ?Equipment used: 1 person hand held assist ?Transfers: Sit to/from Stand ?Sit to Stand: Min assist ?  ?  ?  ?  ?  ?General transfer comment: increased time to stabilize balance ?  ? ?Ambulation/Gait ?Ambulation/Gait assistance: Min assist ?Gait Distance (Feet): 20 Feet ?Assistive device: 1 person hand held assist ?Gait Pattern/deviations: Step-through pattern, Decreased stride length, Shuffle ?Gait velocity: decreased ?  ?  ?General Gait Details: Unsteady gait. HHA on R to simulate cane. Distance limited by pain. ? ?Stairs ?  ?  ?  ?  ?  ? ?Wheelchair Mobility ?  ? ?Modified Rankin (Stroke Patients Only) ?  ? ?  ? ?Balance Overall balance assessment: Needs assistance ?Sitting-balance support: No upper extremity supported, Feet supported ?Sitting balance-Leahy Scale: Good ?  ?  ?Standing balance support: Single extremity supported, During functional activity ?Standing balance-Leahy Scale: Poor ?Standing balance comment: reliant on external support ?  ?  ?  ?  ?  ?  ?  ?  ?  ?  ?  ?   ? ? ? ?Pertinent Vitals/Pain Pain Assessment ?Pain Assessment: Faces ?Faces Pain Scale: Hurts whole lot ?Pain Location: R eye/facial area ?Pain Descriptors / Indicators: Constant, Discomfort, Grimacing, Guarding ?Pain Intervention(s): Limited activity within patient's tolerance,  Repositioned, Patient requesting pain meds-RN notified  ? ? ?Home Living Family/patient expects to be discharged to:: Private residence ?Living Arrangements: Alone ?Available Help at  Discharge: Neighbor;Available PRN/intermittently;Family ?Type of Home: Apartment ?Home Access: Level entry ?  ?  ?  ?Home Layout: One level ?Home Equipment: Kasandra Knudsen - single point ?   ?  ?Prior Function Prior Level of Function : Independent/Modified Independent ?  ?  ?  ?  ?  ?  ?Mobility Comments: uses cane ?ADLs Comments: does IADLs ?  ? ? ?Hand Dominance  ?   ? ?  ?Extremity/Trunk Assessment  ? Upper Extremity Assessment ?Upper Extremity Assessment: Defer to OT evaluation ?  ? ?Lower Extremity Assessment ?Lower Extremity Assessment: Overall WFL for tasks assessed ?  ? ?Cervical / Trunk Assessment ?Cervical / Trunk Assessment: Normal  ?Communication  ? Communication: No difficulties  ?Cognition Arousal/Alertness: Awake/alert ?Behavior During Therapy: [facility] for tasks assessed/performed ?Overall Cognitive Status: No family/caregiver present to determine baseline cognitive functioning ?Area of Impairment: Safety/judgement ?  ?  ?  ?  ?  ?  ?  ?  ?  ?  ?  ?  ?Safety/Judgement: Decreased awareness of deficits, Decreased awareness of safety ?  ?  ?  ?  ?  ? ?  ?General Comments   ? ?  ?Exercises    ? ?Assessment/Plan  ?  ?PT Assessment Patient needs continued PT services  ?PT Problem List Decreased mobility;Decreased safety awareness;Decreased knowledge of precautions;Decreased activity tolerance;Pain;Decreased balance;Decreased knowledge of use of DME ? ?   ?  ?PT Treatment Interventions DME instruction;Therapeutic activities;Gait training;Therapeutic exercise;Patient/family education;Balance training;Functional mobility training   ? ?PT Goals (Current goals can be found in the Care Plan section)  ?Acute Rehab PT Goals ?Patient Stated Goal: home ?PT Goal Formulation: With patient ?Time For Goal Achievement: 03/19/22 ?Potential to Achieve Goals: Good ? ?  ?Frequency Min 3X/week ?  ? ? ?Co-evaluation   ?  ?  ?  ?  ? ? ?  ?AM-PAC PT "6 Clicks" Mobility  ?Outcome Measure Help needed turning from your back to your side while in  a flat bed without using bedrails?: A Little ?Help needed moving from lying on your back to sitting on the side of a flat bed without using bedrails?: A Little ?Help needed moving to and from a bed to a chair (including a wheelchair)?: A Little ?Help needed standing up from a chair using your arms (e.g., wheelchair or bedside chair)?: A Little ?Help needed to walk in hospital room?: A Little ?Help needed climbing 3-5 steps with a railing? : A Lot ?6 Click Score: 17 ? ?  ?End of Session Equipment Utilized During Treatment: Gait belt ?Activity Tolerance: Patient limited by pain ?Patient left: in chair;with call bell/phone within reach;with chair alarm set ?Nurse Communication: Patient requests pain meds;Mobility status ?PT Visit Diagnosis: Unsteadiness on feet (R26.81);Difficulty in walking, not elsewhere classified (R26.2) ?  ? ?Time: 3846-6599 ?PT Time Calculation (min) (ACUTE ONLY): 21 min ? ? ?Charges:   PT Evaluation ?$PT Eval Moderate Complexity: 1 Mod ?  ?  ?   ? ? ?Lorrin Goodell, PT  ?Office # [phone] ?Pager [phone] ? ? ?Lorriane Shire ?03/05/2022, 12:04 PM ? ?

## 2022-03-09 ENCOUNTER — Ambulatory Visit: Payer: Medicare Other | Admitting: Nurse Practitioner

## 2022-03-09 ENCOUNTER — Ambulatory Visit (INDEPENDENT_AMBULATORY_CARE_PROVIDER_SITE_OTHER): Payer: Medicare Other | Admitting: Nurse Practitioner

## 2022-03-09 VITALS — BP 146/68 | HR 90 | Temp 98.0°F | Ht 64.0 in | Wt 149.2 lb

## 2022-03-09 DIAGNOSIS — Z7952 Long term (current) use of systemic steroids: Secondary | ICD-10-CM | POA: Diagnosis not present

## 2022-03-09 DIAGNOSIS — Z8673 Personal history of transient ischemic attack (TIA), and cerebral infarction without residual deficits: Secondary | ICD-10-CM | POA: Diagnosis not present

## 2022-03-09 DIAGNOSIS — G8929 Other chronic pain: Secondary | ICD-10-CM | POA: Diagnosis not present

## 2022-03-09 DIAGNOSIS — R413 Other amnesia: Secondary | ICD-10-CM | POA: Diagnosis not present

## 2022-03-09 DIAGNOSIS — W19XXXD Unspecified fall, subsequent encounter: Secondary | ICD-10-CM | POA: Diagnosis not present

## 2022-03-09 DIAGNOSIS — E7800 Pure hypercholesterolemia, unspecified: Secondary | ICD-10-CM | POA: Diagnosis not present

## 2022-03-09 DIAGNOSIS — Z79899 Other long term (current) drug therapy: Secondary | ICD-10-CM

## 2022-03-09 DIAGNOSIS — S0231XD Fracture of orbital floor, right side, subsequent encounter for fracture with routine healing: Secondary | ICD-10-CM | POA: Diagnosis not present

## 2022-03-09 DIAGNOSIS — M47812 Spondylosis without myelopathy or radiculopathy, cervical region: Secondary | ICD-10-CM | POA: Diagnosis not present

## 2022-03-09 DIAGNOSIS — M1990 Unspecified osteoarthritis, unspecified site: Secondary | ICD-10-CM | POA: Diagnosis not present

## 2022-03-09 DIAGNOSIS — I051 Rheumatic mitral insufficiency: Secondary | ICD-10-CM | POA: Diagnosis not present

## 2022-03-09 DIAGNOSIS — G4733 Obstructive sleep apnea (adult) (pediatric): Secondary | ICD-10-CM | POA: Diagnosis not present

## 2022-03-09 DIAGNOSIS — E119 Type 2 diabetes mellitus without complications: Secondary | ICD-10-CM | POA: Diagnosis not present

## 2022-03-09 DIAGNOSIS — I10 Essential (primary) hypertension: Secondary | ICD-10-CM

## 2022-03-09 DIAGNOSIS — G319 Degenerative disease of nervous system, unspecified: Secondary | ICD-10-CM | POA: Diagnosis not present

## 2022-03-09 DIAGNOSIS — M4802 Spinal stenosis, cervical region: Secondary | ICD-10-CM | POA: Diagnosis not present

## 2022-03-09 DIAGNOSIS — G4700 Insomnia, unspecified: Secondary | ICD-10-CM | POA: Diagnosis not present

## 2022-03-09 DIAGNOSIS — H409 Unspecified glaucoma: Secondary | ICD-10-CM | POA: Diagnosis not present

## 2022-03-09 DIAGNOSIS — Z9181 History of falling: Secondary | ICD-10-CM | POA: Diagnosis not present

## 2022-03-09 DIAGNOSIS — Z7984 Long term (current) use of oral hypoglycemic drugs: Secondary | ICD-10-CM | POA: Diagnosis not present

## 2022-03-09 MED ORDER — AMLODIPINE BESYLATE 5 MG PO TABS: 10.0000 mg | ORAL_TABLET | Freq: Every day | ORAL | 0 refills | Status: DC

## 2022-03-09 NOTE — Progress Notes (Signed)
? ?[facility] ?[place], [place]  [address] ?Phone:  [phone]   Fax:  [phone] ?Subjective:  ? Patient ID: Beth Hubbard, female    DOB: 04-16-1946, 76 y.o.   MRN: [id number] ? ?Chief Complaint  ?Patient presents with  ? Follow-up  ? Hospitalization Follow-up  ?  Pt is here for hosptial follow up. Pt stated she is in pain level 6 her head and eye hurts  ? ?HPI ?Beth Hubbard 76 y.o. female  has a past medical history of Arthritis, Diabetes mellitus without complication ([place]), Excessive daytime sleepiness, Glaucoma, High cholesterol, Hypertension, Mitral regurgitation, OSA (obstructive sleep apnea) (10/18/2015), Pneumonia, Shingles, and TIA (transient ischemic attack). To the [facility] for hospital follow up. ? ?Patient went to the ED on 4/15 for fall, was admitted to hospital for three days. Daughter at bedside states that patient fell while using a walker and hit the wall. Injury to right eye and face, no other sustained injuries. Family denies any falls since being discharged. Patient currently resides with daughter, but prior to fall she lived alone. Family suspects fall related to combination of drugs given for back pain related to fall in 1998 and sleep aides. Family is also concerned patient has been taking too many medications. ? ?Questioned about elevated B/P, patient states that she has not taken her medication this morning. Indicates that B/P is also high at home when she takes her medications. B/P range at home 150-160/?. Family requesting reevaluation of B/P medications. ? ?Patient indicates that she has had a decreased appetite and only eats in small amounts. ? ?With regard to injury to right eye, patient has follow up appointment with opthalmology in 4-5 days. ? ?Patient currently compliant with all medications including prescribed Januvia. Uncertain of BG at home. Has not been very active since fall, will be starting PT in the upcoming week.  ? ?Denies any  other concerns today. Denies any fatigue, chest pain, shortness of breath, HA or dizziness. Denies any  numbness or tingling. ? ? ?Past Medical History:  ?Diagnosis Date  ? Arthritis   ? Diabetes mellitus without complication ([place])   ? Excessive daytime sleepiness   ? Glaucoma   ? High cholesterol   ? Hypertension   ? Mitral regurgitation   ? mild by echo 03/2017  ? OSA (obstructive sleep apnea) 10/18/2015  ? Mild OSA with AHI 12.5/hr on CPAP at 8cm H2O  ? Pneumonia   ? Shingles   ? TIA (transient ischemic attack)   ? ? ?Past Surgical History:  ?Procedure Laterality Date  ? ABDOMINAL SURGERY    ? EYE SURGERY Bilateral   ? cataract surgery  ? ROTATOR CUFF REPAIR    ? bilateral  ? TEE WITHOUT CARDIOVERSION N/A 07/20/2016  ? Procedure: TRANSESOPHAGEAL ECHOCARDIOGRAM (TEE);  Surgeon: Pixie Casino, MD;  Location: [facility];  Service: Cardiovascular;  Laterality: N/A;  ? TOOTH EXTRACTION Left 10/09/2019  ? Procedure: DENTAL EXTRACTION X1 and Irrigation and Debridement.;  Surgeon: Diona Browner, DDS;  Location: [facility];  Service: Oral Surgery;  Laterality: Left;  DENTAL EXTRACTION X1 and Irrigation and Debridement.  ? ? ?Family History  ?Problem Relation Age of Onset  ? Cancer Father   ? Cancer Sister   ? Cancer Brother   ? Breast cancer Neg Hx   ? ? ?Social History  ? ?Socioeconomic History  ? Marital status: Legally Separated  ?  Spouse name: Not on file  ? Number of  children: Not on file  ? Years of education: Not on file  ? Highest education level: Not on file  ?Occupational History  ? Not on file  ?Tobacco Use  ? Smoking status: Never  ? Smokeless tobacco: Never  ?Vaping Use  ? Vaping Use: Never used  ?Substance and Sexual Activity  ? Alcohol use: No  ? Drug use: No  ? Sexual activity: Not Currently  ?Other Topics Concern  ? Not on file  ?Social History Narrative  ? Not on file  ? ?Social Determinants of Health  ? ?Financial Resource Strain: Low Risk   ? Difficulty of Paying Living Expenses: Not hard at all  ?Food  Insecurity: Food Insecurity Present  ? Worried About Charity fundraiser in the Last Year: Never true  ? Ran Out of Food in the Last Year: Sometimes true  ?Transportation Needs: No Transportation Needs  ? Lack of Transportation (Medical): No  ? Lack of Transportation (Non-Medical): No  ?Physical Activity: Sufficiently Active  ? Days of Exercise per Week: 7 days  ? Minutes of Exercise per Session: 150+ min  ?Stress: No Stress Concern Present  ? Feeling of Stress : Only a little  ?Social Connections: Moderately Integrated  ? Frequency of Communication with Friends and Family: More than three times a week  ? Frequency of Social Gatherings with Friends and Family: Once a week  ? Attends Religious Services: More than 4 times per year  ? Active Member of Clubs or Organizations: Yes  ? Attends Archivist Meetings: More than 4 times per year  ? Marital Status: Separated  ?Intimate Partner Violence: Not At Risk  ? Fear of Current or Ex-Partner: No  ? Emotionally Abused: No  ? Physically Abused: No  ? Sexually Abused: No  ? ? ?Outpatient Medications Prior to Visit  ?Medication Sig Dispense Refill  ? ACCU-CHEK AVIVA PLUS test strip Reported on 02/24/2016 100 each 1  ? Accu-Chek Softclix Lancets lancets Check BS once or twice a day. 100 each 1  ? acetaminophen (TYLENOL) 325 MG tablet Take 650 mg by mouth every 6 (six) hours as needed for moderate pain or headache.    ? Blood Glucose Monitoring Suppl (ACCU-CHEK AVIVA PLUS) w/Device KIT 1 kit by Does not apply route as directed. Check 1 to 2 times daily 1 kit 0  ? Blood Pressure Monitor KIT 1 kit by Does not apply route daily. 1 kit 0  ? cycloSPORINE (RESTASIS) 0.05 % ophthalmic emulsion Place 1 drop into both eyes 2 (two) times daily.    ? dorzolamide (TRUSOPT) 2 % ophthalmic solution Place 1 drop into the left eye 2 (two) times daily.    ? ferrous sulfate 325 (65 FE) MG tablet Take 325 mg by mouth daily.     ? Omega-3 Fatty Acids (FISH OIL) 1000 MG CAPS Take 1,000 mg by  mouth daily.    ? ondansetron (ZOFRAN-ODT) 4 MG disintegrating tablet Take 1 tablet (4 mg total) by mouth every 8 (eight) hours as needed for nausea or vomiting. 10 tablet 0  ? oxyCODONE-acetaminophen (PERCOCET/ROXICET) 5-325 MG tablet Take 1 tablet by mouth every 8 (eight) hours as needed for moderate pain.    ? ROCKLATAN 0.02-0.005 % SOLN Place 1 drop into both eyes at bedtime.    ? rosuvastatin (CRESTOR) 20 MG tablet TAKE 1 TABLET(20 MG) BY MOUTH DAILY (Patient taking differently: Take 20 mg by mouth daily.) 90 tablet 0  ? sitaGLIPtin (JANUVIA) 50 MG tablet TAKE 1  TABLET(50 MG) BY MOUTH DAILY (Patient taking differently: Take 50 mg by mouth daily.) 90 tablet 3  ? tiZANidine (ZANAFLEX) 4 MG tablet Take 4 mg by mouth 3 (three) times daily as needed for muscle spasms.    ? traZODone (DESYREL) 50 MG tablet Take 50 mg by mouth at bedtime as needed for sleep.    ? amLODipine (NORVASC) 5 MG tablet TAKE 1 TABLET(5 MG) BY MOUTH DAILY (Patient taking differently: Take 5 mg by mouth daily.) 90 tablet 0  ? gabapentin (NEURONTIN) 300 MG capsule TAKE 1 CAPSULE BY MOUTH EVERY NIGHT AT BEDTIME (Patient not taking: Reported on 03/09/2022) 90 capsule 3  ? ?No facility-administered medications prior to visit.  ? ? ?Allergies  ?Allergen Reactions  ? Aspirin   ?  Tremor ?  ? Penicillins Itching and Other (See Comments)  ?  Reaction: itching,headache, dizziness and anxiety. Feels like she is "goin off" ?Did it involve swelling of the face/tongue/throat, SOB, or low BP? No ?Did it involve sudden or severe rash/hives, skin peeling, or any reaction on the inside of your mouth or nose? No ?Did you need to seek medical attention at a hospital or doctor's office? No ?When did it last happen?      Childhood ?If all above answers are ?NO?, may proceed with cephalosporin use.  ? Nsaids Anxiety  ?  Reaction: causes dizziness and headache. Ibuprofen. Aspirin  ? ? ?Review of Systems  ?Constitutional:  Negative for chills, fever and  malaise/fatigue.  ?     Currently utilizes a walker to assist with ambulation  ?Eyes:  Positive for blurred vision, double vision and redness. Negative for pain and discharge.  ?Respiratory:  Negative for cough and short

## 2022-03-09 NOTE — Patient Instructions (Signed)
You were seen today in the [facility] for follow up visit.  You were prescribed medications, please take as directed. Please follow up in 1 mth for reevaluation of HTN, sooner as needed.   ?

## 2022-03-12 ENCOUNTER — Encounter: Payer: Self-pay | Admitting: Nurse Practitioner

## 2022-03-13 DIAGNOSIS — S0231XA Fracture of orbital floor, right side, initial encounter for closed fracture: Secondary | ICD-10-CM | POA: Diagnosis not present

## 2022-03-14 DIAGNOSIS — H409 Unspecified glaucoma: Secondary | ICD-10-CM | POA: Diagnosis not present

## 2022-03-14 DIAGNOSIS — G319 Degenerative disease of nervous system, unspecified: Secondary | ICD-10-CM | POA: Diagnosis not present

## 2022-03-14 DIAGNOSIS — E119 Type 2 diabetes mellitus without complications: Secondary | ICD-10-CM | POA: Diagnosis not present

## 2022-03-14 DIAGNOSIS — Z8673 Personal history of transient ischemic attack (TIA), and cerebral infarction without residual deficits: Secondary | ICD-10-CM | POA: Diagnosis not present

## 2022-03-14 DIAGNOSIS — I10 Essential (primary) hypertension: Secondary | ICD-10-CM | POA: Diagnosis not present

## 2022-03-14 DIAGNOSIS — S0231XD Fracture of orbital floor, right side, subsequent encounter for fracture with routine healing: Secondary | ICD-10-CM | POA: Diagnosis not present

## 2022-03-14 DIAGNOSIS — Z7952 Long term (current) use of systemic steroids: Secondary | ICD-10-CM | POA: Diagnosis not present

## 2022-03-14 DIAGNOSIS — M1990 Unspecified osteoarthritis, unspecified site: Secondary | ICD-10-CM | POA: Diagnosis not present

## 2022-03-14 DIAGNOSIS — G4700 Insomnia, unspecified: Secondary | ICD-10-CM | POA: Diagnosis not present

## 2022-03-14 DIAGNOSIS — G8929 Other chronic pain: Secondary | ICD-10-CM | POA: Diagnosis not present

## 2022-03-14 DIAGNOSIS — M4802 Spinal stenosis, cervical region: Secondary | ICD-10-CM | POA: Diagnosis not present

## 2022-03-14 DIAGNOSIS — Z9181 History of falling: Secondary | ICD-10-CM | POA: Diagnosis not present

## 2022-03-14 DIAGNOSIS — E7800 Pure hypercholesterolemia, unspecified: Secondary | ICD-10-CM | POA: Diagnosis not present

## 2022-03-14 DIAGNOSIS — G4733 Obstructive sleep apnea (adult) (pediatric): Secondary | ICD-10-CM | POA: Diagnosis not present

## 2022-03-14 DIAGNOSIS — M47812 Spondylosis without myelopathy or radiculopathy, cervical region: Secondary | ICD-10-CM | POA: Diagnosis not present

## 2022-03-14 DIAGNOSIS — I051 Rheumatic mitral insufficiency: Secondary | ICD-10-CM | POA: Diagnosis not present

## 2022-03-14 DIAGNOSIS — Z7984 Long term (current) use of oral hypoglycemic drugs: Secondary | ICD-10-CM | POA: Diagnosis not present

## 2022-03-16 DIAGNOSIS — I051 Rheumatic mitral insufficiency: Secondary | ICD-10-CM | POA: Diagnosis not present

## 2022-03-16 DIAGNOSIS — I10 Essential (primary) hypertension: Secondary | ICD-10-CM | POA: Diagnosis not present

## 2022-03-16 DIAGNOSIS — M47812 Spondylosis without myelopathy or radiculopathy, cervical region: Secondary | ICD-10-CM | POA: Diagnosis not present

## 2022-03-16 DIAGNOSIS — H409 Unspecified glaucoma: Secondary | ICD-10-CM | POA: Diagnosis not present

## 2022-03-16 DIAGNOSIS — G319 Degenerative disease of nervous system, unspecified: Secondary | ICD-10-CM | POA: Diagnosis not present

## 2022-03-16 DIAGNOSIS — Z8673 Personal history of transient ischemic attack (TIA), and cerebral infarction without residual deficits: Secondary | ICD-10-CM | POA: Diagnosis not present

## 2022-03-16 DIAGNOSIS — Z9181 History of falling: Secondary | ICD-10-CM | POA: Diagnosis not present

## 2022-03-16 DIAGNOSIS — E119 Type 2 diabetes mellitus without complications: Secondary | ICD-10-CM | POA: Diagnosis not present

## 2022-03-16 DIAGNOSIS — E7800 Pure hypercholesterolemia, unspecified: Secondary | ICD-10-CM | POA: Diagnosis not present

## 2022-03-16 DIAGNOSIS — M1990 Unspecified osteoarthritis, unspecified site: Secondary | ICD-10-CM | POA: Diagnosis not present

## 2022-03-16 DIAGNOSIS — M4802 Spinal stenosis, cervical region: Secondary | ICD-10-CM | POA: Diagnosis not present

## 2022-03-16 DIAGNOSIS — Z7952 Long term (current) use of systemic steroids: Secondary | ICD-10-CM | POA: Diagnosis not present

## 2022-03-16 DIAGNOSIS — S0231XD Fracture of orbital floor, right side, subsequent encounter for fracture with routine healing: Secondary | ICD-10-CM | POA: Diagnosis not present

## 2022-03-16 DIAGNOSIS — G4700 Insomnia, unspecified: Secondary | ICD-10-CM | POA: Diagnosis not present

## 2022-03-16 DIAGNOSIS — G4733 Obstructive sleep apnea (adult) (pediatric): Secondary | ICD-10-CM | POA: Diagnosis not present

## 2022-03-16 DIAGNOSIS — Z7984 Long term (current) use of oral hypoglycemic drugs: Secondary | ICD-10-CM | POA: Diagnosis not present

## 2022-03-16 DIAGNOSIS — G8929 Other chronic pain: Secondary | ICD-10-CM | POA: Diagnosis not present

## 2022-03-20 DIAGNOSIS — S0285XG Fracture of orbit, unspecified, subsequent encounter for fracture with delayed healing: Secondary | ICD-10-CM | POA: Diagnosis not present

## 2022-03-20 DIAGNOSIS — Z01818 Encounter for other preprocedural examination: Secondary | ICD-10-CM | POA: Diagnosis not present

## 2022-03-21 DIAGNOSIS — G319 Degenerative disease of nervous system, unspecified: Secondary | ICD-10-CM | POA: Diagnosis not present

## 2022-03-21 DIAGNOSIS — G4733 Obstructive sleep apnea (adult) (pediatric): Secondary | ICD-10-CM | POA: Diagnosis not present

## 2022-03-21 DIAGNOSIS — E119 Type 2 diabetes mellitus without complications: Secondary | ICD-10-CM | POA: Diagnosis not present

## 2022-03-21 DIAGNOSIS — I051 Rheumatic mitral insufficiency: Secondary | ICD-10-CM | POA: Diagnosis not present

## 2022-03-21 DIAGNOSIS — E7800 Pure hypercholesterolemia, unspecified: Secondary | ICD-10-CM | POA: Diagnosis not present

## 2022-03-21 DIAGNOSIS — I10 Essential (primary) hypertension: Secondary | ICD-10-CM | POA: Diagnosis not present

## 2022-03-21 DIAGNOSIS — G4700 Insomnia, unspecified: Secondary | ICD-10-CM | POA: Diagnosis not present

## 2022-03-21 DIAGNOSIS — Z7984 Long term (current) use of oral hypoglycemic drugs: Secondary | ICD-10-CM | POA: Diagnosis not present

## 2022-03-21 DIAGNOSIS — M1990 Unspecified osteoarthritis, unspecified site: Secondary | ICD-10-CM | POA: Diagnosis not present

## 2022-03-21 DIAGNOSIS — M47812 Spondylosis without myelopathy or radiculopathy, cervical region: Secondary | ICD-10-CM | POA: Diagnosis not present

## 2022-03-21 DIAGNOSIS — Z9181 History of falling: Secondary | ICD-10-CM | POA: Diagnosis not present

## 2022-03-21 DIAGNOSIS — S0231XD Fracture of orbital floor, right side, subsequent encounter for fracture with routine healing: Secondary | ICD-10-CM | POA: Diagnosis not present

## 2022-03-21 DIAGNOSIS — Z7952 Long term (current) use of systemic steroids: Secondary | ICD-10-CM | POA: Diagnosis not present

## 2022-03-21 DIAGNOSIS — Z8673 Personal history of transient ischemic attack (TIA), and cerebral infarction without residual deficits: Secondary | ICD-10-CM | POA: Diagnosis not present

## 2022-03-21 DIAGNOSIS — M4802 Spinal stenosis, cervical region: Secondary | ICD-10-CM | POA: Diagnosis not present

## 2022-03-21 DIAGNOSIS — G8929 Other chronic pain: Secondary | ICD-10-CM | POA: Diagnosis not present

## 2022-03-21 DIAGNOSIS — H409 Unspecified glaucoma: Secondary | ICD-10-CM | POA: Diagnosis not present

## 2022-03-22 ENCOUNTER — Ambulatory Visit: Payer: Medicare Other | Admitting: Nurse Practitioner

## 2022-03-22 DIAGNOSIS — I10 Essential (primary) hypertension: Secondary | ICD-10-CM | POA: Diagnosis not present

## 2022-03-22 DIAGNOSIS — M4802 Spinal stenosis, cervical region: Secondary | ICD-10-CM | POA: Diagnosis not present

## 2022-03-22 DIAGNOSIS — E7800 Pure hypercholesterolemia, unspecified: Secondary | ICD-10-CM | POA: Diagnosis not present

## 2022-03-22 DIAGNOSIS — G8929 Other chronic pain: Secondary | ICD-10-CM | POA: Diagnosis not present

## 2022-03-22 DIAGNOSIS — G319 Degenerative disease of nervous system, unspecified: Secondary | ICD-10-CM | POA: Diagnosis not present

## 2022-03-22 DIAGNOSIS — Z7984 Long term (current) use of oral hypoglycemic drugs: Secondary | ICD-10-CM | POA: Diagnosis not present

## 2022-03-22 DIAGNOSIS — G4700 Insomnia, unspecified: Secondary | ICD-10-CM | POA: Diagnosis not present

## 2022-03-22 DIAGNOSIS — M47812 Spondylosis without myelopathy or radiculopathy, cervical region: Secondary | ICD-10-CM | POA: Diagnosis not present

## 2022-03-22 DIAGNOSIS — I051 Rheumatic mitral insufficiency: Secondary | ICD-10-CM | POA: Diagnosis not present

## 2022-03-22 DIAGNOSIS — H409 Unspecified glaucoma: Secondary | ICD-10-CM | POA: Diagnosis not present

## 2022-03-22 DIAGNOSIS — Z9181 History of falling: Secondary | ICD-10-CM | POA: Diagnosis not present

## 2022-03-22 DIAGNOSIS — Z7952 Long term (current) use of systemic steroids: Secondary | ICD-10-CM | POA: Diagnosis not present

## 2022-03-22 DIAGNOSIS — Z8673 Personal history of transient ischemic attack (TIA), and cerebral infarction without residual deficits: Secondary | ICD-10-CM | POA: Diagnosis not present

## 2022-03-22 DIAGNOSIS — M1990 Unspecified osteoarthritis, unspecified site: Secondary | ICD-10-CM | POA: Diagnosis not present

## 2022-03-22 DIAGNOSIS — S0231XD Fracture of orbital floor, right side, subsequent encounter for fracture with routine healing: Secondary | ICD-10-CM | POA: Diagnosis not present

## 2022-03-22 DIAGNOSIS — E119 Type 2 diabetes mellitus without complications: Secondary | ICD-10-CM | POA: Diagnosis not present

## 2022-03-22 DIAGNOSIS — G4733 Obstructive sleep apnea (adult) (pediatric): Secondary | ICD-10-CM | POA: Diagnosis not present

## 2022-03-23 ENCOUNTER — Ambulatory Visit: Payer: Medicare Other | Admitting: Nurse Practitioner

## 2022-03-26 DIAGNOSIS — I10 Essential (primary) hypertension: Secondary | ICD-10-CM | POA: Diagnosis not present

## 2022-03-26 DIAGNOSIS — G4733 Obstructive sleep apnea (adult) (pediatric): Secondary | ICD-10-CM | POA: Diagnosis not present

## 2022-03-26 DIAGNOSIS — I051 Rheumatic mitral insufficiency: Secondary | ICD-10-CM | POA: Diagnosis not present

## 2022-03-26 DIAGNOSIS — H409 Unspecified glaucoma: Secondary | ICD-10-CM | POA: Diagnosis not present

## 2022-03-26 DIAGNOSIS — E7800 Pure hypercholesterolemia, unspecified: Secondary | ICD-10-CM | POA: Diagnosis not present

## 2022-03-26 DIAGNOSIS — G8929 Other chronic pain: Secondary | ICD-10-CM | POA: Diagnosis not present

## 2022-03-26 DIAGNOSIS — M47812 Spondylosis without myelopathy or radiculopathy, cervical region: Secondary | ICD-10-CM | POA: Diagnosis not present

## 2022-03-26 DIAGNOSIS — Z7984 Long term (current) use of oral hypoglycemic drugs: Secondary | ICD-10-CM | POA: Diagnosis not present

## 2022-03-26 DIAGNOSIS — M4802 Spinal stenosis, cervical region: Secondary | ICD-10-CM | POA: Diagnosis not present

## 2022-03-26 DIAGNOSIS — M1990 Unspecified osteoarthritis, unspecified site: Secondary | ICD-10-CM | POA: Diagnosis not present

## 2022-03-26 DIAGNOSIS — E119 Type 2 diabetes mellitus without complications: Secondary | ICD-10-CM | POA: Diagnosis not present

## 2022-03-26 DIAGNOSIS — G319 Degenerative disease of nervous system, unspecified: Secondary | ICD-10-CM | POA: Diagnosis not present

## 2022-03-26 DIAGNOSIS — Z7952 Long term (current) use of systemic steroids: Secondary | ICD-10-CM | POA: Diagnosis not present

## 2022-03-26 DIAGNOSIS — Z9181 History of falling: Secondary | ICD-10-CM | POA: Diagnosis not present

## 2022-03-26 DIAGNOSIS — G4700 Insomnia, unspecified: Secondary | ICD-10-CM | POA: Diagnosis not present

## 2022-03-26 DIAGNOSIS — S0231XD Fracture of orbital floor, right side, subsequent encounter for fracture with routine healing: Secondary | ICD-10-CM | POA: Diagnosis not present

## 2022-03-26 DIAGNOSIS — Z8673 Personal history of transient ischemic attack (TIA), and cerebral infarction without residual deficits: Secondary | ICD-10-CM | POA: Diagnosis not present

## 2022-03-29 ENCOUNTER — Other Ambulatory Visit: Payer: Self-pay

## 2022-03-29 ENCOUNTER — Encounter (HOSPITAL_COMMUNITY): Payer: Self-pay | Admitting: Optometry

## 2022-03-29 DIAGNOSIS — I051 Rheumatic mitral insufficiency: Secondary | ICD-10-CM | POA: Diagnosis not present

## 2022-03-29 DIAGNOSIS — Z9181 History of falling: Secondary | ICD-10-CM | POA: Diagnosis not present

## 2022-03-29 DIAGNOSIS — Z7952 Long term (current) use of systemic steroids: Secondary | ICD-10-CM | POA: Diagnosis not present

## 2022-03-29 DIAGNOSIS — M1990 Unspecified osteoarthritis, unspecified site: Secondary | ICD-10-CM | POA: Diagnosis not present

## 2022-03-29 DIAGNOSIS — E119 Type 2 diabetes mellitus without complications: Secondary | ICD-10-CM | POA: Diagnosis not present

## 2022-03-29 DIAGNOSIS — M4802 Spinal stenosis, cervical region: Secondary | ICD-10-CM | POA: Diagnosis not present

## 2022-03-29 DIAGNOSIS — H409 Unspecified glaucoma: Secondary | ICD-10-CM | POA: Diagnosis not present

## 2022-03-29 DIAGNOSIS — I10 Essential (primary) hypertension: Secondary | ICD-10-CM | POA: Diagnosis not present

## 2022-03-29 DIAGNOSIS — G8929 Other chronic pain: Secondary | ICD-10-CM | POA: Diagnosis not present

## 2022-03-29 DIAGNOSIS — G894 Chronic pain syndrome: Secondary | ICD-10-CM | POA: Diagnosis not present

## 2022-03-29 DIAGNOSIS — Z7984 Long term (current) use of oral hypoglycemic drugs: Secondary | ICD-10-CM | POA: Diagnosis not present

## 2022-03-29 DIAGNOSIS — G4700 Insomnia, unspecified: Secondary | ICD-10-CM | POA: Diagnosis not present

## 2022-03-29 DIAGNOSIS — M47812 Spondylosis without myelopathy or radiculopathy, cervical region: Secondary | ICD-10-CM | POA: Diagnosis not present

## 2022-03-29 DIAGNOSIS — G319 Degenerative disease of nervous system, unspecified: Secondary | ICD-10-CM | POA: Diagnosis not present

## 2022-03-29 DIAGNOSIS — Z8673 Personal history of transient ischemic attack (TIA), and cerebral infarction without residual deficits: Secondary | ICD-10-CM | POA: Diagnosis not present

## 2022-03-29 DIAGNOSIS — E7800 Pure hypercholesterolemia, unspecified: Secondary | ICD-10-CM | POA: Diagnosis not present

## 2022-03-29 DIAGNOSIS — G4733 Obstructive sleep apnea (adult) (pediatric): Secondary | ICD-10-CM | POA: Diagnosis not present

## 2022-03-29 DIAGNOSIS — S0231XD Fracture of orbital floor, right side, subsequent encounter for fracture with routine healing: Secondary | ICD-10-CM | POA: Diagnosis not present

## 2022-03-29 NOTE — Progress Notes (Addendum)
PCP - Dionisio David, NP ?Cardiologist - Fransico Him, MD ? ?PPM/ICD - denies ?Device Orders - n/a ?Rep Notified - n/a ? ?Chest x-ray - 04/21/2021 ?EKG - 03/05/2022 ?Stress Test - 10/19/2014 ?ECHO - 04/11/2017 ?Cardiac Cath - 2003 ? ?CPAP - yes - sometimes ? ?Fasting Blood Sugar - 100 ?Checks Blood Sugar - once/week ? ?Blood Thinner Instructions: n/a ?Aspirin Instructions: Patient was instructed: As of today, STOP taking any Aspirin (unless otherwise instructed by your surgeon) Aleve, Naproxen, Ibuprofen, Motrin, Advil, Goody's, BC's, all herbal medications, fish oil, and all vitamins. ? ?ERAS Protcol - n/a ? ?COVID TEST- n/a - patient's daughter verbalized that patient wasn't in contact with anybody tested positive for COVID or with symptoms of COVID. ? ?Anesthesia review: yes - history of Mitral regurgitation, TIA; patient was hospitalized in April due to a mechanical fall ? ?Patient verbally denies any shortness of breath, fever, cough and chest pain during phone call ? ?-------------  SDW INSTRUCTIONS given: ? ?Your procedure is scheduled on Friday, May 12th, 2023. ? Report to [facility] Main Entrance "A" at 06:30 A.M., and check in at the Admitting office. ? Call this number if you have problems the morning of surgery: ? [phone] ? ? Remember: ? Do not eat or drink after midnight the night before your surgery ?  ? Take these medicines the morning of surgery with A SIP OF WATER Amlodipine, Tylenol, Zofran, Crestor, eye drops. ? ?WHAT DO I DO ABOUT MY DIABETES MEDICATION? ? ?Do not take Januvia the morning of surgery. ? ?How do I manage my blood sugar before surgery? ?Check your blood sugar at least 4 times a day, starting 2 days before surgery, to make sure that the level is not too high or low. ? ?Check your blood sugar the morning of your surgery when you wake up and every 2 hours until you get to the Short Stay unit. ? ?If your blood sugar is less than 70 mg/dL, you will need to treat for low blood  sugar: ?Do not take insulin. ?Treat a low blood sugar (less than 70 mg/dL) with ? cup of clear juice (cranberry or apple), 4 glucose tablets, OR glucose gel. ?Recheck blood sugar in 15 minutes after treatment (to make sure it is greater than 70 mg/dL). If your blood sugar is not greater than 70 mg/dL on recheck, call [phone] for further instructions. ?Report your blood sugar to the short stay nurse when you get to Short Stay. ? ?The day of surgery: ?         ?           Do not wear jewelry, make up, or nail polish ?           Do not wear lotions, powders, perfumes, or deodorant. ?           Do not shave 48 hours prior to surgery.   ?           Do not bring valuables to the hospital. ?           [facility] is not responsible for any belongings or valuables. ? ?Do NOT Smoke (Tobacco/Vaping) 24 hours prior to your procedure ?If you use a CPAP at night, you may bring all equipment for your overnight stay. ?  ?Contacts, glasses, dentures or bridgework may not be worn into surgery.    ? ?Patients discharged the day of surgery will not be allowed to drive home, and someone needs to stay with  them for 24 hours. ? ?Special instructions:   ?[facility]- Preparing For Surgery ? ?Before surgery, you can play an important role. Because skin is not sterile, your skin needs to be as free of germs as possible. You can reduce the number of germs on your skin by washing with CHG (chlorahexidine gluconate) Soap before surgery.  CHG is an antiseptic cleaner which kills germs and bonds with the skin to continue killing germs even after washing.   ? ?Oral Hygiene is also important to reduce your risk of infection.  Remember - BRUSH YOUR TEETH THE MORNING OF SURGERY WITH YOUR REGULAR TOOTHPASTE ? ?Please do not use if you have an allergy to CHG or antibacterial soaps. If your skin becomes reddened/irritated stop using the CHG.  ?Do not shave (including legs and underarms) for at least 48 hours prior to first CHG shower. It is OK to  shave your face. ? ?Please follow these instructions carefully. ?  ?Shower the NIGHT BEFORE SURGERY and the MORNING OF SURGERY with DIAL Soap.  ? ?Pat yourself dry with a CLEAN TOWEL. ? ?Wear CLEAN PAJAMAS to bed the night before surgery ? ?Place CLEAN SHEETS on your bed the night of your first shower and DO NOT SLEEP WITH PETS. ? ? ?Day of Surgery: ?Please shower morning of surgery  ?Wear Clean/Comfortable clothing the morning of surgery ?Do not apply any deodorants/lotions.   ?Remember to brush your teeth WITH YOUR REGULAR TOOTHPASTE. ?  ?Questions were answered. Patient verbalized understanding of instructions.  ? ? ?    ?

## 2022-03-29 NOTE — Anesthesia Preprocedure Evaluation (Addendum)
Anesthesia Evaluation  ?Patient identified by MRN, date of birth, ID band ?Patient awake ? ? ? ?Reviewed: ?Allergy & Precautions, NPO status , Patient's Chart, lab work & pertinent test results ? ?Airway ?Mallampati: II ? ?TM Distance: >3 FB ?Neck ROM: Full ? ? ? Dental ?no notable dental hx. ?(+) Missing, Dental Advisory Given, Poor Dentition ?  ?Pulmonary ?sleep apnea (not using because of eye) and Continuous Positive Airway Pressure Ventilation ,  ?  ?Pulmonary exam normal ?breath sounds clear to auscultation ? ? ? ? ? ? Cardiovascular ?hypertension, Pt. on medications ?Normal cardiovascular exam ?Rhythm:Regular Rate:Normal ? ? ?  ?Neuro/Psych ?negative neurological ROS ?   ? GI/Hepatic ?  ?Endo/Other  ?diabetes ? Renal/GU ?  ? ?  ?Musculoskeletal ? ?(+) Arthritis ,  ? Abdominal ?  ?Peds ? Hematology ?  ?Anesthesia Other Findings ?ALL ASA PCN NSAIDs ? Reproductive/Obstetrics ? ?  ? ? ? ? ? ? ? ? ? ? ? ? ? ?  ?  ? ? ? ? ? ? ?Anesthesia Physical ?Anesthesia Plan ? ?ASA: 3 ? ?Anesthesia Plan: General  ? ?Post-op Pain Management:   ? ?Induction: Intravenous ? ?PONV Risk Score and Plan: 3 and Treatment may vary due to age or medical condition, Ondansetron and Midazolam ? ?Airway Management Planned: Oral ETT ? ?Additional Equipment: None ? ?Intra-op Plan:  ? ?Post-operative Plan: Extubation in OR ? ?Informed Consent: I have reviewed the patients History and Physical, chart, labs and discussed the procedure including the risks, benefits and alternatives for the proposed anesthesia with the patient or authorized representative who has indicated his/her understanding and acceptance.  ? ? ? ?Dental advisory given ? ?Plan Discussed with: CRNA and Anesthesiologist ? ?Anesthesia Plan Comments: (PAT note written 03/29/2022 by Myra Gianotti, PA-C. ?)  ? ? ? ? ?Anesthesia Quick Evaluation ? ?

## 2022-03-29 NOTE — Progress Notes (Signed)
Anesthesia Chart Review: SAME DAY WORK-UP ? ? Case: [id number] Date/Time: 03/30/22 0845  ? Procedures:  ?    ORBITAL FLOOR FRACTURE REPAIR WITH IMPLANT (Right) ?    CANTHOPLASTY (Right)  ? Anesthesia type: General  ? Pre-op diagnosis: RIGHT CLOSED FRACTURE OF ORBIT WITH DELAYED HEALING  ? Location: [facility] OR ROOM 09 / [place] [facility]  ? Surgeons: Delia Chimes, MD  ? ?  ? ? ?DISCUSSION: Patient is a 76 year old female scheduled for the above procedure.  Hospitalization 03/03/22-03/05/22 for mechanical fall with facial trauma including acute displaced fracture involving the for the right orbit and possible area of bleeding adjacent to the posterior lateral aspect of the right globe.  Ophthalmology consulted and did not feel she required acute intervention and arrange outpatient follow-up.  Advised to not blow nose or use CPAP for 10 days.  PT OT evaluations done with suggestion for SNF or rehab but patient preferred to go home with home health therapies.  Daughter to help as well. ? ?Other history includes never smoker, HTN, DM2, TIA (~ 2010), mild mitral regurgitation (2018 echo), OSA (uses CPAP), glaucoma. ? ?Last cardiology visit with Dr. Radford Pax on 04/11/2021 for follow-up sleep apnea and hypertension.  PAP auto pressure decreased to 4-10cm H2O since she thinks the pressure is too high. ? ?She is a same-day work-up, so anesthesia team to evaluate on the day of surgery. ? ? ?VS:  ?BP Readings from Last 3 Encounters:  ?03/09/22 (!) 146/68  ?03/05/22 (!) 106/55  ?01/26/22 (!) 157/74  ? ?Pulse Readings from Last 3 Encounters:  ?03/09/22 90  ?03/05/22 81  ?01/26/22 70  ?  ? ?PROVIDERS: ?Vevelyn Francois, NP is PCP  ?Fransico Him, MD is cardiologist  ? ? ?LABS: For day of surgery as indicated.  Currently last results include: ?Lab Results  ?Component Value Date  ? WBC 10.4 03/05/2022  ? HGB 11.5 (L) 03/05/2022  ? HCT 35.1 (L) 03/05/2022  ? PLT 258 03/05/2022  ? GLUCOSE 128 (H) 03/05/2022  ? ALT 25 03/03/2022  ? AST 35 03/03/2022  ? NA  139 03/05/2022  ? K 3.8 03/05/2022  ? CL 100 03/05/2022  ? CREATININE 0.83 03/05/2022  ? BUN 8 03/05/2022  ? CO2 31 03/05/2022  ? HGBA1C 6.6 (A) 01/26/2022  ? ? ?PFTs > 6 years ago. ? ? ?IMAGES: ?CT C-spine 03/04/22:  ?IMPRESSION: ?1. No acute fracture or subluxation of the cervical spine. ?2. Moderate to marked severity multilevel degenerative changes, as ?described above. ?  ?CT Head 03/04/22: ?IMPRESSION: ?1. Acute, displaced fracture deformity involving the floor of the ?right orbit. ?2. 1.8 mm thick curvilinear hyperdense area adjacent to the ?posterolateral aspect of the right globe, which may represent a ?small amount of blood products. Correlation with MRI and an ?ophthalmology consult is recommended. ?3. Moderate severity right-sided facial, periorbital and preseptal ?soft tissue swelling. ?4. Marked severity hyperdense right maxillary sinus mucosal ?thickening, consistent with a large amount of blood products. ?  ?CT Maxillofacial 03/04/22: ?IMPRESSION: ?1. Acute fracture of the floor of the right orbit. ?2. Moderate severity right-sided facial, periorbital and preseptal ?soft tissue swelling. ?3. Marked severity right maxillary sinus mucosal thickening, with ?associated blood products. ?4. 1.8 mm thick curvilinear hyperdense area adjacent to the ?posterolateral aspect of the right globe which may represent a small ?amount of hemorrhage. Correlation with MRI and ophthalmology consult ?is recommended. ? ?CXR 04/21/21: ?IMPRESSION: ?Mild chronic bronchitic changes. No evidence of acute active ?cardiopulmonary disease. ?  ? ?EKG: 03/04/22: NSR ? ? ?  CV: ?Echo 04/11/17: ?Study Conclusions  ?- Left ventricle: The cavity size was normal. Wall thickness was  ?  increased in a pattern of moderate LVH. Systolic function was  ?  normal. The estimated ejection fraction was in the range of 60%  ?  to 65%. Wall motion was normal; there were no regional wall  ?  motion abnormalities. Doppler parameters are consistent with  ?   abnormal left ventricular relaxation (grade 1 diastolic  ?  dysfunction).  ?- Aortic valve: Mildly to moderately calcified annulus. There was no stenosis. There was no  ?regurgitation.  ?- Mitral valve: There was mild regurgitation.  ? ? ?Nuclear stress test 10/18/14: ?Overall Impression:  Normal stress nuclear study.   No evidence of ischemia.  Normal LV function.  ?  ?LV Ejection Fraction:68%.  No segmental wall motion abnormalities.  ? ? ?US Carotid 10/21/08: ?IMPRESSION:  ?1.  No evidence of hemodynamically significant stenosis involving  ?either the right or left carotid circulation in the neck.  ?2.  Minimal noncalcified plaque in the carotid bulbs bilaterally.  ?3.  Antegrade flow in both vertebral arteries.  ? ? ?Cardiac cath 07/30/02: ?SELECTIVE CORONARY ANGIOGRAPHY: ? 1. Left main:  Normal. ? 2. LAD:  Normal. ? 3. Left circumflex:  Normal. ? 4. Ramus intermedius branch is small and normal. ? 5. Right coronary artery is dominant and normal. ?  ? LEFT VENTRICULOGRAPHY:  RAO left ventriculogram was performed using 25 cc of ? Omnipaque dye at 12 cc per second.  The overall LVEF was estimated at ? greater than 60% without focal wall motion abnormalities. ?  ? IMPRESSION:  The patient has, again, normal coronary arteries ? angiographically and normal left ventricular function.  I believe her chest ? pain is noncardiac and her Cardiolite stress test was false positive ? secondary to breast attenuation.  She will be treated empirically with ? proton pump inhibitor for gastroesophageal reflux disease. ? ? ?Past Medical History:  ?Diagnosis Date  ? Arthritis   ? Diabetes mellitus without complication ([place])   ? Excessive daytime sleepiness   ? Glaucoma   ? High cholesterol   ? Hypertension   ? Mitral regurgitation   ? mild by echo 03/2017  ? OSA (obstructive sleep apnea) 10/18/2015  ? Mild OSA with AHI 12.5/hr on CPAP at 8cm H2O  ? Pneumonia   ? Shingles   ? TIA (transient ischemic attack)   ? ? ?Past Surgical History:   ?Procedure Laterality Date  ? ABDOMINAL SURGERY    ? EYE SURGERY Bilateral   ? cataract surgery  ? ROTATOR CUFF REPAIR    ? bilateral  ? TEE WITHOUT CARDIOVERSION N/A 07/20/2016  ? Procedure: TRANSESOPHAGEAL ECHOCARDIOGRAM (TEE);  Surgeon: Pixie Casino, MD;  Location: [place];  Service: Cardiovascular;  Laterality: N/A;  ? TOOTH EXTRACTION Left 10/09/2019  ? Procedure: DENTAL EXTRACTION X1 and Irrigation and Debridement.;  Surgeon: Diona Browner, DDS;  Location: [facility];  Service: Oral Surgery;  Laterality: Left;  DENTAL EXTRACTION X1 and Irrigation and Debridement.  ? ? ?MEDICATIONS: ?No current facility-administered medications for this encounter.  ? ? acetaminophen (TYLENOL) 500 MG tablet  ? amLODipine (NORVASC) 5 MG tablet  ? brimonidine-timolol (COMBIGAN) 0.2-0.5 % ophthalmic solution  ? cycloSPORINE (RESTASIS) 0.05 % ophthalmic emulsion  ? ferrous sulfate 325 (65 FE) MG tablet  ? gabapentin (NEURONTIN) 300 MG capsule  ? latanoprost (XALATAN) 0.005 % ophthalmic solution  ? Melatonin 10 MG TABS  ? Multiple Vitamins-Minerals (MULTIVITAMIN WITH  MINERALS) tablet  ? Omega-3 Fatty Acids (FISH OIL) 1000 MG CAPS  ? ondansetron (ZOFRAN-ODT) 4 MG disintegrating tablet  ? rosuvastatin (CRESTOR) 20 MG tablet  ? sitaGLIPtin (JANUVIA) 50 MG tablet  ? ACCU-CHEK AVIVA PLUS test strip  ? Accu-Chek Softclix Lancets lancets  ? Blood Glucose Monitoring Suppl (ACCU-CHEK AVIVA PLUS) w/Device KIT  ? Blood Pressure Monitor KIT  ? ? ?Myra Gianotti, PA-C ?Surgical Short Stay/Anesthesiology ?[facility] Phone [phone] ?[facility] Phone [phone] ?03/29/2022 4:18 PM ? ? ? ? ? ? ? ?

## 2022-03-30 ENCOUNTER — Ambulatory Visit (HOSPITAL_COMMUNITY)
Admission: RE | Admit: 2022-03-30 | Discharge: 2022-03-30 | Disposition: A | Discharge status: Home / Self Care | Payer: Medicare Other | Attending: Optometry | Admitting: Optometry

## 2022-03-30 ENCOUNTER — Encounter (HOSPITAL_COMMUNITY)
Admission: RE | Disposition: A | Discharge status: Home / Self Care | Payer: Self-pay | Source: Home / Self Care | Attending: Optometry

## 2022-03-30 ENCOUNTER — Encounter (HOSPITAL_COMMUNITY): Payer: Self-pay | Admitting: Optometry

## 2022-03-30 ENCOUNTER — Ambulatory Visit (HOSPITAL_BASED_OUTPATIENT_CLINIC_OR_DEPARTMENT_OTHER): Payer: Medicare Other | Admitting: Vascular Surgery

## 2022-03-30 ENCOUNTER — Ambulatory Visit (HOSPITAL_COMMUNITY): Payer: Medicare Other | Admitting: Vascular Surgery

## 2022-03-30 DIAGNOSIS — H42 Glaucoma in diseases classified elsewhere: Secondary | ICD-10-CM | POA: Diagnosis not present

## 2022-03-30 DIAGNOSIS — S0231XA Fracture of orbital floor, right side, initial encounter for closed fracture: Secondary | ICD-10-CM | POA: Diagnosis not present

## 2022-03-30 DIAGNOSIS — I340 Nonrheumatic mitral (valve) insufficiency: Secondary | ICD-10-CM | POA: Diagnosis not present

## 2022-03-30 DIAGNOSIS — Z8673 Personal history of transient ischemic attack (TIA), and cerebral infarction without residual deficits: Secondary | ICD-10-CM | POA: Diagnosis not present

## 2022-03-30 DIAGNOSIS — I10 Essential (primary) hypertension: Secondary | ICD-10-CM | POA: Diagnosis not present

## 2022-03-30 DIAGNOSIS — E1139 Type 2 diabetes mellitus with other diabetic ophthalmic complication: Secondary | ICD-10-CM | POA: Diagnosis not present

## 2022-03-30 DIAGNOSIS — W19XXXD Unspecified fall, subsequent encounter: Secondary | ICD-10-CM | POA: Diagnosis not present

## 2022-03-30 DIAGNOSIS — G4733 Obstructive sleep apnea (adult) (pediatric): Secondary | ICD-10-CM | POA: Diagnosis not present

## 2022-03-30 DIAGNOSIS — S0231XG Fracture of orbital floor, right side, subsequent encounter for fracture with delayed healing: Secondary | ICD-10-CM | POA: Diagnosis not present

## 2022-03-30 DIAGNOSIS — E119 Type 2 diabetes mellitus without complications: Secondary | ICD-10-CM | POA: Diagnosis not present

## 2022-03-30 DIAGNOSIS — S0285XG Fracture of orbit, unspecified, subsequent encounter for fracture with delayed healing: Secondary | ICD-10-CM | POA: Diagnosis not present

## 2022-03-30 DIAGNOSIS — Z9989 Dependence on other enabling machines and devices: Secondary | ICD-10-CM | POA: Diagnosis not present

## 2022-03-30 HISTORY — PX: ORIF ORBITAL FRACTURE: SHX5312

## 2022-03-30 HISTORY — PX: CANTHOPLASTY: SHX1286

## 2022-03-30 HISTORY — PX: EYE EXAMINATION UNDER ANESTHESIA: SHX1560

## 2022-03-30 LAB — GLUCOSE, CAPILLARY
Glucose-Capillary: 115 mg/dL — ABNORMAL HIGH (ref 70–99)
Glucose-Capillary: 100 mg/dL — ABNORMAL HIGH (ref 70–99)
Glucose-Capillary: 149 mg/dL — ABNORMAL HIGH (ref 70–99)

## 2022-03-30 SURGERY — OPEN REDUCTION INTERNAL FIXATION (ORIF) ORBITAL FRACTURE
Anesthesia: General | Site: Eye | Laterality: Right

## 2022-03-30 MED ORDER — FENTANYL CITRATE (PF) 250 MCG/5ML IJ SOLN
INTRAMUSCULAR | Status: AC
  Filled 2022-03-30: qty 5

## 2022-03-30 MED ORDER — SODIUM CHLORIDE 0.9 % IV SOLN: 250.0000 mL | INTRAVENOUS | Status: DC | PRN

## 2022-03-30 MED ORDER — CHLORHEXIDINE GLUCONATE 0.12 % MT SOLN: 15.0000 mL | Freq: Once | OROMUCOSAL | Status: AC

## 2022-03-30 MED ORDER — LIDOCAINE-EPINEPHRINE 1 %-1:100000 IJ SOLN
INTRAMUSCULAR | Status: AC
  Filled 2022-03-30: qty 1

## 2022-03-30 MED ORDER — ROCURONIUM BROMIDE 10 MG/ML (PF) SYRINGE
PREFILLED_SYRINGE | INTRAVENOUS | Status: AC
  Filled 2022-03-30: qty 10

## 2022-03-30 MED ORDER — BUPIVACAINE HCL (PF) 0.75 % IJ SOLN
INTRAMUSCULAR | Status: AC
  Filled 2022-03-30: qty 10

## 2022-03-30 MED ORDER — ACETAMINOPHEN 500 MG PO TABS: 1000.0000 mg | ORAL_TABLET | Freq: Four times a day (QID) | ORAL | Status: DC

## 2022-03-30 MED ORDER — OXYCODONE HCL 5 MG PO TABS: 5.0000 mg | ORAL_TABLET | ORAL | Status: DC | PRN

## 2022-03-30 MED ORDER — CHLORHEXIDINE GLUCONATE 0.12 % MT SOLN
OROMUCOSAL | Status: AC
  Administered 2022-03-30: 15 mL via OROMUCOSAL
  Filled 2022-03-30: qty 15

## 2022-03-30 MED ORDER — OXYCODONE-ACETAMINOPHEN 5-325 MG PO TABS
ORAL_TABLET | ORAL | Status: AC
  Filled 2022-03-30: qty 1

## 2022-03-30 MED ORDER — FENTANYL CITRATE (PF) 100 MCG/2ML IJ SOLN: 25.0000 ug | INTRAMUSCULAR | Status: DC | PRN

## 2022-03-30 MED ORDER — LIDOCAINE 2% (20 MG/ML) 5 ML SYRINGE
INTRAMUSCULAR | Status: AC
  Filled 2022-03-30: qty 5

## 2022-03-30 MED ORDER — DEXAMETHASONE SODIUM PHOSPHATE 10 MG/ML IJ SOLN
INTRAMUSCULAR | Status: AC
  Filled 2022-03-30: qty 1

## 2022-03-30 MED ORDER — BSS IO SOLN
INTRAOCULAR | Status: DC | PRN
  Administered 2022-03-30: 15 mL via INTRAOCULAR

## 2022-03-30 MED ORDER — OXYMETAZOLINE HCL 0.05 % NA SOLN
NASAL | Status: AC
  Filled 2022-03-30: qty 30

## 2022-03-30 MED ORDER — ACETAMINOPHEN 10 MG/ML IV SOLN: 1000.0000 mg | Freq: Once | INTRAVENOUS | Status: DC | PRN

## 2022-03-30 MED ORDER — SODIUM CHLORIDE 0.9 % IV SOLN: INTRAVENOUS | Status: DC

## 2022-03-30 MED ORDER — STERILE WATER FOR IRRIGATION IR SOLN
Status: DC | PRN
  Administered 2022-03-30: 1000 mL

## 2022-03-30 MED ORDER — PROPOFOL 10 MG/ML IV BOLUS
INTRAVENOUS | Status: DC | PRN
  Administered 2022-03-30: 40 mg via INTRAVENOUS
  Administered 2022-03-30: 110 mg via INTRAVENOUS

## 2022-03-30 MED ORDER — LIDOCAINE 2% (20 MG/ML) 5 ML SYRINGE
INTRAMUSCULAR | Status: DC | PRN
  Administered 2022-03-30: 100 mg via INTRAVENOUS

## 2022-03-30 MED ORDER — ONDANSETRON HCL 4 MG/2ML IJ SOLN
INTRAMUSCULAR | Status: AC
  Filled 2022-03-30: qty 2

## 2022-03-30 MED ORDER — MIDAZOLAM HCL 2 MG/2ML IJ SOLN
INTRAMUSCULAR | Status: AC
  Filled 2022-03-30: qty 2

## 2022-03-30 MED ORDER — TETRACAINE HCL 0.5 % OP SOLN
OPHTHALMIC | Status: DC | PRN
  Administered 2022-03-30: 1 [drp] via OPHTHALMIC

## 2022-03-30 MED ORDER — CLINDAMYCIN PHOSPHATE 900 MG/50ML IV SOLN
900.0000 mg | Freq: Once | INTRAVENOUS | Status: AC
  Administered 2022-03-30: 900 mg via INTRAVENOUS

## 2022-03-30 MED ORDER — ONDANSETRON HCL 4 MG/2ML IJ SOLN
INTRAMUSCULAR | Status: DC | PRN
  Administered 2022-03-30: 4 mg via INTRAVENOUS

## 2022-03-30 MED ORDER — OXYCODONE-ACETAMINOPHEN 5-325 MG PO TABS
1.0000 | ORAL_TABLET | Freq: Once | ORAL | Status: AC
  Administered 2022-03-30: 1 via ORAL

## 2022-03-30 MED ORDER — TETRACAINE HCL 0.5 % OP SOLN
OPHTHALMIC | Status: AC
  Filled 2022-03-30: qty 4

## 2022-03-30 MED ORDER — EPINEPHRINE HCL (NASAL) 0.1 % NA SOLN
NASAL | Status: AC
  Filled 2022-03-30: qty 30

## 2022-03-30 MED ORDER — ERYTHROMYCIN 5 MG/GM OP OINT
1.0000 "application " | TOPICAL_OINTMENT | Freq: Once | OPHTHALMIC | Status: DC
  Filled 2022-03-30: qty 3.5

## 2022-03-30 MED ORDER — SODIUM CHLORIDE 0.9% FLUSH: 3.0000 mL | INTRAVENOUS | Status: DC | PRN

## 2022-03-30 MED ORDER — SUGAMMADEX SODIUM 200 MG/2ML IV SOLN
INTRAVENOUS | Status: DC | PRN
  Administered 2022-03-30: 140 mg via INTRAVENOUS

## 2022-03-30 MED ORDER — DEXAMETHASONE SODIUM PHOSPHATE 10 MG/ML IJ SOLN
INTRAMUSCULAR | Status: DC | PRN
  Administered 2022-03-30: 4 mg via INTRAVENOUS

## 2022-03-30 MED ORDER — ROCURONIUM BROMIDE 10 MG/ML (PF) SYRINGE
PREFILLED_SYRINGE | INTRAVENOUS | Status: DC | PRN
  Administered 2022-03-30: 20 mg via INTRAVENOUS
  Administered 2022-03-30 (×2): 10 mg via INTRAVENOUS
  Administered 2022-03-30: 50 mg via INTRAVENOUS

## 2022-03-30 MED ORDER — LIDOCAINE-EPINEPHRINE 1 %-1:100000 IJ SOLN
INTRAMUSCULAR | Status: DC | PRN
  Administered 2022-03-30: 2 mL

## 2022-03-30 MED ORDER — OXYMETAZOLINE HCL 0.05 % NA SOLN
1.0000 | NASAL | Status: AC
  Administered 2022-03-30 (×3): 1 via NASAL
  Filled 2022-03-30: qty 30

## 2022-03-30 MED ORDER — PROPOFOL 10 MG/ML IV BOLUS
INTRAVENOUS | Status: AC
  Filled 2022-03-30: qty 20

## 2022-03-30 MED ORDER — FENTANYL CITRATE (PF) 100 MCG/2ML IJ SOLN
INTRAMUSCULAR | Status: DC | PRN
  Administered 2022-03-30: 50 ug via INTRAVENOUS
  Administered 2022-03-30 (×2): 25 ug via INTRAVENOUS
  Administered 2022-03-30 (×3): 50 ug via INTRAVENOUS

## 2022-03-30 MED ORDER — ACETAMINOPHEN 325 MG PO TABS: 650.0000 mg | ORAL_TABLET | ORAL | Status: DC | PRN

## 2022-03-30 MED ORDER — POVIDONE-IODINE 5 % EX SOLN
CUTANEOUS | Status: DC | PRN
  Administered 2022-03-30: 1 via TOPICAL

## 2022-03-30 MED ORDER — OXYCODONE-ACETAMINOPHEN 5-325 MG PO TABS: 1.0000 | ORAL_TABLET | ORAL | 0 refills | Status: DC | PRN

## 2022-03-30 MED ORDER — ACETAMINOPHEN 650 MG RE SUPP: 650.0000 mg | RECTAL | Status: DC | PRN

## 2022-03-30 MED ORDER — ONDANSETRON HCL 4 MG/2ML IJ SOLN: 4.0000 mg | Freq: Once | INTRAMUSCULAR | Status: DC | PRN

## 2022-03-30 MED ORDER — INSULIN ASPART 100 UNIT/ML IJ SOLN: 0.0000 [IU] | INTRAMUSCULAR | Status: DC | PRN

## 2022-03-30 MED ORDER — EPINEPHRINE HCL (NASAL) 0.1 % NA SOLN
NASAL | Status: DC | PRN
  Administered 2022-03-30: 30 mL via TOPICAL

## 2022-03-30 MED ORDER — ORAL CARE MOUTH RINSE: 15.0000 mL | Freq: Once | OROMUCOSAL | Status: AC

## 2022-03-30 SURGICAL SUPPLY — 62 items
APL SRG 3 HI ABS STRL LF PLS (MISCELLANEOUS) ×2
APL SWBSTK 6 STRL LF DISP (MISCELLANEOUS) ×1
APPLICATOR COTTON TIP 6 STRL (MISCELLANEOUS) ×2 IMPLANT
APPLICATOR COTTON TIP 6IN STRL (MISCELLANEOUS) ×2
APPLICATOR DR MATTHEWS STRL (MISCELLANEOUS) ×6 IMPLANT
BAG COUNTER SPONGE SURGICOUNT (BAG) ×3 IMPLANT
BAG SPNG CNTER NS LX DISP (BAG) ×1
BAND DENTAL 1/4IN PULL MED (MISCELLANEOUS) ×3 IMPLANT
BLADE SURG 15 STRL LF DISP TIS (BLADE) ×4 IMPLANT
BLADE SURG 15 STRL SS (BLADE) ×2
BNDG CMPR 5X2 CHSV 1 LYR STRL (GAUZE/BANDAGES/DRESSINGS) ×1
BNDG COHESIVE 2X5 TAN ST LF (GAUZE/BANDAGES/DRESSINGS) ×3 IMPLANT
CORD BIPOLAR FORCEPS 12FT (ELECTRODE) ×3 IMPLANT
COVER SURGICAL LIGHT HANDLE (MISCELLANEOUS) ×3 IMPLANT
DRAPE ORTHO SPLIT 77X108 STRL (DRAPES) ×2
DRAPE SURG 17X23 STRL (DRAPES) ×6 IMPLANT
DRAPE SURG ORHT 6 SPLT 77X108 (DRAPES) ×2 IMPLANT
ELECT COLORADO STRAIGHT 3 (ELECTROSURGICAL) ×2
ELECT NDL BLADE 2-5/6 (NEEDLE) ×2 IMPLANT
ELECT NEEDLE BLADE 2-5/6 (NEEDLE) ×2 IMPLANT
ELECT REM PT RETURN 9FT ADLT (ELECTROSURGICAL) ×2
ELECTRODE COLORADO STRAIGHT 3 (ELECTROSURGICAL) IMPLANT
ELECTRODE REM PT RTRN 9FT ADLT (ELECTROSURGICAL) ×2 IMPLANT
FORCEPS BIPOLAR SPETZLER 8 1.0 (NEUROSURGERY SUPPLIES) ×3 IMPLANT
GAUZE XEROFORM 1X8 LF (GAUZE/BANDAGES/DRESSINGS) IMPLANT
GLOVE BIO SURGEON STRL SZ7.5 (GLOVE) ×6 IMPLANT
GLOVE SURG SYN 7.5  E (GLOVE) ×2
GLOVE SURG SYN 7.5 E (GLOVE) ×1 IMPLANT
GLOVE SURG SYN 7.5 PF PI (GLOVE) ×2 IMPLANT
GOWN STRL REUS W/ TWL LRG LVL3 (GOWN DISPOSABLE) ×4 IMPLANT
GOWN STRL REUS W/TWL LRG LVL3 (GOWN DISPOSABLE) ×4
IMPL MEDPOR OFW MTM 41X42 (Mesh General) IMPLANT
IMPLANT MEDPOR OFW MTM 41X42 (Mesh General) ×2 IMPLANT
KIT BASIN OR (CUSTOM PROCEDURE TRAY) ×3 IMPLANT
NDL PRECISIONGLIDE 27X1.5 (NEEDLE) IMPLANT
NEEDLE PRECISIONGLIDE 27X1.5 (NEEDLE) IMPLANT
NS IRRIG 1000ML POUR BTL (IV SOLUTION) ×3 IMPLANT
PACK CATARACT CUSTOM (CUSTOM PROCEDURE TRAY) ×3 IMPLANT
PAD ARMBOARD 7.5X6 YLW CONV (MISCELLANEOUS) ×6 IMPLANT
PATTIES SURGICAL .5 X3 (DISPOSABLE) ×1 IMPLANT
PENCIL BUTTON HOLSTER BLD 10FT (ELECTRODE) ×3 IMPLANT
PROTECTOR CORNEAL (OPHTHALMIC RELATED) ×1 IMPLANT
ROLLS DENTAL (MISCELLANEOUS) IMPLANT
SCREW UPPERFACE 1.2X4M SLFDRIL (Screw) ×2 IMPLANT
STAPLER VISISTAT (STAPLE) ×3 IMPLANT
STRIP CLOSURE SKIN 1/2X4 (GAUZE/BANDAGES/DRESSINGS) ×3 IMPLANT
SUCTION FRAZIER TIP 8 FR DISP (SUCTIONS) ×2
SUCTION TUBE FRAZIER 8FR DISP (SUCTIONS) IMPLANT
SURGILUBE 2OZ TUBE FLIPTOP (MISCELLANEOUS) ×2 IMPLANT
SUT CHROMIC 5 0 P 3 (SUTURE) ×2 IMPLANT
SUT ETHILON 7 0 P 6 18 (SUTURE) ×2 IMPLANT
SUT MON AB 5-0 PS2 18 (SUTURE) IMPLANT
SUT PLAIN 5 0 P 3 18 (SUTURE) ×2 IMPLANT
SUT PLAIN 6 0 TG1408 (SUTURE) ×1 IMPLANT
SUT PROLENE 4 0 PS 2 18 (SUTURE) IMPLANT
SUT SILK 4 0 C 3 735G (SUTURE) ×3 IMPLANT
SUT SILK 4 0 P 3 (SUTURE) ×2 IMPLANT
SUT VIC AB 4-0 P-3 18XBRD (SUTURE) ×2 IMPLANT
SUT VIC AB 4-0 P3 18 (SUTURE) ×2
TOWEL GREEN STERILE FF (TOWEL DISPOSABLE) ×6 IMPLANT
TUBE CONNECTING 12X1/4 (SUCTIONS) ×1 IMPLANT
WATER STERILE IRR 1000ML POUR (IV SOLUTION) ×3 IMPLANT

## 2022-03-30 NOTE — Op Note (Signed)
Op Note for Floor Fx ?HOSPITAL NAME:  [id number] ?DATE OF SERVICE:  03/30/2022  ?DATE OF BIRTH:  Jan 29, 1946 ? ? ?Pre-Operative Diagnosis: Orbital floor fracture, right side ?Post-Operative Diagnosis: Same ? ?Procedure(s)/Description: ? ?1. Repair of right orbital floor fracture with insertion of Titan orbital implant ?2. Right lateral canthoplasty ? ?The patient was brought to the operating room and correctly identified. After general anesthesia was administered, 2% lidocaine with epinephrine 1:100,000 was injected at the lateral canthus and inferior fornix. The patient was then prepped and draped in the usual sterile fashion. A 4-0 silk suture was placed for traction in the lower lid gray line. A #15 blade was used to make a lateral canthotomy, followed by Westcott scissors for a lower lid cantholysis. The needle cautery was used to initiate an incision followed by Westcott scissors below the tarsus on the conjunctival surface medially to the level of the punctum. Dissection was then carried out anterior to the septum down to the orbital rim. An additional 4-0 silk suture was placed in the lower conjunctiva and retractors for traction. The periosteum was incised and and a freer/elevator was used to lift the periosteum off the bone. Exploration was carried out posteriorly. The entrapped orbital contents were identified and lifted from the fracture defect in the orbital floor. The medial, lateral, and posterior extents of the fracture were identified. Epinephrine-soaked neuro paddies were used for retraction. The defect was measured at approximately 25 x 25 mm. A Titan MedPor implant was then fashioned into similar size placed over the fracture. The neuro paddies were then removed. Forced ductions were rechecked which revealed more freed movement of the globe. Once satisfactorily positioned, the periosteum was closed with 4-0 Vicryl and the conjunctiva was closed with 6-0 plain gut. The silk traction sutures were all  removed. The lateral canthus angle was reformed with the 4-0 vicryl. The 4-0 Vicryl was used to close orbicularis, and 6-0 plain gut to close the skin. One lase time, forced ductions were repeated and noted to be much improved compared to preoperative assessment. Erythromycin ophthalmic ointment was placed and the patient transported to the recovery area in stable condition. ? ?Attending Surgeon: Delia Chimes, MD, PhD ?   ? ?Anesthesia Type: General ?Estimated Blood Loss:  <5cc ?Blood Given: None ?Fluids Given: Per anesthesia ?Complications: None ?Characteristic Event: None ?Did the use of long term anticoagulant impact the outcome of the case? No ?Wound Class: Clean ?Tubes: None ?Drains: None ?Specimens/ Cultures: None ?Implants: Titan orbital floor implant, right side ?         ?Disposition: PACU, then home ?Condition: Stable ? ?Delia Chimes  03/30/2022, 1:36 PM  ?

## 2022-03-30 NOTE — Anesthesia Procedure Notes (Signed)
Procedure Name: Intubation ?Date/Time: 03/30/2022 9:24 AM ?Performed by: Barrington Ellison, CRNA ?Pre-anesthesia Checklist: Patient identified, Emergency Drugs available, Suction available and Patient being monitored ?Patient Re-evaluated:Patient Re-evaluated prior to induction ?Oxygen Delivery Method: Circle System Utilized ?Preoxygenation: Pre-oxygenation with 100% oxygen ?Induction Type: IV induction ?Ventilation: Mask ventilation without difficulty and Oral airway inserted - appropriate to patient size ?Laryngoscope Size: Glidescope and 3 ?Grade View: Grade I ?Tube type: Oral ?Tube size: 7.0 mm ?Number of attempts: 3 ?Airway Equipment and Method: Stylet and Oral airway ?Placement Confirmation: ETT inserted through vocal cords under direct vision, positive ETCO2 and breath sounds checked- equal and bilateral ?Secured at: 21 cm ?Tube secured with: Tape ?Dental Injury: Teeth and Oropharynx as per pre-operative assessment  ?Difficulty Due To: Difficulty was unanticipated, Difficult Airway- due to reduced neck mobility and Difficult Airway- due to anterior larynx ?Future Recommendations: Recommend- induction with short-acting agent, and alternative techniques readily available ?Comments: DL x 2, Grade 2-3 view, entered esophagus x 1, no ETCO2 with 2nd attempt, DL by MDA, no view, Glidescope x 1 grade 1 view. ? ? ? ? ?

## 2022-03-30 NOTE — Interval H&P Note (Signed)
History and Physical Interval Note: ? ?03/30/2022 ?7:11 AM ? ?Beth Hubbard  has presented today for surgery, with the diagnosis of RIGHT CLOSED FRACTURE OF ORBIT WITH DELAYED HEALING.  The various methods of treatment have been discussed with the patient and family. After consideration of risks, benefits and other options for treatment, the patient has consented to  Procedure(s): ?ORBITAL FLOOR FRACTURE REPAIR WITH IMPLANT (Right) ?CANTHOPLASTY (Right) as a surgical intervention.  The patient's history has been reviewed, patient examined, no change in status, stable for surgery.  I have reviewed the patient's chart and labs.  Questions were answered to the patient's satisfaction.   ? ? ?Delia Chimes ? ? ?

## 2022-03-30 NOTE — Anesthesia Postprocedure Evaluation (Signed)
Anesthesia Post Note ? ?Patient: Beth Hubbard ? ?Procedure(s) Performed: ORBITAL FLOOR FRACTURE REPAIR WITH IMPLANT (Right: Eye) ?CANTHOPLASTY (Right: Eye) ?EYE EXAM UNDER ANESTHESIA (Right: Eye) ? ?  ? ?Patient location during evaluation: PACU ?Anesthesia Type: General ?Level of consciousness: awake and alert ?Pain management: pain level controlled ?Vital Signs Assessment: post-procedure vital signs reviewed and stable ?Respiratory status: spontaneous breathing, nonlabored ventilation, respiratory function stable and patient connected to nasal cannula oxygen ?Cardiovascular status: blood pressure returned to baseline and stable ?Postop Assessment: no apparent nausea or vomiting ?Anesthetic complications: no ? ? ?No notable events documented. ? ?Last Vitals:  ?Vitals:  ? 03/30/22 1509 03/30/22 1524  ?BP: (!) 161/58 (!) 145/59  ?Pulse: 97 95  ?Resp: 20 14  ?Temp:  37.2 ?C  ?SpO2: 96% 99%  ?  ?Last Pain:  ?Vitals:  ? 03/30/22 1540  ?TempSrc:   ?PainSc: 4   ? ? ?  ?  ?  ?  ?  ?  ? ?[place]et [place] ? ? ? ? ?

## 2022-03-30 NOTE — Transfer of Care (Signed)
Immediate Anesthesia Transfer of Care Note ? ?Patient: Beth Hubbard ? ?Procedure(s) Performed: ORBITAL FLOOR FRACTURE REPAIR WITH IMPLANT (Right: Eye) ?CANTHOPLASTY (Right: Eye) ?EYE EXAM UNDER ANESTHESIA (Right: Eye) ? ?Patient Location: PACU ? ?Anesthesia Type:General ? ?Level of Consciousness: drowsy and patient cooperative ? ?Airway & Oxygen Therapy: Patient Spontanous Breathing ? ?Post-op Assessment: Report given to RN ? ?Post vital signs: Reviewed and stable ? ?Last Vitals:  ?Vitals Value Taken Time  ?BP 151/69 03/30/22 1348  ?Temp    ?Pulse 99 03/30/22 1349  ?Resp 16 03/30/22 1349  ?SpO2 93 % 03/30/22 1349  ?Vitals shown include unvalidated device data. ? ?Last Pain:  ?Vitals:  ? 03/30/22 0659  ?TempSrc:   ?PainSc: 0-No pain  ?   ? ?  ? ?Complications: No notable events documented. ?

## 2022-03-31 ENCOUNTER — Encounter (HOSPITAL_COMMUNITY): Payer: Self-pay | Admitting: Optometry

## 2022-04-02 DIAGNOSIS — E7800 Pure hypercholesterolemia, unspecified: Secondary | ICD-10-CM | POA: Diagnosis not present

## 2022-04-02 DIAGNOSIS — Z8673 Personal history of transient ischemic attack (TIA), and cerebral infarction without residual deficits: Secondary | ICD-10-CM | POA: Diagnosis not present

## 2022-04-02 DIAGNOSIS — G319 Degenerative disease of nervous system, unspecified: Secondary | ICD-10-CM | POA: Diagnosis not present

## 2022-04-02 DIAGNOSIS — E119 Type 2 diabetes mellitus without complications: Secondary | ICD-10-CM | POA: Diagnosis not present

## 2022-04-02 DIAGNOSIS — I051 Rheumatic mitral insufficiency: Secondary | ICD-10-CM | POA: Diagnosis not present

## 2022-04-02 DIAGNOSIS — G8929 Other chronic pain: Secondary | ICD-10-CM | POA: Diagnosis not present

## 2022-04-02 DIAGNOSIS — Z9181 History of falling: Secondary | ICD-10-CM | POA: Diagnosis not present

## 2022-04-02 DIAGNOSIS — M4802 Spinal stenosis, cervical region: Secondary | ICD-10-CM | POA: Diagnosis not present

## 2022-04-02 DIAGNOSIS — M1990 Unspecified osteoarthritis, unspecified site: Secondary | ICD-10-CM | POA: Diagnosis not present

## 2022-04-02 DIAGNOSIS — Z7952 Long term (current) use of systemic steroids: Secondary | ICD-10-CM | POA: Diagnosis not present

## 2022-04-02 DIAGNOSIS — I10 Essential (primary) hypertension: Secondary | ICD-10-CM | POA: Diagnosis not present

## 2022-04-02 DIAGNOSIS — S0231XD Fracture of orbital floor, right side, subsequent encounter for fracture with routine healing: Secondary | ICD-10-CM | POA: Diagnosis not present

## 2022-04-02 DIAGNOSIS — Z7984 Long term (current) use of oral hypoglycemic drugs: Secondary | ICD-10-CM | POA: Diagnosis not present

## 2022-04-02 DIAGNOSIS — H409 Unspecified glaucoma: Secondary | ICD-10-CM | POA: Diagnosis not present

## 2022-04-02 DIAGNOSIS — G4700 Insomnia, unspecified: Secondary | ICD-10-CM | POA: Diagnosis not present

## 2022-04-02 DIAGNOSIS — G4733 Obstructive sleep apnea (adult) (pediatric): Secondary | ICD-10-CM | POA: Diagnosis not present

## 2022-04-02 DIAGNOSIS — M47812 Spondylosis without myelopathy or radiculopathy, cervical region: Secondary | ICD-10-CM | POA: Diagnosis not present

## 2022-04-03 ENCOUNTER — Other Ambulatory Visit: Payer: Self-pay | Admitting: Nurse Practitioner

## 2022-04-03 DIAGNOSIS — I10 Essential (primary) hypertension: Secondary | ICD-10-CM

## 2022-04-07 ENCOUNTER — Other Ambulatory Visit: Payer: Self-pay | Admitting: Nurse Practitioner

## 2022-04-07 DIAGNOSIS — E785 Hyperlipidemia, unspecified: Secondary | ICD-10-CM

## 2022-04-09 ENCOUNTER — Ambulatory Visit: Payer: Medicare Other | Admitting: Nurse Practitioner

## 2022-04-13 ENCOUNTER — Telehealth: Payer: Self-pay | Admitting: Nurse Practitioner

## 2022-04-13 ENCOUNTER — Other Ambulatory Visit: Payer: Self-pay | Admitting: Nurse Practitioner

## 2022-04-13 DIAGNOSIS — M542 Cervicalgia: Secondary | ICD-10-CM

## 2022-04-13 MED ORDER — GABAPENTIN 300 MG PO CAPS: ORAL_CAPSULE | ORAL | 3 refills | Status: DC

## 2022-04-13 NOTE — Telephone Encounter (Signed)
Patient refills were sent and patient will need to follow up with pain management for future refills.

## 2022-04-13 NOTE — Telephone Encounter (Signed)
Pt requests T[facility] to call regarding sleep meds not working.

## 2022-04-13 NOTE — Telephone Encounter (Signed)
Gabapentin refill request 

## 2022-04-13 NOTE — Telephone Encounter (Signed)
Per tewana she will need to follow up with pain management for her sleep medication.

## 2022-04-17 ENCOUNTER — Ambulatory Visit (INDEPENDENT_AMBULATORY_CARE_PROVIDER_SITE_OTHER): Payer: Medicare Other | Admitting: Nurse Practitioner

## 2022-04-17 VITALS — BP 148/60 | HR 82 | Temp 97.9°F | Ht 64.0 in | Wt 151.2 lb

## 2022-04-17 DIAGNOSIS — R531 Weakness: Secondary | ICD-10-CM | POA: Diagnosis not present

## 2022-04-17 DIAGNOSIS — I10 Essential (primary) hypertension: Secondary | ICD-10-CM

## 2022-04-17 DIAGNOSIS — W19XXXA Unspecified fall, initial encounter: Secondary | ICD-10-CM | POA: Diagnosis not present

## 2022-04-17 MED ORDER — LISINOPRIL 20 MG PO TABS: 20.0000 mg | ORAL_TABLET | Freq: Every day | ORAL | 3 refills | Status: DC

## 2022-04-17 NOTE — Progress Notes (Unsigned)
Beth Hubbard, Beth Hubbard  [id number] Phone:  [phone]   Fax:  [phone] Subjective:   Patient ID: Beth Hubbard, female    DOB: 07-07-1946, 76 y.o.   MRN: [id number]  Chief Complaint  Patient presents with   Insomnia    Pt stated she is unable to sleep the medication is not working. Daughter stated patient life style is the cause of her insomnia. Pt is requesting a wheelchair and handicap sticker   HPI Beth Hubbard 76 y.o. female  has a past medical history of Arthritis, Diabetes mellitus without complication ([place]), Excessive daytime sleepiness, Glaucoma, High cholesterol, Hypertension, Mitral regurgitation, OSA (obstructive sleep apnea) (10/18/2015), Pneumonia, Shingles, and TIA (transient ischemic attack). To the [facility] for reevaluation of HTN and insomnia.   Patient brought in by daughter who states that patient is no longer going to pain management and all pain medications were discontinued.   Daughter indicates that family is concerned about potential abuse/ misuse of medications. Family agrees that patient should not receive prescribed sleep medications due to increased risk for falls. Patient currently resides alone and no longer lives with daughter.   Patient is concerned about increased insomnia, states that she does not sleep at home. Daughter indicates that mother may have increased daytime sleeping.  Family requesting prescription for wheelchair, states that when patient is ambulating she experiences increased fatigue. She also continues to have blurred vision related to recent fall.  Family also endorses that the patient has stopped taking iron supplements. Denies any falls since previous visit. Completed appointment with eye doctor and has had surgery. Procedure went well, but unceratin if blurred vision will ever resolve. Denies any other concerns today.   Denies any fatigue, chest pain, shortness of breath, HA or dizziness.  Denies any blurred vision, numbness or tingling.   Past Medical History:  Diagnosis Date   Arthritis    Diabetes mellitus without complication (HCC)    Excessive daytime sleepiness    Glaucoma    High cholesterol    Hypertension    Mitral regurgitation    mild by echo 03/2017   OSA (obstructive sleep apnea) 10/18/2015   Mild OSA with AHI 12.5/hr on CPAP at 8cm H2O   Pneumonia    Shingles    TIA (transient ischemic attack)     Past Surgical History:  Procedure Laterality Date   ABDOMINAL SURGERY     CANTHOPLASTY Right 03/30/2022   Procedure: CANTHOPLASTY;  Surgeon: Delia Chimes, MD;  Location: [place];  Service: Ophthalmology;  Laterality: Right;   EYE EXAMINATION UNDER ANESTHESIA Right 03/30/2022   Procedure: EYE EXAM UNDER ANESTHESIA;  Surgeon: Delia Chimes, MD;  Location: [place];  Service: Ophthalmology;  Laterality: Right;   EYE SURGERY Bilateral    cataract surgery   ORIF ORBITAL FRACTURE Right 03/30/2022   Procedure: ORBITAL FLOOR FRACTURE REPAIR WITH IMPLANT;  Surgeon: Delia Chimes, MD;  Location: [place];  Service: Ophthalmology;  Laterality: Right;   ROTATOR CUFF REPAIR     bilateral   TEE WITHOUT CARDIOVERSION N/A 07/20/2016   Procedure: TRANSESOPHAGEAL ECHOCARDIOGRAM (TEE);  Surgeon: Pixie Casino, MD;  Location: [place];  Service: Cardiovascular;  Laterality: N/A;   TOOTH EXTRACTION Left 10/09/2019   Procedure: DENTAL EXTRACTION X1 and Irrigation and Debridement.;  Surgeon: Diona Browner, DDS;  Location: [place];  Service: Oral Surgery;  Laterality: Left;  DENTAL EXTRACTION X1 and Irrigation and Debridement.    Family History  Problem  Relation Age of Onset   Cancer Father    Cancer Sister    Cancer Brother    Breast cancer Neg Hx     Social History   Socioeconomic History   Marital status: Legally Separated    Spouse name: Not on file   Number of children: Not on file   Years of education: Not on file   Highest education level: Not on file   Occupational History   Not on file  Tobacco Use   Smoking status: Never   Smokeless tobacco: Never  Vaping Use   Vaping Use: Never used  Substance and Sexual Activity   Alcohol use: No   Drug use: No   Sexual activity: Not Currently  Other Topics Concern   Not on file  Social History Narrative   Not on file   Social Determinants of Health   Financial Resource Strain: Low Risk    Difficulty of Paying Living Expenses: Not hard at all  Food Insecurity: Food Insecurity Present   Worried About Charity fundraiser in the Last Year: Never true   [place] in the Last Year: Sometimes true  Transportation Needs: No Transportation Needs   Lack of Transportation (Medical): No   Lack of Transportation (Non-Medical): No  Physical Activity: Sufficiently Active   Days of Exercise per Week: 7 days   Minutes of Exercise per Session: 150+ min  Stress: No Stress Concern Present   Feeling of Stress : Only a little  Social Connections: Moderately Integrated   Frequency of Communication with Friends and Family: More than three times a week   Frequency of Social Gatherings with Friends and Family: Once a week   Attends Religious Services: More than 4 times per year   Active Member of [employer] or Organizations: Yes   Attends Music therapist: More than 4 times per year   Marital Status: Separated  Intimate Partner Violence: Not At Risk   Fear of Current or Ex-Partner: No   Emotionally Abused: No   Physically Abused: No   Sexually Abused: No    Outpatient Medications Prior to Visit  Medication Sig Dispense Refill   ACCU-CHEK AVIVA PLUS test strip Reported on 02/24/2016 100 each 1   Accu-Chek Softclix Lancets lancets Check BS once or twice a day. 100 each 1   acetaminophen (TYLENOL) 500 MG tablet Take 1,000 mg by mouth every 6 (six) hours as needed for moderate pain or headache.     amLODipine (NORVASC) 5 MG tablet TAKE 2 TABLETS(10 MG) BY MOUTH DAILY 180 tablet 0   Blood  Glucose Monitoring Suppl (ACCU-CHEK AVIVA PLUS) w/Device KIT 1 kit by Does not apply route as directed. Check 1 to 2 times daily 1 kit 0   Blood Pressure Monitor KIT 1 kit by Does not apply route daily. 1 kit 0   brimonidine-timolol (COMBIGAN) 0.2-0.5 % ophthalmic solution Place 1 drop into both eyes daily.     cycloSPORINE (RESTASIS) 0.05 % ophthalmic emulsion Place 1 drop into both eyes 2 (two) times daily.     ferrous sulfate 325 (65 FE) MG tablet Take 325 mg by mouth daily.      gabapentin (NEURONTIN) 300 MG capsule TAKE 1 CAPSULE BY MOUTH EVERY NIGHT AT BEDTIME 90 capsule 3   latanoprost (XALATAN) 0.005 % ophthalmic solution 1 drop at bedtime.     Melatonin 10 MG TABS Take 10 mg by mouth at bedtime.     Multiple Vitamins-Minerals (MULTIVITAMIN WITH MINERALS)  tablet Take 1 tablet by mouth daily.     Omega-3 Fatty Acids (FISH OIL) 1000 MG CAPS Take 1,000 mg by mouth daily.     ondansetron (ZOFRAN-ODT) 4 MG disintegrating tablet Take 1 tablet (4 mg total) by mouth every 8 (eight) hours as needed for nausea or vomiting. 10 tablet 0   rosuvastatin (CRESTOR) 20 MG tablet TAKE 1 TABLET(20 MG) BY MOUTH DAILY 90 tablet 0   sitaGLIPtin (JANUVIA) 50 MG tablet TAKE 1 TABLET(50 MG) BY MOUTH DAILY (Patient taking differently: Take 50 mg by mouth daily.) 90 tablet 3   oxyCODONE-acetaminophen (PERCOCET) 5-325 MG tablet Take 1 tablet by mouth every 4 (four) hours as needed for severe pain. 20 tablet 0   No facility-administered medications prior to visit.    Allergies  Allergen Reactions   Aspirin     Tremor    Penicillins Itching and Other (See Comments)    Reaction: itching,headache, dizziness and anxiety. Feels like she is "goin off" Did it involve swelling of the face/tongue/throat, SOB, or low BP? No Did it involve sudden or severe rash/hives, skin peeling, or any reaction on the inside of your mouth or nose? No Did you need to seek medical attention at a hospital or doctor's office? No When did  it last happen?      Childhood If all above answers are "NO", may proceed with cephalosporin use.   Nsaids Anxiety    Reaction: causes dizziness and headache. Ibuprofen. Aspirin    Review of Systems  Constitutional:  Negative for chills, fever and malaise/fatigue.  HENT: Negative.    Eyes:  Positive for blurred vision. Negative for double vision, photophobia, pain, discharge and redness.  Respiratory:  Negative for cough and shortness of breath.   Cardiovascular:  Negative for chest pain, palpitations and leg swelling.  Gastrointestinal:  Negative for abdominal pain, blood in stool, constipation, diarrhea, nausea and vomiting.  Musculoskeletal:  Positive for falls.  Skin: Negative.   Neurological: Negative.   Psychiatric/Behavioral:  Negative for depression. The patient has insomnia. The patient is not nervous/anxious.   All other systems reviewed and are negative.     Objective:    Physical Exam Vitals reviewed.  Constitutional:      General: She is not in acute distress.    Appearance: Normal appearance.  HENT:     Head: Normocephalic.  Cardiovascular:     Rate and Rhythm: Normal rate and regular rhythm.     Pulses: Normal pulses.     Heart sounds: Normal heart sounds.     Comments: No obvious peripheral edema Pulmonary:     Effort: Pulmonary effort is normal.     Breath sounds: Normal breath sounds.  Skin:    General: Skin is warm and dry.     Capillary Refill: Capillary refill takes less than 2 seconds.  Neurological:     Mental Status: She is alert.  Psychiatric:        Mood and Affect: Mood normal.        Behavior: Behavior normal.        Thought Content: Thought content normal.        Judgment: Judgment normal.    BP (!) 148/60 (BP Location: Right Arm, Patient Position: Sitting, Cuff Size: Normal)   Pulse 82   Temp 97.9 F (36.6 C)   Ht $R'5\' 4"'vR$  (1.626 m)   Wt 151 lb 3.2 oz (68.6 kg)   SpO2 98%   BMI 25.95 kg/m  Wt Readings from Last  3 Encounters:   04/17/22 151 lb 3.2 oz (68.6 kg)  03/30/22 150 lb (68 kg)  03/09/22 149 lb 4 oz (67.7 kg)    Immunization History  Administered Date(s) Administered   Influenza, High Dose Seasonal PF 09/23/2018   Influenza,inj,Quad PF,6+ Mos 09/23/2020   Influenza-Unspecified 06/19/2013   PFIZER(Purple Top)SARS-COV-2 Vaccination 04/28/2020, 05/27/2020   Pneumococcal Conjugate-13 02/24/2015   Pneumococcal Polysaccharide-23 03/05/2016   Tdap 02/24/2015    Diabetic Foot Exam - Simple   No data filed     Lab Results  Component Value Date   TSH 1.27 03/05/2016   Lab Results  Component Value Date   WBC 6.1 04/17/2022   HGB 12.6 04/17/2022   HCT 38.1 04/17/2022   MCV 96 04/17/2022   PLT 280 04/17/2022   Lab Results  Component Value Date   NA 145 (H) 04/17/2022   K 3.3 (L) 04/17/2022   CO2 28 04/17/2022   GLUCOSE 114 (H) 04/17/2022   BUN 14 04/17/2022   CREATININE 0.79 04/17/2022   BILITOT 0.3 04/17/2022   ALKPHOS 63 04/17/2022   AST 14 04/17/2022   ALT 9 04/17/2022   PROT 7.3 04/17/2022   ALBUMIN 4.7 04/17/2022   CALCIUM 10.1 04/17/2022   ANIONGAP 8 03/05/2022   EGFR 77 04/17/2022   Lab Results  Component Value Date   CHOL 193 01/26/2022   CHOL 213 (H) 09/22/2021   CHOL 234 (H) 03/24/2021   Lab Results  Component Value Date   HDL 61 01/26/2022   HDL 85 09/22/2021   HDL 73 03/24/2021   Lab Results  Component Value Date   LDLCALC 103 (H) 01/26/2022   LDLCALC 100 (H) 09/22/2021   LDLCALC 130 (H) 03/24/2021   Lab Results  Component Value Date   TRIG 167 (H) 01/26/2022   TRIG 166 (H) 09/22/2021   TRIG 178 (H) 03/24/2021   Lab Results  Component Value Date   CHOLHDL 3.2 01/26/2022   CHOLHDL 2.5 09/22/2021   CHOLHDL 3.2 03/24/2021   Lab Results  Component Value Date   HGBA1C 6.6 (A) 01/26/2022   HGBA1C 6.6 01/26/2022   HGBA1C 6.6 (A) 01/26/2022   HGBA1C 6.6 01/26/2022       Assessment & Plan:   Problem List Items Addressed This Visit        Cardiovascular and Mediastinum   Hypertension - Primary   Relevant Medications   lisinopril (ZESTRIL) 20 MG tablet, initiated during visit Encouraged continued diet and exercise efforts  Encouraged continued compliance with medication     Other Visit Diagnoses     Fall, initial encounter       Relevant Orders   DME Wheelchair electric Encouraged family to request additional assistance at home due to increased risk of falls    Weakness       Relevant Orders   CBC with Differential/Platelet (Completed)   Comprehensive metabolic panel (Completed) Encouraged to maintain balanced diet  Discussed non pharmacological methods for management of symptoms Informed to take OTC medications as needed    Follow up in 3 mths for reevaluation of HTN and falls, sooner as needed     I have discontinued Elvia Collum. Smaldone's oxyCODONE-acetaminophen. I am also having her start on lisinopril. Additionally, I am having her maintain her ferrous sulfate, cycloSPORINE, Accu-Chek Aviva Plus, Accu-Chek Softclix Lancets, Accu-Chek Aviva Plus, Blood Pressure Monitor, sitaGLIPtin, ondansetron, acetaminophen, Fish Oil, multivitamin with minerals, Melatonin, brimonidine-timolol, latanoprost, amLODipine, rosuvastatin, and gabapentin.  Meds ordered this encounter  Medications   lisinopril (  ZESTRIL) 20 MG tablet    Sig: Take 1 tablet (20 mg total) by mouth daily.    Dispense:  90 tablet    Refill:  3     Teena Dunk, NP

## 2022-04-17 NOTE — Patient Instructions (Signed)
You were seen today in the [facility] for reevaluation of HTN and falls. Labs were collected, results will be available via MyChart or, if abnormal, you will be contacted by clinic staff. You were prescribed medications, please take as directed. Please follow up in 3 mths for reevaluation of HTN and falls

## 2022-04-18 LAB — CBC WITH DIFFERENTIAL/PLATELET
Basophils Absolute: 0 10*3/uL (ref 0.0–0.2)
Basos: 0 %
EOS (ABSOLUTE): 0.1 10*3/uL (ref 0.0–0.4)
Eos: 2 %
Hematocrit: 38.1 % (ref 34.0–46.6)
Hemoglobin: 12.6 g/dL (ref 11.1–15.9)
Immature Grans (Abs): 0 10*3/uL (ref 0.0–0.1)
Immature Granulocytes: 0 %
Lymphocytes Absolute: 2.8 10*3/uL (ref 0.7–3.1)
Lymphs: 46 %
MCH: 31.7 pg (ref 26.6–33.0)
MCHC: 33.1 g/dL (ref 31.5–35.7)
MCV: 96 fL (ref 79–97)
Monocytes Absolute: 0.4 10*3/uL (ref 0.1–0.9)
Monocytes: 7 %
Neutrophils Absolute: 2.8 10*3/uL (ref 1.4–7.0)
Neutrophils: 45 %
Platelets: 280 10*3/uL (ref 150–450)
RBC: 3.98 x10E6/uL (ref 3.77–5.28)
RDW: 13.1 % (ref 11.7–15.4)
WBC: 6.1 10*3/uL (ref 3.4–10.8)

## 2022-04-18 LAB — COMPREHENSIVE METABOLIC PANEL
ALT: 9 IU/L (ref 0–32)
AST: 14 IU/L (ref 0–40)
Albumin/Globulin Ratio: 1.8 (ref 1.2–2.2)
Albumin: 4.7 g/dL (ref 3.7–4.7)
Alkaline Phosphatase: 63 IU/L (ref 44–121)
BUN/Creatinine Ratio: 18 (ref 12–28)
BUN: 14 mg/dL (ref 8–27)
Bilirubin Total: 0.3 mg/dL (ref 0.0–1.2)
CO2: 28 mmol/L (ref 20–29)
Calcium: 10.1 mg/dL (ref 8.7–10.3)
Chloride: 97 mmol/L (ref 96–106)
Creatinine, Ser: 0.79 mg/dL (ref 0.57–1.00)
Globulin, Total: 2.6 g/dL (ref 1.5–4.5)
Glucose: 114 mg/dL — ABNORMAL HIGH (ref 70–99)
Potassium: 3.3 mmol/L — ABNORMAL LOW (ref 3.5–5.2)
Sodium: 145 mmol/L — ABNORMAL HIGH (ref 134–144)
Total Protein: 7.3 g/dL (ref 6.0–8.5)
eGFR: 77 mL/min/{1.73_m2} (ref >59)

## 2022-04-19 DIAGNOSIS — M67912 Unspecified disorder of synovium and tendon, left shoulder: Secondary | ICD-10-CM | POA: Diagnosis not present

## 2022-04-20 ENCOUNTER — Encounter: Payer: Self-pay | Admitting: Nurse Practitioner

## 2022-05-28 DIAGNOSIS — M19022 Primary osteoarthritis, left elbow: Secondary | ICD-10-CM | POA: Diagnosis not present

## 2022-05-28 DIAGNOSIS — M25512 Pain in left shoulder: Secondary | ICD-10-CM | POA: Diagnosis not present

## 2022-05-28 DIAGNOSIS — M79602 Pain in left arm: Secondary | ICD-10-CM | POA: Diagnosis not present

## 2022-05-29 NOTE — Telephone Encounter (Signed)
error 

## 2022-06-08 DIAGNOSIS — M25512 Pain in left shoulder: Secondary | ICD-10-CM | POA: Diagnosis not present

## 2022-06-14 DIAGNOSIS — H401121 Primary open-angle glaucoma, left eye, mild stage: Secondary | ICD-10-CM | POA: Diagnosis not present

## 2022-06-14 DIAGNOSIS — M67912 Unspecified disorder of synovium and tendon, left shoulder: Secondary | ICD-10-CM | POA: Diagnosis not present

## 2022-06-14 DIAGNOSIS — H401112 Primary open-angle glaucoma, right eye, moderate stage: Secondary | ICD-10-CM | POA: Diagnosis not present

## 2022-06-14 DIAGNOSIS — M19012 Primary osteoarthritis, left shoulder: Secondary | ICD-10-CM | POA: Diagnosis not present

## 2022-06-22 ENCOUNTER — Other Ambulatory Visit: Payer: Self-pay | Admitting: Nurse Practitioner

## 2022-06-22 DIAGNOSIS — I10 Essential (primary) hypertension: Secondary | ICD-10-CM

## 2022-06-22 DIAGNOSIS — M19012 Primary osteoarthritis, left shoulder: Secondary | ICD-10-CM | POA: Diagnosis not present

## 2022-07-05 ENCOUNTER — Other Ambulatory Visit: Payer: Self-pay | Admitting: Nurse Practitioner

## 2022-07-05 DIAGNOSIS — E785 Hyperlipidemia, unspecified: Secondary | ICD-10-CM

## 2022-07-10 DIAGNOSIS — M19212 Secondary osteoarthritis, left shoulder: Secondary | ICD-10-CM | POA: Diagnosis not present

## 2022-07-18 ENCOUNTER — Ambulatory Visit: Payer: Medicare Other | Admitting: Nurse Practitioner

## 2022-07-24 DIAGNOSIS — H401112 Primary open-angle glaucoma, right eye, moderate stage: Secondary | ICD-10-CM | POA: Diagnosis not present

## 2022-07-24 DIAGNOSIS — H401121 Primary open-angle glaucoma, left eye, mild stage: Secondary | ICD-10-CM | POA: Diagnosis not present

## 2022-07-27 ENCOUNTER — Telehealth: Payer: Self-pay | Admitting: *Deleted

## 2022-07-27 ENCOUNTER — Ambulatory Visit: Payer: Medicare Other | Attending: Cardiology | Admitting: Cardiology

## 2022-07-27 ENCOUNTER — Encounter: Payer: Self-pay | Admitting: Cardiology

## 2022-07-27 VITALS — BP 132/60 | HR 94 | Ht 64.0 in | Wt 153.0 lb

## 2022-07-27 DIAGNOSIS — G4733 Obstructive sleep apnea (adult) (pediatric): Secondary | ICD-10-CM | POA: Diagnosis not present

## 2022-07-27 DIAGNOSIS — I10 Essential (primary) hypertension: Secondary | ICD-10-CM | POA: Diagnosis not present

## 2022-07-27 NOTE — Addendum Note (Signed)
Addended by: Antonieta Iba on: 07/27/2022 09:49 AM   Modules accepted: Orders

## 2022-07-27 NOTE — Patient Instructions (Signed)
Medication Instructions:  Your physician recommends that you continue on your current medications as directed. Please refer to the Current Medication list given to you today.  *If you need a refill on your cardiac medications before your next appointment, please call your pharmacy*  Testing/Procedures: Your physician has recommended that you have a sleep study. This test records several body functions during sleep, including: brain activity, eye movement, oxygen and carbon dioxide blood levels, heart rate and rhythm, breathing rate and rhythm, the flow of air through your mouth and nose, snoring, body muscle movements, and chest and belly movement.  Follow-Up: At [facility], you and your health needs are our priority.  As part of our continuing mission to provide you with exceptional heart care, we have created designated Provider Care Teams.  These Care Teams include your primary Cardiologist (physician) and Advanced Practice Providers (APPs -  Physician Assistants and Nurse Practitioners) who all work together to provide you with the care you need, when you need it.  Follow up with Dr. Radford Pax after your sleep study.  Important Information About Sugar

## 2022-07-27 NOTE — Telephone Encounter (Signed)
Pt seen in the office today with Dr. Radford Pax who ordered an Itamar study. Pt called her daughter so that I could go over the instructions with her daughter by phone. Pt agreeable to signed waiver. Pt and her daughter are aware to not open the box until she has been called with the PIN#   Daughter will help set app on pt's phone.

## 2022-07-27 NOTE — Progress Notes (Signed)
Cardiology Office Note:    Date:  07/27/2022   ID:  Beth Hubbard, Beth Hubbard [birth year], MRN [id number]  PCP:  Teena Dunk, NP  Cardiologist:  None  Sleep Medicine:  Fransico Him, MD  Referring MD: Teena Dunk, NP   Chief Complaint  Patient presents with   Sleep Apnea   Hypertension    History of Present Illness:    Beth Hubbard is a 76 y.o. female with a hx of mild OSA with an AHI of 11.9/hr with oxygen desaturations as low as 83% and is on CPAP at 15cm H2O.   Unfortunately she fell in April and broke some bones in her face and had to have a 7 hours reconstructive surgery and has not been able to tolerate the mask since then.  She is hopeful that she will be able to start using it again but still has problems with her eye and pain around that area.   Past Medical History:  Diagnosis Date   Arthritis    Diabetes mellitus without complication (HCC)    Excessive daytime sleepiness    Glaucoma    High cholesterol    Hypertension    Mitral regurgitation    mild by echo 03/2017   OSA (obstructive sleep apnea) 10/18/2015   Mild OSA with AHI 12.5/hr on CPAP at 8cm H2O   Pneumonia    Shingles    TIA (transient ischemic attack)     Past Surgical History:  Procedure Laterality Date   ABDOMINAL SURGERY     CANTHOPLASTY Right 03/30/2022   Procedure: CANTHOPLASTY;  Surgeon: Delia Chimes, MD;  Location: [place];  Service: Ophthalmology;  Laterality: Right;   EYE EXAMINATION UNDER ANESTHESIA Right 03/30/2022   Procedure: EYE EXAM UNDER ANESTHESIA;  Surgeon: Delia Chimes, MD;  Location: [place];  Service: Ophthalmology;  Laterality: Right;   EYE SURGERY Bilateral    cataract surgery   ORIF ORBITAL FRACTURE Right 03/30/2022   Procedure: ORBITAL FLOOR FRACTURE REPAIR WITH IMPLANT;  Surgeon: Delia Chimes, MD;  Location: [place];  Service: Ophthalmology;  Laterality: Right;   ROTATOR CUFF REPAIR     bilateral   TEE WITHOUT CARDIOVERSION N/A 07/20/2016   Procedure:  TRANSESOPHAGEAL ECHOCARDIOGRAM (TEE);  Surgeon: Pixie Casino, MD;  Location: [place];  Service: Cardiovascular;  Laterality: N/A;   TOOTH EXTRACTION Left 10/09/2019   Procedure: DENTAL EXTRACTION X1 and Irrigation and Debridement.;  Surgeon: Diona Browner, DDS;  Location: [place];  Service: Oral Surgery;  Laterality: Left;  DENTAL EXTRACTION X1 and Irrigation and Debridement.    Current Medications: Current Meds  Medication Sig   ACCU-CHEK AVIVA PLUS test strip Reported on 02/24/2016   Accu-Chek Softclix Lancets lancets Check BS once or twice a day.   acetaminophen (TYLENOL) 500 MG tablet Take 1,000 mg by mouth every 6 (six) hours as needed for moderate pain or headache.   amLODipine (NORVASC) 5 MG tablet TAKE 2 TABLETS(10 MG) BY MOUTH DAILY   Blood Glucose Monitoring Suppl (ACCU-CHEK AVIVA PLUS) w/Device KIT 1 kit by Does not apply route as directed. Check 1 to 2 times daily   Blood Pressure Monitor KIT 1 kit by Does not apply route daily.   brimonidine-timolol (COMBIGAN) 0.2-0.5 % ophthalmic solution Place 1 drop into both eyes daily.   cycloSPORINE (RESTASIS) 0.05 % ophthalmic emulsion Place 1 drop into both eyes 2 (two) times daily.   ferrous sulfate 325 (65 FE) MG tablet Take 325 mg by mouth daily.    gabapentin (  NEURONTIN) 300 MG capsule TAKE 1 CAPSULE BY MOUTH EVERY NIGHT AT BEDTIME   latanoprost (XALATAN) 0.005 % ophthalmic solution 1 drop at bedtime.   lisinopril (ZESTRIL) 20 MG tablet Take 1 tablet (20 mg total) by mouth daily.   Melatonin 10 MG TABS Take 10 mg by mouth at bedtime.   Multiple Vitamins-Minerals (MULTIVITAMIN WITH MINERALS) tablet Take 1 tablet by mouth daily.   Omega-3 Fatty Acids (FISH OIL) 1000 MG CAPS Take 1,000 mg by mouth daily.   ondansetron (ZOFRAN-ODT) 4 MG disintegrating tablet Take 1 tablet (4 mg total) by mouth every 8 (eight) hours as needed for nausea or vomiting.   rosuvastatin (CRESTOR) 20 MG tablet TAKE 1 TABLET(20 MG) BY MOUTH DAILY    sitaGLIPtin (JANUVIA) 50 MG tablet TAKE 1 TABLET(50 MG) BY MOUTH DAILY     Allergies:   Aspirin, Penicillins, and Nsaids   Social History   Socioeconomic History   Marital status: Legally Separated    Spouse name: Not on file   Number of children: Not on file   Years of education: Not on file   Highest education level: Not on file  Occupational History   Not on file  Tobacco Use   Smoking status: Never   Smokeless tobacco: Never  Vaping Use   Vaping Use: Never used  Substance and Sexual Activity   Alcohol use: No   Drug use: No   Sexual activity: Not Currently  Other Topics Concern   Not on file  Social History Narrative   Not on file   Social Determinants of Health   Financial Resource Strain: Low Risk  (08/15/2021)   Overall Financial Resource Strain (CARDIA)    Difficulty of Paying Living Expenses: Not hard at all  Food Insecurity: Food Insecurity Present (08/15/2021)   Hunger Vital Sign    Worried About Running Out of Food in the Last Year: Never true    Ran Out of Food in the Last Year: Sometimes true  Transportation Needs: No Transportation Needs (08/15/2021)   PRAPARE - Hydrologist (Medical): No    Lack of Transportation (Non-Medical): No  Physical Activity: Sufficiently Active (08/15/2021)   Exercise Vital Sign    Days of Exercise per Week: 7 days    Minutes of Exercise per Session: 150+ min  Stress: No Stress Concern Present (08/15/2021)   [place]    Feeling of Stress : Only a little  Social Connections: Moderately Integrated (08/15/2021)   Social Connection and Isolation Panel [NHANES]    Frequency of Communication with Friends and Family: More than three times a week    Frequency of Social Gatherings with Friends and Family: Once a week    Attends Religious Services: More than 4 times per year    Active Member of [employer] or Organizations: Yes    Attends Programme researcher, broadcasting/film/video: More than 4 times per year    Marital Status: Separated     Family History: The patient's family history includes Cancer in her brother, father, and sister. There is no history of Breast cancer.  ROS:   Please see the history of present illness.    ROS  All other systems reviewed and negative.   EKGs/Labs/Other Studies Reviewed:    The following studies were reviewed today: PAP compliance download  EKG:  EKG is not ordered today   Recent Labs: 03/05/2022: Magnesium 2.2 04/17/2022: ALT 9; BUN 14; Creatinine, Ser 0.79;  Hemoglobin 12.6; Platelets 280; Potassium 3.3; Sodium 145   Recent Lipid Panel    Component Value Date/Time   CHOL 193 01/26/2022 1031   TRIG 167 (H) 01/26/2022 1031   HDL 61 01/26/2022 1031   CHOLHDL 3.2 01/26/2022 1031   CHOLHDL 2.7 04/08/2017 0909   VLDL 27 04/08/2017 0909   LDLCALC 103 (H) 01/26/2022 1031    Physical Exam:    VS:  BP 132/60   Pulse 94   Ht 5' 4"  (1.626 m)   Wt 153 lb (69.4 kg)   SpO2 95%   BMI 26.26 kg/m     Wt Readings from Last 3 Encounters:  07/27/22 153 lb (69.4 kg)  04/17/22 151 lb 3.2 oz (68.6 kg)  03/30/22 150 lb (68 kg)     GEN: Well nourished, well developed in no acute distress HEENT: Normal NECK: No JVD; No carotid bruits LYMPHATICS: No lymphadenopathy CARDIAC:RRR, no murmurs, rubs, gallops RESPIRATORY:  Clear to auscultation without rales, wheezing or rhonchi  ABDOMEN: Soft, non-tender, non-distended MUSCULOSKELETAL:  No edema; No deformity  SKIN: Warm and dry NEUROLOGIC:  Alert and oriented x 3 PSYCHIATRIC:  Normal affect  ASSESSMENT:    1. OSA (obstructive sleep apnea)   2. Essential hypertension    PLAN:    In order of problems listed above:  1.  OSA  -she has not been able to use her CPAP for the past 6 months due to a fall with fracture facial bones involving her eye requiring extensive surger -she does not think she will be able to tolerate the mask going forward.  -I  will get an Itamar sleep study to see how much OSA she has  2.  HTN -BP is adequately controlled on exam today -Continue prescription drug management with amlodipine 5 mg daily with as needed refills    Medication Adjustments/Labs and Tests Ordered: Current medicines are reviewed at length with the patient today.  Concerns regarding medicines are outlined above.  No orders of the defined types were placed in this encounter.  No orders of the defined types were placed in this encounter.   Signed, Fransico Him, MD  07/27/2022 9:37 AM    [place]

## 2022-07-30 NOTE — Telephone Encounter (Signed)
Prior Authorization for ITAMAR sent to [employer] via Phone. Reference # . NO PA REQ 

## 2022-07-30 NOTE — Telephone Encounter (Signed)
DPR ok to s/w the pt's daughter Beth Hubbard who has been given PIN# [id number] for the pt's Beth Hubbard sleep study. Daughter states pt can do sleep study one night this week.   Called and made the patient aware that she may proceed with the [facility] Sleep Study. PIN # provided to the patient. Patient made aware that she will be contacted after the test has been read with the results and any recommendations. Patient verbalized understanding and thanked me for the call.

## 2022-08-11 ENCOUNTER — Encounter (HOSPITAL_BASED_OUTPATIENT_CLINIC_OR_DEPARTMENT_OTHER): Payer: Medicare Other | Admitting: Cardiology

## 2022-08-11 DIAGNOSIS — G4719 Other hypersomnia: Secondary | ICD-10-CM

## 2022-08-13 ENCOUNTER — Ambulatory Visit: Payer: Medicare Other | Attending: Cardiology

## 2022-08-13 DIAGNOSIS — G4733 Obstructive sleep apnea (adult) (pediatric): Secondary | ICD-10-CM

## 2022-08-13 NOTE — Procedures (Signed)
   Patient Information Study Date: 08/11/22 Patient Name: Beth Hubbard Patient ID: [id number] Birth Date: Aug 18, 2046 Age: 76 Gender: Female BMI: 26.0 (W=152 lb, H=5' 4'') Stopbang: 4 Referring Physician: Fransico Him, MD  TEST DESCRIPTION: Home sleep apnea testing was completed using the WatchPat, a Type 1 device, utilizing peripheral arterial tonometry (PAT), chest movement, actigraphy, pulse oximetry, pulse rate, body position and snore. AHI was calculated with apnea and hypopnea using valid sleep time as the denominator. RDI includes apneas, hypopneas, and RERAs. The data acquired and the scoring of sleep and all associated events were performed in accordance with the recommended standards and specifications as outlined in the AASM Manual for the Scoring of Sleep and Associated Events 2.2.0 (2015).   FINDINGS: 1.  No evidence of Obstructive Sleep Apnea with AHI 4.8/hr.  2.  No Central Sleep Apnea. 3.  Oxygen desaturations as low as 84%. 4.  Severe snoring was present. O2 sats were < 88% for 0.7 minutes. 5.  Total sleep time was 5 hrs and 12 min. 6.  12% of total sleep time was spent in REM sleep.  7.  Normal sleep onset latency at 22 min.  8.  Prolonged REM sleep onset latency at 186 min.  9.  Total awakenings were 3.   DIAGNOSIS:  Normal study with no significant sleep disordered breathing.  RECOMMENDATIONS:   1. Normal study with no significant sleep disordered breathing.  2.  Healthy sleep recommendations include:  adequate nightly sleep (normal 7-9 hrs/night), avoidance of caffeine after noon and alcohol near bedtime, and maintaining a sleep environment that is cool, dark and quiet.  3.  Weight loss for overweight patients is recommended.    4.  Snoring recommendations include:  weight loss where appropriate, side sleeping, and avoidance of alcohol before bed.  5.  Operation of motor vehicle or dangerous equipment must be avoided when feeling drowsy, excessively sleepy, or  mentally fatigued.    6.  An ENT consultation which may be useful for specific causes of and possible treatment of bothersome snoring.   7. Weight loss may be of benefit in reducing the severity of snoring.   Signature: Fransico Him, MD; [facility]; [place], [place] Board of Sleep Medicine Electronically Signed: 08/13/22

## 2022-08-14 ENCOUNTER — Telehealth: Payer: Self-pay | Admitting: *Deleted

## 2022-08-14 NOTE — Telephone Encounter (Signed)
-----   Message from Lauralee Evener, [place] sent at 08/13/2022  1:34 PM EDT -----  ----- Message ----- From: Sueanne Margarita, MD Sent: 08/13/2022   1:34 PM EDT To: [facility] Sleep Studies  Please let patient know that sleep study showed no significant sleep apnea.

## 2022-08-14 NOTE — Telephone Encounter (Signed)
The patient/daughter per DPR has been notified of the result and verbalized understanding.  All questions (if any) were answered. Beth Hubbard, [place] 08/14/2022 6:45 PM    Pt is aware and agreeable to normal results.

## 2022-08-18 ENCOUNTER — Emergency Department (HOSPITAL_BASED_OUTPATIENT_CLINIC_OR_DEPARTMENT_OTHER): Payer: Medicare Other

## 2022-08-18 ENCOUNTER — Inpatient Hospital Stay (HOSPITAL_BASED_OUTPATIENT_CLINIC_OR_DEPARTMENT_OTHER)
Admission: EM | Admit: 2022-08-18 | Discharge: 2022-08-20 | DRG: 390 | Disposition: A | Discharge status: Home / Self Care | Payer: Medicare Other | Attending: Family Medicine | Admitting: Family Medicine

## 2022-08-18 ENCOUNTER — Encounter (HOSPITAL_COMMUNITY): Payer: Self-pay

## 2022-08-18 ENCOUNTER — Encounter: Payer: Self-pay | Admitting: Emergency Medicine

## 2022-08-18 ENCOUNTER — Other Ambulatory Visit: Payer: Self-pay

## 2022-08-18 ENCOUNTER — Encounter (HOSPITAL_BASED_OUTPATIENT_CLINIC_OR_DEPARTMENT_OTHER): Payer: Self-pay | Admitting: Emergency Medicine

## 2022-08-18 ENCOUNTER — Ambulatory Visit: Admission: EM | Admit: 2022-08-18 | Discharge: 2022-08-18 | Payer: Medicare Other

## 2022-08-18 DIAGNOSIS — R112 Nausea with vomiting, unspecified: Principal | ICD-10-CM

## 2022-08-18 DIAGNOSIS — E119 Type 2 diabetes mellitus without complications: Secondary | ICD-10-CM

## 2022-08-18 DIAGNOSIS — I10 Essential (primary) hypertension: Secondary | ICD-10-CM | POA: Diagnosis present

## 2022-08-18 DIAGNOSIS — K529 Noninfective gastroenteritis and colitis, unspecified: Secondary | ICD-10-CM

## 2022-08-18 DIAGNOSIS — K56609 Unspecified intestinal obstruction, unspecified as to partial versus complete obstruction: Secondary | ICD-10-CM

## 2022-08-18 DIAGNOSIS — K6389 Other specified diseases of intestine: Secondary | ICD-10-CM | POA: Diagnosis not present

## 2022-08-18 DIAGNOSIS — G4733 Obstructive sleep apnea (adult) (pediatric): Secondary | ICD-10-CM | POA: Diagnosis not present

## 2022-08-18 DIAGNOSIS — I252 Old myocardial infarction: Secondary | ICD-10-CM

## 2022-08-18 DIAGNOSIS — Z880 Allergy status to penicillin: Secondary | ICD-10-CM | POA: Diagnosis not present

## 2022-08-18 DIAGNOSIS — R1084 Generalized abdominal pain: Secondary | ICD-10-CM | POA: Diagnosis not present

## 2022-08-18 DIAGNOSIS — Z20822 Contact with and (suspected) exposure to covid-19: Secondary | ICD-10-CM | POA: Diagnosis present

## 2022-08-18 DIAGNOSIS — R1032 Left lower quadrant pain: Secondary | ICD-10-CM

## 2022-08-18 DIAGNOSIS — Z886 Allergy status to analgesic agent status: Secondary | ICD-10-CM | POA: Diagnosis not present

## 2022-08-18 DIAGNOSIS — H409 Unspecified glaucoma: Secondary | ICD-10-CM | POA: Diagnosis not present

## 2022-08-18 DIAGNOSIS — E785 Hyperlipidemia, unspecified: Secondary | ICD-10-CM | POA: Diagnosis not present

## 2022-08-18 DIAGNOSIS — Z4682 Encounter for fitting and adjustment of non-vascular catheter: Secondary | ICD-10-CM | POA: Diagnosis not present

## 2022-08-18 HISTORY — DX: Hyperlipidemia, unspecified: E78.5

## 2022-08-18 HISTORY — DX: Acute myocardial infarction, unspecified: I21.9

## 2022-08-18 LAB — URINALYSIS, ROUTINE W REFLEX MICROSCOPIC
Bilirubin Urine: NEGATIVE
Glucose, UA: NEGATIVE mg/dL
Hgb urine dipstick: NEGATIVE
Ketones, ur: NEGATIVE mg/dL
Leukocytes,Ua: NEGATIVE
Nitrite: NEGATIVE
Protein, ur: NEGATIVE mg/dL
Specific Gravity, Urine: 1.018 (ref 1.005–1.030)
pH: 6 (ref 5.0–8.0)

## 2022-08-18 LAB — COMPREHENSIVE METABOLIC PANEL
ALT: 17 U/L (ref 0–44)
AST: 16 U/L (ref 15–41)
Albumin: 4.3 g/dL (ref 3.5–5.0)
Alkaline Phosphatase: 45 U/L (ref 38–126)
Anion gap: 12 (ref 5–15)
BUN: 13 mg/dL (ref 8–23)
CO2: 31 mmol/L (ref 22–32)
Calcium: 9.5 mg/dL (ref 8.9–10.3)
Chloride: 101 mmol/L (ref 98–111)
Creatinine, Ser: 0.92 mg/dL (ref 0.44–1.00)
GFR, Estimated: >60 mL/min (ref >60)
Glucose, Bld: 119 mg/dL — ABNORMAL HIGH (ref 70–99)
Potassium: 3.5 mmol/L (ref 3.5–5.1)
Sodium: 144 mmol/L (ref 135–145)
Total Bilirubin: 0.5 mg/dL (ref 0.3–1.2)
Total Protein: 7.2 g/dL (ref 6.5–8.1)

## 2022-08-18 LAB — CBC
HCT: 41.1 % (ref 36.0–46.0)
Hemoglobin: 13.4 g/dL (ref 12.0–15.0)
MCH: 31.8 pg (ref 26.0–34.0)
MCHC: 32.6 g/dL (ref 30.0–36.0)
MCV: 97.4 fL (ref 80.0–100.0)
Platelets: 244 10*3/uL (ref 150–400)
RBC: 4.22 MIL/uL (ref 3.87–5.11)
RDW: 13.3 % (ref 11.5–15.5)
WBC: 10.1 10*3/uL (ref 4.0–10.5)
nRBC: 0 % (ref 0.0–0.2)

## 2022-08-18 LAB — RESP PANEL BY RT-PCR (FLU A&B, COVID) ARPGX2
Influenza A by PCR: NEGATIVE
Influenza B by PCR: NEGATIVE
SARS Coronavirus 2 by RT PCR: NEGATIVE

## 2022-08-18 LAB — LIPASE, BLOOD: Lipase: <10 U/L — ABNORMAL LOW (ref 11–51)

## 2022-08-18 LAB — LACTIC ACID, PLASMA: Lactic Acid, Venous: 1.1 mmol/L (ref 0.5–1.9)

## 2022-08-18 MED ORDER — FENTANYL CITRATE PF 50 MCG/ML IJ SOSY
50.0000 ug | PREFILLED_SYRINGE | Freq: Once | INTRAMUSCULAR | Status: AC
  Administered 2022-08-18: 50 ug via INTRAVENOUS
  Filled 2022-08-18: qty 1

## 2022-08-18 MED ORDER — LABETALOL HCL 5 MG/ML IV SOLN: 10.0000 mg | Freq: Four times a day (QID) | INTRAVENOUS | Status: DC | PRN

## 2022-08-18 MED ORDER — HYDROMORPHONE HCL 1 MG/ML IJ SOLN
1.0000 mg | Freq: Once | INTRAMUSCULAR | Status: AC
  Administered 2022-08-18: 1 mg via INTRAVENOUS
  Filled 2022-08-18: qty 1

## 2022-08-18 MED ORDER — ONDANSETRON HCL 4 MG PO TABS: 4.0000 mg | ORAL_TABLET | Freq: Four times a day (QID) | ORAL | Status: DC | PRN

## 2022-08-18 MED ORDER — IOHEXOL 300 MG/ML  SOLN
80.0000 mL | Freq: Once | INTRAMUSCULAR | Status: AC | PRN
  Administered 2022-08-18: 80 mL via INTRAVENOUS

## 2022-08-18 MED ORDER — SODIUM CHLORIDE 0.45 % IV SOLN
INTRAVENOUS | Status: AC
  Filled 2022-08-18: qty 1000

## 2022-08-18 MED ORDER — FENTANYL CITRATE PF 50 MCG/ML IJ SOSY
50.0000 ug | PREFILLED_SYRINGE | INTRAMUSCULAR | Status: AC | PRN
  Administered 2022-08-18 (×2): 50 ug via INTRAVENOUS
  Filled 2022-08-18 (×2): qty 1

## 2022-08-18 MED ORDER — ONDANSETRON HCL 4 MG/2ML IJ SOLN: 4.0000 mg | Freq: Four times a day (QID) | INTRAMUSCULAR | Status: DC | PRN

## 2022-08-18 MED ORDER — INSULIN ASPART 100 UNIT/ML IJ SOLN: 0.0000 [IU] | INTRAMUSCULAR | Status: DC

## 2022-08-18 MED ORDER — HYDROMORPHONE HCL 1 MG/ML IJ SOLN
0.5000 mg | INTRAMUSCULAR | Status: DC | PRN
  Administered 2022-08-18 – 2022-08-19 (×2): 1 mg via INTRAVENOUS
  Filled 2022-08-18 (×2): qty 1

## 2022-08-18 MED ORDER — LACTATED RINGERS IV BOLUS
1000.0000 mL | Freq: Once | INTRAVENOUS | Status: AC
  Administered 2022-08-18: 1000 mL via INTRAVENOUS

## 2022-08-18 MED ORDER — ONDANSETRON HCL 4 MG/2ML IJ SOLN
4.0000 mg | Freq: Once | INTRAMUSCULAR | Status: AC
  Administered 2022-08-18: 4 mg via INTRAVENOUS
  Filled 2022-08-18: qty 2

## 2022-08-18 MED ORDER — LIDOCAINE HCL URETHRAL/MUCOSAL 2 % EX GEL
1.0000 | Freq: Once | CUTANEOUS | Status: AC
  Administered 2022-08-18: 1
  Filled 2022-08-18: qty 11

## 2022-08-18 NOTE — ED Triage Notes (Signed)
Pt said she has been having nausea and vomiting with abdominal pain since last night. Pt said stomach hurts all over. Pt said she has been vomiting yellow bile. No fevers. No chills. Did get covid and flu shots last Sunday and was feeling a little funny.

## 2022-08-18 NOTE — ED Provider Notes (Signed)
[facility]    CSN: [id number] Arrival date & time: 08/18/22  0825      History   Chief Complaint Chief Complaint  Patient presents with   Nausea   Emesis   Abdominal Cramping    HPI Beth Hubbard is a 76 y.o. female.   Patient here today for evaluation of left sided abdominal pain. She reports initially her pain was generalized but has now localized to left side. She reports that she has significant nausea and vomiting. She notes vomit is yellow bile. She has not had fever or chills. She does not report any suspicious foods- she ate at home once yesterday.   The history is provided by the patient.  Emesis Associated symptoms: abdominal pain   Associated symptoms: no chills and no fever   Abdominal Cramping Associated symptoms include abdominal pain. Pertinent negatives include no shortness of breath.    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   OSA (obstructive sleep apnea) 10/18/2015   Mitral regurgitation 05/18/2015   Excessive daytime sleepiness 02/28/2015   TIA (transient ischemic attack)    Hypertension    Diabetes mellitus without complication ([place])    High cholesterol     Past Surgical History:  Procedure Laterality Date   ABDOMINAL SURGERY     CANTHOPLASTY Right 03/30/2022   Procedure: CANTHOPLASTY;  Surgeon: Delia Chimes, MD;  Location: [place];  Service: Ophthalmology;  Laterality: Right;   EYE  EXAMINATION UNDER ANESTHESIA Right 03/30/2022   Procedure: EYE EXAM UNDER ANESTHESIA;  Surgeon: Delia Chimes, MD;  Location: [place];  Service: Ophthalmology;  Laterality: Right;   EYE SURGERY Bilateral    cataract surgery   ORIF ORBITAL FRACTURE Right 03/30/2022   Procedure: ORBITAL FLOOR FRACTURE REPAIR WITH IMPLANT;  Surgeon: Delia Chimes, MD;  Location: [place];  Service: Ophthalmology;  Laterality: Right;   ROTATOR CUFF REPAIR     bilateral   TEE WITHOUT CARDIOVERSION N/A 07/20/2016   Procedure: TRANSESOPHAGEAL ECHOCARDIOGRAM (TEE);  Surgeon: Pixie Casino, MD;  Location: [place] [place];  Service: Cardiovascular;  Laterality: N/A;   TOOTH EXTRACTION Left 10/09/2019   Procedure: DENTAL EXTRACTION X1 and Irrigation and Debridement.;  Surgeon: Diona Browner, DDS;  Location: [place];  Service: Oral Surgery;  Laterality: Left;  DENTAL EXTRACTION X1 and Irrigation and Debridement.    OB History   No obstetric history on file.      Home Medications    Prior to Admission medications   Medication Sig Start Date End Date Taking? Authorizing Provider  ACCU-CHEK AVIVA PLUS test strip Reported on 02/24/2016 09/23/20   Vevelyn Francois, NP  Accu-Chek Softclix Lancets lancets Check BS once or twice a day. 09/23/20   Vevelyn Francois, NP  acetaminophen (TYLENOL) 500 MG tablet Take 1,000 mg by mouth every 6 (six) hours as needed for moderate pain  or headache.    [provider]  amLODipine (NORVASC) 5 MG tablet TAKE 2 TABLETS(10 MG) BY MOUTH DAILY 06/25/22 09/23/22  Fenton Foy, NP  Blood Glucose Monitoring Suppl (ACCU-CHEK AVIVA PLUS) w/Device KIT 1 kit by Does not apply route as directed. Check 1 to 2 times daily 09/23/20   Vevelyn Francois, NP  Blood Pressure Monitor KIT 1 kit by Does not apply route daily. 03/24/21   Vevelyn Francois, NP  brimonidine-timolol (COMBIGAN) 0.2-0.5 % ophthalmic solution Place 1 drop into both eyes daily.    [provider]  cycloSPORINE (RESTASIS) 0.05 %  ophthalmic emulsion Place 1 drop into both eyes 2 (two) times daily.    [provider]  ferrous sulfate 325 (65 FE) MG tablet Take 325 mg by mouth daily.     [provider]  gabapentin (NEURONTIN) 300 MG capsule TAKE 1 CAPSULE BY MOUTH EVERY NIGHT AT BEDTIME 04/13/22   Passmore, Jake Church I, NP  latanoprost (XALATAN) 0.005 % ophthalmic solution 1 drop at bedtime.    [provider]  lisinopril (ZESTRIL) 20 MG tablet Take 1 tablet (20 mg total) by mouth daily. 04/17/22   Passmore, Jake Church I, NP  Melatonin 10 MG TABS Take 10 mg by mouth at bedtime.    [provider]  Multiple Vitamins-Minerals (MULTIVITAMIN WITH MINERALS) tablet Take 1 tablet by mouth daily.    [provider]  Omega-3 Fatty Acids (FISH OIL) 1000 MG CAPS Take 1,000 mg by mouth daily.    [provider]  ondansetron (ZOFRAN-ODT) 4 MG disintegrating tablet Take 1 tablet (4 mg total) by mouth every 8 (eight) hours as needed for nausea or vomiting. 10/31/21   Fenton Foy, NP  rosuvastatin (CRESTOR) 20 MG tablet TAKE 1 TABLET(20 MG) BY MOUTH DAILY 07/05/22   Fenton Foy, NP  sitaGLIPtin (JANUVIA) 50 MG tablet TAKE 1 TABLET(50 MG) BY MOUTH DAILY 09/22/21   Vevelyn Francois, NP    Family History Family History  Problem Relation Age of Onset   Cancer Father    Cancer Sister    Cancer Brother    Breast cancer Neg Hx     Social History Social History   Tobacco Use   Smoking status: Never   Smokeless tobacco: Never  Vaping Use   Vaping Use: Never used  Substance Use Topics   Alcohol use: No   Drug use: No     Allergies   Aspirin, Penicillins, and Nsaids   Review of Systems Review of Systems  Constitutional:  Negative for chills and fever.  HENT:  Negative for congestion.   Eyes:  Negative for discharge and redness.  Respiratory:  Negative for shortness of breath.   Gastrointestinal:  Positive for abdominal pain, nausea and vomiting.     Physical  Exam Triage Vital Signs ED Triage Vitals  Enc Vitals Group     BP 08/18/22 0853 (!) 144/81     Pulse Rate 08/18/22 0853 72     Resp 08/18/22 0853 (!) 22     Temp 08/18/22 0853 98.3 F (36.8 C)     Temp Source 08/18/22 0853 Oral     SpO2 08/18/22 0853 97 %     Weight --      Height --      Head Circumference --      Peak Flow --      Pain Score 08/18/22 0852 5     Pain Loc --  Pain Edu? --      Excl. in [place]? --    No data found.  Updated Vital Signs BP (!) 144/81 (BP Location: Left Arm)   Pulse 72   Temp 98.3 F (36.8 C) (Oral)   Resp (!) 22   SpO2 97%      Physical Exam Vitals and nursing note reviewed.  Constitutional:      General: She is in acute distress (patient appears to be uncomfortable).  HENT:     Head: Normocephalic.  Eyes:     Conjunctiva/sclera: Conjunctivae normal.  Cardiovascular:     Rate and Rhythm: Normal rate.  Neurological:     Mental Status: She is alert.      UC Treatments / Results  Labs (all labs ordered are listed, but only abnormal results are displayed) Labs Reviewed - No data to display  EKG   Radiology No results found.  Procedures Procedures (including critical care time)  Medications Ordered in UC Medications - No data to display  Initial Impression / Assessment and Plan / UC Course  I have reviewed the triage vital signs and the nursing notes.  Pertinent labs & imaging results that were available during my care of the patient were reviewed by me and considered in my medical decision making (see chart for details).   Patient appears very uncomfortable. Recommended further evaluation in the ED for imaging, stat labs. Patient is agreeable and son who is here with her will drive her via POV to local ED after leaving our office.    Final Clinical Impressions(s) / UC Diagnoses   Final diagnoses:  Pain, abdominal, LLQ   Discharge Instructions   None    ED Prescriptions   None    PDMP not reviewed this  encounter.   Francene Finders, PA-C 08/18/22 [phone]

## 2022-08-18 NOTE — ED Notes (Signed)
Patient is being discharged from the Urgent Care and sent to the Emergency Department via personal vehicle . Per Provider Ewell Poe, patient is in need of higher level of care due to abdominal pain. Patient is aware and verbalizes understanding of plan of care.   Vitals:   08/18/22 0853  BP: (!) 144/81  Pulse: 72  Resp: (!) 22  Temp: 98.3 F (36.8 C)  SpO2: 97%

## 2022-08-18 NOTE — ED Notes (Signed)
Respiratory placed patient on 2lpm [place] due to decreased SpO2 after pain medication administration.

## 2022-08-18 NOTE — Progress Notes (Signed)
Plan of Care Note for accepted transfer   Patient: Beth Hubbard MRN: [id number]   DOA: 08/18/2022  Facility requesting transfer: [facility] Requesting Provider: Dr. Armandina Gemma Reason for transfer: SBO Facility course: 76 yo F w/ PMHx of DM2, HTN, OSA, glaucoma, HLD. Presenting with abdominal pain, N/V. W/u revealed SBO vs enteritis. She is hypertensive, otherwise vital stable. EDP spoke with Dr. Zenia Resides who recommended admission to medicine and she would consult. She was started on fluids, pain control. NGT ordered.   Plan of care: The patient is accepted for admission to Telemetry unit, at [facility]. While holding at [facility], medical decision making responsibilities remain with the [place]. Upon arrival to [facility], [place] will assume care. Thank you.  Author: Jonnie Finner, DO 08/18/2022  Check [email or web address] for on-call coverage.  Nursing staff, Please call [place] number on Amion as soon as patient's arrival, so appropriate admitting provider can evaluate the pt.

## 2022-08-18 NOTE — Consult Note (Signed)
Reason for Consult: Abdominal pain, nausea vomiting Referring Physician: Dr. Jackelyn Poling is an 76 y.o. female.   HPI: Patient is a 76 year old female who comes in secondary to nausea vomiting.  States has been on for 24 hours.  States that she had some no emesis.  States that she had previous lower midline incision secondary to internal bleeding this was 96.  She states that she had normal bowel after abdominal pain  Patient had a CT scan outside ER was thought to have enteritis versus SBO. Patient was then transferred to [facility] ER for further evaluation and management.   Past Medical History:  Diagnosis Date   Diabetes mellitus without complication (HCC)    Heart attack (HCC)    Hyperlipemia    Hypertension     History reviewed. No pertinent surgical history.  Family History  Problem Relation Age of Onset   Cancer Father    Cancer Sister    Cancer Brother    Breast cancer Neg Hx     Social History:  reports that she has never smoked. She has never used smokeless tobacco. She reports that she does not drink alcohol and does not use drugs.  Allergies:  Allergies  Allergen Reactions   Aspirin    Ibuprofen    Penicillins     Medications: I have reviewed the patient's current medications.  Results for orders placed or performed during the hospital encounter of 08/18/22 (from the past 48 hour(s))  Urinalysis, Routine w reflex microscopic Urine, Clean Catch     Status: None   Collection Time: 08/18/22  9:48 AM  Result Value Ref Range   Color, Urine YELLOW YELLOW   APPearance CLEAR CLEAR   Specific Gravity, Urine 1.018 1.005 - 1.030   pH 6.0 5.0 - 8.0   Glucose, UA NEGATIVE NEGATIVE mg/dL   Hgb urine dipstick NEGATIVE NEGATIVE   Bilirubin Urine NEGATIVE NEGATIVE   Ketones, ur NEGATIVE NEGATIVE mg/dL   Protein, ur NEGATIVE NEGATIVE mg/dL   Nitrite NEGATIVE NEGATIVE   Leukocytes,Ua NEGATIVE NEGATIVE    Comment: Performed at [facility], [address] [place], [place] [address]  Lipase, blood     Status: Abnormal   Collection Time: 08/18/22  9:55 AM  Result Value Ref Range   Lipase <10 (L) 11 - 51 U/L    Comment: Performed at [facility], [address] [place], [place] [address]  Comprehensive metabolic panel     Status: Abnormal   Collection Time: 08/18/22  9:55 AM  Result Value Ref Range   Sodium 144 135 - 145 mmol/L   Potassium 3.5 3.5 - 5.1 mmol/L   Chloride 101 98 - 111 mmol/L   CO2 31 22 - 32 mmol/L   Glucose, Bld 119 (H) 70 - 99 mg/dL    Comment: Glucose reference range applies only to samples taken after fasting for at least 8 hours.   BUN 13 8 - 23 mg/dL   Creatinine, Ser 0.98 0.44 - 1.00 mg/dL   Calcium 9.5 8.9 - 11.9 mg/dL   Total Protein 7.2 6.5 - 8.1 g/dL   Albumin 4.3 3.5 - 5.0 g/dL   AST 16 15 - 41 U/L   ALT 17 0 - 44 U/L   Alkaline Phosphatase 45 38 - 126 U/L   Total Bilirubin 0.5 0.3 - 1.2 mg/dL   GFR, Estimated >14 >78 mL/min    Comment: (NOTE) Calculated using the CKD-EPI Creatinine Equation (2021)    Anion  gap 12 5 - 15    Comment: Performed at [facility], [address], [place], [place] [address]  CBC     Status: None   Collection Time: 08/18/22  9:55 AM  Result Value Ref Range   WBC 10.1 4.0 - 10.5 K/uL   RBC 4.22 3.87 - 5.11 MIL/uL   Hemoglobin 13.4 12.0 - 15.0 g/dL   HCT 30.1 60.1 - 09.3 %   MCV 97.4 80.0 - 100.0 fL   MCH 31.8 26.0 - 34.0 pg   MCHC 32.6 30.0 - 36.0 g/dL   RDW 23.5 57.3 - 22.0 %   Platelets 244 150 - 400 K/uL   nRBC 0.0 0.0 - 0.2 %    Comment: Performed at [facility], [address], [place], [place] [address]  Resp Panel by RT-PCR (Flu A&B, Covid) Anterior Nasal Swab     Status: None   Collection Time: 08/18/22 10:36 AM   Specimen: Anterior Nasal Swab  Result Value Ref Range   SARS Coronavirus 2 by RT PCR NEGATIVE NEGATIVE    Comment: (NOTE) SARS-CoV-2 target nucleic acids are NOT  DETECTED.  The SARS-CoV-2 RNA is generally detectable in upper respiratory specimens during the acute phase of infection. The lowest concentration of SARS-CoV-2 viral copies this assay can detect is 138 copies/mL. A negative result does not preclude SARS-Cov-2 infection and should not be used as the sole basis for treatment or other patient management decisions. A negative result may occur with  improper specimen collection/handling, submission of specimen other than nasopharyngeal swab, presence of viral mutation(s) within the areas targeted by this assay, and inadequate number of viral copies(<138 copies/mL). A negative result must be combined with clinical observations, patient history, and epidemiological information. The expected result is Negative.  Fact Sheet for Patients:  BloggerCourse.com  Fact Sheet for Healthcare Providers:  SeriousBroker.it  This test is no t yet approved or cleared by the [place] FDA and  has been authorized for detection and/or diagnosis of SARS-CoV-2 by FDA under an Emergency Use Authorization (EUA). This EUA will remain  in effect (meaning this test can be used) for the duration of the COVID-19 declaration under Section 564(b)(1) of the Act, 21 U.S.C.section 360bbb-3(b)(1), unless the authorization is terminated  or revoked sooner.       Influenza A by PCR NEGATIVE NEGATIVE   Influenza B by PCR NEGATIVE NEGATIVE    Comment: (NOTE) The Xpert Xpress SARS-CoV-2/FLU/RSV plus assay is intended as an aid in the diagnosis of influenza from Nasopharyngeal swab specimens and should not be used as a sole basis for treatment. Nasal washings and aspirates are unacceptable for Xpert Xpress SARS-CoV-2/FLU/RSV testing.  Fact Sheet for Patients: BloggerCourse.com  Fact Sheet for Healthcare Providers: SeriousBroker.it  This test is not yet approved or  cleared by the [place] FDA and has been authorized for detection and/or diagnosis of SARS-CoV-2 by FDA under an Emergency Use Authorization (EUA). This EUA will remain in effect (meaning this test can be used) for the duration of the COVID-19 declaration under Section 564(b)(1) of the Act, 21 U.S.C. section 360bbb-3(b)(1), unless the authorization is terminated or revoked.  Performed at [facility], [address] [place], [place] [address]   Lactic acid, plasma     Status: None   Collection Time: 08/18/22 12:37 PM  Result Value Ref Range   Lactic Acid, Venous 1.1 0.5 - 1.9 mmol/L    Comment: Performed at [facility], [address]  [place], [place] [address]    DG Abd Portable 1V  Result Date: 08/18/2022 CLINICAL DATA:  NG tube placement EXAM: PORTABLE ABDOMEN - 1 VIEW COMPARISON:  None Available. FINDINGS: Tip and side port of enteric tube are noted within the stomach. Bowel gas pattern in the upper abdomen is unremarkable. There is contrast in the pelvocaliceal systems in the kidneys. There is no focal pulmonary consolidation. There are linear densities in the medial lower lung fields suggesting scarring or subsegmental atelectasis. Degenerative changes are noted in right shoulder. Postsurgical changes are noted in proximal left humerus. IMPRESSION: Tip of enteric tube is seen in the stomach. Electronically Signed   By: Elmer Picker M.D.   On: 08/18/2022 15:00   CT ABDOMEN PELVIS W CONTRAST  Result Date: 08/18/2022 CLINICAL DATA:  Nausea and vomiting bowel obstruction suspected EXAM: CT ABDOMEN AND PELVIS WITH CONTRAST TECHNIQUE: Multidetector CT imaging of the abdomen and pelvis was performed using the standard protocol following bolus administration of intravenous contrast. RADIATION DOSE REDUCTION: This exam was performed according to the departmental dose-optimization program which includes automated exposure control, adjustment  of the mA and/or kV according to patient size and/or use of iterative reconstruction technique. CONTRAST:  81mL OMNIPAQUE IOHEXOL 300 MG/ML  SOLN COMPARISON:  None Available. FINDINGS: Lower chest: Mild basilar atelectasis. Hepatobiliary: No focal hepatic lesion. No biliary duct dilatation. Common bile duct is normal. Pancreas: Pancreas is normal. No ductal dilatation. No pancreatic inflammation. Spleen: Normal spleen Adrenals/urinary tract: Adrenal glands and kidneys are normal. The ureters and bladder normal. Stomach/Bowel: The stomach and duodenum normal. Beginning in the proximal small bowel (jejunum) there is a long continuous segment of submucosal edema and mucosal enhancement which extends into the mid small bowel. Mucosal enhancement and submucosal edema well demonstrated on image 62/2 in the upper pelvis. Proximal small bowel edema noted on image 31/2 in the LEFT upper quadrant. The distal small bowel is collapsed. No transition point identified. There is conversion of mesenteric vasculature in the LEFT lower quadrant (image 72/5 which could indicate internal hernia/volvulus. In addition to the small bowel edema there is fluid within leaves of the mesentery. Additionally there is fluid along the LEFT and RIGHT upper pericolic gutter and surrounding the spleen and liver. Small amount fluid the pelvis. No portal venous gas or intraperitoneal free air. Colon and rectosigmoid colon are decompressed. No inflammation identified. Appendix not identified. Vascular/Lymphatic: Abdominal aorta is normal caliber. No periportal or retroperitoneal adenopathy. No pelvic adenopathy. SMA and celiac trunk are patent. Reproductive: Uterus and adnexa unremarkable. Other: Moderate free fluid described in the GI section Musculoskeletal: No aggressive osseous lesion. IMPRESSION: 1. Long segment of small bowel edema and mucosal enhancement involving the entirety of the jejunum. The distal small bowel is collapsed. Differential  includes small bowel obstruction versus small bowel inflammation or infection. No transition point identified. Potential internal hernia/small-bowel volvulus could cause the mucosal edema with abnormal enhancement pattern. No portal venous gas, pneumatosis or intraperitoneal free air. Recommend emergent surgical consultation. 2. Moderate volume of intraperitoneal free fluid related to the small-bowel obstruction/small bowel inflammation as described above. Findings conveyed toJAMES LAWSING on 08/18/2022  at12:11. Electronically Signed   By: Suzy Bouchard M.D.   On: 08/18/2022 12:14    Review of Systems  Constitutional: Negative.   HENT: Negative.    Respiratory: Negative.    Cardiovascular: Negative.   Gastrointestinal:  Positive for abdominal pain.  Neurological: Negative.   All other systems reviewed and are negative.  Blood pressure (!) 162/60,  pulse 73, temperature 98.6 F (37 C), resp. rate 14, height 5\' 3"  (1.6 m), weight 69.7 kg, SpO2 97 %. Physical Exam Constitutional:      Appearance: She is well-developed.     Comments: Conversant No acute distress  HENT:     Head: Normocephalic and atraumatic.  Eyes:     General: Lids are normal. No scleral icterus.    Pupils: Pupils are equal, round, and reactive to light.     Comments: Pupils are equal round and reactive No lid lag Moist conjunctiva  Neck:     Thyroid: No thyromegaly.     Trachea: No tracheal tenderness.     Comments: No cervical lymphadenopathy Cardiovascular:     Rate and Rhythm: Normal rate and regular rhythm.     Heart sounds: No murmur heard. Pulmonary:     Effort: Pulmonary effort is normal.     Breath sounds: Normal breath sounds. No wheezing or rales.  Abdominal:     Tenderness: There is abdominal tenderness in the right lower quadrant and left lower quadrant.     Hernia: No hernia is present.  Musculoskeletal:     Cervical back: Normal range of motion and neck supple.  Skin:    General: Skin is warm.      Findings: No rash.     Nails: There is no clubbing.     Comments: Normal skin turgor  Neurological:     Mental Status: She is alert and oriented to person, place, and time.     Comments: Normal gait and station  Psychiatric:        Mood and Affect: Mood normal.        Thought Content: Thought content normal.        Judgment: Judgment normal.     Comments: Appropriate affect     Assessment/Plan: 76 year old female, SBO versus possible enteritis.  At this time would recommend continue NG tube.  We will hold off on SBO protocol at this time.  Continue with pain control and IV fluids.  73 08/18/2022, 10:44 PM

## 2022-08-18 NOTE — H&P (Addendum)
History and Physical    Beth Hubbard ZOX:[id number] DOB: 08-07-1946 DOA: 08/18/2022  PCP: Pcp, No   Patient coming from: Home   Chief Complaint: Abdominal pain, N/V   HPI: Beth Hubbard is a pleasant 76 y.o. female with medical history significant for type 2 diabetes mellitus, hypertension, hyperlipidemia, and abdominal surgery who presents to the emergency department with abdominal pain, nausea, and vomiting.  Patient reports severe abdominal pain with numerous episodes of nausea and nonbloody vomiting beginning last night.  She had a small bowel movement last night and denies any diarrhea.  She denies fever, chills, chest pain, or shortness of breath.  She denies experiencing this previously.  [facility] ED Course: Upon arrival to the ED, patient is found to be afebrile with stable vitals.  Basic blood work is unremarkable.  COVID and influenza PCR negative.  CT of the abdomen and pelvis demonstrates a long segment of small bowel edema and mucosal enhancement involving the entire jejunum with collapse of distal small bowel.  Surgery was consulted by the ED physician.  NG tube was placed and the patient was given a liter of LR, Dilaudid, fentanyl, and Zofran in the ED.  She was transferred to [facility] for admission.  Review of Systems:  All other systems reviewed and apart from HPI, are negative.  Past Medical History:  Diagnosis Date   Diabetes mellitus without complication (HCC)    Heart attack (HCC)    Hyperlipemia    Hypertension     History reviewed. No pertinent surgical history.  Social History:   reports that she has never smoked. She has never used smokeless tobacco. She reports that she does not drink alcohol and does not use drugs.  Allergies  Allergen Reactions   Aspirin    Ibuprofen    Penicillins     Family History  Problem Relation Age of Onset   Cancer Father    Cancer Sister    Cancer Brother    Breast cancer Neg Hx      Prior to  Admission medications   Not on File    Physical Exam: Vitals:   08/18/22 1840 08/18/22 2000 08/18/22 2140 08/18/22 2141  BP:  (!) 138/47 (!) 162/60   Pulse:  66 73   Resp:  16 14   Temp: 98.5 F (36.9 C) 98.7 F (37.1 C) 98.6 F (37 C)   TempSrc: Oral Oral    SpO2:  100% 97%   Weight:    69.7 kg  Height:    5\' 3"  (1.6 m)    Constitutional: NAD, calm  Eyes: PERTLA, lids and conjunctivae normal ENMT: Mucous membranes are moist. Posterior pharynx clear of any exudate or lesions.   Neck: supple, no masses  Respiratory:  no wheezing, no crackles. No accessory muscle use.  Cardiovascular: S1 & S2 heard, regular rate and rhythm. No extremity edema.  Abdomen: Soft, tender without guarding or rebound pain. Bowel sounds hypoactive.  Musculoskeletal: no clubbing / cyanosis. No joint deformity upper and lower extremities.   Skin: no significant rashes, lesions, ulcers. Warm, dry, well-perfused. Neurologic: CN 2-12 grossly intact. Moving all extremities. Alert and oriented.  Psychiatric: Pleasant. Cooperative.    Labs and Imaging on Admission: I have personally reviewed following labs and imaging studies  CBC: Recent Labs  Lab 08/18/22 0955  WBC 10.1  HGB 13.4  HCT 41.1  MCV 97.4  PLT 244   Basic Metabolic Panel: Recent Labs  Lab 08/18/22 0955  NA 144  K 3.5  CL 101  CO2 31  GLUCOSE 119*  BUN 13  CREATININE 0.92  CALCIUM 9.5   GFR: Estimated Creatinine Clearance: 48.7 mL/min (by C-G formula based on SCr of 0.92 mg/dL). Liver Function Tests: Recent Labs  Lab 08/18/22 0955  AST 16  ALT 17  ALKPHOS 45  BILITOT 0.5  PROT 7.2  ALBUMIN 4.3   Recent Labs  Lab 08/18/22 0955  LIPASE <10*   No results for input(s): "AMMONIA" in the last 168 hours. Coagulation Profile: No results for input(s): "INR", "PROTIME" in the last 168 hours. Cardiac Enzymes: No results for input(s): "CKTOTAL", "CKMB", "CKMBINDEX", "TROPONINI" in the last 168 hours. BNP (last 3  results) No results for input(s): "PROBNP" in the last 8760 hours. HbA1C: No results for input(s): "HGBA1C" in the last 72 hours. CBG: No results for input(s): "GLUCAP" in the last 168 hours. Lipid Profile: No results for input(s): "CHOL", "HDL", "LDLCALC", "TRIG", "CHOLHDL", "LDLDIRECT" in the last 72 hours. Thyroid Function Tests: No results for input(s): "TSH", "T4TOTAL", "FREET4", "T3FREE", "THYROIDAB" in the last 72 hours. Anemia Panel: No results for input(s): "VITAMINB12", "FOLATE", "FERRITIN", "TIBC", "IRON", "RETICCTPCT" in the last 72 hours. Urine analysis:    Component Value Date/Time   COLORURINE YELLOW 08/18/2022 0948   APPEARANCEUR CLEAR 08/18/2022 0948   LABSPEC 1.018 08/18/2022 0948   PHURINE 6.0 08/18/2022 0948   GLUCOSEU NEGATIVE 08/18/2022 0948   HGBUR NEGATIVE 08/18/2022 0948   BILIRUBINUR NEGATIVE 08/18/2022 0948   KETONESUR NEGATIVE 08/18/2022 0948   PROTEINUR NEGATIVE 08/18/2022 0948   NITRITE NEGATIVE 08/18/2022 0948   LEUKOCYTESUR NEGATIVE 08/18/2022 0948   Sepsis Labs: @LABRCNTIP (procalcitonin:4,lacticidven:4) ) Recent Results (from the past 240 hour(s))  Resp Panel by RT-PCR (Flu A&B, Covid) Anterior Nasal Swab     Status: None   Collection Time: 08/18/22 10:36 AM   Specimen: Anterior Nasal Swab  Result Value Ref Range Status   SARS Coronavirus 2 by RT PCR NEGATIVE NEGATIVE Final    Comment: (NOTE) SARS-CoV-2 target nucleic acids are NOT DETECTED.  The SARS-CoV-2 RNA is generally detectable in upper respiratory specimens during the acute phase of infection. The lowest concentration of SARS-CoV-2 viral copies this assay can detect is 138 copies/mL. A negative result does not preclude SARS-Cov-2 infection and should not be used as the sole basis for treatment or other patient management decisions. A negative result may occur with  improper specimen collection/handling, submission of specimen other than nasopharyngeal swab, presence of viral  mutation(s) within the areas targeted by this assay, and inadequate number of viral copies(<138 copies/mL). A negative result must be combined with clinical observations, patient history, and epidemiological information. The expected result is Negative.  Fact Sheet for Patients:  BloggerCourse.com  Fact Sheet for Healthcare Providers:  SeriousBroker.it  This test is no t yet approved or cleared by the [place] FDA and  has been authorized for detection and/or diagnosis of SARS-CoV-2 by FDA under an Emergency Use Authorization (EUA). This EUA will remain  in effect (meaning this test can be used) for the duration of the COVID-19 declaration under Section 564(b)(1) of the Act, 21 U.S.C.section 360bbb-3(b)(1), unless the authorization is terminated  or revoked sooner.       Influenza A by PCR NEGATIVE NEGATIVE Final   Influenza B by PCR NEGATIVE NEGATIVE Final    Comment: (NOTE) The Xpert Xpress SARS-CoV-2/FLU/RSV plus assay is intended as an aid in the diagnosis of influenza from Nasopharyngeal swab specimens and should not be used as a  sole basis for treatment. Nasal washings and aspirates are unacceptable for Xpert Xpress SARS-CoV-2/FLU/RSV testing.  Fact Sheet for Patients: BloggerCourse.com  Fact Sheet for Healthcare Providers: SeriousBroker.it  This test is not yet approved or cleared by the [place] FDA and has been authorized for detection and/or diagnosis of SARS-CoV-2 by FDA under an Emergency Use Authorization (EUA). This EUA will remain in effect (meaning this test can be used) for the duration of the COVID-19 declaration under Section 564(b)(1) of the Act, 21 U.S.C. section 360bbb-3(b)(1), unless the authorization is terminated or revoked.  Performed at [facility], [address] [place], [place] [address]      Radiological  Exams on Admission: DG Abd Portable 1V  Result Date: 08/18/2022 CLINICAL DATA:  NG tube placement EXAM: PORTABLE ABDOMEN - 1 VIEW COMPARISON:  None Available. FINDINGS: Tip and side port of enteric tube are noted within the stomach. Bowel gas pattern in the upper abdomen is unremarkable. There is contrast in the pelvocaliceal systems in the kidneys. There is no focal pulmonary consolidation. There are linear densities in the medial lower lung fields suggesting scarring or subsegmental atelectasis. Degenerative changes are noted in right shoulder. Postsurgical changes are noted in proximal left humerus. IMPRESSION: Tip of enteric tube is seen in the stomach. Electronically Signed   By: Ernie Avena M.D.   On: 08/18/2022 15:00   CT ABDOMEN PELVIS W CONTRAST  Result Date: 08/18/2022 CLINICAL DATA:  Nausea and vomiting bowel obstruction suspected EXAM: CT ABDOMEN AND PELVIS WITH CONTRAST TECHNIQUE: Multidetector CT imaging of the abdomen and pelvis was performed using the standard protocol following bolus administration of intravenous contrast. RADIATION DOSE REDUCTION: This exam was performed according to the departmental dose-optimization program which includes automated exposure control, adjustment of the mA and/or kV according to patient size and/or use of iterative reconstruction technique. CONTRAST:  80mL OMNIPAQUE IOHEXOL 300 MG/ML  SOLN COMPARISON:  None Available. FINDINGS: Lower chest: Mild basilar atelectasis. Hepatobiliary: No focal hepatic lesion. No biliary duct dilatation. Common bile duct is normal. Pancreas: Pancreas is normal. No ductal dilatation. No pancreatic inflammation. Spleen: Normal spleen Adrenals/urinary tract: Adrenal glands and kidneys are normal. The ureters and bladder normal. Stomach/Bowel: The stomach and duodenum normal. Beginning in the proximal small bowel (jejunum) there is a long continuous segment of submucosal edema and mucosal enhancement which extends into the mid  small bowel. Mucosal enhancement and submucosal edema well demonstrated on image 62/2 in the upper pelvis. Proximal small bowel edema noted on image 31/2 in the LEFT upper quadrant. The distal small bowel is collapsed. No transition point identified. There is conversion of mesenteric vasculature in the LEFT lower quadrant (image 72/5 which could indicate internal hernia/volvulus. In addition to the small bowel edema there is fluid within leaves of the mesentery. Additionally there is fluid along the LEFT and RIGHT upper pericolic gutter and surrounding the spleen and liver. Small amount fluid the pelvis. No portal venous gas or intraperitoneal free air. Colon and rectosigmoid colon are decompressed. No inflammation identified. Appendix not identified. Vascular/Lymphatic: Abdominal aorta is normal caliber. No periportal or retroperitoneal adenopathy. No pelvic adenopathy. SMA and celiac trunk are patent. Reproductive: Uterus and adnexa unremarkable. Other: Moderate free fluid described in the GI section Musculoskeletal: No aggressive osseous lesion. IMPRESSION: 1. Long segment of small bowel edema and mucosal enhancement involving the entirety of the jejunum. The distal small bowel is collapsed. Differential includes small bowel obstruction versus small bowel inflammation or infection. No transition point identified. Potential  internal hernia/small-bowel volvulus could cause the mucosal edema with abnormal enhancement pattern. No portal venous gas, pneumatosis or intraperitoneal free air. Recommend emergent surgical consultation. 2. Moderate volume of intraperitoneal free fluid related to the small-bowel obstruction/small bowel inflammation as described above. Findings conveyed toJAMES LAWSING on 08/18/2022  at12:11. Electronically Signed   By: Genevive Bi M.D.   On: 08/18/2022 12:14     Assessment/Plan   1. SBO  - Presents with acute-onset abdominal pain and N/V and has CT findings worrisome for SBO  -  NGT was placed in ED  - Continue bowel-rest, NGT decompression, symptom-management, optimize electrolytes, and follow-up on surgery recommendations    2. Type II DM  - A1c was 6.6% in March 2023  - Check CBGs and use low-intensity SSI for now   3. HTN  - Treat as-needed only for now    DVT prophylaxis: SCDs  Code Status: Full  Level of Care: Level of care: Telemetry Medical Family Communication: none present  Disposition Plan:  Patient is from: home  Anticipated d/c is to: Home  Anticipated d/c date is: 08/20/22  Patient currently: Pending return of bowel function  Consults called: surgery  Admission status: Observation     Briscoe Deutscher, MD [facility]  08/18/2022, 10:48 PM

## 2022-08-18 NOTE — ED Notes (Signed)
Beth Hubbard was placed on 2L O2 via [place] for O2 sats in the low 80s.  Her RN noticed the drop in O2 saturation after the she was given fentanyl.  RT placed her on 2L and her O2 saturation is 99% while she is sleeping.

## 2022-08-18 NOTE — ED Notes (Signed)
Patient transported to CT 

## 2022-08-18 NOTE — ED Triage Notes (Signed)
Left side abd pain, vomiting all night , had flu and covid shot last Sunday, not sure if that is cause, she took them at same time and she felt tired after.

## 2022-08-18 NOTE — ED Provider Notes (Signed)
[facility] EMERGENCY DEPT Provider Note   CSN: [id number] Arrival date & time: 08/18/22  0932     History  Chief Complaint  Patient presents with   Abdominal Pain    Beth Hubbard is a 76 y.o. female.   Abdominal Pain Associated symptoms: nausea and vomiting      76 year old female with medical history significant for DM 2, HTN, HLD, MI who presents to the emergency department with abdominal pain, nausea and vomiting.  The patient states that she developed sudden onset abdominal pain last night and was having vomiting all night.  She underwent flu and COVID shot last Sunday.  She states that she is not currently passing gas.  Her last bowel movement was yesterday and was normal.  She denies any diarrhea.  She endorses multiple episodes of NBNB emesis all throughout the night has been unable to keep anything down.    Home Medications Prior to Admission medications   Not on File      Allergies    Aspirin, Ibuprofen, and Penicillins    Review of Systems   Review of Systems  Gastrointestinal:  Positive for abdominal pain, nausea and vomiting.  All other systems reviewed and are negative.   Physical Exam Updated Vital Signs BP (!) 163/93   Pulse 66   Temp 98.9 F (37.2 C) (Oral)   Resp 14   Ht 5\' 3"  (1.6 m)   SpO2 100%  Physical Exam Vitals and nursing note reviewed.  Constitutional:      General: She is not in acute distress.    Appearance: She is well-developed.  HENT:     Head: Normocephalic and atraumatic.  Eyes:     Conjunctiva/sclera: Conjunctivae normal.  Cardiovascular:     Rate and Rhythm: Normal rate and regular rhythm.     Heart sounds: No murmur heard. Pulmonary:     Effort: Pulmonary effort is normal. No respiratory distress.     Breath sounds: Normal breath sounds.  Abdominal:     Palpations: Abdomen is soft.     Tenderness: There is generalized abdominal tenderness. There is guarding and rebound.  Musculoskeletal:         General: No swelling.     Cervical back: Neck supple.  Skin:    General: Skin is warm and dry.     Capillary Refill: Capillary refill takes less than 2 seconds.  Neurological:     Mental Status: She is alert.  Psychiatric:        Mood and Affect: Mood normal.     ED Results / Procedures / Treatments   Labs (all labs ordered are listed, but only abnormal results are displayed) Labs Reviewed  LIPASE, BLOOD - Abnormal; Notable for the following components:      Result Value   Lipase <10 (*)    All other components within normal limits  COMPREHENSIVE METABOLIC PANEL - Abnormal; Notable for the following components:   Glucose, Bld 119 (*)    All other components within normal limits  RESP PANEL BY RT-PCR (FLU A&B, COVID) ARPGX2  CBC  URINALYSIS, ROUTINE W REFLEX MICROSCOPIC  LACTIC ACID, PLASMA  LACTIC ACID, PLASMA    EKG None  Radiology CT ABDOMEN PELVIS W CONTRAST  Result Date: 08/18/2022 CLINICAL DATA:  Nausea and vomiting bowel obstruction suspected EXAM: CT ABDOMEN AND PELVIS WITH CONTRAST TECHNIQUE: Multidetector CT imaging of the abdomen and pelvis was performed using the standard protocol following bolus administration of intravenous contrast. RADIATION DOSE REDUCTION: This exam  was performed according to the departmental dose-optimization program which includes automated exposure control, adjustment of the mA and/or kV according to patient size and/or use of iterative reconstruction technique. CONTRAST:  51mL OMNIPAQUE IOHEXOL 300 MG/ML  SOLN COMPARISON:  None Available. FINDINGS: Lower chest: Mild basilar atelectasis. Hepatobiliary: No focal hepatic lesion. No biliary duct dilatation. Common bile duct is normal. Pancreas: Pancreas is normal. No ductal dilatation. No pancreatic inflammation. Spleen: Normal spleen Adrenals/urinary tract: Adrenal glands and kidneys are normal. The ureters and bladder normal. Stomach/Bowel: The stomach and duodenum normal. Beginning in the  proximal small bowel (jejunum) there is a long continuous segment of submucosal edema and mucosal enhancement which extends into the mid small bowel. Mucosal enhancement and submucosal edema well demonstrated on image 62/2 in the upper pelvis. Proximal small bowel edema noted on image 31/2 in the LEFT upper quadrant. The distal small bowel is collapsed. No transition point identified. There is conversion of mesenteric vasculature in the LEFT lower quadrant (image 72/5 which could indicate internal hernia/volvulus. In addition to the small bowel edema there is fluid within leaves of the mesentery. Additionally there is fluid along the LEFT and RIGHT upper pericolic gutter and surrounding the spleen and liver. Small amount fluid the pelvis. No portal venous gas or intraperitoneal free air. Colon and rectosigmoid colon are decompressed. No inflammation identified. Appendix not identified. Vascular/Lymphatic: Abdominal aorta is normal caliber. No periportal or retroperitoneal adenopathy. No pelvic adenopathy. SMA and celiac trunk are patent. Reproductive: Uterus and adnexa unremarkable. Other: Moderate free fluid described in the GI section Musculoskeletal: No aggressive osseous lesion. IMPRESSION: 1. Long segment of small bowel edema and mucosal enhancement involving the entirety of the jejunum. The distal small bowel is collapsed. Differential includes small bowel obstruction versus small bowel inflammation or infection. No transition point identified. Potential internal hernia/small-bowel volvulus could cause the mucosal edema with abnormal enhancement pattern. No portal venous gas, pneumatosis or intraperitoneal free air. Recommend emergent surgical consultation. 2. Moderate volume of intraperitoneal free fluid related to the small-bowel obstruction/small bowel inflammation as described above. Findings conveyed toJAMES [name] on 08/18/2022  at12:11. Electronically Signed   By: Suzy Bouchard M.D.   On: 08/18/2022  12:14    Procedures .Critical Care  Performed by: Regan Lemming, MD Authorized by: Regan Lemming, MD   Critical care provider statement:    Critical care time (minutes):  30   Critical care was time spent personally by me on the following activities:  Development of treatment plan with patient or surrogate, discussions with consultants, evaluation of patient's response to treatment, examination of patient, ordering and review of laboratory studies, ordering and review of radiographic studies, ordering and performing treatments and interventions, pulse oximetry, re-evaluation of patient's condition and review of old charts   Care discussed with: admitting provider       Medications Ordered in ED Medications  fentaNYL (SUBLIMAZE) injection 50 mcg (has no administration in time range)  ondansetron (ZOFRAN) injection 4 mg (4 mg Intravenous Given 08/18/22 1059)  fentaNYL (SUBLIMAZE) injection 50 mcg (50 mcg Intravenous Given 08/18/22 1059)  lactated ringers bolus 1,000 mL (0 mLs Intravenous Stopped 08/18/22 1238)  iohexol (OMNIPAQUE) 300 MG/ML solution 80 mL (80 mLs Intravenous Contrast Given 08/18/22 1142)  lidocaine (XYLOCAINE) 2 % jelly 1 Application (1 Application Other Given 08/18/22 1346)    ED Course/ Medical Decision Making/ A&P  Medical Decision Making Amount and/or Complexity of Data Reviewed Labs: ordered. Radiology: ordered.  Risk Prescription drug management. Decision regarding hospitalization.    76 year old female with medical history significant for DM 2, HTN, HLD, MI who presents to the emergency department with abdominal pain, nausea and vomiting.  The patient states that she developed sudden onset abdominal pain last night and was having vomiting all night.  She underwent flu and COVID shot last Sunday.  She states that she is not currently passing gas.  Her last bowel movement was yesterday and was normal.  She denies any diarrhea.  She  endorses multiple episodes of NBNB emesis all throughout the night has been unable to keep anything down.    On arrival, the patient was afebrile, temperature 99.8, not tachycardic or tachypneic, BP 152/84, saturating 100% on room air.  Sinus rhythm noted on cardiac telemetry.  Physical exam significant for diffuse generalized abdominal tenderness to palpation with rebound and guarding present.  Concern for possible small bowel obstruction, versus enteritis, considered mesenteric ischemia, cholecystitis, appendicitis, diverticulitis, nephrolithiasis, UTI, pancreatitis.  Her evaluation significant for COVID-19 influenza PCR testing negative, lactic acid normal, CMP unremarkable, lipase normal, CBC unremarkable, urinalysis negative.  The patient's CT abdomen pelvis resulted as follows: IMPRESSION:  1. Long segment of small bowel edema and mucosal enhancement  involving the entirety of the jejunum. The distal small bowel is  collapsed. Differential includes small bowel obstruction versus  small bowel inflammation or infection. No transition point  identified. Potential internal hernia/small-bowel volvulus could  cause the mucosal edema with abnormal enhancement pattern. No portal  venous gas, pneumatosis or intraperitoneal free air. Recommend  emergent surgical consultation.  2. Moderate volume of intraperitoneal free fluid related to the  small-bowel obstruction/small bowel inflammation as described above.    Findings conveyed toJAMES Basil Blakesley on 08/18/2022  at12:11.    Due to this finding, I did speak with Dr. Sophronia Simas of general surgery on-call who recommended placement of an NG tube, admission to medicine for further evaluation.  She has diffuse abdominal tenderness on repeat exam with rebound and guarding.  Her lactic acid was normal.  Query enteritis versus small bowel obstruction based on presentation, labs, findings.  Dr. Ronaldo Miyamoto of hospitalist medicine was consulted and subsequently excepted  the patient in admission.  An NG tube was placed and the patient was administered fentanyl for pain control.  She was made NPO.  She was administered an IV fluid bolus for volume resuscitation.  She was subsequently admitted in stable condition.   Final Clinical Impression(s) / ED Diagnoses Final diagnoses:  Nausea and vomiting, unspecified vomiting type  Enteritis  SBO (small bowel obstruction) (HCC)    Rx / DC Orders ED Discharge Orders     None         Ernie Avena, MD 08/18/22 1400

## 2022-08-19 DIAGNOSIS — K56609 Unspecified intestinal obstruction, unspecified as to partial versus complete obstruction: Secondary | ICD-10-CM | POA: Diagnosis not present

## 2022-08-19 DIAGNOSIS — H409 Unspecified glaucoma: Secondary | ICD-10-CM | POA: Diagnosis present

## 2022-08-19 DIAGNOSIS — I252 Old myocardial infarction: Secondary | ICD-10-CM | POA: Diagnosis not present

## 2022-08-19 DIAGNOSIS — I10 Essential (primary) hypertension: Secondary | ICD-10-CM | POA: Diagnosis present

## 2022-08-19 DIAGNOSIS — G4733 Obstructive sleep apnea (adult) (pediatric): Secondary | ICD-10-CM | POA: Diagnosis present

## 2022-08-19 DIAGNOSIS — E785 Hyperlipidemia, unspecified: Secondary | ICD-10-CM | POA: Diagnosis present

## 2022-08-19 DIAGNOSIS — K529 Noninfective gastroenteritis and colitis, unspecified: Secondary | ICD-10-CM | POA: Diagnosis present

## 2022-08-19 DIAGNOSIS — E119 Type 2 diabetes mellitus without complications: Secondary | ICD-10-CM | POA: Diagnosis present

## 2022-08-19 DIAGNOSIS — Z880 Allergy status to penicillin: Secondary | ICD-10-CM | POA: Diagnosis not present

## 2022-08-19 DIAGNOSIS — K5289 Other specified noninfective gastroenteritis and colitis: Secondary | ICD-10-CM | POA: Diagnosis not present

## 2022-08-19 DIAGNOSIS — Z20822 Contact with and (suspected) exposure to covid-19: Secondary | ICD-10-CM | POA: Diagnosis present

## 2022-08-19 DIAGNOSIS — Z886 Allergy status to analgesic agent status: Secondary | ICD-10-CM | POA: Diagnosis not present

## 2022-08-19 LAB — BASIC METABOLIC PANEL
Anion gap: 12 (ref 5–15)
BUN: 9 mg/dL (ref 8–23)
CO2: 31 mmol/L (ref 22–32)
Calcium: 9.4 mg/dL (ref 8.9–10.3)
Chloride: 100 mmol/L (ref 98–111)
Creatinine, Ser: 1.07 mg/dL — ABNORMAL HIGH (ref 0.44–1.00)
GFR, Estimated: 54 mL/min — ABNORMAL LOW (ref >60)
Glucose, Bld: 115 mg/dL — ABNORMAL HIGH (ref 70–99)
Potassium: 3.2 mmol/L — ABNORMAL LOW (ref 3.5–5.1)
Sodium: 143 mmol/L (ref 135–145)

## 2022-08-19 LAB — GLUCOSE, CAPILLARY
Glucose-Capillary: 101 mg/dL — ABNORMAL HIGH (ref 70–99)
Glucose-Capillary: 108 mg/dL — ABNORMAL HIGH (ref 70–99)
Glucose-Capillary: 113 mg/dL — ABNORMAL HIGH (ref 70–99)
Glucose-Capillary: 114 mg/dL — ABNORMAL HIGH (ref 70–99)
Glucose-Capillary: 123 mg/dL — ABNORMAL HIGH (ref 70–99)
Glucose-Capillary: 92 mg/dL (ref 70–99)

## 2022-08-19 LAB — CBC
HCT: 38.9 % (ref 36.0–46.0)
Hemoglobin: 12.8 g/dL (ref 12.0–15.0)
MCH: 32.6 pg (ref 26.0–34.0)
MCHC: 32.9 g/dL (ref 30.0–36.0)
MCV: 99 fL (ref 80.0–100.0)
Platelets: 211 10*3/uL (ref 150–400)
RBC: 3.93 MIL/uL (ref 3.87–5.11)
RDW: 13.2 % (ref 11.5–15.5)
WBC: 9.4 10*3/uL (ref 4.0–10.5)
nRBC: 0 % (ref 0.0–0.2)

## 2022-08-19 LAB — MAGNESIUM: Magnesium: 2.1 mg/dL (ref 1.7–2.4)

## 2022-08-19 NOTE — Progress Notes (Signed)
PROGRESS NOTE    Beth Hubbard  QQV:[id number] DOB: [birth year] DOA: 08/18/2022 PCP: Pcp, No  Outpatient Specialists:     Brief Narrative:  As per H&P done on admission: "Beth Hubbard is a pleasant 76 y.o. female with medical history significant for type 2 diabetes mellitus, hypertension, hyperlipidemia, and abdominal surgery who presents to the emergency department with abdominal pain, nausea, and vomiting.  Patient reports severe abdominal pain with numerous episodes of nausea and nonbloody vomiting beginning last night.  She had a small bowel movement last night and denies any diarrhea.  She denies fever, chills, chest pain, or shortness of breath.  She denies experiencing this previously.   [facility] ED Course: Upon arrival to the ED, patient is found to be afebrile with stable vitals.  Basic blood work is unremarkable.  COVID and influenza PCR negative.  CT of the abdomen and pelvis demonstrates a long segment of small bowel edema and mucosal enhancement involving the entire jejunum with collapse of distal small bowel.   Surgery was consulted by the ED physician.  NG tube was placed and the patient was given a liter of LR, Dilaudid, fentanyl, and Zofran in the ED.  She was transferred to [facility] for admission".  08/19/2022: Patient seen.  Surgical input is appreciated.  Small bowel obstruction has improved significantly.  NG tube has been clamped.  Surgery team may clear the patient for discharge by tomorrow i.e. if patient continues to improve.   Assessment & Plan:   Principal Problem:   SBO (small bowel obstruction) (HCC) Active Problems:   Hypertension   Diabetes mellitus without complication (HCC)   1. SBO  - Presents with acute-onset abdominal pain and N/V and has CT findings worrisome for SBO  - NGT was placed in ED  - Continue bowel-rest, NGT decompression, symptom-management, optimize electrolytes, and follow-up on surgery recommendations 08/19/2022: See  above documentation.  SBO has improved significantly.  Surgery team is directing care.   2. Type II DM  - A1c was 6.6% in March 2023  -Continue to monitor blood sugar and optimize.     3. HTN  -Blood pressure control has improved significantly. -Chronic, blood pressure is at goal.      DVT prophylaxis: SCD. Code Status: Full code Family Communication:  Disposition Plan: Home  Consultants:  General surgery  Procedures:  NG tube placement  Antimicrobials:  None.   Subjective: No new complaints. No nausea or vomiting.   Objective: Vitals:   08/18/22 2000 08/18/22 2140 08/18/22 2141 08/19/22 0400  BP: (!) 138/47 (!) 162/60  (!) 136/56  Pulse: 66 73  70  Resp: 16 14  16   Temp: 98.7 F (37.1 C) 98.6 F (37 C)  98.6 F (37 C)  TempSrc: Oral   Oral  SpO2: 100% 97%  100%  Weight:   69.7 kg 70 kg  Height:   5\' 3"  (1.6 m)     Intake/Output Summary (Last 24 hours) at 08/19/2022 1650 Last data filed at 08/19/2022 0818 Gross per 24 hour  Intake 655.01 ml  Output 150 ml  Net 505.01 ml   Filed Weights   08/18/22 2141 08/19/22 0400  Weight: 69.7 kg 70 kg    Examination:  General exam: Appears calm and comfortable.  NG tube is clamped. Respiratory system: Clear to auscultation. Cardiovascular system: S1 & S2  Gastrointestinal system: Abdomen is obese, soft and nontender.   Central nervous system: Alert and oriented. No focal neurological deficits. Extremities: No leg edema.  Data Reviewed: I have personally reviewed following labs and imaging studies  CBC: Recent Labs  Lab 08/18/22 0955 08/19/22 0306  WBC 10.1 9.4  HGB 13.4 12.8  HCT 41.1 38.9  MCV 97.4 99.0  PLT 244 211   Basic Metabolic Panel: Recent Labs  Lab 08/18/22 0955 08/19/22 0306  NA 144 143  K 3.5 3.2*  CL 101 100  CO2 31 31  GLUCOSE 119* 115*  BUN 13 9  CREATININE 0.92 1.07*  CALCIUM 9.5 9.4  MG  --  2.1   GFR: Estimated Creatinine Clearance: 41.9 mL/min (A) (by C-G formula  based on SCr of 1.07 mg/dL (H)). Liver Function Tests: Recent Labs  Lab 08/18/22 0955  AST 16  ALT 17  ALKPHOS 45  BILITOT 0.5  PROT 7.2  ALBUMIN 4.3   Recent Labs  Lab 08/18/22 0955  LIPASE <10*   No results for input(s): "AMMONIA" in the last 168 hours. Coagulation Profile: No results for input(s): "INR", "PROTIME" in the last 168 hours. Cardiac Enzymes: No results for input(s): "CKTOTAL", "CKMB", "CKMBINDEX", "TROPONINI" in the last 168 hours. BNP (last 3 results) No results for input(s): "PROBNP" in the last 8760 hours. HbA1C: No results for input(s): "HGBA1C" in the last 72 hours. CBG: Recent Labs  Lab 08/19/22 0031 08/19/22 0421 08/19/22 0752 08/19/22 1246  GLUCAP 123* 113* 114* 108*   Lipid Profile: No results for input(s): "CHOL", "HDL", "LDLCALC", "TRIG", "CHOLHDL", "LDLDIRECT" in the last 72 hours. Thyroid Function Tests: No results for input(s): "TSH", "T4TOTAL", "FREET4", "T3FREE", "THYROIDAB" in the last 72 hours. Anemia Panel: No results for input(s): "VITAMINB12", "FOLATE", "FERRITIN", "TIBC", "IRON", "RETICCTPCT" in the last 72 hours. Urine analysis:    Component Value Date/Time   COLORURINE YELLOW 08/18/2022 0948   APPEARANCEUR CLEAR 08/18/2022 0948   LABSPEC 1.018 08/18/2022 0948   PHURINE 6.0 08/18/2022 0948   GLUCOSEU NEGATIVE 08/18/2022 0948   HGBUR NEGATIVE 08/18/2022 0948   BILIRUBINUR NEGATIVE 08/18/2022 0948   KETONESUR NEGATIVE 08/18/2022 0948   PROTEINUR NEGATIVE 08/18/2022 0948   NITRITE NEGATIVE 08/18/2022 0948   LEUKOCYTESUR NEGATIVE 08/18/2022 0948   Sepsis Labs: @LABRCNTIP (procalcitonin:4,lacticidven:4)  ) Recent Results (from the past 240 hour(s))  Resp Panel by RT-PCR (Flu A&B, Covid) Anterior Nasal Swab     Status: None   Collection Time: 08/18/22 10:36 AM   Specimen: Anterior Nasal Swab  Result Value Ref Range Status   SARS Coronavirus 2 by RT PCR NEGATIVE NEGATIVE Final    Comment: (NOTE) SARS-CoV-2 target nucleic  acids are NOT DETECTED.  The SARS-CoV-2 RNA is generally detectable in upper respiratory specimens during the acute phase of infection. The lowest concentration of SARS-CoV-2 viral copies this assay can detect is 138 copies/mL. A negative result does not preclude SARS-Cov-2 infection and should not be used as the sole basis for treatment or other patient management decisions. A negative result may occur with  improper specimen collection/handling, submission of specimen other than nasopharyngeal swab, presence of viral mutation(s) within the areas targeted by this assay, and inadequate number of viral copies(<138 copies/mL). A negative result must be combined with clinical observations, patient history, and epidemiological information. The expected result is Negative.  Fact Sheet for Patients:  BloggerCourse.com  Fact Sheet for Healthcare Providers:  SeriousBroker.it  This test is no t yet approved or cleared by the [place] FDA and  has been authorized for detection and/or diagnosis of SARS-CoV-2 by FDA under an Emergency Use Authorization (EUA). This EUA will remain  in effect (  meaning this test can be used) for the duration of the COVID-19 declaration under Section 564(b)(1) of the Act, 21 U.S.C.section 360bbb-3(b)(1), unless the authorization is terminated  or revoked sooner.       Influenza A by PCR NEGATIVE NEGATIVE Final   Influenza B by PCR NEGATIVE NEGATIVE Final    Comment: (NOTE) The Xpert Xpress SARS-CoV-2/FLU/RSV plus assay is intended as an aid in the diagnosis of influenza from Nasopharyngeal swab specimens and should not be used as a sole basis for treatment. Nasal washings and aspirates are unacceptable for Xpert Xpress SARS-CoV-2/FLU/RSV testing.  Fact Sheet for Patients: BloggerCourse.com  Fact Sheet for Healthcare Providers: SeriousBroker.it  This  test is not yet approved or cleared by the [place] FDA and has been authorized for detection and/or diagnosis of SARS-CoV-2 by FDA under an Emergency Use Authorization (EUA). This EUA will remain in effect (meaning this test can be used) for the duration of the COVID-19 declaration under Section 564(b)(1) of the Act, 21 U.S.C. section 360bbb-3(b)(1), unless the authorization is terminated or revoked.  Performed at [facility], [address], [place], [place] [address]          Radiology Studies: DG Abd Portable 1V  Result Date: 08/18/2022 CLINICAL DATA:  NG tube placement EXAM: PORTABLE ABDOMEN - 1 VIEW COMPARISON:  None Available. FINDINGS: Tip and side port of enteric tube are noted within the stomach. Bowel gas pattern in the upper abdomen is unremarkable. There is contrast in the pelvocaliceal systems in the kidneys. There is no focal pulmonary consolidation. There are linear densities in the medial lower lung fields suggesting scarring or subsegmental atelectasis. Degenerative changes are noted in right shoulder. Postsurgical changes are noted in proximal left humerus. IMPRESSION: Tip of enteric tube is seen in the stomach. Electronically Signed   By: Ernie Avena M.D.   On: 08/18/2022 15:00   CT ABDOMEN PELVIS W CONTRAST  Result Date: 08/18/2022 CLINICAL DATA:  Nausea and vomiting bowel obstruction suspected EXAM: CT ABDOMEN AND PELVIS WITH CONTRAST TECHNIQUE: Multidetector CT imaging of the abdomen and pelvis was performed using the standard protocol following bolus administration of intravenous contrast. RADIATION DOSE REDUCTION: This exam was performed according to the departmental dose-optimization program which includes automated exposure control, adjustment of the mA and/or kV according to patient size and/or use of iterative reconstruction technique. CONTRAST:  80mL OMNIPAQUE IOHEXOL 300 MG/ML  SOLN COMPARISON:  None Available. FINDINGS: Lower  chest: Mild basilar atelectasis. Hepatobiliary: No focal hepatic lesion. No biliary duct dilatation. Common bile duct is normal. Pancreas: Pancreas is normal. No ductal dilatation. No pancreatic inflammation. Spleen: Normal spleen Adrenals/urinary tract: Adrenal glands and kidneys are normal. The ureters and bladder normal. Stomach/Bowel: The stomach and duodenum normal. Beginning in the proximal small bowel (jejunum) there is a long continuous segment of submucosal edema and mucosal enhancement which extends into the mid small bowel. Mucosal enhancement and submucosal edema well demonstrated on image 62/2 in the upper pelvis. Proximal small bowel edema noted on image 31/2 in the LEFT upper quadrant. The distal small bowel is collapsed. No transition point identified. There is conversion of mesenteric vasculature in the LEFT lower quadrant (image 72/5 which could indicate internal hernia/volvulus. In addition to the small bowel edema there is fluid within leaves of the mesentery. Additionally there is fluid along the LEFT and RIGHT upper pericolic gutter and surrounding the spleen and liver. Small amount fluid the pelvis. No portal venous gas or intraperitoneal free air. Colon and rectosigmoid  colon are decompressed. No inflammation identified. Appendix not identified. Vascular/Lymphatic: Abdominal aorta is normal caliber. No periportal or retroperitoneal adenopathy. No pelvic adenopathy. SMA and celiac trunk are patent. Reproductive: Uterus and adnexa unremarkable. Other: Moderate free fluid described in the GI section Musculoskeletal: No aggressive osseous lesion. IMPRESSION: 1. Long segment of small bowel edema and mucosal enhancement involving the entirety of the jejunum. The distal small bowel is collapsed. Differential includes small bowel obstruction versus small bowel inflammation or infection. No transition point identified. Potential internal hernia/small-bowel volvulus could cause the mucosal edema with  abnormal enhancement pattern. No portal venous gas, pneumatosis or intraperitoneal free air. Recommend emergent surgical consultation. 2. Moderate volume of intraperitoneal free fluid related to the small-bowel obstruction/small bowel inflammation as described above. Findings conveyed toJAMES LAWSING on 08/18/2022  at12:11. Electronically Signed   By: Genevive Bi M.D.   On: 08/18/2022 12:14        Scheduled Meds:  insulin aspart  0-6 Units Subcutaneous Q4H   Continuous Infusions:   LOS: 0 days    Time spent: 55 minutes.    Berton Mount, MD  [facility] Pager #: [phone] 7PM-7AM contact night coverage as above

## 2022-08-19 NOTE — Plan of Care (Signed)
  Problem: Education: Goal: Knowledge of General Education information will improve Description Including pain rating scale, medication(s)/side effects and non-pharmacologic comfort measures Outcome: Progressing   

## 2022-08-19 NOTE — Progress Notes (Addendum)
Subjective: Patient feels much better today.  No abdominal pain, hungry, passing flatus this am.  NGT with only 150cc  ROS: See above, otherwise other systems negative  Objective: Vital signs in last 24 hours: Temp:  [98.5 F (36.9 C)-99.8 F (37.7 C)] 98.6 F (37 C) (10/01 0400) Pulse Rate:  [65-74] 70 (10/01 0400) Resp:  [10-23] 16 (10/01 0400) BP: (108-163)/(47-93) 136/56 (10/01 0400) SpO2:  [93 %-100 %] 100 % (10/01 0400) Weight:  [69.7 kg-70 kg] 70 kg (10/01 0400) Last BM Date : 08/18/22  Intake/Output from previous day: 09/30 0701 - 10/01 0700 In: 496.2 [I.V.:493.6; IV Piggyback:2.6] Out: -  Intake/Output this shift: Total I/O In: 161.5 [I.V.:161.5] Out: 150 [Emesis/NG output:150]  PE: Gen: NAD Abd: soft, NT, Nd, +BS, NGT with only 150cc of mildly bilious output  Lab Results:  Recent Labs    08/18/22 0955 08/19/22 0306  WBC 10.1 9.4  HGB 13.4 12.8  HCT 41.1 38.9  PLT 244 211   BMET Recent Labs    08/18/22 0955 08/19/22 0306  NA 144 143  K 3.5 3.2*  CL 101 100  CO2 31 31  GLUCOSE 119* 115*  BUN 13 9  CREATININE 0.92 1.07*  CALCIUM 9.5 9.4   PT/INR No results for input(s): "LABPROT", "INR" in the last 72 hours. CMP     Component Value Date/Time   NA 143 08/19/2022 0306   K 3.2 (L) 08/19/2022 0306   CL 100 08/19/2022 0306   CO2 31 08/19/2022 0306   GLUCOSE 115 (H) 08/19/2022 0306   BUN 9 08/19/2022 0306   CREATININE 1.07 (H) 08/19/2022 0306   CALCIUM 9.4 08/19/2022 0306   PROT 7.2 08/18/2022 0955   ALBUMIN 4.3 08/18/2022 0955   AST 16 08/18/2022 0955   ALT 17 08/18/2022 0955   ALKPHOS 45 08/18/2022 0955   BILITOT 0.5 08/18/2022 0955   GFRNONAA 54 (L) 08/19/2022 0306   Lipase     Component Value Date/Time   LIPASE <10 (L) 08/18/2022 0955       Studies/Results: DG Abd Portable 1V  Result Date: 08/18/2022 CLINICAL DATA:  NG tube placement EXAM: PORTABLE ABDOMEN - 1 VIEW COMPARISON:  None Available. FINDINGS: Tip and  side port of enteric tube are noted within the stomach. Bowel gas pattern in the upper abdomen is unremarkable. There is contrast in the pelvocaliceal systems in the kidneys. There is no focal pulmonary consolidation. There are linear densities in the medial lower lung fields suggesting scarring or subsegmental atelectasis. Degenerative changes are noted in right shoulder. Postsurgical changes are noted in proximal left humerus. IMPRESSION: Tip of enteric tube is seen in the stomach. Electronically Signed   By: Ernie Avena M.D.   On: 08/18/2022 15:00   CT ABDOMEN PELVIS W CONTRAST  Result Date: 08/18/2022 CLINICAL DATA:  Nausea and vomiting bowel obstruction suspected EXAM: CT ABDOMEN AND PELVIS WITH CONTRAST TECHNIQUE: Multidetector CT imaging of the abdomen and pelvis was performed using the standard protocol following bolus administration of intravenous contrast. RADIATION DOSE REDUCTION: This exam was performed according to the departmental dose-optimization program which includes automated exposure control, adjustment of the mA and/or kV according to patient size and/or use of iterative reconstruction technique. CONTRAST:  34mL OMNIPAQUE IOHEXOL 300 MG/ML  SOLN COMPARISON:  None Available. FINDINGS: Lower chest: Mild basilar atelectasis. Hepatobiliary: No focal hepatic lesion. No biliary duct dilatation. Common bile duct is normal. Pancreas: Pancreas is normal. No ductal dilatation. No pancreatic inflammation. Spleen:  Normal spleen Adrenals/urinary tract: Adrenal glands and kidneys are normal. The ureters and bladder normal. Stomach/Bowel: The stomach and duodenum normal. Beginning in the proximal small bowel (jejunum) there is a long continuous segment of submucosal edema and mucosal enhancement which extends into the mid small bowel. Mucosal enhancement and submucosal edema well demonstrated on image 62/2 in the upper pelvis. Proximal small bowel edema noted on image 31/2 in the LEFT upper  quadrant. The distal small bowel is collapsed. No transition point identified. There is conversion of mesenteric vasculature in the LEFT lower quadrant (image 72/5 which could indicate internal hernia/volvulus. In addition to the small bowel edema there is fluid within leaves of the mesentery. Additionally there is fluid along the LEFT and RIGHT upper pericolic gutter and surrounding the spleen and liver. Small amount fluid the pelvis. No portal venous gas or intraperitoneal free air. Colon and rectosigmoid colon are decompressed. No inflammation identified. Appendix not identified. Vascular/Lymphatic: Abdominal aorta is normal caliber. No periportal or retroperitoneal adenopathy. No pelvic adenopathy. SMA and celiac trunk are patent. Reproductive: Uterus and adnexa unremarkable. Other: Moderate free fluid described in the GI section Musculoskeletal: No aggressive osseous lesion. IMPRESSION: 1. Long segment of small bowel edema and mucosal enhancement involving the entirety of the jejunum. The distal small bowel is collapsed. Differential includes small bowel obstruction versus small bowel inflammation or infection. No transition point identified. Potential internal hernia/small-bowel volvulus could cause the mucosal edema with abnormal enhancement pattern. No portal venous gas, pneumatosis or intraperitoneal free air. Recommend emergent surgical consultation. 2. Moderate volume of intraperitoneal free fluid related to the small-bowel obstruction/small bowel inflammation as described above. Findings conveyed toJAMES LAWSING on 08/18/2022  at12:11. Electronically Signed   By: Suzy Bouchard M.D.   On: 08/18/2022 12:14    Anti-infectives: Anti-infectives (From admission, onward)    None        Assessment/Plan Enteritis -patient seems to be improving today -clamp NGT for 6 hours and given CLD. If tolerates this can DC NGT -likely have diet advanced tomorrow and DC home if continues to do well.  -sent  message to primary service  FEN - NGT clamped/CLD, ADAT if tolerates the above VTE - ok for from our standpoint ID - none  I reviewed hospitalist notes, last 24 h vitals and pain scores, last 48 h intake and output, last 24 h labs and trends, and last 24 h imaging results.   LOS: 0 days    Henreitta Cea , [facility] Surgery 08/19/2022, 9:29 AM Please see Amion for pager number during day hours 7:00am-4:30pm or 7:00am -11:30am on weekends

## 2022-08-20 ENCOUNTER — Other Ambulatory Visit (HOSPITAL_COMMUNITY): Payer: Self-pay

## 2022-08-20 ENCOUNTER — Encounter: Payer: Self-pay | Admitting: Emergency Medicine

## 2022-08-20 DIAGNOSIS — K56609 Unspecified intestinal obstruction, unspecified as to partial versus complete obstruction: Secondary | ICD-10-CM | POA: Diagnosis not present

## 2022-08-20 DIAGNOSIS — E785 Hyperlipidemia, unspecified: Secondary | ICD-10-CM

## 2022-08-20 LAB — GLUCOSE, CAPILLARY
Glucose-Capillary: 75 mg/dL (ref 70–99)
Glucose-Capillary: 89 mg/dL (ref 70–99)
Glucose-Capillary: 90 mg/dL (ref 70–99)
Glucose-Capillary: 92 mg/dL (ref 70–99)

## 2022-08-20 LAB — MAGNESIUM: Magnesium: 2 mg/dL (ref 1.7–2.4)

## 2022-08-20 LAB — RENAL FUNCTION PANEL
Albumin: 3 g/dL — ABNORMAL LOW (ref 3.5–5.0)
Anion gap: 12 (ref 5–15)
BUN: 10 mg/dL (ref 8–23)
CO2: 29 mmol/L (ref 22–32)
Calcium: 8.9 mg/dL (ref 8.9–10.3)
Chloride: 100 mmol/L (ref 98–111)
Creatinine, Ser: 0.76 mg/dL (ref 0.44–1.00)
GFR, Estimated: >60 mL/min (ref >60)
Glucose, Bld: 88 mg/dL (ref 70–99)
Phosphorus: 3.4 mg/dL (ref 2.5–4.6)
Potassium: 3.3 mmol/L — ABNORMAL LOW (ref 3.5–5.1)
Sodium: 141 mmol/L (ref 135–145)

## 2022-08-20 MED ORDER — AMLODIPINE BESYLATE 10 MG PO TABS
10.0000 mg | ORAL_TABLET | Freq: Every day | ORAL | 3 refills | Status: DC
  Filled 2022-08-20: qty 30, 30d supply, fill #0

## 2022-08-20 MED ORDER — POTASSIUM CHLORIDE CRYS ER 20 MEQ PO TBCR
40.0000 meq | EXTENDED_RELEASE_TABLET | Freq: Once | ORAL | Status: AC
  Administered 2022-08-20: 40 meq via ORAL
  Filled 2022-08-20: qty 2

## 2022-08-20 NOTE — TOC Transition Note (Signed)
Transition of Care [facility]) - CM/SW Discharge Note   Patient Details  Name: Beth Hubbard MRN: [id number] Date of Birth: 14-Aug-1946  Transition of Care [facility]) CM/SW Contact:  Zenon Mayo, RN Phone Number: 08/20/2022, 11:18 AM   Clinical Narrative:    DC today,no needs.         Patient Goals and CMS Choice        Discharge Placement                       Discharge Plan and Services                                     Social Determinants of Health (SDOH) Interventions     Readmission Risk Interventions     No data to display

## 2022-08-20 NOTE — TOC Progression Note (Signed)
Transition of Care [facility]) - Progression Note    Patient Details  Name: Beth Hubbard MRN: [id number] Date of Birth: 03/04/1946  Transition of Care [facility]) CM/SW Contact  Zenon Mayo, RN Phone Number: 08/20/2022, 11:17 AM  Clinical Narrative:    Patient is from home with daughter, SBO, NG tube is out, indep, for dc today, has no needs.         Expected Discharge Plan and Services           Expected Discharge Date: 08/20/22                                     Social Determinants of Health (SDOH) Interventions    Readmission Risk Interventions     No data to display

## 2022-08-20 NOTE — Hospital Course (Addendum)
Beth Hubbard is a 76 y.o. F with DM, HTN, HLD and hx ex-lap for internal bleeding >20 years ago who presented for abdominal pain and vomiting.  CT showed possible SBO.

## 2022-08-20 NOTE — Progress Notes (Signed)
       Subjective: CC: NGT out. Tolerating cld. No n/v. Denies abdominal pain this morning. 2 loose bm's over the last 24 hours. No prn pain or nausea medication in the last 24 hours.   Objective: Vital signs in last 24 hours: Temp:  [98.6 F (37 C)-99.1 F (37.3 C)] 99.1 F (37.3 C) (10/02 1100) Pulse Rate:  [64-77] 68 (10/02 1100) Resp:  [16-20] 17 (10/02 1100) BP: (107-151)/(43-56) 146/55 (10/02 1100) SpO2:  [93 %-100 %] 93 % (10/02 1100) Weight:  [69.2 kg] 69.2 kg (10/02 0000) Last BM Date : 08/18/22  Intake/Output from previous day: 10/01 0701 - 10/02 0700 In: 161.5 [I.V.:161.5] Out: 150 [Emesis/NG output:150] Intake/Output this shift: Total I/O In: 360 [P.O.:360] Out: -   PE: Gen:  Alert, NAD, pleasant Abd: Soft, ND, NT, +BS  Lab Results:  Recent Labs    08/18/22 0955 08/19/22 0306  WBC 10.1 9.4  HGB 13.4 12.8  HCT 41.1 38.9  PLT 244 211   BMET Recent Labs    08/19/22 0306 08/20/22 0043  NA 143 141  K 3.2* 3.3*  CL 100 100  CO2 31 29  GLUCOSE 115* 88  BUN 9 10  CREATININE 1.07* 0.76  CALCIUM 9.4 8.9   PT/INR No results for input(s): "LABPROT", "INR" in the last 72 hours. CMP     Component Value Date/Time   NA 141 08/20/2022 0043   K 3.3 (L) 08/20/2022 0043   CL 100 08/20/2022 0043   CO2 29 08/20/2022 0043   GLUCOSE 88 08/20/2022 0043   BUN 10 08/20/2022 0043   CREATININE 0.76 08/20/2022 0043   CALCIUM 8.9 08/20/2022 0043   PROT 7.2 08/18/2022 0955   ALBUMIN 3.0 (L) 08/20/2022 0043   AST 16 08/18/2022 0955   ALT 17 08/18/2022 0955   ALKPHOS 45 08/18/2022 0955   BILITOT 0.5 08/18/2022 0955   GFRNONAA >60 08/20/2022 0043   Lipase     Component Value Date/Time   LIPASE <10 (L) 08/18/2022 0955    Studies/Results: DG Abd Portable 1V  Result Date: 08/18/2022 CLINICAL DATA:  NG tube placement EXAM: PORTABLE ABDOMEN - 1 VIEW COMPARISON:  None Available. FINDINGS: Tip and side port of enteric tube are noted within the stomach. Bowel  gas pattern in the upper abdomen is unremarkable. There is contrast in the pelvocaliceal systems in the kidneys. There is no focal pulmonary consolidation. There are linear densities in the medial lower lung fields suggesting scarring or subsegmental atelectasis. Degenerative changes are noted in right shoulder. Postsurgical changes are noted in proximal left humerus. IMPRESSION: Tip of enteric tube is seen in the stomach. Electronically Signed   By: Elmer Picker M.D.   On: 08/18/2022 15:00    Anti-infectives: Anti-infectives (From admission, onward)    None        Assessment/Plan SBO vs Enteritis - Lactic and wbc wnl on last check. VSS. NGT out. Tolerating liquids without abdominal pain, n/v. Having bowel function. Exam reassuring, ND/NT. Can ADAT and d/c from our standpoint (appears primary already has d/c order in). We will sign off.     LOS: 1 day    Jillyn Ledger , [facility] Surgery 08/20/2022, 11:56 AM Please see Amion for pager number during day hours 7:00am-4:30pm

## 2022-08-20 NOTE — Discharge Summary (Signed)
Physician Discharge Summary   Patient: Beth Hubbard MRN: [id number] DOB: January 19, 1946  Admit date:     08/18/2022  Discharge date: 08/20/22  Discharge Physician: Edwin Dada   PCP: Pcp, No     Recommendations at discharge:  Follow up BP control with PCP Bo Merino     Discharge Diagnoses: Principal Problem:   SBO (small bowel obstruction) ([place]) Active Problems:   Hypertension   Diabetes mellitus without complication ([place])   Hyperlipidemia      Hospital Course: Beth Hubbard is a 76 y.o. F with DM, HTN, HLD and hx ex-lap for internal bleeding >20 years ago who presented for abdominal pain and vomiting.  CT showed possible SBO.    Small bowel obstruction Patient was admitted and had NG tube placed.  She had resumption of bowel function, and the NG tube was clamped, and she was advanced on her diet, and had no difficulties.  The NG tube was removed, and the patient tolerated soft diet and was comfortable for discharge.  There was some residual possibility that she had simply an enteritis, however she was treated for small bowel obstruction and I suspect that that is what actually was happening given her history of laparotomy years ago.    Hypertension Patient reported that her symptoms of upset stomach, abdominal pain, and vomiting started after beginning lisinopril.  She was instructed to stop this medicine and follow-up with her PCP.  She was given a prescription for amlodipine 10 mg daily            The [place] was reviewed for this patient prior to discharge.  Consultants: General Surgery Procedures performed: NG tube placement, CT abdomen  Disposition: Home    DISCHARGE MEDICATION: Allergies as of 08/20/2022       Reactions   Aspirin    Tremor   Aspirin Other (See Comments)   Ringing in Ear   Ibuprofen Other (See Comments)   Ringing in Ear   Penicillins Itching, Other (See Comments)   Reaction:  itching,headache, dizziness and anxiety. Feels like she is "goin off" Did it involve swelling of the face/tongue/throat, SOB, or low BP? No Did it involve sudden or severe rash/hives, skin peeling, or any reaction on the inside of your mouth or nose? No Did you need to seek medical attention at a hospital or doctor's office? No When did it last happen?      Childhood If all above answers are "NO", may proceed with cephalosporin use.   Penicillins Other (See Comments)   Patient fainted   Nsaids Anxiety   Reaction: causes dizziness and headache. Ibuprofen. Aspirin        Medication List     STOP taking these medications    clindamycin 300 MG capsule Commonly known as: CLEOCIN   erythromycin ophthalmic ointment   lisinopril 20 MG tablet Commonly known as: ZESTRIL       TAKE these medications    acetaminophen 500 MG tablet Commonly known as: TYLENOL Take 1,000 mg by mouth every 6 (six) hours as needed for mild pain.   amLODipine 10 MG tablet Commonly known as: NORVASC Take 1 tablet (10 mg total) by mouth daily. What changed:  medication strength how much to take when to take this   brimonidine-timolol 0.2-0.5 % ophthalmic solution Commonly known as: COMBIGAN Place 1 drop into both eyes 2 (two) times daily.   diphenhydrAMINE 25 MG tablet Commonly known as: BENADRYL Take 25 mg by mouth at  bedtime as needed for sleep.   dorzolamide 2 % ophthalmic solution Commonly known as: TRUSOPT Place 1 drop into both eyes 2 (two) times daily.   Januvia 50 MG tablet Generic drug: sitaGLIPtin Take 50 mg by mouth daily.   ketotifen 0.025 % ophthalmic solution Commonly known as: ZADITOR Place 1 drop into both eyes 2 (two) times daily as needed (itching).   oxymetazoline 0.05 % nasal spray Commonly known as: AFRIN Place 1 spray into both nostrils 2 (two) times daily as needed for congestion.   rosuvastatin 20 MG tablet Commonly known as: CRESTOR Take 20 mg by mouth at  bedtime.        Follow-up Bryce. Go on 08/24/2022.   Specialty: Internal Medicine Why: '@1'$ :40pm Contact information: [place] [phone]                Discharge Instructions     Discharge instructions   Complete by: As directed    You were admitted for a small bowel obstruction (This is when the intestines get "kinked" like we talked about)  You should follow up with your primary doctor on Friday like you told me  For now STOP your lisinopril Discuss the lisinopril and the side effects that you felt when you see your primary doctor  TAKE amlodipine 10 mg daily   Increase activity slowly   Complete by: As directed        Discharge Exam: Filed Weights   08/18/22 2141 08/19/22 0400 08/20/22 0000  Weight: 69.7 kg 70 kg 69.2 kg    General: Pt is alert, awake, not in acute distress Cardiovascular: RRR, nl S1-S2, no murmurs appreciated.   No LE edema.   Respiratory: Normal respiratory rate and rhythm.  CTAB without rales or wheezes. Abdominal: Abdomen soft and non-tender.  No distension or HSM.   Neuro/Psych: Strength symmetric in upper and lower extremities.  Judgment and insight appear normal.   Condition at discharge: good  The results of significant diagnostics from this hospitalization (including imaging, microbiology, ancillary and laboratory) are listed below for reference.   Imaging Studies: DG Abd Portable 1V  Result Date: 08/18/2022 CLINICAL DATA:  NG tube placement EXAM: PORTABLE ABDOMEN - 1 VIEW COMPARISON:  None Available. FINDINGS: Tip and side port of enteric tube are noted within the stomach. Bowel gas pattern in the upper abdomen is unremarkable. There is contrast in the pelvocaliceal systems in the kidneys. There is no focal pulmonary consolidation. There are linear densities in the medial lower lung fields suggesting scarring or subsegmental atelectasis.  Degenerative changes are noted in right shoulder. Postsurgical changes are noted in proximal left humerus. IMPRESSION: Tip of enteric tube is seen in the stomach. Electronically Signed   By: Elmer Picker M.D.   On: 08/18/2022 15:00   CT ABDOMEN PELVIS W CONTRAST  Result Date: 08/18/2022 CLINICAL DATA:  Nausea and vomiting bowel obstruction suspected EXAM: CT ABDOMEN AND PELVIS WITH CONTRAST TECHNIQUE: Multidetector CT imaging of the abdomen and pelvis was performed using the standard protocol following bolus administration of intravenous contrast. RADIATION DOSE REDUCTION: This exam was performed according to the departmental dose-optimization program which includes automated exposure control, adjustment of the mA and/or kV according to patient size and/or use of iterative reconstruction technique. CONTRAST:  19m OMNIPAQUE IOHEXOL 300 MG/ML  SOLN COMPARISON:  None Available. FINDINGS: Lower chest: Mild basilar atelectasis. Hepatobiliary: No focal hepatic lesion. No biliary duct  dilatation. Common bile duct is normal. Pancreas: Pancreas is normal. No ductal dilatation. No pancreatic inflammation. Spleen: Normal spleen Adrenals/urinary tract: Adrenal glands and kidneys are normal. The ureters and bladder normal. Stomach/Bowel: The stomach and duodenum normal. Beginning in the proximal small bowel (jejunum) there is a long continuous segment of submucosal edema and mucosal enhancement which extends into the mid small bowel. Mucosal enhancement and submucosal edema well demonstrated on image 62/2 in the upper pelvis. Proximal small bowel edema noted on image 31/2 in the LEFT upper quadrant. The distal small bowel is collapsed. No transition point identified. There is conversion of mesenteric vasculature in the LEFT lower quadrant (image 72/5 which could indicate internal hernia/volvulus. In addition to the small bowel edema there is fluid within leaves of the mesentery. Additionally there is fluid along the  LEFT and RIGHT upper pericolic gutter and surrounding the spleen and liver. Small amount fluid the pelvis. No portal venous gas or intraperitoneal free air. Colon and rectosigmoid colon are decompressed. No inflammation identified. Appendix not identified. Vascular/Lymphatic: Abdominal aorta is normal caliber. No periportal or retroperitoneal adenopathy. No pelvic adenopathy. SMA and celiac trunk are patent. Reproductive: Uterus and adnexa unremarkable. Other: Moderate free fluid described in the GI section Musculoskeletal: No aggressive osseous lesion. IMPRESSION: 1. Long segment of small bowel edema and mucosal enhancement involving the entirety of the jejunum. The distal small bowel is collapsed. Differential includes small bowel obstruction versus small bowel inflammation or infection. No transition point identified. Potential internal hernia/small-bowel volvulus could cause the mucosal edema with abnormal enhancement pattern. No portal venous gas, pneumatosis or intraperitoneal free air. Recommend emergent surgical consultation. 2. Moderate volume of intraperitoneal free fluid related to the small-bowel obstruction/small bowel inflammation as described above. Findings conveyed toJAMES LAWSING on 08/18/2022  at12:11. Electronically Signed   By: Suzy Bouchard M.D.   On: 08/18/2022 12:14    Microbiology: Results for orders placed or performed during the hospital encounter of 08/18/22  Resp Panel by RT-PCR (Flu A&B, Covid) Anterior Nasal Swab     Status: None   Collection Time: 08/18/22 10:36 AM   Specimen: Anterior Nasal Swab  Result Value Ref Range Status   SARS Coronavirus 2 by RT PCR NEGATIVE NEGATIVE Final    Comment: (NOTE) SARS-CoV-2 target nucleic acids are NOT DETECTED.  The SARS-CoV-2 RNA is generally detectable in upper respiratory specimens during the acute phase of infection. The lowest concentration of SARS-CoV-2 viral copies this assay can detect is 138 copies/mL. A negative result  does not preclude SARS-Cov-2 infection and should not be used as the sole basis for treatment or other patient management decisions. A negative result may occur with  improper specimen collection/handling, submission of specimen other than nasopharyngeal swab, presence of viral mutation(s) within the areas targeted by this assay, and inadequate number of viral copies(<138 copies/mL). A negative result must be combined with clinical observations, patient history, and epidemiological information. The expected result is Negative.  Fact Sheet for Patients:  EntrepreneurPulse.com.au  Fact Sheet for Healthcare Providers:  IncredibleEmployment.be  This test is no t yet approved or cleared by the [place] FDA and  has been authorized for detection and/or diagnosis of SARS-CoV-2 by FDA under an Emergency Use Authorization (EUA). This EUA will remain  in effect (meaning this test can be used) for the duration of the COVID-19 declaration under Section 564(b)(1) of the Act, 21 U.S.C.section 360bbb-3(b)(1), unless the authorization is terminated  or revoked sooner.       Influenza A  by PCR NEGATIVE NEGATIVE Final   Influenza B by PCR NEGATIVE NEGATIVE Final    Comment: (NOTE) The Xpert Xpress SARS-CoV-2/FLU/RSV plus assay is intended as an aid in the diagnosis of influenza from Nasopharyngeal swab specimens and should not be used as a sole basis for treatment. Nasal washings and aspirates are unacceptable for Xpert Xpress SARS-CoV-2/FLU/RSV testing.  Fact Sheet for Patients: EntrepreneurPulse.com.au  Fact Sheet for Healthcare Providers: IncredibleEmployment.be  This test is not yet approved or cleared by the [place] FDA and has been authorized for detection and/or diagnosis of SARS-CoV-2 by FDA under an Emergency Use Authorization (EUA). This EUA will remain in effect (meaning this test can be used) for  the duration of the COVID-19 declaration under Section 564(b)(1) of the Act, 21 U.S.C. section 360bbb-3(b)(1), unless the authorization is terminated or revoked.  Performed at [facility], [address] [place], [place] [address]     Labs: CBC: Recent Labs  Lab 08/18/22 0955 08/19/22 0306  WBC 10.1 9.4  HGB 13.4 12.8  HCT 41.1 38.9  MCV 97.4 99.0  PLT 244 458   Basic Metabolic Panel: Recent Labs  Lab 08/18/22 0955 08/19/22 0306 08/20/22 0040 08/20/22 0043  NA 144 143  --  141  K 3.5 3.2*  --  3.3*  CL 101 100  --  100  CO2 31 31  --  29  GLUCOSE 119* 115*  --  88  BUN 13 9  --  10  CREATININE 0.92 1.07*  --  0.76  CALCIUM 9.5 9.4  --  8.9  MG  --  2.1 2.0  --   PHOS  --   --   --  3.4   Liver Function Tests: Recent Labs  Lab 08/18/22 0955 08/20/22 0043  AST 16  --   ALT 17  --   ALKPHOS 45  --   BILITOT 0.5  --   PROT 7.2  --   ALBUMIN 4.3 3.0*   CBG: Recent Labs  Lab 08/19/22 2018 08/20/22 0010 08/20/22 0400 08/20/22 0745 08/20/22 1057  GLUCAP 92 89 92 75 90    Discharge time spent: approximately 35 minutes spent on discharge counseling, evaluation of patient on day of discharge, and coordination of discharge planning with nursing, social work, pharmacy and case management  Signed: Edwin Dada, MD [facility] 08/20/2022

## 2022-08-21 ENCOUNTER — Encounter: Payer: Self-pay | Admitting: *Deleted

## 2022-08-21 ENCOUNTER — Telehealth: Payer: Self-pay | Admitting: *Deleted

## 2022-08-21 NOTE — Patient Outreach (Signed)
  Care Coordination [facility] Note Transition Care Management Follow-up Telephone Call Date of discharge and from where: Monday, 08/21/22- [place]/ [facility] How have you been since you were released from the hospital? Per caregiver/ [name], on [facility] DPR: "We are staying with her for now until she gets stronger and recuperates fully after being in the hospital.... right now, things are going just fine, and we have no concerns; she is able to do things for herself but we are helping as needed with anything she needs.  We have all of her medications and have looked at the changes and made the changes to her medications like they told us to.  We'll be going to see her PCP on Friday" Any questions or concerns? No  Items Reviewed: Did the pt receive and understand the discharge instructions provided? Yes  Medications obtained and verified? Yes  Other? No  Any new allergies since your discharge? No  Dietary orders reviewed? Yes Do you have support at home? Yes  family members assisting as needed/ indicated post-hospital discharge  Home Care and Equipment/Supplies: Were home health services ordered? no If so, what is the name of the agency? N/A  Has the agency set up a time to come to the patient's home? not applicable Were any new equipment or medical supplies ordered?  No What is the name of the medical supply agency? N/A Were you able to get the supplies/equipment? not applicable Do you have any questions related to the use of the equipment or supplies? No N/A  Functional Questionnaire: (I = Independent and D = Dependent) ADLs: I  family members assisting as needed/ indicated post-hospital discharge  Bathing/Dressing- I  family members assisting as needed/ indicated post-hospital discharge  Meal Prep- I  family members assisting as needed/ indicated post-hospital discharge  Eating- I  Maintaining continence- I  Transferring/Ambulation- I  Managing Meds- I  family members assisting as  needed/ indicated post-hospital discharge  Follow up appointments reviewed:  PCP Hospital f/u appt confirmed? Yes  Scheduled to see PCP, Bo Merino NP on Friday 08/24/22 @ 1:40 pm Specialist Hospital f/u appt confirmed? No  Scheduled to see -- on -- @ -- Are transportation arrangements needed? No  If their condition worsens, is the pt aware to call PCP or go to the Emergency Dept.? Yes Was the patient provided with contact information for the PCP's office or ED? Yes Was to pt encouraged to call back with questions or concerns? Yes  SDOH assessments and interventions completed:   Yes  Care Coordination Interventions Activated:  No   Care Coordination Interventions:  No Care Coordination interventions needed at this time.   Encounter Outcome:  Pt. Visit Completed    Oneta Rack, RN, BSN, CCRN Alumnus RN CM Care Coordination/ Transition of [place] Management [phone]: direct office

## 2022-08-24 ENCOUNTER — Ambulatory Visit (INDEPENDENT_AMBULATORY_CARE_PROVIDER_SITE_OTHER): Payer: Medicare Other | Admitting: Nurse Practitioner

## 2022-08-24 VITALS — BP 157/78 | HR 67 | Temp 97.9°F | Ht 64.0 in | Wt 155.0 lb

## 2022-08-24 DIAGNOSIS — E876 Hypokalemia: Secondary | ICD-10-CM

## 2022-08-24 DIAGNOSIS — I10 Essential (primary) hypertension: Secondary | ICD-10-CM | POA: Diagnosis not present

## 2022-08-24 DIAGNOSIS — W19XXXA Unspecified fall, initial encounter: Secondary | ICD-10-CM | POA: Diagnosis not present

## 2022-08-24 NOTE — Progress Notes (Signed)
$'@Patient'M$  ID: Beth Hubbard, female    DOB: 26-Oct-1946, 76 y.o.   MRN: [id number]  Chief Complaint  Patient presents with   Follow-up    Pt is here for f/u from her hospital visit X1 week ago Pt is requesting to start the process of getting home health services     Referring provider: Teena Dunk, NP   HPI  Beth Hubbard 76 y.o. female  has a past medical history of Arthritis, Diabetes mellitus without complication ([place]), Excessive daytime sleepiness, Glaucoma, High cholesterol, Hypertension, Mitral regurgitation, OSA (obstructive sleep apnea) (10/18/2015), Pneumonia, Shingles, and TIA (transient ischemic attack). To the [facility] for reevaluation of HTN and insomnia.    Patient presents today for follow-up visit.  She states that she has fallen twice over the past 6 months.  She is requesting home health for falls.  We will place a referral for this today.  Patient does need a refill on amlodipine today.  Will check blood work today. Denies f/c/s, n/v/d, hemoptysis, PND, leg swelling Denies chest pain or edema       Allergies  Allergen Reactions   Aspirin     Tremor    Aspirin Other (See Comments)    Ringing in Ear   Ibuprofen Other (See Comments)    Ringing in Ear   Penicillins Itching and Other (See Comments)    Reaction: itching,headache, dizziness and anxiety. Feels like she is "goin off" Did it involve swelling of the face/tongue/throat, SOB, or low BP? No Did it involve sudden or severe rash/hives, skin peeling, or any reaction on the inside of your mouth or nose? No Did you need to seek medical attention at a hospital or doctor's office? No When did it last happen?      Childhood If all above answers are "NO", may proceed with cephalosporin use.   Penicillins Other (See Comments)    Patient fainted   Nsaids Anxiety    Reaction: causes dizziness and headache. Ibuprofen. Aspirin    Immunization History  Administered Date(s) Administered   Influenza, High Dose  Seasonal PF 09/23/2018   Influenza,inj,Quad PF,6+ Mos 09/23/2020   Influenza-Unspecified 06/19/2013   PFIZER(Purple Top)SARS-COV-2 Vaccination 04/28/2020, 05/27/2020   Pneumococcal Conjugate-13 02/24/2015   Pneumococcal Polysaccharide-23 03/05/2016   Tdap 02/24/2015    Past Medical History:  Diagnosis Date   Arthritis    Diabetes mellitus without complication (HCC)    Excessive daytime sleepiness    Glaucoma    Heart attack ([place])    High cholesterol    Hyperlipemia    Hypertension    Mitral regurgitation    mild by echo 03/2017   OSA (obstructive sleep apnea) 10/18/2015   Mild OSA with AHI 12.5/hr on CPAP at 8cm H2O   Pneumonia    Shingles    TIA (transient ischemic attack)     Tobacco History: Social History   Tobacco Use  Smoking Status Never  Smokeless Tobacco Never   Counseling given: Not Answered   Outpatient Encounter Medications as of 08/24/2022  Medication Sig   ACCU-CHEK AVIVA PLUS test strip Reported on 02/24/2016   Accu-Chek Softclix Lancets lancets Check BS once or twice a day.   acetaminophen (TYLENOL) 500 MG tablet Take 1,000 mg by mouth every 6 (six) hours as needed for mild pain.   amLODipine (NORVASC) 10 MG tablet Take 1 tablet (10 mg total) by mouth daily.   Blood Pressure Monitor KIT 1 kit by Does not apply route daily.   brimonidine-timolol (COMBIGAN)  0.2-0.5 % ophthalmic solution Place 1 drop into both eyes 2 (two) times daily.   cycloSPORINE (RESTASIS) 0.05 % ophthalmic emulsion Place 1 drop into both eyes 2 (two) times daily.   dorzolamide (TRUSOPT) 2 % ophthalmic solution Place 1 drop into both eyes 2 (two) times daily.   ferrous sulfate 325 (65 FE) MG tablet Take 325 mg by mouth daily.    gabapentin (NEURONTIN) 300 MG capsule TAKE 1 CAPSULE BY MOUTH EVERY NIGHT AT BEDTIME   ketotifen (ZADITOR) 0.025 % ophthalmic solution Place 1 drop into both eyes 2 (two) times daily as needed (itching).   latanoprost (XALATAN) 0.005 % ophthalmic solution 1  drop at bedtime.   Multiple Vitamins-Minerals (MULTIVITAMIN WITH MINERALS) tablet Take 1 tablet by mouth daily.   Omega-3 Fatty Acids (FISH OIL) 1000 MG CAPS Take 1,000 mg by mouth daily.   ondansetron (ZOFRAN-ODT) 4 MG disintegrating tablet Take 1 tablet (4 mg total) by mouth every 8 (eight) hours as needed for nausea or vomiting. (Patient not taking: Reported on 09/25/2022)   [DISCONTINUED] acetaminophen (TYLENOL) 500 MG tablet Take 1,000 mg by mouth every 6 (six) hours as needed for moderate pain or headache.   [DISCONTINUED] amLODipine (NORVASC) 5 MG tablet TAKE 2 TABLETS(10 MG) BY MOUTH DAILY   [DISCONTINUED] Blood Glucose Monitoring Suppl (ACCU-CHEK AVIVA PLUS) w/Device KIT 1 kit by Does not apply route as directed. Check 1 to 2 times daily   [DISCONTINUED] brimonidine-timolol (COMBIGAN) 0.2-0.5 % ophthalmic solution Place 1 drop into both eyes daily.   [DISCONTINUED] diphenhydrAMINE (BENADRYL) 25 MG tablet Take 25 mg by mouth at bedtime as needed for sleep.   [DISCONTINUED] JANUVIA 50 MG tablet Take 50 mg by mouth daily.   [DISCONTINUED] Melatonin 10 MG TABS Take 10 mg by mouth at bedtime.   [DISCONTINUED] oxymetazoline (AFRIN) 0.05 % nasal spray Place 1 spray into both nostrils 2 (two) times daily as needed for congestion.   [DISCONTINUED] rosuvastatin (CRESTOR) 20 MG tablet TAKE 1 TABLET(20 MG) BY MOUTH DAILY   [DISCONTINUED] rosuvastatin (CRESTOR) 20 MG tablet Take 20 mg by mouth at bedtime.   [DISCONTINUED] sitaGLIPtin (JANUVIA) 50 MG tablet TAKE 1 TABLET(50 MG) BY MOUTH DAILY   [DISCONTINUED] lisinopril (ZESTRIL) 20 MG tablet Take 1 tablet (20 mg total) by mouth daily. (Patient not taking: Reported on 08/24/2022)   No facility-administered encounter medications on file as of 08/24/2022.     Review of Systems  Review of Systems  Constitutional: Negative.   HENT: Negative.    Cardiovascular: Negative.   Gastrointestinal: Negative.   Allergic/Immunologic: Negative.   Neurological:  Negative.   Psychiatric/Behavioral: Negative.         Physical Exam  BP (!) 157/78 (BP Location: Left Arm, Patient Position: Sitting, Cuff Size: Normal)   Pulse 67   Temp 97.9 F (36.6 C)   Ht 5' 4"  (1.626 m)   Wt 155 lb (70.3 kg)   SpO2 98%   BMI 26.61 kg/m   Wt Readings from Last 5 Encounters:  11/05/22 155 lb (70.3 kg)  08/24/22 155 lb (70.3 kg)  08/20/22 152 lb 9.6 oz (69.2 kg)  07/27/22 153 lb (69.4 kg)  04/17/22 151 lb 3.2 oz (68.6 kg)     Physical Exam Vitals and nursing note reviewed.  Constitutional:      General: She is not in acute distress.    Appearance: She is well-developed.  Cardiovascular:     Rate and Rhythm: Normal rate and regular rhythm.  Pulmonary:  Effort: Pulmonary effort is normal.     Breath sounds: Normal breath sounds.  Neurological:     Mental Status: She is alert and oriented to person, place, and time.      Lab Results:  CBC    Component Value Date/Time   WBC 9.4 08/19/2022 0306   RBC 3.93 08/19/2022 0306   HGB 12.8 08/19/2022 0306   HGB 12.6 04/17/2022 1129   HCT 38.9 08/19/2022 0306   HCT 38.1 04/17/2022 1129   PLT 211 08/19/2022 0306   PLT 280 04/17/2022 1129   MCV 99.0 08/19/2022 0306   MCV 96 04/17/2022 1129   MCH 32.6 08/19/2022 0306   MCHC 32.9 08/19/2022 0306   RDW 13.2 08/19/2022 0306   RDW 13.1 04/17/2022 1129   LYMPHSABS 2.8 04/17/2022 1129   MONOABS 0.5 03/03/2022 2235   EOSABS 0.1 04/17/2022 1129   BASOSABS 0.0 04/17/2022 1129    BMET    Component Value Date/Time   NA 141 08/24/2022 1418   K 4.1 08/24/2022 1418   CL 102 08/24/2022 1418   CO2 24 08/24/2022 1418   GLUCOSE 87 08/24/2022 1418   GLUCOSE 88 08/20/2022 0043   BUN 14 08/24/2022 1418   CREATININE 0.98 08/24/2022 1418   CREATININE 0.70 10/02/2017 1121   CALCIUM 9.6 08/24/2022 1418   GFRNONAA >60 08/20/2022 0043   GFRNONAA 87 10/02/2017 1121   GFRAA 79 09/30/2020 1026   GFRAA 100 10/02/2017 1121    BNP No results found for:  "BNP"  ProBNP    Component Value Date/Time   PROBNP 14.0 10/21/2014 0944    Imaging: No results found.   Assessment & Plan:   Falls, initial encounter - Ambulatory referral to [facility]  2. Hypokalemia  - Basic Metabolic Panel  3. Hypertension, unspecified type  Continue amlodipine    Follow up:  Follow up in 3 months or sooner if needed     Fenton Foy, NP 11/30/2022

## 2022-08-24 NOTE — Patient Instructions (Addendum)
1. Falls, initial encounter  - Ambulatory referral to [facility]  2. Hypokalemia  - Basic Metabolic Panel  3. Hypertension, unspecified type  Continue amlodipine    Follow up:  Follow up in 3 months or sooner if needed

## 2022-08-25 LAB — BASIC METABOLIC PANEL
BUN/Creatinine Ratio: 14 (ref 12–28)
BUN: 14 mg/dL (ref 8–27)
CO2: 24 mmol/L (ref 20–29)
Calcium: 9.6 mg/dL (ref 8.7–10.3)
Chloride: 102 mmol/L (ref 96–106)
Creatinine, Ser: 0.98 mg/dL (ref 0.57–1.00)
Glucose: 87 mg/dL (ref 70–99)
Potassium: 4.1 mmol/L (ref 3.5–5.2)
Sodium: 141 mmol/L (ref 134–144)
eGFR: 60 mL/min/{1.73_m2} (ref >59)

## 2022-08-28 ENCOUNTER — Telehealth: Payer: Self-pay | Admitting: Nurse Practitioner

## 2022-08-28 NOTE — Telephone Encounter (Signed)
Patient LVM on main line stating she has missed 2 phone calls from our number. Please return her call.  [phone]

## 2022-08-29 ENCOUNTER — Telehealth: Payer: Self-pay

## 2022-08-29 NOTE — Telephone Encounter (Signed)
Message sent to provider 

## 2022-08-29 NOTE — Telephone Encounter (Signed)
She does not need either because she is 76 years old. Thanks.

## 2022-08-29 NOTE — Telephone Encounter (Signed)
Noted, patient aware

## 2022-09-04 ENCOUNTER — Other Ambulatory Visit: Payer: Self-pay | Admitting: Nurse Practitioner

## 2022-09-04 DIAGNOSIS — I10 Essential (primary) hypertension: Secondary | ICD-10-CM

## 2022-09-05 ENCOUNTER — Other Ambulatory Visit: Payer: Self-pay

## 2022-09-05 DIAGNOSIS — Z1231 Encounter for screening mammogram for malignant neoplasm of breast: Secondary | ICD-10-CM

## 2022-09-11 ENCOUNTER — Telehealth: Payer: Self-pay

## 2022-09-11 ENCOUNTER — Other Ambulatory Visit: Payer: Self-pay | Admitting: Nurse Practitioner

## 2022-09-11 NOTE — Telephone Encounter (Signed)
BP med refill

## 2022-09-12 ENCOUNTER — Other Ambulatory Visit: Payer: Self-pay | Admitting: Nurse Practitioner

## 2022-09-12 NOTE — Telephone Encounter (Signed)
Pt is needing refill on BP medications

## 2022-09-19 ENCOUNTER — Other Ambulatory Visit: Payer: Self-pay

## 2022-09-19 DIAGNOSIS — I10 Essential (primary) hypertension: Secondary | ICD-10-CM

## 2022-09-19 MED ORDER — AMLODIPINE BESYLATE 5 MG PO TABS: ORAL_TABLET | ORAL | 0 refills | Status: DC

## 2022-09-23 ENCOUNTER — Other Ambulatory Visit: Payer: Self-pay | Admitting: Nurse Practitioner

## 2022-09-23 DIAGNOSIS — E785 Hyperlipidemia, unspecified: Secondary | ICD-10-CM

## 2022-09-25 ENCOUNTER — Other Ambulatory Visit: Payer: Medicare Other | Admitting: Pharmacist

## 2022-09-25 DIAGNOSIS — E1129 Type 2 diabetes mellitus with other diabetic kidney complication: Secondary | ICD-10-CM

## 2022-09-25 MED ORDER — BLOOD GLUCOSE MONITOR KIT: PACK | 11 refills | Status: DC

## 2022-09-25 NOTE — Progress Notes (Signed)
Care Coordination Call  Patient noted to have elevated blood pressure >140/90 at last office visit. Contacted patient, spoke with her daughter, Parke Simmers (on [place]). Parke Simmers notes that immediately after hospitalization, she was managing her mother's medications, but her mother is now back to living at home by herself and managing her own medications. She was aware of medications and I was able to perform a medication reconciliation.   Current antihypertensives: amlodipine 10 mg daily (using two 5 mg tablets, taking twice daily) Previous medications: reports lisinopril was stopped in the ED. Patient had several duplicates on her medication list and one lisinopril was discontinued but the other was not.   Reviewed documentation, removed lisinopril from list.   Parke Simmers reports patient has a home BP machine but has not been checking lately.    Plan: - Encouraged Parke Simmers to talk to patient about checking home blood pressures twice daily for the next week, write down readings, and have available for review at our phone call next week.  - Requests new order for glucometer. Order placed under systemwide standing order.   Catie Hedwig Morton, PharmD, [facility] [phone]

## 2022-09-26 ENCOUNTER — Other Ambulatory Visit: Payer: Self-pay | Admitting: Nurse Practitioner

## 2022-09-26 DIAGNOSIS — E1129 Type 2 diabetes mellitus with other diabetic kidney complication: Secondary | ICD-10-CM

## 2022-10-02 NOTE — Telephone Encounter (Signed)
Januvia

## 2022-10-03 ENCOUNTER — Other Ambulatory Visit: Payer: Medicare Other | Admitting: Pharmacist

## 2022-10-03 NOTE — Progress Notes (Signed)
I have reviewed and agree with the information documented by the resident.   Beth Hubbard, PharmD, [facility] [phone]

## 2022-10-03 NOTE — Chronic Care Management (AMB) (Signed)
Care Coordination Call   Patient noted to have elevated blood pressure >140/90 at last office visit. Initial clinical pharmacist outreach occurred on 09/25/2022. Patient's daughter, Parke Simmers, was managing her mother's medications immediately after the patient's hospitalization. However, her mother is now back to living at home by herself and managing her own medications.   Today, I spoke on a 3 way call with Parke Simmers and Ms. Reynolds. She provides me a few BP readings over the last week (see below). Parke Simmers endorses that her mother's BP has been running high for the past 2 years. Ms. Nistler provides BP readings that were taken BEFORE she takes her amlodipine. Parke Simmers also notes that she has been weaned off sleeping aids as this caused falls in the past. As a result, Ms. Armstrong has had trouble sleeping.    Current antihypertensives: amlodipine 10 mg daily (using two 5 mg tablets, taking twice daily) Previous medications: reports lisinopril was stopped in the ED. Patient had several duplicates on her medication list and one lisinopril was discontinued but the other was not.    Parke Simmers reports patient has a home BP machine but has not been checking lately.   Patient reported BP readings: Nov. 8th: 155/79, 152/77 Nov 9th: 147/74, 146/76 Nov 10th: 143/72   Plan: - BP readings are close to goal even before patient takes amlodipine. Per patient's daughter, patient has a history of several falls. 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 She is now off oxycodone/APAP but continues to use trazodone.  - Instructed patient to measure her blood pressure 30 minutes to 1 hour AFTER she takes amlodipine.  - Follow up in 1 month to assess BP control.   Joseph Art, Pharm.D. PGY-2 Ambulatory Care Pharmacy Resident 10/03/2022 10:38 AM

## 2022-10-31 ENCOUNTER — Other Ambulatory Visit: Payer: Medicare Other | Admitting: Pharmacist

## 2022-10-31 ENCOUNTER — Telehealth: Payer: Self-pay

## 2022-10-31 DIAGNOSIS — I10 Essential (primary) hypertension: Secondary | ICD-10-CM

## 2022-10-31 MED ORDER — VALSARTAN 40 MG PO TABS: 40.0000 mg | ORAL_TABLET | Freq: Every day | ORAL | 3 refills | Status: DC

## 2022-10-31 NOTE — Telephone Encounter (Signed)
Pt just left a message about her BP elevated and needs to know if she has to change her med  Caller & Relationship to patient:  MRN #  [id number]   Call Back Number:   Date of Last Office Visit: 09/26/2022     Date of Next Office Visit: 11/30/2022    Medication(s) to be Refilled:   Preferred Pharmacy:   ** Please notify patient to allow 48-72 hours to process** **Let patient know to contact pharmacy at the end of the day to make sure medication is ready. ** **If patient has not been seen in a year or longer, book an appointment **Advise to use MyChart for refill requests OR to contact their pharmacy

## 2022-10-31 NOTE — Chronic Care Management (AMB) (Signed)
10/31/2022 Name: Beth Hubbard MRN: [id number] DOB: 06/22/1946  Chief Complaint  Patient presents with   Medication Management    Hypertension    Beth Hubbard is a 76 y.o. year old female who presented for a telephone visit.   They were referred to the pharmacist by their PCP for assistance in managing hypertension.   Subjective:  Care Team: Primary Care Provider: Lazaro Arms, NP ; Next Scheduled Visit: 11/30/2022  Medication Access/Adherence  Current Pharmacy:  [facility], [place] [address] [place] [address] Phone: [phone] Fax: [phone]  [id number] - [place], [place] - [address] [place] [address] Phone: [phone] Fax: [phone]  [facility] [address] [place] [address] Phone: [phone] Fax: [phone]  [id number] [place] [address] [place]: [phone]: [phone]  Patient reports affordability concerns with their medications: No  Patient reports access/transportation concerns to their pharmacy: No  Patient reports adherence concerns with their medications:  No     Hypertension:  Current medications: amlodipine 10 mg daily Medications previously tried: reports lisinopril was stopped in the ED. Patient had several duplicates on her medication list and one lisinopril was discontinued but the other was not.   Patient has a validated, automated, upper arm home BP cuff Current blood pressure readings readings: 150s/80s  Patient denies hypotensive s/sx including dizziness, lightheadedness.  Patient denies hypertensive symptoms including chest pain and shortness of breath; but endorses she had a headache last night.  Health Maintenance  Health Maintenance Due  Topic Date Due    Zoster Vaccines- Shingrix (1 of 2) Never done   COVID-19 Vaccine (3 - Pfizer risk series) 06/24/2020   Diabetic kidney evaluation - Urine ACR  06/24/2021   HEMOGLOBIN A1C  07/29/2022   Medicare Annual Wellness (AWV)  08/15/2022   FOOT EXAM  09/22/2022     Objective: Lab Results  Component Value Date   HGBA1C 6.6 (A) 01/26/2022   HGBA1C 6.6 01/26/2022   HGBA1C 6.6 (A) 01/26/2022   HGBA1C 6.6 01/26/2022    Lab Results  Component Value Date   CREATININE 0.98 08/24/2022   BUN 14 08/24/2022   NA 141 08/24/2022   K 4.1 08/24/2022   CL 102 08/24/2022   CO2 24 08/24/2022    Lab Results  Component Value Date   CHOL 193 01/26/2022   HDL 61 01/26/2022   LDLCALC 103 (H) 01/26/2022   TRIG 167 (H) 01/26/2022   CHOLHDL 3.2 01/26/2022    Medications Reviewed Today     Reviewed by HOsker Mason RPH-CPP (Pharmacist) on 09/25/22 at 0[place] City[place] Status: <None>   Medication Order Taking? Sig Documenting Provider Last Dose Status Informant  ACCU-CHEK AVIVA PLUS test strip [id number] Reported on 02/24/2016 KVevelyn Francois NP Taking Active Child  Accu-Chek Softclix Lancets lancets [id number] Check BS once or twice a day. KVevelyn Francois NP Taking Active Child  acetaminophen (TYLENOL) 500 MG tablet [id number] Take 1,000 mg by mouth every 6 (six) hours as needed for mild pain. [provider] Taking Active Self  amLODipine (NORVASC) 10 MG tablet [id number] Take 1 tablet (10 mg total) by mouth daily. DEdwin Dada MD Taking Active   Blood Pressure Monitor KIT [id number]  1 kit by Does not apply route daily. Vevelyn Francois, NP  Active Child  brimonidine-timolol (COMBIGAN) 0.2-0.5 % ophthalmic solution [id number] Yes Place 1 drop into both eyes 2 (two) times daily. [provider] Taking Active Self  cycloSPORINE (RESTASIS) 0.05 % ophthalmic emulsion [id number]  Place 1 drop into both eyes 2 (two) times daily. [provider]  Active Child   dorzolamide (TRUSOPT) 2 % ophthalmic solution [id number]  Place 1 drop into both eyes 2 (two) times daily. [provider]  Active Self  ferrous sulfate 325 (65 FE) MG tablet [id number] Yes Take 325 mg by mouth daily.  [provider] Taking Active Child  gabapentin (NEURONTIN) 300 MG capsule [id number] Yes TAKE 1 CAPSULE BY MOUTH EVERY NIGHT AT BEDTIME [place], [place] I, NP Taking Active   ketotifen (ZADITOR) 0.025 % ophthalmic solution [id number]  Place 1 drop into both eyes 2 (two) times daily as needed (itching). [provider]  Active Self  latanoprost (XALATAN) 0.005 % ophthalmic solution [id number]  1 drop at bedtime. [provider]  Active Child  Multiple Vitamins-Minerals (MULTIVITAMIN WITH MINERALS) tablet [id number] Yes Take 1 tablet by mouth daily. [provider] Taking Active Child  Omega-3 Fatty Acids (FISH OIL) 1000 MG CAPS [id number] Yes Take 1,000 mg by mouth daily. [provider] Taking Active Child  ondansetron (ZOFRAN-ODT) 4 MG disintegrating tablet [id number] No Take 1 tablet (4 mg total) by mouth every 8 (eight) hours as needed for nausea or vomiting.  Patient not taking: Reported on 09/25/2022   Fenton Foy, NP Not Taking Active Child  rosuvastatin (CRESTOR) 20 MG tablet [id number] Yes TAKE 1 TABLET(20 MG) BY MOUTH DAILY Fenton Foy, NP Taking Active   sitaGLIPtin (JANUVIA) 50 MG tablet [id number] Yes TAKE 1 TABLET(50 MG) BY MOUTH DAILY Vevelyn Francois, NP Taking Active Child           Med Note (COX, CANDICE A   Fri Jul 27, 2022  9:33 AM)    traZODone (DESYREL) 100 MG tablet [id number] No TAKE 1 TABLET(100 MG) BY MOUTH AT BEDTIME  Patient not taking: Reported on 09/25/2022   Fenton Foy, NP Not Taking Active   Med List Note Vevelyn Francois, [place] 10/17/20 0850): We will see you back at your next follow-up visit you have any questions you can call me but all your labs look okay have a happy holiday by             Assessment/Plan:   Hypertension: - Currently uncontrolled per patient report. BP remains 150s/80s while on amlodipine 10 mg daily.  - Reviewed appropriate blood pressure monitoring technique and reviewed goal blood pressure. Recommended to check home blood pressure and heart rate daily - Recommend to start valsartan 40 mg daily. Discussed with PCP and she is in agreement. BMET stable 08/24/2022 (K 4.1, SCr 0.98) - Recommend follow up BMET at next PCP visit.   Follow Up Plan:  PCP visit: 11/30/2022 Pharmacist phone call: 01/09/2023  Joseph Art, Pharm.D. PGY-2 Ambulatory Care Pharmacy Resident 10/31/2022 11:10 AM

## 2022-10-31 NOTE — Progress Notes (Signed)
Agree with documentation and plan by the resident.   Catie Hedwig Morton, PharmD, [facility] [phone]

## 2022-10-31 NOTE — Telephone Encounter (Signed)
Hey guys - [name] is communicating with [name] about this patient right now. Sounds like the patient may have gotten confused, we'll call her. She sees [place] in January.   Beth Hubbard

## 2022-11-05 ENCOUNTER — Telehealth: Payer: Self-pay | Admitting: Nurse Practitioner

## 2022-11-05 ENCOUNTER — Telehealth (INDEPENDENT_AMBULATORY_CARE_PROVIDER_SITE_OTHER): Payer: Medicare Other | Admitting: Nurse Practitioner

## 2022-11-05 ENCOUNTER — Other Ambulatory Visit (INDEPENDENT_AMBULATORY_CARE_PROVIDER_SITE_OTHER): Payer: Medicare Other

## 2022-11-05 ENCOUNTER — Encounter: Payer: Self-pay | Admitting: Nurse Practitioner

## 2022-11-05 VITALS — Ht 59.0 in | Wt 155.0 lb

## 2022-11-05 DIAGNOSIS — U071 COVID-19: Secondary | ICD-10-CM

## 2022-11-05 LAB — POCT INFLUENZA A/B
Influenza A, POC: NEGATIVE
Influenza B, POC: NEGATIVE

## 2022-11-05 LAB — POC COVID19 BINAXNOW: SARS Coronavirus 2 Ag: POSITIVE — AB

## 2022-11-05 MED ORDER — BENZONATATE 100 MG PO CAPS: 100.0000 mg | ORAL_CAPSULE | Freq: Two times a day (BID) | ORAL | 0 refills | Status: DC | PRN

## 2022-11-05 MED ORDER — NIRMATRELVIR/RITONAVIR (PAXLOVID)TABLET: 3.0000 | ORAL_TABLET | Freq: Two times a day (BID) | ORAL | 0 refills | Status: AC

## 2022-11-05 NOTE — Progress Notes (Signed)
Virtual Visit via Telephone Note  I connected with Beth Hubbard @ on 11/05/22 at 57 by telephone and verified that I am speaking with the correct person using two identifiers.I spoke with her daughter  [name], because the patient was not with her.  I spent 8 minutes on this telehealth encounter.   Location: Patient: home Provider: office   I discussed the limitations, risks, security and privacy concerns of performing an evaluation and management service by telephone and the availability of in person appointments. I also discussed with the patient that there may be a patient responsible charge related to this service. The patient expressed understanding and agreed to proceed.   History of Present Illness: [name] with past medical history of type 2 diabetes, hyperlipidemia, hypertension, mitral regurgitation presents with complaints of nonproductive cough, congestion, body aches . patient's [name] stated that the patient developed Congestion, non productive cough generalized body pain, 3 days ago. She denies  fever chills, wheezing, sob, bloody sputum. Last COVID vaccine and Flu vaccines was received a month ago. No known COVID and  flu Contact.   Assessment and Plan: COVID-19 virus infection Positive for covid 19 infection.  1. COVID-19 virus infection  - nirmatrelvir/ritonavir EUA (PAXLOVID) 20 x 150 MG & 10 x '100MG'$  TABS; Take 3 tablets by mouth 2 (two) times daily for 5 days. (Take nirmatrelvir 150 mg two tablets twice daily for 5 days and ritonavir 100 mg one tablet twice daily for 5 days) Patient GFR is 60  Dispense: 30 tablet; Refill: 0 - benzonatate (TESSALON) 100 MG capsule; Take 1 capsule (100 mg total) by mouth 2 (two) times daily as needed for cough.  Dispense: 20 capsule; Refill: 0  Every 6 hours as needed for fever, body aches Proper hand hygiene was discussed She was encouraged to drink at least 64 ounces of water daily to maintain hydration Need to stay on  isolation and wear mask for 5 days was discussed.    Follow Up Instructions:    I discussed the assessment and treatment plan with the patient. The patient was provided an opportunity to ask questions and all were answered. The patient agreed with the plan and demonstrated an understanding of the instructions.   The patient was advised to call back or seek an in-person evaluation if the symptoms worsen or if the condition fails to improve as anticipated.

## 2022-11-05 NOTE — Assessment & Plan Note (Signed)
Positive for covid 19 infection.  1. COVID-19 virus infection  - nirmatrelvir/ritonavir EUA (PAXLOVID) 20 x 150 MG & 10 x '100MG'$  TABS; Take 3 tablets by mouth 2 (two) times daily for 5 days. (Take nirmatrelvir 150 mg two tablets twice daily for 5 days and ritonavir 100 mg one tablet twice daily for 5 days) Patient GFR is 60  Dispense: 30 tablet; Refill: 0 - benzonatate (TESSALON) 100 MG capsule; Take 1 capsule (100 mg total) by mouth 2 (two) times daily as needed for cough.  Dispense: 20 capsule; Refill: 0  Every 6 hours as needed for fever, body aches Proper hand hygiene was discussed She was encouraged to drink at least 64 ounces of water daily to maintain hydration Need to stay on isolation and wear mask for 5 days was discussed.

## 2022-11-05 NOTE — Telephone Encounter (Signed)
Patient called answer service concerned that she might have pneumonia. Will try to schedule an appointment this am for evaluation.

## 2022-11-30 ENCOUNTER — Encounter: Payer: Self-pay | Admitting: Nurse Practitioner

## 2022-11-30 ENCOUNTER — Ambulatory Visit (INDEPENDENT_AMBULATORY_CARE_PROVIDER_SITE_OTHER): Payer: Medicare Other | Admitting: Nurse Practitioner

## 2022-11-30 VITALS — BP 137/68 | HR 76 | Temp 97.3°F | Ht 60.4 in | Wt 153.8 lb

## 2022-11-30 DIAGNOSIS — Z13220 Encounter for screening for lipoid disorders: Secondary | ICD-10-CM

## 2022-11-30 DIAGNOSIS — W19XXXA Unspecified fall, initial encounter: Secondary | ICD-10-CM | POA: Insufficient documentation

## 2022-11-30 DIAGNOSIS — G4700 Insomnia, unspecified: Secondary | ICD-10-CM

## 2022-11-30 DIAGNOSIS — E1129 Type 2 diabetes mellitus with other diabetic kidney complication: Secondary | ICD-10-CM

## 2022-11-30 DIAGNOSIS — R809 Proteinuria, unspecified: Secondary | ICD-10-CM | POA: Diagnosis not present

## 2022-11-30 LAB — POCT GLYCOSYLATED HEMOGLOBIN (HGB A1C): Hemoglobin A1C: 5.8 % — AB (ref 4.0–5.6)

## 2022-11-30 MED ORDER — HYDROXYZINE HCL 10 MG PO TABS: 10.0000 mg | ORAL_TABLET | Freq: Every evening | ORAL | 0 refills | Status: DC | PRN

## 2022-11-30 NOTE — Patient Instructions (Addendum)
1. Insomnia, unspecified type  - hydrOXYzine (ATARAX) 10 MG tablet; Take 1 tablet (10 mg total) by mouth at bedtime as needed.  Dispense: 30 tablet; Refill: 0  2. Controlled type 2 diabetes mellitus with microalbuminuria, without long-term current use of insulin (HCC)  - POCT glycosylated hemoglobin (Hb A1C) - Microalbumin/Creatinine Ratio, Urine -will place referral to podiatry for diabetic foot care  Follow up:  Follow up in 3 months

## 2022-11-30 NOTE — Progress Notes (Addendum)
Subjective:    Patient ID: Beth Hubbard, female    DOB: 1946/05/19, 77 y.o.   MRN: [id number]  Beth Hubbard is a 77 y.o. female who presents for follow-up of Type 2 diabetes mellitus.  Patient is not checking home blood sugars.   Home blood sugar records:  How often is blood sugars being checked: not checking Current symptoms/problems include none and have been stable. Last eye exam: march 2023 Exercise: The patient does not participate in regular exercise at present.  Denies f/c/s, n/v/d, hemoptysis, PND, leg swelling Denies chest pain or edema   The following portions of the patient's history were reviewed and updated as appropriate: allergies, current medications, past medical history, past social history and problem list.   Patient states that she cannot sleep at night. States that she does not sleep during the day.   Note: Patient is requesting home health services. Is having difficulty performing ADL'S. Decreased strength and history of chronic back pain. Cannot bend or reach to bath and clean.   Review of Systems  Constitutional: Negative.   HENT: Negative.    Eyes: Negative.   Respiratory: Negative.    Cardiovascular: Negative.   Gastrointestinal: Negative.   Genitourinary: Negative.   Musculoskeletal: Negative.   Skin: Negative.   Neurological: Negative.   Endo/Heme/Allergies: Negative.   Psychiatric/Behavioral: Negative.          Objective:    Physical Exam Constitutional:      General: She is not in acute distress. Cardiovascular:     Rate and Rhythm: Normal rate and regular rhythm.  Pulmonary:     Effort: Pulmonary effort is normal.     Breath sounds: Normal breath sounds.  Skin:    General: Skin is warm and dry.  Neurological:     Mental Status: She is alert and oriented to person, place, and time.  Psychiatric:        Mood and Affect: Affect normal.      Blood pressure 137/68, pulse 76, temperature (!) 97.3 F (36.3 C), height 5' 0.4"  (1.534 m), weight 153 lb 12.8 oz (69.8 kg), SpO2 96 %.  Lab Review    Latest Ref Rng & Units 11/30/2022    9:43 AM 08/24/2022    2:18 PM 08/20/2022   12:43 AM 08/19/2022    3:06 AM 08/18/2022    9:55 AM  Diabetic Labs  HbA1c 4.0 - 5.6 % 5.8       Creatinine 0.57 - 1.00 mg/dL  0.98  0.76  1.07  0.92       11/30/2022    9:49 AM 11/30/2022    9:27 AM 11/30/2022    9:23 AM 11/05/2022   10:13 AM 08/24/2022    1:37 PM  BP/Weight  Systolic BP 474 259 563  875  Diastolic BP 68 60 62  78  Wt. (Lbs)   153.8 155 155  BMI   29.64 kg/m2 31.31 kg/m2 26.61 kg/m2      11/30/2022    9:00 AM 08/15/2018    2:40 PM  Foot/eye exam completion dates  Foot Form Completion Done Done    [name]  reports that she has never smoked. She has never used smokeless tobacco. She reports that she does not drink alcohol and does not use drugs.     Assessment & Plan:    Insomnia, unspecified type - Plan: hydrOXYzine (ATARAX) 10 MG tablet  Controlled type 2 diabetes mellitus with microalbuminuria, without long-term current use of insulin (HCC) -  Plan: POCT glycosylated hemoglobin (Hb A1C), Microalbumin/Creatinine Ratio, Urine, CBC, Comprehensive metabolic panel  Lipid screening - Plan: Lipid Panel  Rx changes: none Education: Reviewed 'ABCs' of diabetes management (respective goals in parentheses):  A1C (<7), blood pressure (<130/80), and cholesterol (LDL <100). Compliance at present is estimated to be excellent. Efforts to improve compliance (if necessary) will be directed at  low carb diet . Follow up: 3 months   Lazaro Arms, FNP-C 11/30/22

## 2022-11-30 NOTE — Assessment & Plan Note (Signed)
-  Ambulatory referral to Home Health  2. Hypokalemia  - Basic Metabolic Panel  3. Hypertension, unspecified type  Continue amlodipine    Follow up:  Follow up in 3 months or sooner if needed

## 2022-12-01 LAB — CBC
Hematocrit: 36.6 % (ref 34.0–46.6)
Hemoglobin: 11.9 g/dL (ref 11.1–15.9)
MCH: 31.7 pg (ref 26.6–33.0)
MCHC: 32.5 g/dL (ref 31.5–35.7)
MCV: 98 fL — ABNORMAL HIGH (ref 79–97)
Platelets: 189 10*3/uL (ref 150–450)
RBC: 3.75 x10E6/uL — ABNORMAL LOW (ref 3.77–5.28)
RDW: 13.2 % (ref 11.7–15.4)
WBC: 5.5 10*3/uL (ref 3.4–10.8)

## 2022-12-01 LAB — COMPREHENSIVE METABOLIC PANEL
ALT: 16 IU/L (ref 0–32)
AST: 21 IU/L (ref 0–40)
Albumin/Globulin Ratio: 1.6 (ref 1.2–2.2)
Albumin: 4.4 g/dL (ref 3.8–4.8)
Alkaline Phosphatase: 53 IU/L (ref 44–121)
BUN/Creatinine Ratio: 14 (ref 12–28)
BUN: 14 mg/dL (ref 8–27)
Bilirubin Total: 0.3 mg/dL (ref 0.0–1.2)
CO2: 25 mmol/L (ref 20–29)
Calcium: 9.5 mg/dL (ref 8.7–10.3)
Chloride: 103 mmol/L (ref 96–106)
Creatinine, Ser: 1 mg/dL (ref 0.57–1.00)
Globulin, Total: 2.8 g/dL (ref 1.5–4.5)
Glucose: 95 mg/dL (ref 70–99)
Potassium: 4.1 mmol/L (ref 3.5–5.2)
Sodium: 143 mmol/L (ref 134–144)
Total Protein: 7.2 g/dL (ref 6.0–8.5)
eGFR: 58 mL/min/{1.73_m2} — ABNORMAL LOW (ref >59)

## 2022-12-01 LAB — LIPID PANEL
Chol/HDL Ratio: 3.1 ratio (ref 0.0–4.4)
Cholesterol, Total: 189 mg/dL (ref 100–199)
HDL: 61 mg/dL (ref >39)
LDL Chol Calc (NIH): 100 mg/dL — ABNORMAL HIGH (ref 0–99)
Triglycerides: 164 mg/dL — ABNORMAL HIGH (ref 0–149)
VLDL Cholesterol Cal: 28 mg/dL (ref 5–40)

## 2022-12-02 LAB — MICROALBUMIN / CREATININE URINE RATIO
Creatinine, Urine: 132.6 mg/dL
Microalb/Creat Ratio: 3 mg/g creat (ref 0–29)
Microalbumin, Urine: 3.7 ug/mL

## 2022-12-06 DIAGNOSIS — H10812 Pingueculitis, left eye: Secondary | ICD-10-CM | POA: Diagnosis not present

## 2022-12-11 DIAGNOSIS — H10812 Pingueculitis, left eye: Secondary | ICD-10-CM | POA: Diagnosis not present

## 2022-12-14 ENCOUNTER — Ambulatory Visit (INDEPENDENT_AMBULATORY_CARE_PROVIDER_SITE_OTHER): Payer: 59 | Admitting: Podiatry

## 2022-12-14 ENCOUNTER — Encounter: Payer: Self-pay | Admitting: Podiatry

## 2022-12-14 DIAGNOSIS — E119 Type 2 diabetes mellitus without complications: Secondary | ICD-10-CM | POA: Diagnosis not present

## 2022-12-14 DIAGNOSIS — M2041 Other hammer toe(s) (acquired), right foot: Secondary | ICD-10-CM

## 2022-12-14 DIAGNOSIS — E1142 Type 2 diabetes mellitus with diabetic polyneuropathy: Secondary | ICD-10-CM

## 2022-12-14 DIAGNOSIS — M2042 Other hammer toe(s) (acquired), left foot: Secondary | ICD-10-CM | POA: Diagnosis not present

## 2022-12-14 DIAGNOSIS — E1140 Type 2 diabetes mellitus with diabetic neuropathy, unspecified: Secondary | ICD-10-CM | POA: Insufficient documentation

## 2022-12-14 NOTE — Progress Notes (Signed)
This patient presents to the office for diabetic foot exam.  Patient was referred to the office by  Dr.  Nils Pyle.  This patient says there is no pain or discomfort in her feet.  No history of infection or drainage.  This patient presents to the office for foot exam due to having a history of diabetes.  Vascular  Dorsalis pedis and posterior tibial pulses are palpable  B/L.  Capillary return  WNL.  Temperature gradient is  WNL.  Skin turgor  WNL  Sensorium  Senn Weinstein monofilament wire  diminished/absent.. Diminished  tactile sensation.  Nail Exam  Patient has normal nails with no evidence of bacterial or fungal infection.  Orthopedic  Exam  Muscle tone and muscle strength  WNL.  No limitations of motion feet  B/L.  No crepitus or joint effusion noted.  Foot type is unremarkable and digits show no abnormalities.  Bony prominences are unremarkable.  Skin  No open lesions.  Normal skin texture and turgor.   Diabetes with no complications  Diabetic foot exam was performed.  There is no evidence of vascular .  Diminished abent LOPS.  Told her and her son about discussing gabapentin with her doctor.  To consider diabetic shoes.  RTC  1 year.   Gardiner Barefoot DPM

## 2022-12-18 ENCOUNTER — Other Ambulatory Visit: Payer: Self-pay | Admitting: Nurse Practitioner

## 2022-12-18 DIAGNOSIS — I10 Essential (primary) hypertension: Secondary | ICD-10-CM

## 2022-12-25 ENCOUNTER — Telehealth: Payer: Self-pay | Admitting: Nurse Practitioner

## 2022-12-25 NOTE — Telephone Encounter (Signed)
Please advise KH 

## 2022-12-25 NOTE — Telephone Encounter (Signed)
Caller & Relationship to patient:  MRN #  [id number]   Call Back Number:   Date of Last Office Visit: 12/18/2022     Date of Next Office Visit: 03/01/2023    Medication(s) to be Refilled: Gabapentin  Preferred Pharmacy:   ** Please notify patient to allow 48-72 hours to process** **Let patient know to contact pharmacy at the end of the day to make sure medication is ready. ** **If patient has not been seen in a year or longer, book an appointment **Advise to use MyChart for refill requests OR to contact their pharmacy

## 2022-12-26 ENCOUNTER — Other Ambulatory Visit: Payer: Self-pay | Admitting: Orthopedic Surgery

## 2022-12-26 ENCOUNTER — Other Ambulatory Visit: Payer: Self-pay | Admitting: Nurse Practitioner

## 2022-12-26 DIAGNOSIS — M542 Cervicalgia: Secondary | ICD-10-CM

## 2022-12-26 MED ORDER — GABAPENTIN 300 MG PO CAPS: ORAL_CAPSULE | ORAL | 3 refills | Status: DC

## 2022-12-28 ENCOUNTER — Ambulatory Visit (INDEPENDENT_AMBULATORY_CARE_PROVIDER_SITE_OTHER): Payer: 59

## 2022-12-28 DIAGNOSIS — E1142 Type 2 diabetes mellitus with diabetic polyneuropathy: Secondary | ICD-10-CM

## 2022-12-28 DIAGNOSIS — E119 Type 2 diabetes mellitus without complications: Secondary | ICD-10-CM

## 2022-12-28 DIAGNOSIS — M2042 Other hammer toe(s) (acquired), left foot: Secondary | ICD-10-CM

## 2022-12-28 DIAGNOSIS — M2041 Other hammer toe(s) (acquired), right foot: Secondary | ICD-10-CM

## 2022-12-28 NOTE — Addendum Note (Signed)
Addended by: Gardiner Barefoot on: 12/28/2022 11:46 AM   Modules accepted: Orders

## 2022-12-28 NOTE — Progress Notes (Signed)
Patient presents to the office today for diabetic shoe and insole measuring.  Patient was measured with brannock device to determine size and width for 1 pair of extra depth shoes and foam casted for 3 pair of insoles.   ABN signed.   Documentation of medical necessity will be sent to patient's treating diabetic doctor to verify and sign.   Patient's diabetic provider: TREATING PHYSICIAN IS Lazaro Arms, NP WILL SEND DOCUMENTS TO Blima Rich, MD   Shoes and insoles will be ordered at that time and patient will be notified for an appointment for fitting when they arrive.   Brannock measurement: 7 M  Patient shoe selection-   1st   Shoe choice:   [id number]  Shoe size ordered: 7.5 M

## 2023-01-03 ENCOUNTER — Encounter (HOSPITAL_BASED_OUTPATIENT_CLINIC_OR_DEPARTMENT_OTHER): Payer: Self-pay | Admitting: Orthopedic Surgery

## 2023-01-04 ENCOUNTER — Encounter (HOSPITAL_BASED_OUTPATIENT_CLINIC_OR_DEPARTMENT_OTHER): Payer: Self-pay | Admitting: Orthopedic Surgery

## 2023-01-04 ENCOUNTER — Encounter (HOSPITAL_BASED_OUTPATIENT_CLINIC_OR_DEPARTMENT_OTHER)
Admission: RE | Admit: 2023-01-04 | Discharge: 2023-01-04 | Disposition: A | Discharge status: Home / Self Care | Payer: 59 | Source: Ambulatory Visit | Attending: Orthopedic Surgery | Admitting: Orthopedic Surgery

## 2023-01-04 DIAGNOSIS — Z01812 Encounter for preprocedural laboratory examination: Secondary | ICD-10-CM | POA: Diagnosis present

## 2023-01-04 LAB — BASIC METABOLIC PANEL
Anion gap: 10 (ref 5–15)
BUN: 17 mg/dL (ref 8–23)
CO2: 27 mmol/L (ref 22–32)
Calcium: 9.3 mg/dL (ref 8.9–10.3)
Chloride: 102 mmol/L (ref 98–111)
Creatinine, Ser: 0.92 mg/dL (ref 0.44–1.00)
GFR, Estimated: >60 mL/min (ref >60)
Glucose, Bld: 105 mg/dL — ABNORMAL HIGH (ref 70–99)
Potassium: 4.7 mmol/L (ref 3.5–5.1)
Sodium: 139 mmol/L (ref 135–145)

## 2023-01-04 NOTE — Progress Notes (Signed)

## 2023-01-09 ENCOUNTER — Other Ambulatory Visit: Payer: Medicare Other

## 2023-01-09 ENCOUNTER — Other Ambulatory Visit: Payer: Self-pay | Admitting: Nurse Practitioner

## 2023-01-09 DIAGNOSIS — I10 Essential (primary) hypertension: Secondary | ICD-10-CM

## 2023-01-09 NOTE — Progress Notes (Addendum)
01/09/2023 Name: Beth Hubbard MRN: [id number] DOB: March 10, 1946  Chief Complaint  Patient presents with   Hypertension    Beth Hubbard is a 77 y.o. year old female who presented for a telephone visit.   They were referred to the pharmacist by their PCP for assistance in managing hypertension.   Subjective:  Care Team: Primary Care Provider: Fenton Foy, NP ; Next Scheduled Visit: 03/01/2023  Medication Access/Adherence  Current Pharmacy:  [facility], [place] [address] [place] [address] Phone: [phone] Fax: [phone]  [id number] - [place], [place] - [place] [address] [place] [address] Phone: [phone] Fax: [phone]  [facility] [address] [place] [address] Phone: [phone] Fax: [phone]  [id number] [place] [address] [place]Phone: [phone]: [phone]  Patient reports affordability concerns with their medications: No  Patient reports access/transportation concerns to their pharmacy: No  Patient reports adherence concerns with their medications:  No    Hypertension:  Current medications: amlodipine 10 mg daily, valsartan 40 mg daily Medications previously tried: lisinopril (DC'd in the ED)  Patient has a validated, automated, upper arm home BP cuff Current blood pressure readings readings: daughter is not able to give me exact readings but endorses her mother has not been complaining about he BP recently.   Patient denies hypotensive s/sx including dizziness, lightheadedness.  Patient denies hypertensive symptoms including headache, chest pain, shortness of breath   Objective:  Lab Results  Component Value Date   HGBA1C 5.8 (A) 11/30/2022    Lab Results  Component Value Date    CREATININE 0.92 01/04/2023   BUN 17 01/04/2023   NA 139 01/04/2023   K 4.7 01/04/2023   CL 102 01/04/2023   CO2 27 01/04/2023    Lab Results  Component Value Date   CHOL 189 11/30/2022   HDL 61 11/30/2022   LDLCALC 100 (H) 11/30/2022   TRIG 164 (H) 11/30/2022   CHOLHDL 3.1 11/30/2022    Medications Reviewed Today     Reviewed by SPauletta Browns [facility](Ph[facility]ist) on 01/09/23 at [phone] Med List Status: <None>   Medication Order Taking? Sig Documenting Provider Last Dose Status Informant  ACCU-CHEK AVIVA PLUS test strip [id number] Reported on 02/24/2016 KVevelyn Francois NP  Active Child  Accu-Chek Softclix Lancets lancets [id number] Check BS once or twice a day. KVevelyn Francois NP  Active Child  amLODipine (NORVASC) 10 MG tablet [id number] Take 1 tablet (10 mg total) by mouth daily. DEdwin Dada MD Taking Active   blood glucose meter kit and supplies KIT [id number] Dispense based on patient and insurance preference. Use up to four times daily as directed. NFenton Foy NP  Active   Blood Pressure Monitor KIT [id number] 1 kit by Does not apply route daily. KVevelyn Francois NP  Active Child  brimonidine-timolol (COMBIGAN) 0.2-0.5 % ophthalmic solution [id number] Place 1 drop into both eyes 2 (two) times daily. [provider]  Active Self  cycloSPORINE (RESTASIS) 0.05 % ophthalmic emulsion [id number] Place 1 drop into both eyes 2 (two) times daily.  Patient not taking: Reported on 11/30/2022   [provider]  Active Child  dorzolamide (TRUSOPT) 2 % ophthalmic solution [id number]  Place 1 drop into both eyes 2 (two) times daily. [provider]  Active Self  gabapentin (NEURONTIN) 300 MG capsule [id number]  TAKE 1 CAPSULE BY MOUTH EVERY NIGHT AT BEDTIME Fenton Foy, NP  Active   ketotifen (ZADITOR) 0.025 % ophthalmic solution [id number]  Place 1 drop into both eyes 2 (two) times daily as needed (itching). [provider]  Active Self            Med Note Mallie Mussel, [facility]   Mon Nov 05, 2022 10:12 AM) Prn   latanoprost (XALATAN) 0.005 % ophthalmic solution [id number]  1 drop at bedtime. [provider]  Active Child  meloxicam (MOBIC) 15 MG tablet [id number]  Take 15 mg by mouth daily. [provider]  Active   Omega-3 Fatty Acids (FISH OIL) 1000 MG CAPS [id number]  Take 1,000 mg by mouth daily. [provider]  Active Child  prednisoLONE acetate (PRED FORTE) 1 % ophthalmic suspension [id number]  Place 1 drop into the left eye 4 (four) times daily. [provider]  Active   rosuvastatin (CRESTOR) 20 MG tablet [id number]  TAKE 1 TABLET(20 MG) BY MOUTH DAILY Fenton Foy, NP  Active   sitaGLIPtin (JANUVIA) 50 MG tablet [id number]  TAKE 1 TABLET(50 MG) BY MOUTH DAILY Fenton Foy, NP  Active   valsartan (DIOVAN) 40 MG tablet [id number] Yes Take 1 tablet (40 mg total) by mouth daily. Fenton Foy, NP Taking Active   Med List Note Edison Pace, [place], [place] 10/17/20 [phone]): We will see you back at your next follow-up visit you have any questions you can call me but all your labs look okay have a happy holiday by             Assessment/Plan:   Hypertension: - Currently controlled per last office visit BP (137/68 mmHg on 11/30/2022). Daughter is not able to provide me exact readings today, but does state that her mother has not been complaining about her BP.  - Reviewed long term cardiovascular and renal outcomes of uncontrolled blood pressure - Reviewed appropriate blood pressure monitoring technique and reviewed goal blood pressure. Recommended to check home blood pressure and heart rate daily - Recommend to to continue current treatment for now. Patient has upcoming rotator cuff repair on 01/11/2023 which should help alleviate some pain. Could consider increasing valsartan at follow up if home BP readings not at goal, but will need to monitor potassium closely if valsartan is increased. K=4.7 on  01/04/2023.      Follow Up Plan:  Pharmacist via phone: 1 month PCP visit: April 2024  Joseph Art, Sherian Rein.D. PGY-2 Ambulatory Care Pharmacy Resident

## 2023-01-11 ENCOUNTER — Ambulatory Visit (HOSPITAL_BASED_OUTPATIENT_CLINIC_OR_DEPARTMENT_OTHER): Payer: 59 | Admitting: Anesthesiology

## 2023-01-11 ENCOUNTER — Encounter (HOSPITAL_BASED_OUTPATIENT_CLINIC_OR_DEPARTMENT_OTHER)
Admission: RE | Disposition: A | Discharge status: Home / Self Care | Payer: Self-pay | Source: Home / Self Care | Attending: Orthopedic Surgery

## 2023-01-11 ENCOUNTER — Other Ambulatory Visit: Payer: Self-pay

## 2023-01-11 ENCOUNTER — Ambulatory Visit (HOSPITAL_BASED_OUTPATIENT_CLINIC_OR_DEPARTMENT_OTHER)
Admission: RE | Admit: 2023-01-11 | Discharge: 2023-01-11 | Disposition: A | Discharge status: Home / Self Care | Payer: 59 | Attending: Orthopedic Surgery | Admitting: Orthopedic Surgery

## 2023-01-11 ENCOUNTER — Other Ambulatory Visit: Payer: Self-pay | Admitting: Nurse Practitioner

## 2023-01-11 ENCOUNTER — Telehealth: Payer: Self-pay | Admitting: Nurse Practitioner

## 2023-01-11 ENCOUNTER — Encounter (HOSPITAL_BASED_OUTPATIENT_CLINIC_OR_DEPARTMENT_OTHER): Payer: Self-pay | Admitting: Orthopedic Surgery

## 2023-01-11 DIAGNOSIS — G4733 Obstructive sleep apnea (adult) (pediatric): Secondary | ICD-10-CM | POA: Diagnosis not present

## 2023-01-11 DIAGNOSIS — Z01818 Encounter for other preprocedural examination: Secondary | ICD-10-CM

## 2023-01-11 DIAGNOSIS — S43432A Superior glenoid labrum lesion of left shoulder, initial encounter: Secondary | ICD-10-CM | POA: Diagnosis not present

## 2023-01-11 DIAGNOSIS — I340 Nonrheumatic mitral (valve) insufficiency: Secondary | ICD-10-CM | POA: Insufficient documentation

## 2023-01-11 DIAGNOSIS — M19012 Primary osteoarthritis, left shoulder: Secondary | ICD-10-CM | POA: Diagnosis present

## 2023-01-11 DIAGNOSIS — M7522 Bicipital tendinitis, left shoulder: Secondary | ICD-10-CM | POA: Diagnosis not present

## 2023-01-11 DIAGNOSIS — X58XXXA Exposure to other specified factors, initial encounter: Secondary | ICD-10-CM | POA: Insufficient documentation

## 2023-01-11 DIAGNOSIS — Z7984 Long term (current) use of oral hypoglycemic drugs: Secondary | ICD-10-CM | POA: Insufficient documentation

## 2023-01-11 DIAGNOSIS — M75102 Unspecified rotator cuff tear or rupture of left shoulder, not specified as traumatic: Secondary | ICD-10-CM | POA: Diagnosis present

## 2023-01-11 DIAGNOSIS — I10 Essential (primary) hypertension: Secondary | ICD-10-CM | POA: Insufficient documentation

## 2023-01-11 DIAGNOSIS — E119 Type 2 diabetes mellitus without complications: Secondary | ICD-10-CM | POA: Diagnosis not present

## 2023-01-11 DIAGNOSIS — M75122 Complete rotator cuff tear or rupture of left shoulder, not specified as traumatic: Secondary | ICD-10-CM

## 2023-01-11 DIAGNOSIS — S46212A Strain of muscle, fascia and tendon of other parts of biceps, left arm, initial encounter: Secondary | ICD-10-CM | POA: Diagnosis present

## 2023-01-11 HISTORY — PX: SHOULDER ARTHROSCOPY WITH OPEN ROTATOR CUFF REPAIR AND DISTAL CLAVICLE ACROMINECTOMY: SHX5683

## 2023-01-11 HISTORY — PX: BICEPT TENODESIS: SHX5116

## 2023-01-11 LAB — GLUCOSE, CAPILLARY
Glucose-Capillary: 85 mg/dL (ref 70–99)
Glucose-Capillary: 134 mg/dL — ABNORMAL HIGH (ref 70–99)

## 2023-01-11 SURGERY — SHOULDER ARTHROSCOPY WITH OPEN ROTATOR CUFF REPAIR AND DISTAL CLAVICLE ACROMINECTOMY
Anesthesia: General | Site: Shoulder | Laterality: Left

## 2023-01-11 MED ORDER — VANCOMYCIN HCL IN DEXTROSE 1-5 GM/200ML-% IV SOLN
INTRAVENOUS | Status: AC
  Filled 2023-01-11: qty 200

## 2023-01-11 MED ORDER — TIZANIDINE HCL 2 MG PO TABS: 2.0000 mg | ORAL_TABLET | Freq: Two times a day (BID) | ORAL | 0 refills | Status: DC

## 2023-01-11 MED ORDER — ACETAMINOPHEN 500 MG PO TABS
1000.0000 mg | ORAL_TABLET | Freq: Once | ORAL | Status: AC
  Administered 2023-01-11: 1000 mg via ORAL

## 2023-01-11 MED ORDER — EPHEDRINE SULFATE (PRESSORS) 50 MG/ML IJ SOLN
INTRAMUSCULAR | Status: DC | PRN
  Administered 2023-01-11 (×2): 10 mg via INTRAVENOUS

## 2023-01-11 MED ORDER — PHENYLEPHRINE HCL (PRESSORS) 10 MG/ML IV SOLN
INTRAVENOUS | Status: AC
  Filled 2023-01-11: qty 1

## 2023-01-11 MED ORDER — ONDANSETRON HCL 4 MG/2ML IJ SOLN
INTRAMUSCULAR | Status: DC | PRN
  Administered 2023-01-11: 4 mg via INTRAVENOUS

## 2023-01-11 MED ORDER — BUPIVACAINE HCL (PF) 0.5 % IJ SOLN
INTRAMUSCULAR | Status: DC | PRN
  Administered 2023-01-11: 10 mL via PERINEURAL

## 2023-01-11 MED ORDER — LIDOCAINE 2% (20 MG/ML) 5 ML SYRINGE
INTRAMUSCULAR | Status: AC
  Filled 2023-01-11: qty 5

## 2023-01-11 MED ORDER — ONDANSETRON HCL 4 MG/2ML IJ SOLN: 4.0000 mg | Freq: Once | INTRAMUSCULAR | Status: DC | PRN

## 2023-01-11 MED ORDER — SODIUM CHLORIDE 0.9 % IR SOLN
Status: DC | PRN
  Administered 2023-01-11: 7000 mL

## 2023-01-11 MED ORDER — HYDROCODONE-ACETAMINOPHEN 5-325 MG PO TABS: 1.0000 | ORAL_TABLET | Freq: Four times a day (QID) | ORAL | 0 refills | Status: DC | PRN

## 2023-01-11 MED ORDER — ONDANSETRON HCL 4 MG/2ML IJ SOLN
INTRAMUSCULAR | Status: AC
  Filled 2023-01-11: qty 2

## 2023-01-11 MED ORDER — PHENYLEPHRINE HCL-NACL 20-0.9 MG/250ML-% IV SOLN
INTRAVENOUS | Status: DC | PRN
  Administered 2023-01-11: 50 ug/min via INTRAVENOUS

## 2023-01-11 MED ORDER — BUPIVACAINE LIPOSOME 1.3 % IJ SUSP
INTRAMUSCULAR | Status: DC | PRN
  Administered 2023-01-11: 10 mL via PERINEURAL

## 2023-01-11 MED ORDER — FENTANYL CITRATE (PF) 100 MCG/2ML IJ SOLN
100.0000 ug | Freq: Once | INTRAMUSCULAR | Status: AC
  Administered 2023-01-11: 50 ug via INTRAVENOUS

## 2023-01-11 MED ORDER — SUGAMMADEX SODIUM 200 MG/2ML IV SOLN
INTRAVENOUS | Status: DC | PRN
  Administered 2023-01-11: 200 mg via INTRAVENOUS

## 2023-01-11 MED ORDER — FENTANYL CITRATE (PF) 100 MCG/2ML IJ SOLN
INTRAMUSCULAR | Status: AC
  Filled 2023-01-11: qty 2

## 2023-01-11 MED ORDER — PROPOFOL 10 MG/ML IV BOLUS
INTRAVENOUS | Status: DC | PRN
  Administered 2023-01-11: 130 mg via INTRAVENOUS

## 2023-01-11 MED ORDER — LIDOCAINE HCL (CARDIAC) PF 100 MG/5ML IV SOSY
PREFILLED_SYRINGE | INTRAVENOUS | Status: DC | PRN
  Administered 2023-01-11: 60 mg via INTRAVENOUS

## 2023-01-11 MED ORDER — VANCOMYCIN HCL IN DEXTROSE 1-5 GM/200ML-% IV SOLN
1000.0000 mg | INTRAVENOUS | Status: AC
  Administered 2023-01-11: 1000 mg via INTRAVENOUS

## 2023-01-11 MED ORDER — DEXAMETHASONE SODIUM PHOSPHATE 10 MG/ML IJ SOLN
INTRAMUSCULAR | Status: AC
  Filled 2023-01-11: qty 1

## 2023-01-11 MED ORDER — MIDAZOLAM HCL 2 MG/2ML IJ SOLN
INTRAMUSCULAR | Status: AC
  Filled 2023-01-11: qty 2

## 2023-01-11 MED ORDER — DEXAMETHASONE SODIUM PHOSPHATE 4 MG/ML IJ SOLN
INTRAMUSCULAR | Status: DC | PRN
  Administered 2023-01-11: 4 mg via INTRAVENOUS

## 2023-01-11 MED ORDER — AMLODIPINE BESYLATE 10 MG PO TABS: 10.0000 mg | ORAL_TABLET | Freq: Every day | ORAL | 3 refills | Status: DC

## 2023-01-11 MED ORDER — ROCURONIUM BROMIDE 10 MG/ML (PF) SYRINGE
PREFILLED_SYRINGE | INTRAVENOUS | Status: AC
  Filled 2023-01-11: qty 10

## 2023-01-11 MED ORDER — FENTANYL CITRATE (PF) 100 MCG/2ML IJ SOLN: 25.0000 ug | INTRAMUSCULAR | Status: DC | PRN

## 2023-01-11 MED ORDER — LACTATED RINGERS IV SOLN: INTRAVENOUS | Status: DC

## 2023-01-11 MED ORDER — ACETAMINOPHEN 500 MG PO TABS
ORAL_TABLET | ORAL | Status: AC
  Filled 2023-01-11: qty 2

## 2023-01-11 MED ORDER — ROCURONIUM BROMIDE 100 MG/10ML IV SOLN
INTRAVENOUS | Status: DC | PRN
  Administered 2023-01-11: 50 mg via INTRAVENOUS

## 2023-01-11 SURGICAL SUPPLY — 78 items
AID PSTN UNV HD RSTRNT DISP (MISCELLANEOUS) ×1
ANCH SUT 2 SWLK 19.1 CLS EYLT (Anchor) ×1 IMPLANT
ANCH SUT FBRTAPE 1.3X2.6X1.7 (Anchor) ×1 IMPLANT
ANCH SUT FBRTK 1.3 2 TPE (Anchor) ×1 IMPLANT
ANCHOR FBRTK 2.6 SUTURETAP 1.3 (Anchor) IMPLANT
ANCHOR SUT FBRTK 2.6 SOFT 1.7 (Anchor) IMPLANT
ANCHOR SWIVELOCK BIO 4.75X19.1 (Anchor) IMPLANT
APL SKNCLS STERI-STRIP NONHPOA (GAUZE/BANDAGES/DRESSINGS) ×1
BENZOIN TINCTURE PRP APPL 2/3 (GAUZE/BANDAGES/DRESSINGS) IMPLANT
BLADE SURG 15 STRL LF DISP TIS (BLADE) IMPLANT
BLADE SURG 15 STRL SS (BLADE)
BURR OVAL 8 FLU 4.0X13 (MISCELLANEOUS) IMPLANT
CANNULA 5.75X71 LONG (CANNULA) IMPLANT
CANNULA TWIST IN 8.25X7CM (CANNULA) IMPLANT
DISSECTOR 4.0MM X 13CM (MISCELLANEOUS) ×2 IMPLANT
DRAPE INCISE IOBAN 66X45 STRL (DRAPES) ×2 IMPLANT
DRAPE STERI 35X30 U-POUCH (DRAPES) ×2 IMPLANT
DRAPE SURG 17X23 STRL (DRAPES) ×2 IMPLANT
DRAPE U-SHAPE 47X51 STRL (DRAPES) ×2 IMPLANT
DRAPE U-SHAPE 76X120 STRL (DRAPES) ×4 IMPLANT
DRSG EMULSION OIL 3X3 NADH (GAUZE/BANDAGES/DRESSINGS) ×2 IMPLANT
DURAPREP 26ML APPLICATOR (WOUND CARE) ×2 IMPLANT
ELECT REM PT RETURN 9FT ADLT (ELECTROSURGICAL) ×1
ELECTRODE REM PT RTRN 9FT ADLT (ELECTROSURGICAL) ×2 IMPLANT
GAUZE PAD ABD 8X10 STRL (GAUZE/BANDAGES/DRESSINGS) ×4 IMPLANT
GAUZE SPONGE 4X4 12PLY STRL (GAUZE/BANDAGES/DRESSINGS) ×2 IMPLANT
GLOVE BIO SURGEON STRL SZ 6.5 (GLOVE) IMPLANT
GLOVE BIOGEL PI IND STRL 6.5 (GLOVE) IMPLANT
GLOVE BIOGEL PI IND STRL 7.0 (GLOVE) IMPLANT
GLOVE BIOGEL PI IND STRL 8 (GLOVE) ×4 IMPLANT
GLOVE ECLIPSE 7.5 STRL STRAW (GLOVE) ×4 IMPLANT
GOWN STRL REUS W/ TWL LRG LVL3 (GOWN DISPOSABLE) ×2 IMPLANT
GOWN STRL REUS W/ TWL XL LVL3 (GOWN DISPOSABLE) ×2 IMPLANT
GOWN STRL REUS W/TWL LRG LVL3 (GOWN DISPOSABLE) ×2
GOWN STRL REUS W/TWL XL LVL3 (GOWN DISPOSABLE) ×3 IMPLANT
IV NS IRRIG 3000ML ARTHROMATIC (IV SOLUTION) ×4 IMPLANT
MANIFOLD NEPTUNE II (INSTRUMENTS) ×2 IMPLANT
NDL 1/2 CIR CATGUT .05X1.09 (NEEDLE) IMPLANT
NDL HD SCORPION MEGA LOADER (NEEDLE) IMPLANT
NDL HYPO 18GX1.5 BLUNT FILL (NEEDLE) ×2 IMPLANT
NDL SUT 6 .5 CRC .975X.05 MAYO (NEEDLE) IMPLANT
NEEDLE 1/2 CIR CATGUT .05X1.09 (NEEDLE) IMPLANT
NEEDLE HYPO 18GX1.5 BLUNT FILL (NEEDLE) ×1 IMPLANT
NEEDLE MAYO TAPER (NEEDLE)
NS IRRIG 1000ML POUR BTL (IV SOLUTION) IMPLANT
PACK ARTHROSCOPY DSU (CUSTOM PROCEDURE TRAY) ×2 IMPLANT
PACK BASIN DAY SURGERY FS (CUSTOM PROCEDURE TRAY) ×2 IMPLANT
PASSER SUT SWANSON 36MM LOOP (INSTRUMENTS) IMPLANT
PENCIL SMOKE EVACUATOR (MISCELLANEOUS) IMPLANT
PORT APPOLLO RF 90DEGREE MULTI (SURGICAL WAND) ×2 IMPLANT
RESTRAINT HEAD UNIVERSAL NS (MISCELLANEOUS) ×2 IMPLANT
SET IRRIG Y TYPE TUR BLADDER L (SET/KITS/TRAYS/PACK) ×2 IMPLANT
SLEEVE SCD COMPRESS KNEE MED (STOCKING) ×2 IMPLANT
SLING ARM FOAM STRAP LRG (SOFTGOODS) IMPLANT
SPIKE FLUID TRANSFER (MISCELLANEOUS) IMPLANT
SPONGE T-LAP 4X18 ~~LOC~~+RFID (SPONGE) IMPLANT
STRIP CLOSURE SKIN 1/2X4 (GAUZE/BANDAGES/DRESSINGS) IMPLANT
SUCTION FRAZIER HANDLE 10FR (MISCELLANEOUS)
SUCTION TUBE FRAZIER 10FR DISP (MISCELLANEOUS) IMPLANT
SUT ETHIBOND 2 OS 4 DA (SUTURE) IMPLANT
SUT ETHILON 4 0 PS 2 18 (SUTURE) IMPLANT
SUT MNCRL AB 3-0 PS2 18 (SUTURE) IMPLANT
SUT PDS AB 0 CT 36 (SUTURE) IMPLANT
SUT TICRON 1 T 12 (SUTURE) IMPLANT
SUT TIGER TAPE 7 IN WHITE (SUTURE) IMPLANT
SUT VIC AB 0 CT1 27 (SUTURE)
SUT VIC AB 0 CT1 27XBRD ANBCTR (SUTURE) IMPLANT
SUT VIC AB 1 CT1 27 (SUTURE) ×1
SUT VIC AB 1 CT1 27XBRD ANBCTR (SUTURE) IMPLANT
SUT VIC AB 2-0 SH 27 (SUTURE) ×1
SUT VIC AB 2-0 SH 27XBRD (SUTURE) IMPLANT
SYR 5ML LL (SYRINGE) ×2 IMPLANT
TAPE FIBER 2MM 7IN #2 BLUE (SUTURE) IMPLANT
TOWEL GREEN STERILE FF (TOWEL DISPOSABLE) ×2 IMPLANT
TUBE CONNECTING 20X1/4 (TUBING) IMPLANT
TUBING ARTHROSCOPY IRRIG 16FT (MISCELLANEOUS) IMPLANT
WATER STERILE IRR 1000ML POUR (IV SOLUTION) ×2 IMPLANT
YANKAUER SUCT BULB TIP NO VENT (SUCTIONS) IMPLANT

## 2023-01-11 NOTE — Transfer of Care (Signed)
Immediate Anesthesia Transfer of Care Note  Patient: Beth Hubbard  Procedure(s) Performed: ARTHROSCOPY SHOULDER MINI OPEN ROTATOR CUFF REPAIR, SUBACROMIAL DECOMPRESSION AND DISTAL CLAVICLE RESECTION (Left: Shoulder) BICEPS TENODESIS (Left: Shoulder)  Patient Location: PACU  Anesthesia Type:GA combined with regional for post-op pain  Level of Consciousness: sedated  Airway & Oxygen Therapy: Patient Spontanous Breathing and Patient connected to face mask oxygen  Post-op Assessment: Report given to RN and Post -op Vital signs reviewed and stable  Post vital signs: Reviewed and stable  Last Vitals:  Vitals Value Taken Time  BP 145/55 01/11/23 1217  Temp    Pulse 67 01/11/23 1218  Resp    SpO2 100 % 01/11/23 1218  Vitals shown include unvalidated device data.  Last Pain:  Vitals:   01/11/23 0817  TempSrc: Oral  PainSc: 0-No pain      Patients Stated Pain Goal: 4 ([id number])  Complications: No notable events documented.

## 2023-01-11 NOTE — Anesthesia Procedure Notes (Signed)
Anesthesia Regional Block: Interscalene brachial plexus block   Pre-Anesthetic Checklist: , timeout performed,  Correct Patient, Correct Site, Correct Laterality,  Correct Procedure, Correct Position, site marked,  Risks and benefits discussed,  Surgical consent,  Pre-op evaluation,  At surgeon's request and post-op pain management  Laterality: Left  Prep: chloraprep       Needles:  Injection technique: Single-shot  Needle Type: Echogenic Stimulator Needle     Needle Length: 5cm  Needle Gauge: 22     Additional Needles:   Procedures:,,,, ultrasound used (permanent image in chart),,    Narrative:  Start time: 01/11/2023 9:39 AM End time: 01/11/2023 9:46 AM Injection made incrementally with aspirations every 5 mL.  Performed by: Personally  Anesthesiologist: Santa Lighter, MD  Additional Notes: Functioning IV was confirmed and monitors were applied.  A 59m 22ga Arrow echogenic stimulator needle was used. Sterile prep and drape, hand hygiene, and sterile gloves were used.  Negative aspiration and negative test dose prior to incremental administration of local anesthetic. The patient tolerated the procedure well.  Ultrasound guidance: relevent anatomy identified, needle position confirmed, local anesthetic spread visualized around nerve(s), vascular puncture avoided.  Image printed for medical record.

## 2023-01-11 NOTE — Discharge Instructions (Addendum)
Discharge Instructions after Arthroscopic Shoulder Repair   A sling has been provided for you. Remain in your sling at all times. This includes sleeping in your sling.  Use ice on the shoulder intermittently over the first 48 hours after surgery.  Pain medicine has been prescribed for you.  Use your medicine liberally over the first 48 hours, and then you can begin to taper your use. You may take Extra Strength Tylenol or Tylenol only in place of the pain pills. DO NOT take ANY nonsteroidal anti-inflammatory pain medications: Advil, Motrin, Ibuprofen, Aleve, Naproxen, or Narprosyn.  You may remove your dressing after two days. If the incision sites are still moist, place a Band-Aid over the moist site(s). Change Band-Aids daily until dry.  You may shower 5 days after surgery. The incisions CANNOT get wet prior to 5 days. Simply allow the water to wash over the site and then pat dry. Do not rub the incisions. Make sure your axilla (armpit) is completely dry after showering.  Take one aspirin a day for 2 weeks after surgery, unless you have an aspirin sensitivity/ allergy or asthma.   Please call [phone] during normal business hours or [phone] after hours for any problems. Including the following:  - excessive redness of the incisions - drainage for more than 4 days - fever of more than 101.5 F  *Please note that pain medications will not be refilled after hours or on weekends.   Post Anesthesia Home Care Instructions  Activity: Get plenty of rest for the remainder of the day. A responsible individual must stay with you for 24 hours following the procedure.  For the next 24 hours, DO NOT: -Drive a car -Paediatric nurse -Drink alcoholic beverages -Take any medication unless instructed by your physician -Make any legal decisions or sign important papers.  Meals: Start with liquid foods such as gelatin or soup. Progress to regular foods as tolerated. Avoid greasy, spicy, heavy  foods. If nausea and/or vomiting occur, drink only clear liquids until the nausea and/or vomiting subsides. Call your physician if vomiting continues.  Special Instructions/Symptoms: Your throat may feel dry or sore from the anesthesia or the breathing tube placed in your throat during surgery. If this causes discomfort, gargle with warm salt water. The discomfort should disappear within 24 hours.  If you had a scopolamine patch placed behind your ear for the management of post- operative nausea and/or vomiting:  1. The medication in the patch is effective for 72 hours, after which it should be removed.  Wrap patch in a tissue and discard in the trash. Wash hands thoroughly with soap and water. 2. You may remove the patch earlier than 72 hours if you experience unpleasant side effects which may include dry mouth, dizziness or visual disturbances. 3. Avoid touching the patch. Wash your hands with soap and water after contact with the patch.  Regional Anesthesia Blocks  1. Numbness or the inability to move the "blocked" extremity may last from 3-48 hours after placement. The length of time depends on the medication injected and your individual response to the medication. If the numbness is not going away after 48 hours, call your surgeon.  2. The extremity that is blocked will need to be protected until the numbness is gone and the  Strength has returned. Because you cannot feel it, you will need to take extra care to avoid injury. Because it may be weak, you may have difficulty moving it or using it. You may not know what  position it is in without looking at it while the block is in effect.  3. For blocks in the legs and feet, returning to weight bearing and walking needs to be done carefully. You will need to wait until the numbness is entirely gone and the strength has returned. You should be able to move your leg and foot normally before you try and bear weight or walk. You will need someone to be  with you when you first try to ensure you do not fall and possibly risk injury.  4. Bruising and tenderness at the needle site are common side effects and will resolve in a few days.  5. Persistent numbness or new problems with movement should be communicated to the surgeon or the [place] [phone] [place] [phone]).Information for Discharge Teaching: EXPAREL (bupivacaine liposome injectable suspension)   Your surgeon or anesthesiologist gave you EXPAREL(bupivacaine) to help control your pain after surgery.  EXPAREL is a local anesthetic that provides pain relief by numbing the tissue around the surgical site. EXPAREL is designed to release pain medication over time and can control pain for up to 72 hours. Depending on how you respond to EXPAREL, you may require less pain medication during your recovery.  Possible side effects: Temporary loss of sensation or ability to move in the area where bupivacaine was injected. Nausea, vomiting, constipation Rarely, numbness and tingling in your mouth or lips, lightheadedness, or anxiety may occur. Call your doctor right away if you think you may be experiencing any of these sensations, or if you have other questions regarding possible side effects.  Follow all other discharge instructions given to you by your surgeon or nurse. Eat a healthy diet and drink plenty of water or other fluids.  If you return to the hospital for any reason within 96 hours following the administration of EXPAREL, it is important for health care providers to know that you have received this anesthetic. A teal colored band has been placed on your arm with the date, time and amount of EXPAREL you have received in order to alert and inform your health care providers. Please leave this armband in place for the full 96 hours following administration, and then you may remove the band.  No tylenol until after 2:30pm today if needed.

## 2023-01-11 NOTE — Anesthesia Preprocedure Evaluation (Addendum)
Anesthesia Evaluation  Patient identified by MRN, date of birth, ID band Patient awake    Reviewed: Allergy & Precautions, NPO status , Patient's Chart, lab work & pertinent test results  Airway Mallampati: III  TM Distance: >3 FB Neck ROM: Full    Dental  (+) Dental Advisory Given, Poor Dentition, Chipped, Missing   Pulmonary sleep apnea and Continuous Positive Airway Pressure Ventilation    Pulmonary exam normal breath sounds clear to auscultation       Cardiovascular hypertension, Pt. on medications Normal cardiovascular exam+ Valvular Problems/Murmurs MR  Rhythm:Regular Rate:Normal     Neuro/Psych TIA   GI/Hepatic negative GI ROS, Neg liver ROS,,,  Endo/Other  diabetes, Type 2, Oral Hypoglycemic Agents    Renal/GU negative Renal ROS     Musculoskeletal  (+) Arthritis ,  LEFT SHOULDER ROTATOR CUFF TENDINOPATHY   Abdominal   Peds  Hematology negative hematology ROS (+)   Anesthesia Other Findings Day of surgery medications reviewed with the patient.  Reproductive/Obstetrics                             Anesthesia Physical Anesthesia Plan  ASA: 2  Anesthesia Plan: General   Post-op Pain Management: Tylenol PO (pre-op)* and Regional block*   Induction: Intravenous  PONV Risk Score and Plan: 3 and Dexamethasone and Ondansetron  Airway Management Planned: Oral ETT  Additional Equipment:   Intra-op Plan:   Post-operative Plan: Extubation in OR  Informed Consent: I have reviewed the patients History and Physical, chart, labs and discussed the procedure including the risks, benefits and alternatives for the proposed anesthesia with the patient or authorized representative who has indicated his/her understanding and acceptance.     Dental advisory given  Plan Discussed with: CRNA  Anesthesia Plan Comments:        Anesthesia Quick Evaluation

## 2023-01-11 NOTE — Op Note (Signed)
Beth Hubbard, Beth Hubbard. MEDICAL RECORD NO: [id number] ACCOUNT NO: [id number] DATE OF BIRTH: Feb 11, 1946 FACILITY: [facility] LOCATION: [facility] PHYSICIAN: Alta Corning, MD  Operative Report   DATE OF PROCEDURE: 01/11/2023  PREOPERATIVE DIAGNOSIS: Rotator cuff tear with impending biceps rupture, impingement, and AC joint arthritis 1, 2, 3 and 4.  POSTOPERATIVE DIAGNOSIS:  Rotator cuff tear with impending biceps rupture, impingement, and AC joint arthritis 1, 2, 3 and 4 with definite impending biceps tendon rupture and superior labral tear anterior to posterior.  PROCEDURES: 1.  Mini open rotator cuff repair with a single medial anchor and a single lateral row anchor. 2.  Mini open biceps tenodesis with a single anchor in the biceps groove. 3.  Arthroscopic subacromial decompression. 4.  Arthroscopic distal clavicle resection of 20 mm and taking the entire joint. 5.  Extensive debridement of the anterior labrum, superior labrum, posterior labrum as well as biceps stump and subacromial bursa as well as some minimal debridement of the glenoid.  SURGEON:  Alta Corning, MD.  ASSISTANT:  Gaspar Skeeters, PA-C was present entire case and assisted by retraction of tissues, manipulation of the arm, and closing to minimize OR time.  HISTORY OF PRESENT ILLNESS:  The patient is a 77 year old female with a history of having had a rotator cuff repair many years ago.  She had done well up until recently when she began having increasing pain, difficulty sleeping on the shoulder, exam was  consistent with impingement.  MRI was obtained after failure of conservative care, which showed a high-grade partial thickness rotator cuff tear of the supraspinatus as well as severe bicipital tendinopathy.  Because of failure of conservative care she  was taken to the operating room for fixation as needed.  DESCRIPTION OF PROCEDURE:  Patient was brought to the operating room.  After adequate anesthesia obtained with general  anesthetic, the patient was placed supine on the operating table and then moved into the beach chair position.  The left arm was  prepped and draped in the usual sterile fashion.  Following this, routine arthroscopic examination of shoulder revealed that there was a definite impending biceps tendon rupture.  I released the biceps tendon and the joint, which gave access to the  superior labral tear, which was torn in the front, torn at the superior portion and posteriorly.  This was debrided as well as a biceps tendon stump.  Attention we looked at the rotator cuff on the undersurface and debrided the undersurface of the  supraspinatus, which clearly had a tear.  The remainder of the cuff actually looked much better than anticipated.  At this point, attention was turned towards the subacromial space.  An anterolateral acromioplasty was performed from lateral and posterior  compartment.  The distal clavicle was resected.  There were 20 mm in the anterior compartment and the Arthrocare wand was used to cauterize the end of the clavicle to prevent clavicular regrowth. At this point, attention was turned to the rotator cuff  from the top side and identified the tear of the distal supraspinatus fibers.  At this point, the arthroscopic portion was ended and a small incision was made over the lateral side of the shoulder, incorporating the lateral portal, subcutaneous tissue  down to level of the deltoid and the fibers were divided and the Arthrex retractor was put in place.  Bursectomy was performed.  Rotator cuff tear of the supraspinatus was identified and this was debrided widening the cuff tear until we got to good cuff  tissue.  At that point, I thought a single medial row anchor should fix it.  We put a medial row anchor at the margin of the articular cartilage, hammered that into place and then passed the 4 limbs of suture through the rotator cuff and brought it over  to a single lateral SwiveLock and got an  excellent repair at that point, attention was then turned to the biceps tendon, which was definitely ragged, but I thought we went distal enough in the groove.  We can get some good tendon and we did that and then  took an all suture anchor with 2 FiberTapes and passed it into the bicipital groove, verified the bone around the bicipital groove and then put 3 stitches from both limbs of labral tape into the biceps tendon and then anchored it in place in the groove  by way of biceps tenodesis.  At this point, the remaining stump of the biceps tendon was resected and irrigation was undertaken of the wound as well as the subacromial space.  The deltoid was closed with #1 Vicryl running. The skin with 0 and 2-0 Vicryl  and 3-0 Monocryl subcuticular.  Benzoin and Steri-Strips were applied.  Of note, Gaspar Skeeters was present entire case and assisted by retraction of tissues, manipulation of the arm, and closing to minimize or time.   PUS D: 01/11/2023 12:24:28 pm T: 01/11/2023 12:41:00 pm  JOB: [id number]

## 2023-01-11 NOTE — H&P (Signed)
A pre op hand p   Chief Complaint: Left shoulder pain  HPI: Beth Hubbard is a 77 y.o. female who presents for evaluation of left shoulder pain. It has been present for greater than 3 months and has been worsening. She has failed conservative measures. Pain is rated as moderate.  Past Medical History:  Diagnosis Date   Arthritis    Diabetes mellitus without complication (HCC)    Excessive daytime sleepiness    Glaucoma    High cholesterol    Hyperlipemia    Hypertension    Mitral regurgitation    mild by echo 03/2017   OSA (obstructive sleep apnea) 10/18/2015   Mild OSA with AHI 12.5/hr on CPAP at 8cm H2O   Pneumonia    Shingles    TIA (transient ischemic attack)    Past Surgical History:  Procedure Laterality Date   ABDOMINAL SURGERY     CANTHOPLASTY Right 03/30/2022   Procedure: CANTHOPLASTY;  Surgeon: Delia Chimes, MD;  Location: [place];  Service: Ophthalmology;  Laterality: Right;   EYE EXAMINATION UNDER ANESTHESIA Right 03/30/2022   Procedure: EYE EXAM UNDER ANESTHESIA;  Surgeon: Delia Chimes, MD;  Location: [place];  Service: Ophthalmology;  Laterality: Right;   EYE SURGERY Bilateral    cataract surgery   ORIF ORBITAL FRACTURE Right 03/30/2022   Procedure: ORBITAL FLOOR FRACTURE REPAIR WITH IMPLANT;  Surgeon: Delia Chimes, MD;  Location: [place];  Service: Ophthalmology;  Laterality: Right;   ROTATOR CUFF REPAIR     bilateral   TEE WITHOUT CARDIOVERSION N/A 07/20/2016   Procedure: TRANSESOPHAGEAL ECHOCARDIOGRAM (TEE);  Surgeon: Pixie Casino, MD;  Location: [place];  Service: Cardiovascular;  Laterality: N/A;   TOOTH EXTRACTION Left 10/09/2019   Procedure: DENTAL EXTRACTION X1 and Irrigation and Debridement.;  Surgeon: Diona Browner, DDS;  Location: [facility];  Service: Oral Surgery;  Laterality: Left;  DENTAL EXTRACTION X1 and Irrigation and Debridement.   Social History   Socioeconomic History   Marital status: Legally Separated    Spouse name: Not on file    Number of children: Not on file   Years of education: Not on file   Highest education level: Not on file  Occupational History   Not on file  Tobacco Use   Smoking status: Never   Smokeless tobacco: Never  Vaping Use   Vaping Use: Never used  Substance and Sexual Activity   Alcohol use: Never   Drug use: Never   Sexual activity: Not Currently  Other Topics Concern   Not on file  Social History Narrative   ** Merged History Encounter **       Social Determinants of Health   Financial Resource Strain: Low Risk  (08/15/2021)   Overall Financial Resource Strain (CARDIA)    Difficulty of Paying Living Expenses: Not hard at all  Food Insecurity: Food Insecurity Present (08/15/2021)   Hunger Vital Sign    Worried About Running Out of Food in the Last Year: Never true    [place] in the Last Year: Sometimes true  Transportation Needs: No Transportation Needs (08/21/2022)   PRAPARE - Hydrologist (Medical): No    Lack of Transportation (Non-Medical): No  Physical Activity: Sufficiently Active (08/15/2021)   Exercise Vital Sign    Days of Exercise per Week: 7 days    Minutes of Exercise per Session: 150+ min  Stress: No Stress Concern Present (08/15/2021)   [place]  Stress Questionnaire    Feeling of Stress : Only a little  Social Connections: Moderately Integrated (08/15/2021)   Social Connection and Isolation Panel [NHANES]    Frequency of Communication with Friends and Family: More than three times a week    Frequency of Social Gatherings with Friends and Family: Once a week    Attends Religious Services: More than 4 times per year    Active Member of [employer] or Organizations: Yes    Attends Music therapist: More than 4 times per year    Marital Status: Separated   Family History  Problem Relation Age of Onset   Cancer Father    Cancer Sister    Cancer Brother    Breast cancer Neg Hx     Cancer Sister    Cancer Brother    Allergies  Allergen Reactions   Aspirin     Tremor    Aspirin Other (See Comments)    Ringing in Ear   Ibuprofen Other (See Comments)    Ringing in Ear   Penicillins Itching and Other (See Comments)    Reaction: itching,headache, dizziness and anxiety. Feels like she is "goin off" Did it involve swelling of the face/tongue/throat, SOB, or low BP? No Did it involve sudden or severe rash/hives, skin peeling, or any reaction on the inside of your mouth or nose? No Did you need to seek medical attention at a hospital or doctor's office? No When did it last happen?      Childhood If all above answers are "NO", may proceed with cephalosporin use.   Penicillins Other (See Comments)    Patient fainted   Nsaids Anxiety    Reaction: causes dizziness and headache. Ibuprofen. Aspirin   Prior to Admission medications   Medication Sig Start Date End Date Taking? Authorizing Provider  amLODipine (NORVASC) 10 MG tablet Take 1 tablet (10 mg total) by mouth daily. 08/20/22  Yes Danford, Suann Larry, MD  brimonidine-timolol (COMBIGAN) 0.2-0.5 % ophthalmic solution Place 1 drop into both eyes 2 (two) times daily. 07/24/22  Yes [provider]  dorzolamide (TRUSOPT) 2 % ophthalmic solution Place 1 drop into both eyes 2 (two) times daily. 06/11/22  Yes [provider]  gabapentin (NEURONTIN) 300 MG capsule TAKE 1 CAPSULE BY MOUTH EVERY NIGHT AT BEDTIME 12/26/22  Yes Fenton Foy, NP  ketotifen (ZADITOR) 0.025 % ophthalmic solution Place 1 drop into both eyes 2 (two) times daily as needed (itching).   Yes [provider]  latanoprost (XALATAN) 0.005 % ophthalmic solution 1 drop at bedtime.   Yes [provider]  meloxicam (MOBIC) 15 MG tablet Take 15 mg by mouth daily.   Yes [provider]  Omega-3 Fatty Acids (FISH OIL) 1000 MG CAPS Take 1,000 mg by mouth daily.   Yes [provider]  prednisoLONE acetate (PRED  FORTE) 1 % ophthalmic suspension Place 1 drop into the left eye 4 (four) times daily. 12/06/22  Yes [provider]  rosuvastatin (CRESTOR) 20 MG tablet TAKE 1 TABLET(20 MG) BY MOUTH DAILY 10/03/22  Yes Fenton Foy, NP  sitaGLIPtin (JANUVIA) 50 MG tablet TAKE 1 TABLET(50 MG) BY MOUTH DAILY 10/01/22  Yes Fenton Foy, NP  valsartan (DIOVAN) 40 MG tablet Take 1 tablet (40 mg total) by mouth daily. 10/31/22  Yes Fenton Foy, NP  ACCU-CHEK AVIVA PLUS test strip Reported on 02/24/2016 09/23/20   Vevelyn Francois, NP  Accu-Chek Softclix Lancets lancets Check BS once or twice  a day. 09/23/20   Vevelyn Francois, NP  blood glucose meter kit and supplies KIT Dispense based on patient and insurance preference. Use up to four times daily as directed. 09/25/22   Fenton Foy, NP  Blood Pressure Monitor KIT 1 kit by Does not apply route daily. 03/24/21   Vevelyn Francois, NP  cycloSPORINE (RESTASIS) 0.05 % ophthalmic emulsion Place 1 drop into both eyes 2 (two) times daily. Patient not taking: Reported on 11/30/2022    [provider]     Positive ROS: None  All other systems have been reviewed and were otherwise negative with the exception of those mentioned in the HPI and as above.  Physical Exam: There were no vitals filed for this visit.  General: Alert, no acute distress Cardiovascular: No pedal edema Respiratory: No cyanosis, no use of accessory musculature GI: No organomegaly, abdomen is soft and non-tender Skin: No lesions in the area of chief complaint Neurologic: Sensation intact distally Psychiatric: Patient is competent for consent with normal mood and affect Lymphatic: No axillary or cervical lymphadenopathy  MUSCULOSKELETAL: Left shoulder: Some slight weakness in external rotation.  Marked primary and secondary impingement findings.  MRI: MRI shows some significant fraying of the previously repaired rotator cuff.  Evidence of significant  degeneration  Assessment/Plan: LEFT SHOULDER ROTATOR CUFF TENDINOPATHY Plan for Procedure(s): ARTHROSCOPY SHOULDER POSSIBLE MINI OPEN ROTATOR CUFF REPAIR  The risks benefits and alternatives were discussed with the patient including but not limited to the risks of nonoperative treatment, versus surgical intervention including infection, bleeding, nerve injury, malunion, nonunion, hardware prominence, hardware failure, need for hardware removal, blood clots, cardiopulmonary complications, morbidity, mortality, among others, and they were willing to proceed.  Predicted outcome is good, although there will be at least a six to nine month expected recovery.  Alta Corning, MD 01/11/2023 7:19 AM

## 2023-01-11 NOTE — Interval H&P Note (Signed)
History and Physical Interval Note:  01/11/2023 9:42 AM  Beth Hubbard  has presented today for surgery, with the diagnosis of LEFT SHOULDER ROTATOR CUFF TENDINOPATHY.  The various methods of treatment have been discussed with the patient and family. After consideration of risks, benefits and other options for treatment, the patient has consented to  Procedure(s): ARTHROSCOPY SHOULDER POSSIBLE MINI OPEN ROTATOR CUFF REPAIR (Left) as a surgical intervention.  The patient's history has been reviewed, patient examined, no change in status, stable for surgery.  I have reviewed the patient's chart and labs.  Questions were answered to the patient's satisfaction.     Alta Corning

## 2023-01-11 NOTE — Brief Op Note (Signed)
01/11/2023  12:18 PM  PATIENT:  Beth Hubbard  77 y.o. female  PRE-OPERATIVE DIAGNOSIS:  LEFT SHOULDER ROTATOR CUFF TENDINOPATHY  POST-OPERATIVE DIAGNOSIS:  LEFT SHOULDER ROTATOR CUFF TENDINOPATHY  PROCEDURE:  Procedure(s): ARTHROSCOPY SHOULDER MINI OPEN ROTATOR CUFF REPAIR, SUBACROMIAL DECOMPRESSION AND DISTAL CLAVICLE RESECTION (Left) BICEPS TENODESIS (Left)  SURGEON:  Surgeon(s) and Role:    Dorna Leitz, MD - Primary  PHYSICIAN ASSISTANT:   ASSISTANTS: Gaspar Skeeters   ANESTHESIA:   general  EBL:  minimal  BLOOD ADMINISTERED:none  DRAINS: none   LOCAL MEDICATIONS USED:  MARCAINE     SPECIMEN:  No Specimen  DISPOSITION OF SPECIMEN:  N/A  COUNTS:  YES  TOURNIQUET:  * No tourniquets in log *  DICTATION: .Other Dictation: Dictation Number [id number]  PLAN OF CARE: Discharge to home after PACU  PATIENT DISPOSITION:  PACU - hemodynamically stable.   Delay start of Pharmacological VTE agent (>24hrs) due to surgical blood loss or risk of bleeding: no

## 2023-01-11 NOTE — Anesthesia Procedure Notes (Signed)
Procedure Name: Intubation Date/Time: 01/11/2023 10:36 AM  Performed by: Maryella Shivers, CRNAPre-anesthesia Checklist: Patient identified, Emergency Drugs available, Suction available and Patient being monitored Patient Re-evaluated:Patient Re-evaluated prior to induction Oxygen Delivery Method: Circle system utilized Preoxygenation: Pre-oxygenation with 100% oxygen Induction Type: IV induction Ventilation: Mask ventilation without difficulty Laryngoscope Size: Mac and 3 Grade View: Grade II Tube type: Oral Tube size: 7.0 mm Number of attempts: 1 Airway Equipment and Method: Stylet and Oral airway Placement Confirmation: ETT inserted through vocal cords under direct vision, positive ETCO2 and breath sounds checked- equal and bilateral Secured at: 21 cm Tube secured with: Tape Dental Injury: Teeth and Oropharynx as per pre-operative assessment

## 2023-01-11 NOTE — Progress Notes (Signed)
Assisted Dr. Gifford Shave with left, interscalene , ultrasound guided block. Side rails up, monitors on throughout procedure. See vital signs in flow sheet. Tolerated Procedure well.

## 2023-01-11 NOTE — Telephone Encounter (Signed)
Caller & Relationship to patient:  MRN #  [id number]   Call Back Number:   Date of Last Office Visit: 01/09/2023     Date of Next Office Visit: 03/01/2023    Medication(s) to be Refilled: Amlodipine  Preferred Pharmacy:   ** Please notify patient to allow 48-72 hours to process** **Let patient know to contact pharmacy at the end of the day to make sure medication is ready. ** **If patient has not been seen in a year or longer, book an appointment **Advise to use MyChart for refill requests OR to contact their pharmacy

## 2023-01-12 NOTE — Anesthesia Postprocedure Evaluation (Signed)
Anesthesia Post Note  Patient: Beth Hubbard  Procedure(s) Performed: ARTHROSCOPY SHOULDER MINI OPEN ROTATOR CUFF REPAIR, SUBACROMIAL DECOMPRESSION AND DISTAL CLAVICLE RESECTION (Left: Shoulder) BICEPS TENODESIS (Left: Shoulder)     Patient location during evaluation: PACU Anesthesia Type: General Level of consciousness: awake and alert Pain management: pain level controlled Vital Signs Assessment: post-procedure vital signs reviewed and stable Respiratory status: spontaneous breathing, nonlabored ventilation, respiratory function stable and patient connected to nasal cannula oxygen Cardiovascular status: blood pressure returned to baseline and stable Postop Assessment: no apparent nausea or vomiting Anesthetic complications: no   No notable events documented.  Last Vitals:  Vitals:   01/11/23 1230 01/11/23 1300  BP: (!) 166/66 (!) 168/58  Pulse: 64 69  Resp: 14 16  Temp:  36.6 C  SpO2: 100% 93%    Last Pain:  Vitals:   01/11/23 1300  TempSrc:   PainSc: 0-No pain                 Santa Lighter

## 2023-01-14 ENCOUNTER — Encounter (HOSPITAL_BASED_OUTPATIENT_CLINIC_OR_DEPARTMENT_OTHER): Payer: Self-pay | Admitting: Orthopedic Surgery

## 2023-01-14 ENCOUNTER — Other Ambulatory Visit: Payer: Self-pay

## 2023-01-14 NOTE — Telephone Encounter (Signed)
Error

## 2023-01-16 ENCOUNTER — Telehealth: Payer: Self-pay | Admitting: Nurse Practitioner

## 2023-01-16 NOTE — Telephone Encounter (Signed)
Contacted Beth Hubbard to schedule their annual wellness visit. Appointment made for 01/24/2023.  Thank you,  [facility] Direct dial  [phone]

## 2023-01-19 IMAGING — CT CT HEAD W/O CM
3 series · 15 of 46 positions shown, 18 images · non-contrast
Comparison: CT dated 12/28/2014.

CLINICAL DATA: 74-year-old female with trauma.

EXAM:
CT HEAD WITHOUT CONTRAST
CT CERVICAL SPINE WITHOUT CONTRAST
TECHNIQUE: Multidetector CT imaging of the head and cervical spine was
performed following the standard protocol without intravenous
contrast. Multiplanar CT image reconstructions of the cervical spine
were also generated.

[Series 3: head wo · axial · 0.42mm/px · z∈[-126,-6]mm · 9 of 29 slices shown, 12 images]
[im 3/29  brain]
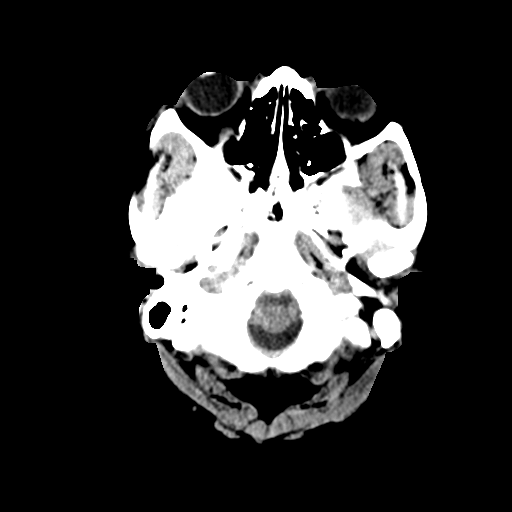
[im 3/29  bone]
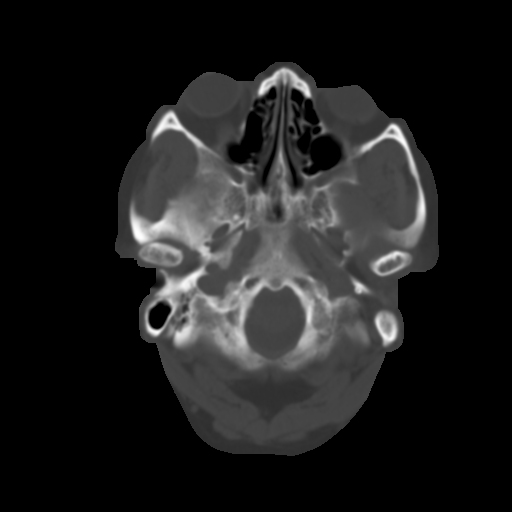
[im 6/29  brain]
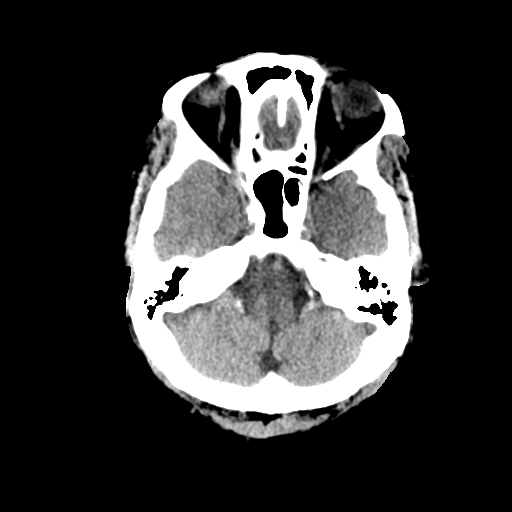
[im 9/29  brain]
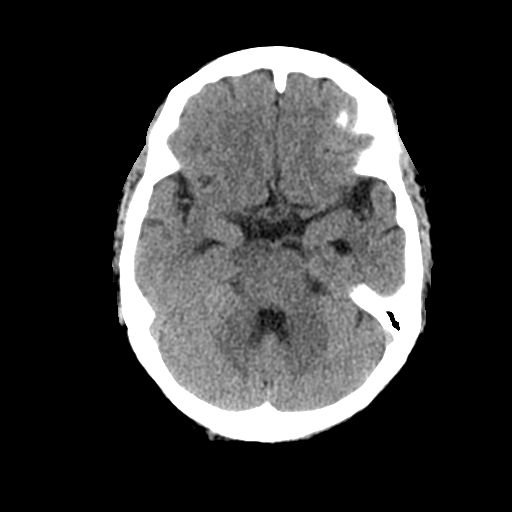
[im 12/29  brain]
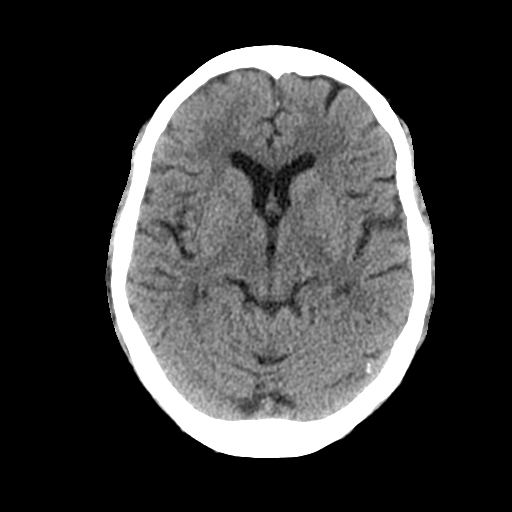
[im 15/29  brain]
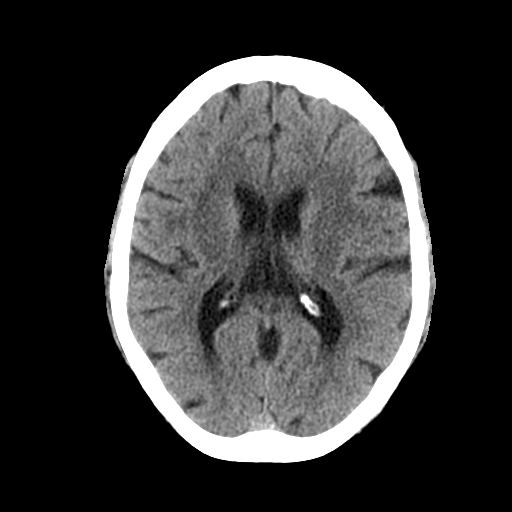
[im 15/29  bone]
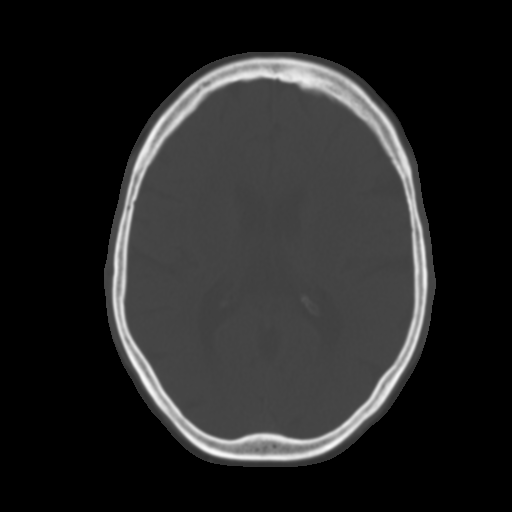
[im 18/29  brain]
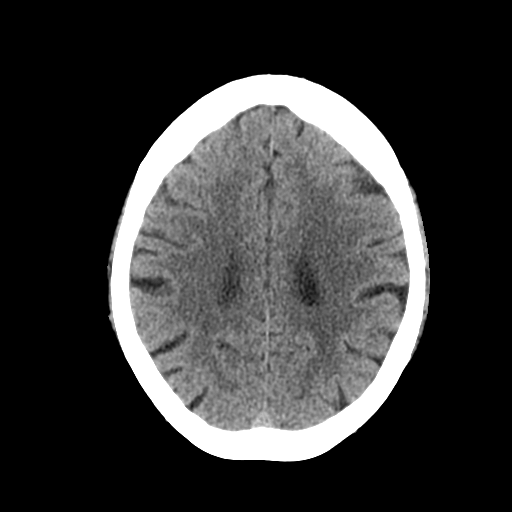
[im 21/29  brain]
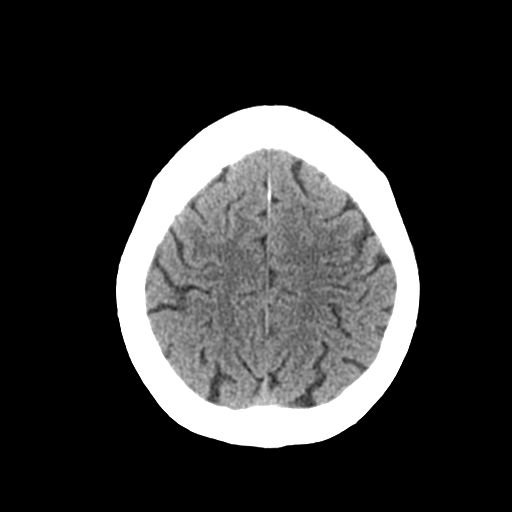
[im 24/29  brain]
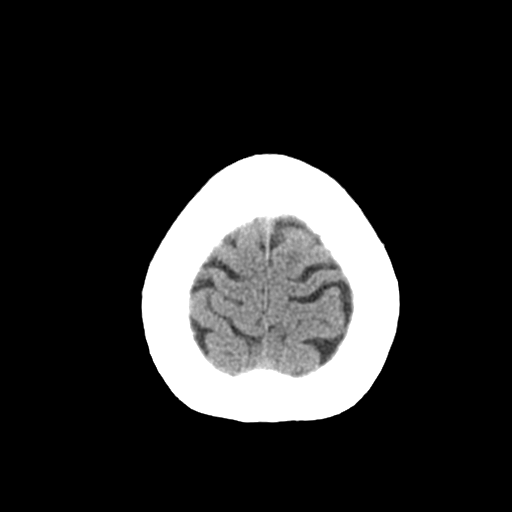
[im 27/29  brain]
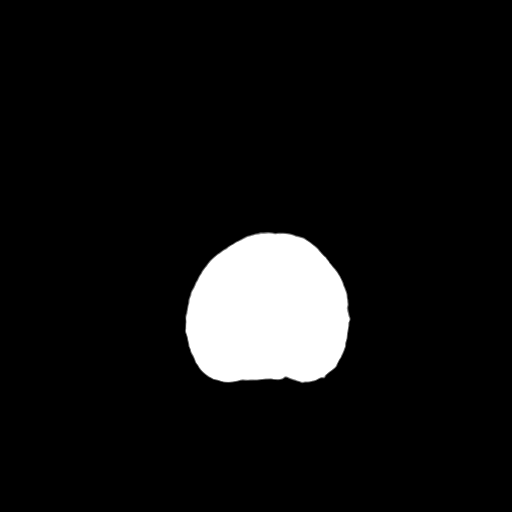
[im 27/29  bone]
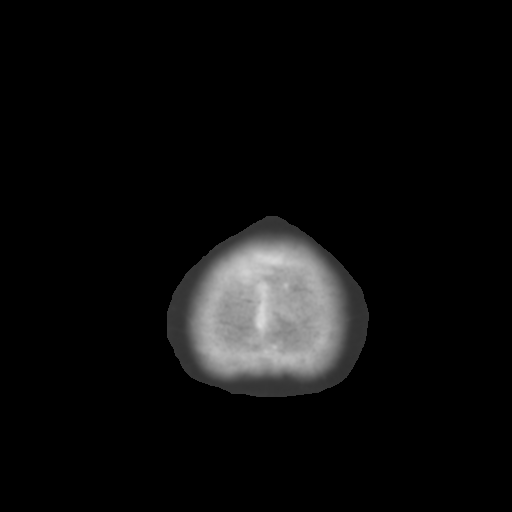

[Series 6: coronal soft tissue · coronal · 0.30mm/px · 3 of 63 slices shown]
[im 21/63  brain]
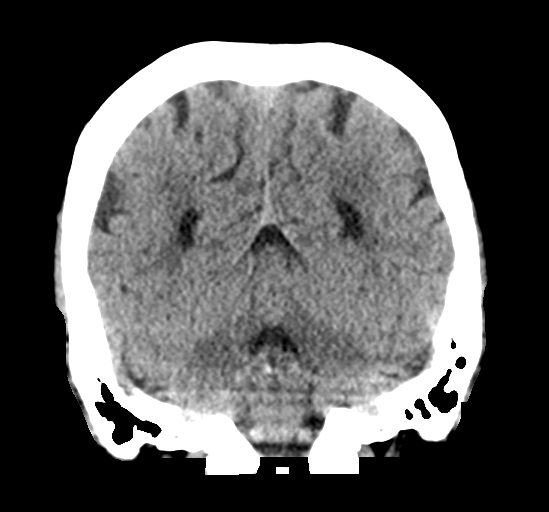
[im 28/63  brain]
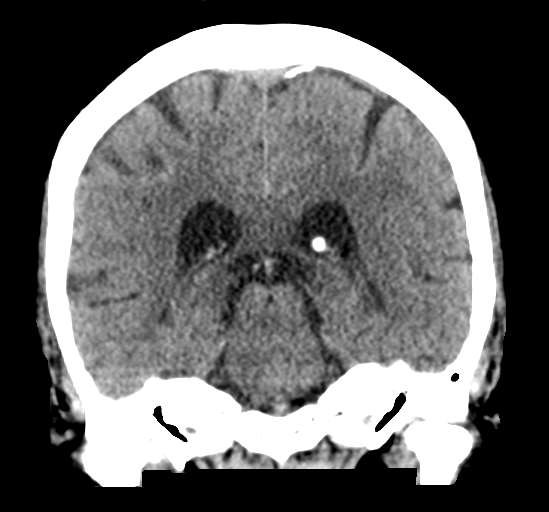
[im 35/63  brain]
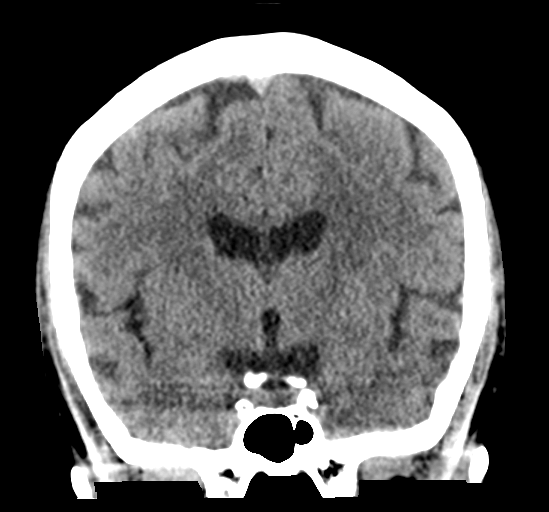

[Series 7: sagittal soft tissue · sagittal · 0.30mm/px · 3 of 55 slices shown]
[im 19/55  brain]
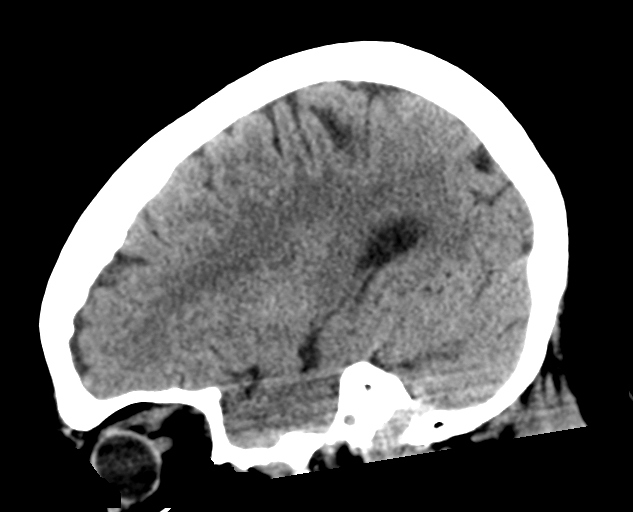
[im 28/55  brain]
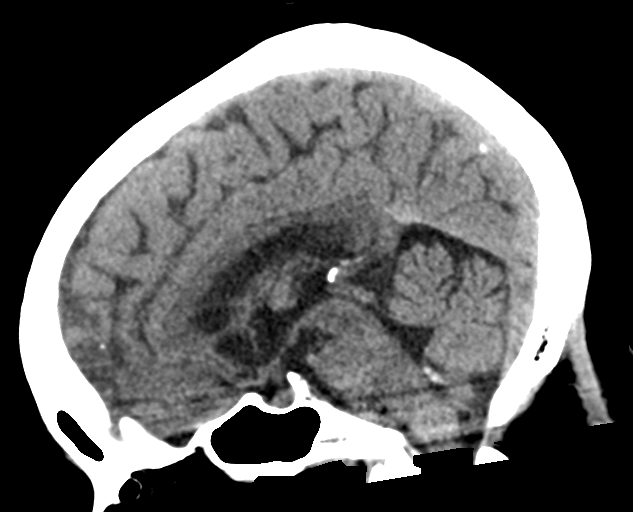
[im 37/55  brain]
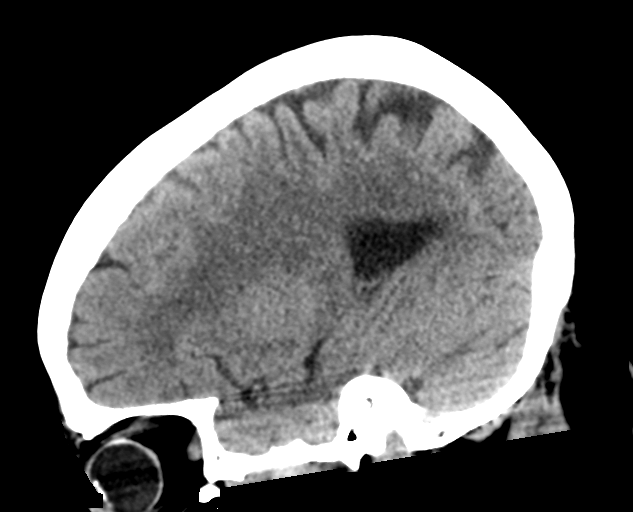

[15 of 46 positions shown; findings below may reference images not displayed]

FINDINGS: CT HEAD FINDINGS

Brain: The ventricles and sulci appropriate size for patient's age.
Mild periventricular and deep white matter chronic microvascular
ischemic changes noted. There is no acute intracranial hemorrhage.
No mass effect midline shift. No extra-axial fluid collection

Vascular: No hyperdense vessel or unexpected calcification.

Skull: Normal. Negative for fracture or focal lesion.

Sinuses/Orbits: No acute finding.

Other: None

CT CERVICAL SPINE FINDINGS

Alignment: No acute subluxation.

Skull base and vertebrae: No acute fracture.

Soft tissues and spinal canal: No prevertebral fluid or swelling. No
visible canal hematoma.

Disc levels: Multilevel degenerative changes. Multilevel facet
arthropathy.

Upper chest: Biapical subpleural calcified plaque.

Other: None
IMPRESSION: 1. No acute intracranial pathology. Mild chronic microvascular
ischemic changes.
2. No acute/traumatic cervical spine pathology. Multilevel
degenerative changes.

## 2023-01-24 ENCOUNTER — Ambulatory Visit (INDEPENDENT_AMBULATORY_CARE_PROVIDER_SITE_OTHER): Payer: 59

## 2023-01-24 VITALS — Ht 64.0 in | Wt 153.0 lb

## 2023-01-24 DIAGNOSIS — Z0000 Encounter for general adult medical examination without abnormal findings: Secondary | ICD-10-CM | POA: Diagnosis not present

## 2023-01-24 NOTE — Progress Notes (Signed)
Subjective:   Beth Hubbard is a 77 y.o. female who presents for Medicare Annual (Subsequent) preventive examination.  Review of Systems    Virtual Visit via Telephone Note  I connected with  Jewelie Boyster Loudin on 01/24/23 at  8:45 AM EST by telephone and verified that I am speaking with the correct person using two identifiers.  Location: Patient: Home Provider: Office Persons participating in the virtual visit: patient/Nurse Health Advisor   I discussed the limitations, risks, security and privacy concerns of performing an evaluation and management service by telephone and the availability of in person appointments. The patient expressed understanding and agreed to proceed.  Interactive audio and video telecommunications were attempted between this nurse and patient, however failed, due to patient having technical difficulties OR patient did not have access to video capability.  We continued and completed visit with audio only.  Some vital signs may be absent or patient reported.   Criselda Peaches, LPN  Cardiac Risk Factors include: advanced age (>4mn, >>64women);diabetes mellitus;hypertension     Objective:    Today's Vitals   01/24/23 0845  Weight: 153 lb (69.4 kg)  Height: '5\' 4"'$  (1.626 m)  PainSc: 0-No pain   Body mass index is 26.26 kg/m.     01/24/2023    9:01 AM 01/11/2023    8:12 AM 08/18/2022    9:50 PM 08/18/2022    9:46 AM 03/30/2022    6:56 AM 03/04/2022   11:09 PM 03/03/2022   10:29 PM  Advanced Directives  Does Patient Have a Medical Advance Directive? No No No No No  No  Does patient want to make changes to medical advance directive?     No - Patient declined    Would patient like information on creating a medical advance directive? No - Patient declined No - Patient declined No - Patient declined No - Patient declined  No - Patient declined     Current Medications (verified) Outpatient Encounter Medications as of 01/24/2023  Medication Sig   meloxicam  (MOBIC) 7.5 MG tablet Take 7.5 mg by mouth daily.   ACCU-CHEK AVIVA PLUS test strip Reported on 02/24/2016   Accu-Chek Softclix Lancets lancets Check BS once or twice a day.   amLODipine (NORVASC) 10 MG tablet Take 1 tablet (10 mg total) by mouth daily.   blood glucose meter kit and supplies KIT Dispense based on patient and insurance preference. Use up to four times daily as directed.   Blood Pressure Monitor KIT 1 kit by Does not apply route daily.   brimonidine-timolol (COMBIGAN) 0.2-0.5 % ophthalmic solution Place 1 drop into both eyes 2 (two) times daily.   cycloSPORINE (RESTASIS) 0.05 % ophthalmic emulsion Place 1 drop into both eyes 2 (two) times daily. (Patient not taking: Reported on 11/30/2022)   dorzolamide (TRUSOPT) 2 % ophthalmic solution Place 1 drop into both eyes 2 (two) times daily.   gabapentin (NEURONTIN) 300 MG capsule TAKE 1 CAPSULE BY MOUTH EVERY NIGHT AT BEDTIME   HYDROcodone-acetaminophen (NORCO) 5-325 MG tablet Take 1-2 tablets by mouth every 6 (six) hours as needed for moderate pain.   ketotifen (ZADITOR) 0.025 % ophthalmic solution Place 1 drop into both eyes 2 (two) times daily as needed (itching).   latanoprost (XALATAN) 0.005 % ophthalmic solution 1 drop at bedtime.   Omega-3 Fatty Acids (FISH OIL) 1000 MG CAPS Take 1,000 mg by mouth daily.   prednisoLONE acetate (PRED FORTE) 1 % ophthalmic suspension Place 1 drop into the left  eye 4 (four) times daily.   rosuvastatin (CRESTOR) 20 MG tablet TAKE 1 TABLET(20 MG) BY MOUTH DAILY   sitaGLIPtin (JANUVIA) 50 MG tablet TAKE 1 TABLET(50 MG) BY MOUTH DAILY   tiZANidine (ZANAFLEX) 2 MG tablet Take 1 tablet (2 mg total) by mouth every 12 (twelve) hours.   valsartan (DIOVAN) 40 MG tablet Take 1 tablet (40 mg total) by mouth daily.   No facility-administered encounter medications on file as of 01/24/2023.    Allergies (verified) Aspirin, Aspirin, Ibuprofen, Penicillins, Penicillins, and Nsaids   History: Past Medical History:   Diagnosis Date   Arthritis    Diabetes mellitus without complication (HCC)    Excessive daytime sleepiness    Glaucoma    High cholesterol    Hyperlipemia    Hypertension    Mitral regurgitation    mild by echo 03/2017   OSA (obstructive sleep apnea) 10/18/2015   Mild OSA with AHI 12.5/hr on CPAP at 8cm H2O   Pneumonia    Shingles    TIA (transient ischemic attack)    Past Surgical History:  Procedure Laterality Date   ABDOMINAL SURGERY     BICEPT TENODESIS Left 01/11/2023   Procedure: BICEPS TENODESIS;  Surgeon: Dorna Leitz, MD;  Location: [place];  Service: Orthopedics;  Laterality: Left;   CANTHOPLASTY Right 03/30/2022   Procedure: CANTHOPLASTY;  Surgeon: Delia Chimes, MD;  Location: [place];  Service: Ophthalmology;  Laterality: Right;   EYE EXAMINATION UNDER ANESTHESIA Right 03/30/2022   Procedure: EYE EXAM UNDER ANESTHESIA;  Surgeon: Delia Chimes, MD;  Location: [facility];  Service: Ophthalmology;  Laterality: Right;   EYE SURGERY Bilateral    cataract surgery   ORIF ORBITAL FRACTURE Right 03/30/2022   Procedure: ORBITAL FLOOR FRACTURE REPAIR WITH IMPLANT;  Surgeon: Delia Chimes, MD;  Location: [place];  Service: Ophthalmology;  Laterality: Right;   ROTATOR CUFF REPAIR     bilateral   SHOULDER ARTHROSCOPY WITH OPEN ROTATOR CUFF REPAIR AND DISTAL CLAVICLE ACROMINECTOMY Left 01/11/2023   Procedure: ARTHROSCOPY SHOULDER MINI OPEN ROTATOR CUFF REPAIR, SUBACROMIAL DECOMPRESSION AND DISTAL CLAVICLE RESECTION;  Surgeon: Dorna Leitz, MD;  Location: [facility];  Service: Orthopedics;  Laterality: Left;   TEE WITHOUT CARDIOVERSION N/A 07/20/2016   Procedure: TRANSESOPHAGEAL ECHOCARDIOGRAM (TEE);  Surgeon: Pixie Casino, MD;  Location: [facility];  Service: Cardiovascular;  Laterality: N/A;   TOOTH EXTRACTION Left 10/09/2019   Procedure: DENTAL EXTRACTION X1 and Irrigation and Debridement.;  Surgeon: Diona Browner, DDS;  Location: [place] [facility];  Service:  Oral Surgery;  Laterality: Left;  DENTAL EXTRACTION X1 and Irrigation and Debridement.   Family History  Problem Relation Age of Onset   Cancer Father    Cancer Sister    Cancer Brother    Breast cancer Neg Hx    Cancer Sister    Cancer Brother    Social History   Socioeconomic History   Marital status: Legally Separated    Spouse name: Not on file   Number of children: Not on file   Years of education: Not on file   Highest education level: Not on file  Occupational History   Not on file  Tobacco Use   Smoking status: Never   Smokeless tobacco: Never  Vaping Use   Vaping Use: Never used  Substance and Sexual Activity   Alcohol use: Never   Drug use: Never   Sexual activity: Not Currently  Other Topics Concern   Not on file  Social History Narrative   **  Merged History Encounter **       Social Determinants of Health   Financial Resource Strain: Low Risk  (01/24/2023)   Overall Financial Resource Strain (CARDIA)    Difficulty of Paying Living Expenses: Not hard at all  Food Insecurity: No Food Insecurity (01/24/2023)   Hunger Vital Sign    Worried About Running Out of Food in the Last Year: Never true    Ran Out of Food in the Last Year: Never true  Transportation Needs: No Transportation Needs (01/24/2023)   PRAPARE - Hydrologist (Medical): No    Lack of Transportation (Non-Medical): No  Physical Activity: Insufficiently Active (01/24/2023)   Exercise Vital Sign    Days of Exercise per Week: 2 days    Minutes of Exercise per Session: 10 min  Stress: No Stress Concern Present (01/24/2023)   [place]    Feeling of Stress : Not at all  Social Connections: Moderately Integrated (01/24/2023)   Social Connection and Isolation Panel [NHANES]    Frequency of Communication with Friends and Family: More than three times a week    Frequency of Social Gatherings with Friends and  Family: More than three times a week    Attends Religious Services: More than 4 times per year    Active Member of [employer] or Organizations: Yes    Attends Music therapist: More than 4 times per year    Marital Status: Separated    Tobacco Counseling Counseling given: Not Answered   Clinical Intake:  Pre-visit preparation completed: No  Pain : No/denies pain Pain Score: 0-No pain   Nutrition Risk Assessment:  Has the patient had any N/V/D within the last 2 months?  No  Does the patient have any non-healing wounds?  No  Has the patient had any unintentional weight loss or weight gain?  No   Diabetes:  Is the patient diabetic?  Yes  If diabetic, was a CBG obtained today?  No  Did the patient bring in their glucometer from home?  No  How often do you monitor your CBG's? 2-3 times weekly.   Financial Strains and Diabetes Management:  Are you having any financial strains with the device, your supplies or your medication? No .  Does the patient want to be seen by Chronic Care Management for management of their diabetes?  No  Would the patient like to be referred to a Nutritionist or for Diabetic Management?  No   Diabetic Exams:  Diabetic Eye Exam: Completed No. Overdue for diabetic eye exam. Pt has been advised about the importance in completing this exam. A referral has been placed today. Message sent to referral coordinator for scheduling purposes. Advised pt to expect a call from office referred to regarding appt.  Diabetic Foot Exam: Completed No. Pt has been advised about the importance in completing this exam. Pt is scheduled for diabetic foot exam on Followed by PCP.    BMI - recorded: 26.26 Nutritional Status: BMI 25 -29 Overweight Nutritional Risks: None Diabetes: Yes CBG done?: No Did pt. bring in CBG monitor from home?: No  How often do you need to have someone help you when you read instructions, pamphlets, or other written materials from your  doctor or pharmacy?: 3 - Sometimes (Family Assist)  Diabetic?  Yes  Interpreter Needed?: No  Information entered by :: Rolene Arbour LPN   Activities of Daily Living    01/24/2023  8:53 AM 01/11/2023    8:18 AM  In your present state of health, do you have any difficulty performing the following activities:  Hearing? 0 0  Vision? 0 0  Difficulty concentrating or making decisions? 0 0  Walking or climbing stairs? 1 1  Comment Uses a cane   Dressing or bathing? 0 0  Doing errands, shopping? [place] and eating ? N   Using the Toilet? N   In the past six months, have you accidently leaked urine? N   Do you have problems with loss of bowel control? N   Managing your Medications? N   Managing your Finances? N   Housekeeping or managing your Housekeeping? Y   Comment Family Assist     Patient Care Team: Fenton Foy, NP as PCP - General (Pulmonary Disease) Sueanne Margarita, MD as PCP - Cardiology (Cardiology) Marily Memos, MD as Consulting Physician (Orthopedic Surgery) Sueanne Margarita, MD as Consulting Physician (Cardiology)  Indicate any recent Medical Services you may have received from other than [facility] providers in the past year (date may be approximate).     Assessment:   This is a routine wellness examination for [place].  Hearing/Vision screen Hearing Screening - Comments:: Denies hearing difficulties   Vision Screening - Comments:: Wears rx glasses - up to date with routine eye exams with  [place] issues and exercise activities discussed: Exercise limited by: orthopedic condition(s)   Goals Addressed               This Visit's Progress     Patient stated (pt-stated)        I want to get through therapy for my arm and go back home.       Depression Screen    01/24/2023    8:53 AM 11/30/2022    9:14 AM 08/24/2022    1:37 PM 03/09/2022    9:29 AM 01/26/2022    9:52 AM 09/22/2021    8:50 AM  03/24/2021    8:48 AM  PHQ 2/9 Scores  PHQ - 2 Score 0 0 0 0 0 0 0  PHQ- 9 Score   0 12 6      Fall Risk    01/24/2023    8:56 AM 11/30/2022    9:14 AM 08/24/2022    1:37 PM 03/09/2022    9:29 AM 01/26/2022    9:52 AM  Fall Risk   Falls in the past year? 1 0 0 1 0  Number falls in past yr: 0 0 0 1 0  Injury with Fall? 1 0 0 1 0  Comment Fx bone under rt eye. Followed by medical attention      Risk for fall due to : Mental status change No Fall Risks  History of fall(s) No Fall Risks  Follow up Falls prevention discussed;Education provided Falls evaluation completed  Falls evaluation completed Falls evaluation completed    [place]:  Any stairs in or around the home? No If so, are there any without handrails? No  Home free of loose throw rugs in walkways, pet beds, electrical cords, etc? Yes  Adequate lighting in your home to reduce risk of falls? Yes   ASSISTIVE DEVICES UTILIZED TO PREVENT FALLS:  Life alert? No  Use of a cane, walker or w/c? Yes  Grab bars in the bathroom? Yes  Shower chair or bench in shower?  Yes  Elevated toilet seat or a handicapped toilet? Yes   TIMED UP AND GO:  Was the test performed? No . Audio Visit   Cognitive Function:      03/09/2022   11:26 AM  Montreal Cognitive Assessment   Visuospatial/ Executive (0/5) 0  Naming (0/3) 2  Attention: Read list of digits (0/2) 2  Attention: Read list of letters (0/1) 1  Attention: Serial 7 subtraction starting at 100 (0/3) 0  Language: Repeat phrase (0/2) 2  Language : Fluency (0/1) 1  Abstraction (0/2) 2  Delayed Recall (0/5) 0  Orientation (0/6) 6  Total 16  Adjusted Score (based on education) 16      01/24/2023    9:01 AM 08/15/2021    6:52 PM  6CIT Screen  What Year? 0 points 0 points  What month? 0 points 0 points  What time? 0 points 0 points  Count back from 20 0 points 0 points  Months in reverse 0 points 0 points  Repeat phrase 0 points 0 points   Total Score 0 points 0 points    Immunizations Immunization History  Administered Date(s) Administered   Influenza, High Dose Seasonal PF 09/23/2018   Influenza,inj,Quad PF,6+ Mos 09/23/2020   Influenza-Unspecified 06/19/2013   PFIZER(Purple Top)SARS-COV-2 Vaccination 04/28/2020, 05/27/2020   Pneumococcal Conjugate-13 02/24/2015   Pneumococcal Polysaccharide-23 03/05/2016   Tdap 02/24/2015    TDAP status: Up to date  Flu Vaccine status: Up to date  Pneumococcal vaccine status: Up to date  Covid-19 vaccine status: Completed vaccines  Qualifies for Shingles Vaccine? Yes   Zostavax completed No   Shingrix Completed?: No.    Education has been provided regarding the importance of this vaccine. Patient has been advised to call insurance company to determine out of pocket expense if they have not yet received this vaccine. Advised may also receive vaccine at local pharmacy or Health Dept. Verbalized acceptance and understanding.  Screening Tests Health Maintenance  Topic Date Due   COVID-19 Vaccine (3 - Pfizer risk series) 02/09/2023 (Originally 06/24/2020)   INFLUENZA VACCINE  02/17/2023 (Originally 06/19/2022)   Zoster Vaccines- Shingrix (1 of 2) 04/26/2023 (Originally 09/02/1965)   OPHTHALMOLOGY EXAM  03/05/2023   HEMOGLOBIN A1C  05/31/2023   Diabetic kidney evaluation - Urine ACR  12/01/2023   FOOT EXAM  12/01/2023   Diabetic kidney evaluation - eGFR measurement  01/05/2024   Medicare Annual Wellness (AWV)  01/24/2024   DTaP/Tdap/Td (2 - Td or Tdap) 02/23/2025   Pneumonia Vaccine 56+ Years old  Completed   DEXA SCAN  Completed   Hepatitis C Screening  Completed   HPV VACCINES  Aged Out   COLONOSCOPY (Pts 45-53yr Insurance coverage will need to be confirmed)  Discontinued    Health Maintenance  There are no preventive care reminders to display for this patient.   Colorectal cancer screening: No longer required.   Mammogram status: No longer required due to  Age.  Bone Density status: Completed 03/29/16. Results reflect: Bone density results: OSTEOPOROSIS. Repeat every   years.  Lung Cancer Screening: (Low Dose CT Chest recommended if Age 291-80years, 30 pack-year currently smoking OR have quit w/in 15years.) does not qualify.    Additional Screening:  Hepatitis C Screening: does qualify; Completed 03/05/16  Vision Screening: Recommended annual ophthalmology exams for early detection of glaucoma and other disorders of the eye. Is the patient up to date with their annual eye exam?  Yes  Who is the provider or what is the name  of the office in which the patient attends annual eye exams? [facility] If pt is not established with a provider, would they like to be referred to a provider to establish care? No .   Dental Screening: Recommended annual dental exams for proper oral hygiene  Community Resource Referral / Chronic Care Management:  CRR required this visit?  No   CCM required this visit?  No      Plan:     I have personally reviewed and noted the following in the patient's chart:   Medical and social history Use of alcohol, tobacco or illicit drugs  Current medications and supplements including opioid prescriptions. Patient is currently taking opioid prescriptions. Information provided to patient regarding non-opioid alternatives. Patient advised to discuss non-opioid treatment plan with their provider. Functional ability and status Nutritional status Physical activity Advanced directives List of other physicians Hospitalizations, surgeries, and ER visits in previous 12 months Vitals Screenings to include cognitive, depression, and falls Referrals and appointments  In addition, I have reviewed and discussed with patient certain preventive protocols, quality metrics, and best practice recommendations. A written personalized care plan for preventive services as well as general preventive health recommendations were  provided to patient.     Criselda Peaches, LPN   [id number]   Nurse Notes: Patient request f/u with concerns of severe cold. Patient would like advised medication recommendation.

## 2023-01-24 NOTE — Patient Instructions (Addendum)
Beth Hubbard , Thank you for taking time to come for your Medicare Wellness Visit. I appreciate your ongoing commitment to your health goals. Please review the following plan we discussed and let me know if I can assist you in the future.   These are the goals we discussed:  Goals       Patient stated (pt-stated)      I want to get through therapy for my arm and go back home.        This is a list of the screening recommended for you and due dates:  Health Maintenance  Topic Date Due   COVID-19 Vaccine (3 - Pfizer risk series) 02/09/2023*   Flu Shot  02/17/2023*   Zoster (Shingles) Vaccine (1 of 2) 04/26/2023*   Eye exam for diabetics  03/05/2023   Hemoglobin A1C  05/31/2023   Yearly kidney health urinalysis for diabetes  12/01/2023   Complete foot exam   12/01/2023   Yearly kidney function blood test for diabetes  01/05/2024   Medicare Annual Wellness Visit  01/24/2024   DTaP/Tdap/Td vaccine (2 - Td or Tdap) 02/23/2025   Pneumonia Vaccine  Completed   DEXA scan (bone density measurement)  Completed   Hepatitis C Screening: USPSTF Recommendation to screen - Ages 48-79 yo.  Completed   HPV Vaccine  Aged Out   Colon Cancer Screening  Discontinued  *Topic was postponed. The date shown is not the original due date.  Opioid Pain Medicine Management Opioids are powerful medicines that are used to treat moderate to severe pain. When used for short periods of time, they can help you to: Sleep better. Do better in physical or occupational therapy. Feel better in the first few days after an injury. Recover from surgery. Opioids should be taken with the supervision of a trained health care provider. They should be taken for the shortest period of time possible. This is because opioids can be addictive, and the longer you take opioids, the greater your risk of addiction. This addiction can also be called opioid use disorder. What are the risks? Using opioid pain medicines for longer than 3  days increases your risk of side effects. Side effects include: Constipation. Nausea and vomiting. Breathing difficulties (respiratory depression). Drowsiness. Confusion. Opioid use disorder. Itching. Taking opioid pain medicine for a long period of time can affect your ability to do daily tasks. It also puts you at risk for: Motor vehicle crashes. Depression. Suicide. Heart attack. Overdose, which can be life-threatening. What is a pain treatment plan? A pain treatment plan is an agreement between you and your health care provider. Pain is unique to each person, and treatments vary depending on your condition. To manage your pain, you and your health care provider need to work together. To help you do this: Discuss the goals of your treatment, including how much pain you might expect to have and how you will manage the pain. Review the risks and benefits of taking opioid medicines. Remember that a good treatment plan uses more than one approach and minimizes the chance of side effects. Be honest about the amount of medicines you take and about any drug or alcohol use. Get pain medicine prescriptions from only one health care provider. Pain can be managed with many types of alternative treatments. Ask your health care provider to refer you to one or more specialists who can help you manage pain through: Physical or occupational therapy. Counseling (cognitive behavioral therapy). Good nutrition. Biofeedback. Massage. Meditation. Non-opioid medicine.  Following a gentle exercise program. How to use opioid pain medicine Taking medicine Take your pain medicine exactly as told by your health care provider. Take it only when you need it. If your pain gets less severe, you may take less than your prescribed dose if your health care provider approves. If you are not having pain, do nottake pain medicine unless your health care provider tells you to take it. If your pain is severe, do nottry  to treat it yourself by taking more pills than instructed on your prescription. Contact your health care provider for help. Write down the times when you take your pain medicine. It is easy to become confused while on pain medicine. Writing the time can help you avoid overdose. Take other over-the-counter or prescription medicines only as told by your health care provider. Keeping yourself and others safe  While you are taking opioid pain medicine: Do not drive, use machinery, or power tools. Do not sign legal documents. Do not drink alcohol. Do not take sleeping pills. Do not supervise children by yourself. Do not do activities that require climbing or being in high places. Do not go to a lake, river, ocean, spa, or swimming pool. Do not share your pain medicine with anyone. Keep pain medicine in a locked cabinet or in a secure area where pets and children cannot reach it. Stopping your use of opioids If you have been taking opioid medicine for more than a few weeks, you may need to slowly decrease (taper) how much you take until you stop completely. Tapering your use of opioids can decrease your risk of symptoms of withdrawal, such as: Pain and cramping in the abdomen. Nausea. Sweating. Sleepiness. Restlessness. Uncontrollable shaking (tremors). Cravings for the medicine. Do not attempt to taper your use of opioids on your own. Talk with your health care provider about how to do this. Your health care provider may prescribe a step-down schedule based on how much medicine you are taking and how long you have been taking it. Getting rid of leftover pills Do not save any leftover pills. Get rid of leftover pills safely by: Taking the medicine to a prescription take-back program. This is usually offered by the county or law enforcement. Bringing them to a pharmacy that has a drug disposal container. Flushing them down the toilet. Check the label or package insert of your medicine to see  whether this is safe to do. Throwing them out in the trash. Check the label or package insert of your medicine to see whether this is safe to do. If it is safe to throw it out, remove the medicine from the original container, put it into a sealable bag or container, and mix it with used coffee grounds, food scraps, dirt, or cat litter before putting it in the trash. Follow these instructions at home: Activity Do exercises as told by your health care provider. Avoid activities that make your pain worse. Return to your normal activities as told by your health care provider. Ask your health care provider what activities are safe for you. General instructions You may need to take these actions to prevent or treat constipation: Drink enough fluid to keep your urine pale yellow. Take over-the-counter or prescription medicines. Eat foods that are high in fiber, such as beans, whole grains, and fresh fruits and vegetables. Limit foods that are high in fat and processed sugars, such as fried or sweet foods. Keep all follow-up visits. This is important. Where to find support If  you have been taking opioids for a long time, you may benefit from receiving support for quitting from a local support group or counselor. Ask your health care provider for a referral to these resources in your area. Where to find more information Centers for Disease Control and Prevention (CDC): http://www.wolf.info/ [place] Food and Drug Administration (FDA): [place] Get help right away if: You may have taken too much of an opioid (overdosed). Common symptoms of an overdose: Your breathing is slower or more shallow than normal. You have a very slow heartbeat (pulse). You have slurred speech. You have nausea and vomiting. Your pupils become very small. You have other potential symptoms: You are very confused. You faint or feel like you will faint. You have cold, clammy skin. You have blue lips or fingernails. You have thoughts  of harming yourself or harming others. These symptoms may represent a serious problem that is an emergency. Do not wait to see if the symptoms will go away. Get medical help right away. Call your local emergency services (911 in the [place] Do not drive yourself to the hospital.  If you ever feel like you may hurt yourself or others, or have thoughts about taking your own life, get help right away. Go to your nearest emergency department or: Call your local emergency services (911 in the [place] Call the [facility] 718 370 6477 in the [place] Call a suicide crisis helpline, such as the [facility] [facility] at 980-726-2567 or 988 in the [place]. This is open 24 hours a day in the [place] Text the Crisis Text Line at (731)464-5478 (in the [place].). Summary Opioid medicines can help you manage moderate to severe pain for a short period of time. A pain treatment plan is an agreement between you and your health care provider. Discuss the goals of your treatment, including how much pain you might expect to have and how you will manage the pain. If you think that you or someone else may have taken too much of an opioid, get medical help right away. This information is not intended to replace advice given to you by your health care provider. Make sure you discuss any questions you have with your health care provider. Document Revised: 05/31/2021 Document Reviewed: 02/15/2021 Elsevier Patient Education  [place] directives: Advance directive discussed with you today. Even though you declined this today, please call our office should you change your mind, and we can give you the proper paperwork for you to fill out.   Conditions/risks identified: None  Next appointment: Follow up in one year for your annual wellness visit     Preventive Care 65 Years and Older, Female Preventive care refers to lifestyle choices and visits with your health care provider that  can promote health and wellness. What does preventive care include? A yearly physical exam. This is also called an annual well check. Dental exams once or twice a year. Routine eye exams. Ask your health care provider how often you should have your eyes checked. Personal lifestyle choices, including: Daily care of your teeth and gums. Regular physical activity. Eating a healthy diet. Avoiding tobacco and drug use. Limiting alcohol use. Practicing safe sex. Taking low-dose aspirin every day. Taking vitamin and mineral supplements as recommended by your health care provider. What happens during an annual well check? The services and screenings done by your health care provider during your annual well check will depend on your age, overall health, lifestyle risk factors, and  family history of disease. Counseling  Your health care provider may ask you questions about your: Alcohol use. Tobacco use. Drug use. Emotional well-being. Home and relationship well-being. Sexual activity. Eating habits. History of falls. Memory and ability to understand (cognition). Work and work Statistician. Reproductive health. Screening  You may have the following tests or measurements: Height, weight, and BMI. Blood pressure. Lipid and cholesterol levels. These may be checked every 5 years, or more frequently if you are over 82 years old. Skin check. Lung cancer screening. You may have this screening every year starting at age 11 if you have a 30-pack-year history of smoking and currently smoke or have quit within the past 15 years. Fecal occult blood test (FOBT) of the stool. You may have this test every year starting at age 39. Flexible sigmoidoscopy or colonoscopy. You may have a sigmoidoscopy every 5 years or a colonoscopy every 10 years starting at age 20. Hepatitis C blood test. Hepatitis B blood test. Sexually transmitted disease (STD) testing. Diabetes screening. This is done by checking your  blood sugar (glucose) after you have not eaten for a while (fasting). You may have this done every 1-3 years. Bone density scan. This is done to screen for osteoporosis. You may have this done starting at age 59. Mammogram. This may be done every 1-2 years. Talk to your health care provider about how often you should have regular mammograms. Talk with your health care provider about your test results, treatment options, and if necessary, the need for more tests. Vaccines  Your health care provider may recommend certain vaccines, such as: Influenza vaccine. This is recommended every year. Tetanus, diphtheria, and acellular pertussis (Tdap, Td) vaccine. You may need a Td booster every 10 years. Zoster vaccine. You may need this after age 58. Pneumococcal 13-valent conjugate (PCV13) vaccine. One dose is recommended after age 51. Pneumococcal polysaccharide (PPSV23) vaccine. One dose is recommended after age 62. Talk to your health care provider about which screenings and vaccines you need and how often you need them. This information is not intended to replace advice given to you by your health care provider. Make sure you discuss any questions you have with your health care provider. Document Released: 12/02/2015 Document Revised: 07/25/2016 Document Reviewed: 09/06/2015 Elsevier Interactive Patient Education  2017 [place] Prevention in the Home Falls can cause injuries. They can happen to people of all ages. There are many things you can do to make your home safe and to help prevent falls. What can I do on the outside of my home? Regularly fix the edges of walkways and driveways and fix any cracks. Remove anything that might make you trip as you walk through a door, such as a raised step or threshold. Trim any bushes or trees on the path to your home. Use bright outdoor lighting. Clear any walking paths of anything that might make someone trip, such as rocks or tools. Regularly  check to see if handrails are loose or broken. Make sure that both sides of any steps have handrails. Any raised decks and porches should have guardrails on the edges. Have any leaves, snow, or ice cleared regularly. Use sand or salt on walking paths during winter. Clean up any spills in your garage right away. This includes oil or grease spills. What can I do in the bathroom? Use night lights. Install grab bars by the toilet and in the tub and shower. Do not use towel bars as grab bars. Use non-skid  mats or decals in the tub or shower. If you need to sit down in the shower, use a plastic, non-slip stool. Keep the floor dry. Clean up any water that spills on the floor as soon as it happens. Remove soap buildup in the tub or shower regularly. Attach bath mats securely with double-sided non-slip rug tape. Do not have throw rugs and other things on the floor that can make you trip. What can I do in the bedroom? Use night lights. Make sure that you have a light by your bed that is easy to reach. Do not use any sheets or blankets that are too big for your bed. They should not hang down onto the floor. Have a firm chair that has side arms. You can use this for support while you get dressed. Do not have throw rugs and other things on the floor that can make you trip. What can I do in the kitchen? Clean up any spills right away. Avoid walking on wet floors. Keep items that you use a lot in easy-to-reach places. If you need to reach something above you, use a strong step stool that has a grab bar. Keep electrical cords out of the way. Do not use floor polish or wax that makes floors slippery. If you must use wax, use non-skid floor wax. Do not have throw rugs and other things on the floor that can make you trip. What can I do with my stairs? Do not leave any items on the stairs. Make sure that there are handrails on both sides of the stairs and use them. Fix handrails that are broken or loose.  Make sure that handrails are as long as the stairways. Check any carpeting to make sure that it is firmly attached to the stairs. Fix any carpet that is loose or worn. Avoid having throw rugs at the top or bottom of the stairs. If you do have throw rugs, attach them to the floor with carpet tape. Make sure that you have a light switch at the top of the stairs and the bottom of the stairs. If you do not have them, ask someone to add them for you. What else can I do to help prevent falls? Wear shoes that: Do not have high heels. Have rubber bottoms. Are comfortable and fit you well. Are closed at the toe. Do not wear sandals. If you use a stepladder: Make sure that it is fully opened. Do not climb a closed stepladder. Make sure that both sides of the stepladder are locked into place. Ask someone to hold it for you, if possible. Clearly mark and make sure that you can see: Any grab bars or handrails. First and last steps. Where the edge of each step is. Use tools that help you move around (mobility aids) if they are needed. These include: Canes. Walkers. Scooters. Crutches. Turn on the lights when you go into a dark area. Replace any light bulbs as soon as they burn out. Set up your furniture so you have a clear path. Avoid moving your furniture around. If any of your floors are uneven, fix them. If there are any pets around you, be aware of where they are. Review your medicines with your doctor. Some medicines can make you feel dizzy. This can increase your chance of falling. Ask your doctor what other things that you can do to help prevent falls. This information is not intended to replace advice given to you by your health care provider. Make  sure you discuss any questions you have with your health care provider. Document Released: 09/01/2009 Document Revised: 04/12/2016 Document Reviewed: 12/10/2014 Elsevier Interactive Patient Education  2017 [employer].

## 2023-02-11 ENCOUNTER — Encounter: Payer: Self-pay | Admitting: Podiatry

## 2023-02-13 ENCOUNTER — Telehealth: Payer: Self-pay

## 2023-02-13 ENCOUNTER — Other Ambulatory Visit: Payer: 59

## 2023-02-13 NOTE — Progress Notes (Signed)
Attempted to contact patient's daughter for scheduled appointment for medication management. Left HIPAA compliant message for patient to return my call at their convenience.    Brooklee Michelin, Pharm.D. PGY-2 Ambulatory Care Pharmacy Resident 

## 2023-02-25 ENCOUNTER — Telehealth: Payer: Self-pay

## 2023-02-25 NOTE — Telephone Encounter (Signed)
Pt forms for faxed back to [employer]. Lvm to advise [facility]

## 2023-03-01 ENCOUNTER — Ambulatory Visit: Payer: Self-pay | Admitting: Nurse Practitioner

## 2023-03-25 ENCOUNTER — Other Ambulatory Visit: Payer: 59

## 2023-04-01 ENCOUNTER — Telehealth: Payer: Self-pay | Admitting: Podiatry

## 2023-04-01 NOTE — Telephone Encounter (Signed)
Lmom for pt to call to schedule picking up diabetic shoes

## 2023-04-03 ENCOUNTER — Other Ambulatory Visit: Payer: Self-pay

## 2023-04-03 DIAGNOSIS — E785 Hyperlipidemia, unspecified: Secondary | ICD-10-CM

## 2023-04-03 MED ORDER — ROSUVASTATIN CALCIUM 20 MG PO TABS: ORAL_TABLET | ORAL | 0 refills | Status: DC

## 2023-04-05 ENCOUNTER — Ambulatory Visit (INDEPENDENT_AMBULATORY_CARE_PROVIDER_SITE_OTHER): Payer: 59

## 2023-04-05 DIAGNOSIS — M2042 Other hammer toe(s) (acquired), left foot: Secondary | ICD-10-CM

## 2023-04-05 DIAGNOSIS — E1142 Type 2 diabetes mellitus with diabetic polyneuropathy: Secondary | ICD-10-CM

## 2023-04-05 DIAGNOSIS — M2041 Other hammer toe(s) (acquired), right foot: Secondary | ICD-10-CM

## 2023-04-05 NOTE — Progress Notes (Signed)
Patient presents today to pick up diabetic shoes and insoles.  She states the shoes felt are too narrow.  I recommended we change the size to the 8 wide as this is a narrow shoe in general.   Reorder: 8 wide  Insoles were kept by [facility] to check fit with reordered shoes.  Patient will be contacted for a fitting appointment once the reordered shoes arrive in office.

## 2023-04-28 ENCOUNTER — Other Ambulatory Visit: Payer: Self-pay | Admitting: Nurse Practitioner

## 2023-04-29 NOTE — Telephone Encounter (Signed)
Lmom for pt to call to schedule picking up diabetic shoes  

## 2023-05-02 ENCOUNTER — Other Ambulatory Visit: Payer: 59

## 2023-05-06 ENCOUNTER — Ambulatory Visit (INDEPENDENT_AMBULATORY_CARE_PROVIDER_SITE_OTHER): Payer: 59 | Admitting: Podiatry

## 2023-05-06 DIAGNOSIS — M2041 Other hammer toe(s) (acquired), right foot: Secondary | ICD-10-CM

## 2023-05-06 DIAGNOSIS — E1142 Type 2 diabetes mellitus with diabetic polyneuropathy: Secondary | ICD-10-CM

## 2023-05-06 DIAGNOSIS — M2042 Other hammer toe(s) (acquired), left foot: Secondary | ICD-10-CM

## 2023-05-06 DIAGNOSIS — E119 Type 2 diabetes mellitus without complications: Secondary | ICD-10-CM

## 2023-05-06 NOTE — Progress Notes (Signed)
Patient presents today to pick up diabetic shoes and insoles.  Patient very upset the inserts were not here? Patient states that this is the 4th time she came for diabetic shoes and there was a problem.     Patient no longer wants to proceed with shoes.  Shoes will be sent back to company

## 2023-05-31 ENCOUNTER — Encounter: Payer: Self-pay | Admitting: Nurse Practitioner

## 2023-05-31 ENCOUNTER — Ambulatory Visit (INDEPENDENT_AMBULATORY_CARE_PROVIDER_SITE_OTHER): Payer: 59 | Admitting: Nurse Practitioner

## 2023-05-31 VITALS — BP 135/55 | HR 75 | Ht 64.0 in | Wt 160.8 lb

## 2023-05-31 DIAGNOSIS — M542 Cervicalgia: Secondary | ICD-10-CM | POA: Diagnosis not present

## 2023-05-31 DIAGNOSIS — I10 Essential (primary) hypertension: Secondary | ICD-10-CM

## 2023-05-31 DIAGNOSIS — R809 Proteinuria, unspecified: Secondary | ICD-10-CM

## 2023-05-31 DIAGNOSIS — E1129 Type 2 diabetes mellitus with other diabetic kidney complication: Secondary | ICD-10-CM | POA: Diagnosis not present

## 2023-05-31 DIAGNOSIS — E785 Hyperlipidemia, unspecified: Secondary | ICD-10-CM

## 2023-05-31 DIAGNOSIS — D229 Melanocytic nevi, unspecified: Secondary | ICD-10-CM

## 2023-05-31 LAB — POCT GLYCOSYLATED HEMOGLOBIN (HGB A1C): Hemoglobin A1C: 6 % — AB (ref 4.0–5.6)

## 2023-05-31 MED ORDER — ROSUVASTATIN CALCIUM 20 MG PO TABS: ORAL_TABLET | ORAL | 2 refills | Status: DC

## 2023-05-31 MED ORDER — TRAZODONE HCL 100 MG PO TABS: 100.0000 mg | ORAL_TABLET | Freq: Every day | ORAL | 2 refills | Status: DC

## 2023-05-31 MED ORDER — SITAGLIPTIN PHOSPHATE 50 MG PO TABS: ORAL_TABLET | ORAL | 2 refills | Status: DC

## 2023-05-31 MED ORDER — AMLODIPINE BESYLATE 10 MG PO TABS: 10.0000 mg | ORAL_TABLET | Freq: Every day | ORAL | 3 refills | Status: DC

## 2023-05-31 MED ORDER — GABAPENTIN 300 MG PO CAPS: ORAL_CAPSULE | ORAL | 2 refills | Status: DC

## 2023-05-31 NOTE — Patient Instructions (Signed)
1. Controlled type 2 diabetes mellitus with microalbuminuria, without long-term current use of insulin (HCC)  - POCT glycosylated hemoglobin (Hb A1C) - sitaGLIPtin (JANUVIA) 50 MG tablet; TAKE 1 TABLET(50 MG) BY MOUTH DAILY  Dispense: 30 tablet; Refill: 2 - CBC - Comprehensive metabolic panel  2. Primary hypertension  -continue current medications - low salt diet  3. Hyperlipidemia, unspecified hyperlipidemia type  - rosuvastatin (CRESTOR) 20 MG tablet; TAKE 1 TABLET(20 MG) BY MOUTH DAILY  Dispense: 30 tablet; Refill: 2  4. Cervicalgia  - gabapentin (NEURONTIN) 300 MG capsule; TAKE 1 CAPSULE BY MOUTH EVERY NIGHT AT BEDTIME  Dispense: 30 capsule; Refill: 2  5. Atypical mole  - Ambulatory referral to Dermatology  Follow up:  Follow up in 6 months

## 2023-05-31 NOTE — Progress Notes (Signed)
@Patient  ID: [name], female    DOB: 25-Jun-1946, 77 y.o.   MRN: [id number]  Chief Complaint  Patient presents with   Follow-up    Referring provider: Ivonne Andrew, NP   HPI  HARLYNN AUDAS is a 77 y.o. female who presents for follow-up of Type 2 diabetes mellitus.   Patient is not checking home blood sugars.   Home blood sugar records:  How often is blood sugars being checked: not checking Current symptoms/problems include none and have been stable. Last eye exam: march 2023 Exercise: The patient does not participate in regular exercise at present.   Denies f/c/s, n/v/d, hemoptysis, PND, leg swelling Denies chest pain or edema        Allergies  Allergen Reactions   Aspirin     Tremor    Aspirin Other (See Comments)    Ringing in Ear   Ibuprofen Other (See Comments)    Ringing in Ear   Penicillins Itching and Other (See Comments)    Reaction: itching,headache, dizziness and anxiety. Feels like she is "goin off" Did it involve swelling of the face/tongue/throat, SOB, or low BP? No Did it involve sudden or severe rash/hives, skin peeling, or any reaction on the inside of your mouth or nose? No Did you need to seek medical attention at a hospital or doctor's office? No When did it last happen?      Childhood If all above answers are "NO", may proceed with cephalosporin use.   Penicillins Other (See Comments)    Patient fainted   Nsaids Anxiety    Reaction: causes dizziness and headache. Ibuprofen. Aspirin    Immunization History  Administered Date(s) Administered   Influenza, High Dose Seasonal PF 09/23/2018   Influenza,inj,Quad PF,6+ Mos 09/23/2020   Influenza-Unspecified 06/19/2013   PFIZER(Purple Top)SARS-COV-2 Vaccination 04/28/2020, 05/27/2020   Pneumococcal Conjugate-13 02/24/2015   Pneumococcal Polysaccharide-23 03/05/2016   Tdap 02/24/2015    Past Medical History:  Diagnosis Date   Arthritis    Diabetes mellitus without complication  (HCC)    Excessive daytime sleepiness    Glaucoma    High cholesterol    Hyperlipemia    Hypertension    Mitral regurgitation    mild by echo 03/2017   OSA (obstructive sleep apnea) 10/18/2015   Mild OSA with AHI 12.5/hr on CPAP at 8cm H2O   Pneumonia    Shingles    TIA (transient ischemic attack)     Tobacco History: Social History   Tobacco Use  Smoking Status Never  Smokeless Tobacco Never   Counseling given: Not Answered   Outpatient Encounter Medications as of 05/31/2023  Medication Sig   acetaminophen (TYLENOL) 500 MG tablet Take 500 mg by mouth every 6 (six) hours as needed.   blood glucose meter kit and supplies KIT Dispense based on patient and insurance preference. Use up to four times daily as directed.   Blood Pressure Monitor KIT 1 kit by Does not apply route daily.   brimonidine-timolol (COMBIGAN) 0.2-0.5 % ophthalmic solution Place 1 drop into both eyes 2 (two) times daily.   dorzolamide (TRUSOPT) 2 % ophthalmic solution Place 1 drop into both eyes 2 (two) times daily.   doxylamine, Sleep, (UNISOM) 25 MG tablet Take 25 mg by mouth at bedtime as needed.   ketotifen (ZADITOR) 0.025 % ophthalmic solution Place 1 drop into both eyes 2 (two) times daily as needed (itching).   latanoprost (XALATAN) 0.005 % ophthalmic solution 1 drop at bedtime.   prednisoLONE  acetate (PRED FORTE) 1 % ophthalmic suspension Place 1 drop into the left eye 4 (four) times daily.   [DISCONTINUED] amLODipine (NORVASC) 10 MG tablet TAKE 1 TABLET(10 MG) BY MOUTH DAILY   [DISCONTINUED] gabapentin (NEURONTIN) 300 MG capsule TAKE 1 CAPSULE BY MOUTH EVERY NIGHT AT BEDTIME   [DISCONTINUED] rosuvastatin (CRESTOR) 20 MG tablet TAKE 1 TABLET(20 MG) BY MOUTH DAILY   [DISCONTINUED] sitaGLIPtin (JANUVIA) 50 MG tablet TAKE 1 TABLET(50 MG) BY MOUTH DAILY   [DISCONTINUED] traZODone (DESYREL) 100 MG tablet Take 100 mg by mouth at bedtime.   ACCU-CHEK AVIVA PLUS test strip Reported on 02/24/2016 (Patient not  taking: Reported on 05/31/2023)   Accu-Chek Softclix Lancets lancets Check BS once or twice a day. (Patient not taking: Reported on 05/31/2023)   amLODipine (NORVASC) 10 MG tablet Take 1 tablet (10 mg total) by mouth daily.   cycloSPORINE (RESTASIS) 0.05 % ophthalmic emulsion Place 1 drop into both eyes 2 (two) times daily. (Patient not taking: Reported on 11/30/2022)   gabapentin (NEURONTIN) 300 MG capsule TAKE 1 CAPSULE BY MOUTH EVERY NIGHT AT BEDTIME   HYDROcodone-acetaminophen (NORCO) 5-325 MG tablet Take 1-2 tablets by mouth every 6 (six) hours as needed for moderate pain. (Patient not taking: Reported on 05/31/2023)   meloxicam (MOBIC) 7.5 MG tablet Take 7.5 mg by mouth daily. (Patient not taking: Reported on 05/31/2023)   Omega-3 Fatty Acids (FISH OIL) 1000 MG CAPS Take 1,000 mg by mouth daily. (Patient not taking: Reported on 05/31/2023)   rosuvastatin (CRESTOR) 20 MG tablet TAKE 1 TABLET(20 MG) BY MOUTH DAILY   sitaGLIPtin (JANUVIA) 50 MG tablet TAKE 1 TABLET(50 MG) BY MOUTH DAILY   tiZANidine (ZANAFLEX) 2 MG tablet Take 1 tablet (2 mg total) by mouth every 12 (twelve) hours. (Patient not taking: Reported on 05/31/2023)   traZODone (DESYREL) 100 MG tablet Take 1 tablet (100 mg total) by mouth at bedtime.   valsartan (DIOVAN) 40 MG tablet Take 1 tablet (40 mg total) by mouth daily. (Patient not taking: Reported on 05/31/2023)   No facility-administered encounter medications on file as of 05/31/2023.     Review of Systems  Review of Systems  Constitutional: Negative.   HENT: Negative.    Cardiovascular: Negative.   Gastrointestinal: Negative.   Allergic/Immunologic: Negative.   Neurological: Negative.   Psychiatric/Behavioral: Negative.         Physical Exam  BP (!) 135/55   Pulse 75   Ht 5\' 4"  (1.626 m)   Wt 160 lb 12.8 oz (72.9 kg)   SpO2 99%   BMI 27.60 kg/m   Wt Readings from Last 5 Encounters:  05/31/23 160 lb 12.8 oz (72.9 kg)  01/24/23 153 lb (69.4 kg)  01/11/23 155  lb 10.3 oz (70.6 kg)  11/30/22 153 lb 12.8 oz (69.8 kg)  11/05/22 155 lb (70.3 kg)     Physical Exam Vitals and nursing note reviewed.  Constitutional:      General: She is not in acute distress.    Appearance: She is well-developed.  Cardiovascular:     Rate and Rhythm: Normal rate and regular rhythm.  Pulmonary:     Effort: Pulmonary effort is normal.     Breath sounds: Normal breath sounds.  Neurological:     Mental Status: She is alert and oriented to person, place, and time.      Lab Results:  CBC    Component Value Date/Time   WBC 5.5 11/30/2022 1000   WBC 9.4 08/19/2022 0306   RBC 3.75 (  L) 11/30/2022 1000   RBC 3.93 08/19/2022 0306   HGB 11.9 11/30/2022 1000   HCT 36.6 11/30/2022 1000   PLT 189 11/30/2022 1000   MCV 98 (H) 11/30/2022 1000   MCH 31.7 11/30/2022 1000   MCH 32.6 08/19/2022 0306   MCHC 32.5 11/30/2022 1000   MCHC 32.9 08/19/2022 0306   RDW 13.2 11/30/2022 1000   LYMPHSABS 2.8 04/17/2022 1129   MONOABS 0.5 03/03/2022 2235   EOSABS 0.1 04/17/2022 1129   BASOSABS 0.0 04/17/2022 1129    BMET    Component Value Date/Time   NA 139 01/04/2023 1242   NA 143 11/30/2022 1000   K 4.7 01/04/2023 1242   CL 102 01/04/2023 1242   CO2 27 01/04/2023 1242   GLUCOSE 105 (H) 01/04/2023 1242   BUN 17 01/04/2023 1242   BUN 14 11/30/2022 1000   CREATININE 0.92 01/04/2023 1242   CREATININE 0.70 10/02/2017 1121   CALCIUM 9.3 01/04/2023 1242   GFRNONAA >60 01/04/2023 1242   GFRNONAA 87 10/02/2017 1121   GFRAA 79 09/30/2020 1026   GFRAA 100 10/02/2017 1121    BNP No results found for: "BNP"  ProBNP    Component Value Date/Time   PROBNP 14.0 10/21/2014 0944    Imaging: No results found.   Assessment & Plan:   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HCC) - POCT glycosylated hemoglobin (Hb A1C) - sitaGLIPtin (JANUVIA) 50 MG tablet; TAKE 1 TABLET(50 MG) BY MOUTH DAILY  Dispense: 30 tablet; Refill: 2 - CBC - Comprehensive  metabolic panel  2. Primary hypertension  -continue current medications - low salt diet  3. Hyperlipidemia, unspecified hyperlipidemia type  - rosuvastatin (CRESTOR) 20 MG tablet; TAKE 1 TABLET(20 MG) BY MOUTH DAILY  Dispense: 30 tablet; Refill: 2  4. Cervicalgia  - gabapentin (NEURONTIN) 300 MG capsule; TAKE 1 CAPSULE BY MOUTH EVERY NIGHT AT BEDTIME  Dispense: 30 capsule; Refill: 2  5. Atypical mole  - Ambulatory referral to Dermatology  Follow up:  Follow up in 6 months     Ivonne Andrew, NP 05/31/2023

## 2023-05-31 NOTE — Assessment & Plan Note (Signed)
-   POCT glycosylated hemoglobin (Hb A1C) - sitaGLIPtin (JANUVIA) 50 MG tablet; TAKE 1 TABLET(50 MG) BY MOUTH DAILY  Dispense: 30 tablet; Refill: 2 - CBC - Comprehensive metabolic panel  2. Primary hypertension  -continue current medications - low salt diet  3. Hyperlipidemia, unspecified hyperlipidemia type  - rosuvastatin (CRESTOR) 20 MG tablet; TAKE 1 TABLET(20 MG) BY MOUTH DAILY  Dispense: 30 tablet; Refill: 2  4. Cervicalgia  - gabapentin (NEURONTIN) 300 MG capsule; TAKE 1 CAPSULE BY MOUTH EVERY NIGHT AT BEDTIME  Dispense: 30 capsule; Refill: 2  5. Atypical mole  - Ambulatory referral to Dermatology  Follow up:  Follow up in 6 months

## 2023-06-01 LAB — COMPREHENSIVE METABOLIC PANEL
ALT: 19 IU/L (ref 0–32)
AST: 20 IU/L (ref 0–40)
Albumin: 4.3 g/dL (ref 3.8–4.8)
Alkaline Phosphatase: 58 IU/L (ref 44–121)
BUN/Creatinine Ratio: 13 (ref 12–28)
BUN: 12 mg/dL (ref 8–27)
Bilirubin Total: <0.2 mg/dL (ref 0.0–1.2)
CO2: 23 mmol/L (ref 20–29)
Calcium: 9.4 mg/dL (ref 8.7–10.3)
Chloride: 103 mmol/L (ref 96–106)
Creatinine, Ser: 0.91 mg/dL (ref 0.57–1.00)
Globulin, Total: 3.1 g/dL (ref 1.5–4.5)
Glucose: 103 mg/dL — ABNORMAL HIGH (ref 70–99)
Potassium: 4.3 mmol/L (ref 3.5–5.2)
Sodium: 144 mmol/L (ref 134–144)
Total Protein: 7.4 g/dL (ref 6.0–8.5)
eGFR: 65 mL/min/{1.73_m2} (ref >59)

## 2023-06-01 LAB — CBC
Hematocrit: 37 % (ref 34.0–46.6)
Hemoglobin: 11.6 g/dL (ref 11.1–15.9)
MCH: 30.2 pg (ref 26.6–33.0)
MCHC: 31.4 g/dL — ABNORMAL LOW (ref 31.5–35.7)
MCV: 96 fL (ref 79–97)
Platelets: 197 10*3/uL (ref 150–450)
RBC: 3.84 x10E6/uL (ref 3.77–5.28)
RDW: 13.9 % (ref 11.7–15.4)
WBC: 5.5 10*3/uL (ref 3.4–10.8)

## 2023-06-24 ENCOUNTER — Ambulatory Visit: Payer: Self-pay | Admitting: Nurse Practitioner

## 2023-07-12 ENCOUNTER — Ambulatory Visit (INDEPENDENT_AMBULATORY_CARE_PROVIDER_SITE_OTHER): Payer: 59 | Admitting: Nurse Practitioner

## 2023-07-12 ENCOUNTER — Encounter: Payer: Self-pay | Admitting: Nurse Practitioner

## 2023-07-12 VITALS — BP 134/61 | HR 69 | Temp 98.2°F | Resp 14 | Ht 64.0 in | Wt 160.0 lb

## 2023-07-12 DIAGNOSIS — M545 Low back pain, unspecified: Secondary | ICD-10-CM

## 2023-07-12 DIAGNOSIS — G4700 Insomnia, unspecified: Secondary | ICD-10-CM

## 2023-07-12 DIAGNOSIS — N390 Urinary tract infection, site not specified: Secondary | ICD-10-CM

## 2023-07-12 LAB — POCT URINALYSIS DIP (CLINITEK)
Bilirubin, UA: NEGATIVE
Glucose, UA: NEGATIVE mg/dL
Ketones, POC UA: NEGATIVE mg/dL
Nitrite, UA: NEGATIVE
POC PROTEIN,UA: NEGATIVE
Spec Grav, UA: 1.02 (ref 1.010–1.025)
Urobilinogen, UA: 0.2 E.U./dL
pH, UA: 5.5 (ref 5.0–8.0)

## 2023-07-12 MED ORDER — SULFAMETHOXAZOLE-TRIMETHOPRIM 800-160 MG PO TABS: 1.0000 | ORAL_TABLET | Freq: Two times a day (BID) | ORAL | 0 refills | Status: AC

## 2023-07-12 MED ORDER — HYDROXYZINE PAMOATE 25 MG PO CAPS: 25.0000 mg | ORAL_CAPSULE | Freq: Every evening | ORAL | 0 refills | Status: DC | PRN

## 2023-07-12 NOTE — Assessment & Plan Note (Signed)
-   POCT URINALYSIS DIP (CLINITEK) - sulfamethoxazole-trimethoprim (BACTRIM DS) 800-160 MG tablet; Take 1 tablet by mouth 2 (two) times daily for 3 days.  Dispense: 6 tablet; Refill: 0  2. Urinary tract infection without hematuria, site unspecified  - sulfamethoxazole-trimethoprim (BACTRIM DS) 800-160 MG tablet; Take 1 tablet by mouth 2 (two) times daily for 3 days.  Dispense: 6 tablet; Refill: 0   Follow up:  Follow up as scheduled

## 2023-07-12 NOTE — Patient Instructions (Signed)
1. Acute low back pain without sciatica, unspecified back pain laterality  - POCT URINALYSIS DIP (CLINITEK) - sulfamethoxazole-trimethoprim (BACTRIM DS) 800-160 MG tablet; Take 1 tablet by mouth 2 (two) times daily for 3 days.  Dispense: 6 tablet; Refill: 0  2. Urinary tract infection without hematuria, site unspecified  - sulfamethoxazole-trimethoprim (BACTRIM DS) 800-160 MG tablet; Take 1 tablet by mouth 2 (two) times daily for 3 days.  Dispense: 6 tablet; Refill: 0   Follow up:  Follow up as scheduled

## 2023-07-12 NOTE — Progress Notes (Signed)
@Patient  ID: Beth Hubbard, female    DOB: June 25, 1946, 77 y.o.   MRN: [id number]  Chief Complaint  Patient presents with   Back Pain    Referring provider: Ivonne Andrew, NP   HPI  Beth Hubbard is a 77 y.o. female who presents for follow-up of Type 2 diabetes mellitus.    Patient presents today for acute visit.  She states that she has been having low back pain especially on the right side for the past 2 weeks.  She is concerned for UTI.  Urine in office today does show bacteria.  We will trial Bactrim and send urine off for culture.  Patient would also like to try something different for insomnia.  She is currently taking trazodone but states that she has been up for the past 2 nights.  We will trial hydroxyzine. Denies f/c/s, n/v/d, hemoptysis, PND, leg swelling Denies chest pain or edema     Allergies  Allergen Reactions   Aspirin     Tremor    Aspirin Other (See Comments)    Ringing in Ear   Ibuprofen Other (See Comments)    Ringing in Ear   Penicillins Itching and Other (See Comments)    Reaction: itching,headache, dizziness and anxiety. Feels like she is "goin off" Did it involve swelling of the face/tongue/throat, SOB, or low BP? No Did it involve sudden or severe rash/hives, skin peeling, or any reaction on the inside of your mouth or nose? No Did you need to seek medical attention at a hospital or doctor's office? No When did it last happen?      Childhood If all above answers are "NO", may proceed with cephalosporin use.   Penicillins Other (See Comments)    Patient fainted   Nsaids Anxiety    Reaction: causes dizziness and headache. Ibuprofen. Aspirin    Immunization History  Administered Date(s) Administered   Influenza, High Dose Seasonal PF 09/23/2018   Influenza,inj,Quad PF,6+ Mos 09/23/2020   Influenza-Unspecified 06/19/2013   PFIZER(Purple Top)SARS-COV-2 Vaccination 04/28/2020, 05/27/2020   Pneumococcal Conjugate-13 02/24/2015   Pneumococcal  Polysaccharide-23 03/05/2016   Tdap 02/24/2015    Past Medical History:  Diagnosis Date   Arthritis    Diabetes mellitus without complication (HCC)    Excessive daytime sleepiness    Glaucoma    High cholesterol    Hyperlipemia    Hypertension    Mitral regurgitation    mild by echo 03/2017   OSA (obstructive sleep apnea) 10/18/2015   Mild OSA with AHI 12.5/hr on CPAP at 8cm H2O   Pneumonia    Shingles    TIA (transient ischemic attack)     Tobacco History: Social History   Tobacco Use  Smoking Status Never  Smokeless Tobacco Never   Counseling given: Not Answered   Outpatient Encounter Medications as of 07/12/2023  Medication Sig   acetaminophen (TYLENOL) 500 MG tablet Take 500 mg by mouth every 6 (six) hours as needed.   amLODipine (NORVASC) 10 MG tablet Take 1 tablet (10 mg total) by mouth daily.   blood glucose meter kit and supplies KIT Dispense based on patient and insurance preference. Use up to four times daily as directed.   Blood Pressure Monitor KIT 1 kit by Does not apply route daily.   brimonidine-timolol (COMBIGAN) 0.2-0.5 % ophthalmic solution Place 1 drop into both eyes 2 (two) times daily.   dorzolamide (TRUSOPT) 2 % ophthalmic solution Place 1 drop into both eyes 2 (two) times daily.  gabapentin (NEURONTIN) 300 MG capsule TAKE 1 CAPSULE BY MOUTH EVERY NIGHT AT BEDTIME   hydrOXYzine (VISTARIL) 25 MG capsule Take 1 capsule (25 mg total) by mouth at bedtime as needed.   ketotifen (ZADITOR) 0.025 % ophthalmic solution Place 1 drop into both eyes 2 (two) times daily as needed (itching).   latanoprost (XALATAN) 0.005 % ophthalmic solution 1 drop at bedtime.   prednisoLONE acetate (PRED FORTE) 1 % ophthalmic suspension Place 1 drop into the left eye 4 (four) times daily.   rosuvastatin (CRESTOR) 20 MG tablet TAKE 1 TABLET(20 MG) BY MOUTH DAILY   sitaGLIPtin (JANUVIA) 50 MG tablet TAKE 1 TABLET(50 MG) BY MOUTH DAILY   sulfamethoxazole-trimethoprim (BACTRIM DS)  800-160 MG tablet Take 1 tablet by mouth 2 (two) times daily for 3 days.   [DISCONTINUED] doxylamine, Sleep, (UNISOM) 25 MG tablet Take 25 mg by mouth at bedtime as needed.   [DISCONTINUED] traZODone (DESYREL) 100 MG tablet Take 1 tablet (100 mg total) by mouth at bedtime.   ACCU-CHEK AVIVA PLUS test strip Reported on 02/24/2016 (Patient not taking: Reported on 05/31/2023)   Accu-Chek Softclix Lancets lancets Check BS once or twice a day. (Patient not taking: Reported on 05/31/2023)   cycloSPORINE (RESTASIS) 0.05 % ophthalmic emulsion Place 1 drop into both eyes 2 (two) times daily. (Patient not taking: Reported on 11/30/2022)   HYDROcodone-acetaminophen (NORCO) 5-325 MG tablet Take 1-2 tablets by mouth every 6 (six) hours as needed for moderate pain. (Patient not taking: Reported on 05/31/2023)   meloxicam (MOBIC) 7.5 MG tablet Take 7.5 mg by mouth daily. (Patient not taking: Reported on 05/31/2023)   Omega-3 Fatty Acids (FISH OIL) 1000 MG CAPS Take 1,000 mg by mouth daily. (Patient not taking: Reported on 05/31/2023)   tiZANidine (ZANAFLEX) 2 MG tablet Take 1 tablet (2 mg total) by mouth every 12 (twelve) hours. (Patient not taking: Reported on 05/31/2023)   valsartan (DIOVAN) 40 MG tablet Take 1 tablet (40 mg total) by mouth daily. (Patient not taking: Reported on 05/31/2023)   No facility-administered encounter medications on file as of 07/12/2023.     Review of Systems  Review of Systems  Constitutional: Negative.   HENT: Negative.    Cardiovascular: Negative.   Gastrointestinal: Negative.   Allergic/Immunologic: Negative.   Neurological: Negative.   Psychiatric/Behavioral: Negative.         Physical Exam  BP 134/61 (BP Location: Right Arm, Patient Position: Sitting, Cuff Size: Normal)   Pulse 69   Temp 98.2 F (36.8 C)   Resp 14   Ht 5\' 4"  (1.626 m)   Wt 160 lb (72.6 kg)   SpO2 99%   BMI 27.46 kg/m   Wt Readings from Last 5 Encounters:  07/12/23 160 lb (72.6 kg)  05/31/23 160  lb 12.8 oz (72.9 kg)  01/24/23 153 lb (69.4 kg)  01/11/23 155 lb 10.3 oz (70.6 kg)  11/30/22 153 lb 12.8 oz (69.8 kg)     Physical Exam Vitals and nursing note reviewed.  Constitutional:      General: She is not in acute distress.    Appearance: She is well-developed.  Cardiovascular:     Rate and Rhythm: Normal rate and regular rhythm.  Pulmonary:     Effort: Pulmonary effort is normal.     Breath sounds: Normal breath sounds.  Skin:    Findings: No rash.  Neurological:     Mental Status: She is alert and oriented to person, place, and time.      Lab  Results:  CBC    Component Value Date/Time   WBC 5.5 05/31/2023 0847   WBC 9.4 08/19/2022 0306   RBC 3.84 05/31/2023 0847   RBC 3.93 08/19/2022 0306   HGB 11.6 05/31/2023 0847   HCT 37.0 05/31/2023 0847   PLT 197 05/31/2023 0847   MCV 96 05/31/2023 0847   MCH 30.2 05/31/2023 0847   MCH 32.6 08/19/2022 0306   MCHC 31.4 (L) 05/31/2023 0847   MCHC 32.9 08/19/2022 0306   RDW 13.9 05/31/2023 0847   LYMPHSABS 2.8 04/17/2022 1129   MONOABS 0.5 03/03/2022 2235   EOSABS 0.1 04/17/2022 1129   BASOSABS 0.0 04/17/2022 1129    BMET    Component Value Date/Time   NA 144 05/31/2023 0847   K 4.3 05/31/2023 0847   CL 103 05/31/2023 0847   CO2 23 05/31/2023 0847   GLUCOSE 103 (H) 05/31/2023 0847   GLUCOSE 105 (H) 01/04/2023 1242   BUN 12 05/31/2023 0847   CREATININE 0.91 05/31/2023 0847   CREATININE 0.70 10/02/2017 1121   CALCIUM 9.4 05/31/2023 0847   GFRNONAA >60 01/04/2023 1242   GFRNONAA 87 10/02/2017 1121   GFRAA 79 09/30/2020 1026   GFRAA 100 10/02/2017 1121    BNP No results found for: "BNP"  ProBNP    Component Value Date/Time   PROBNP 14.0 10/21/2014 0944    Imaging: No results found.   Assessment & Plan:   Acute low back pain without sciatica - POCT URINALYSIS DIP (CLINITEK) - sulfamethoxazole-trimethoprim (BACTRIM DS) 800-160 MG tablet; Take 1 tablet by mouth 2 (two) times daily for 3 days.   Dispense: 6 tablet; Refill: 0  2. Urinary tract infection without hematuria, site unspecified  - sulfamethoxazole-trimethoprim (BACTRIM DS) 800-160 MG tablet; Take 1 tablet by mouth 2 (two) times daily for 3 days.  Dispense: 6 tablet; Refill: 0   Follow up:  Follow up as scheduled     Ivonne Andrew, NP 07/12/2023

## 2023-07-14 LAB — URINE CULTURE

## 2023-08-30 ENCOUNTER — Ambulatory Visit (INDEPENDENT_AMBULATORY_CARE_PROVIDER_SITE_OTHER): Payer: 59 | Admitting: Nurse Practitioner

## 2023-08-30 ENCOUNTER — Encounter: Payer: Self-pay | Admitting: Nurse Practitioner

## 2023-08-30 VITALS — BP 148/65 | HR 80 | Temp 97.1°F | Resp 18 | Ht 64.0 in | Wt 155.0 lb

## 2023-08-30 DIAGNOSIS — R059 Cough, unspecified: Secondary | ICD-10-CM

## 2023-08-30 DIAGNOSIS — J302 Other seasonal allergic rhinitis: Secondary | ICD-10-CM | POA: Diagnosis not present

## 2023-08-30 LAB — POCT INFLUENZA A/B
Influenza A, POC: NEGATIVE
Influenza B, POC: NEGATIVE

## 2023-08-30 LAB — POC COVID19 BINAXNOW: SARS Coronavirus 2 Ag: NEGATIVE

## 2023-08-30 MED ORDER — CETIRIZINE HCL 10 MG PO TABS: 10.0000 mg | ORAL_TABLET | Freq: Every day | ORAL | 11 refills | Status: DC

## 2023-08-30 NOTE — Progress Notes (Signed)
Subjective   Patient ID: Beth Hubbard, female    DOB: November 12, 1946, 77 y.o.   MRN: [id number]  Chief Complaint  Patient presents with   Cough    X 4 days, no fever   Nasal Congestion    Referring provider: Ivonne Andrew, NP  Beth Hubbard is a 77 y.o. female with Past Medical History: No date: Arthritis No date: Diabetes mellitus without complication (HCC) No date: Excessive daytime sleepiness No date: Glaucoma No date: High cholesterol No date: Hyperlipemia No date: Hypertension No date: Mitral regurgitation     Comment:  mild by echo 03/2017 10/18/2015: OSA (obstructive sleep apnea)     Comment:  Mild OSA with AHI 12.5/hr on CPAP at 8cm H2O No date: Pneumonia No date: Shingles No date: TIA (transient ischemic attack)  HPI: Cough This is a new problem. The current episode started 1 to 4 weeks ago. The problem has been gradually improving. The problem occurs every few hours. The cough is Non-productive. Pertinent negatives include no fever, headaches or nasal congestion. The symptoms are aggravated by cold air.    Allergies  Allergen Reactions   Aspirin     Tremor    Aspirin Other (See Comments)    Ringing in Ear   Ibuprofen Other (See Comments)    Ringing in Ear   Penicillins Itching and Other (See Comments)    Reaction: itching,headache, dizziness and anxiety. Feels like she is "goin off" Did it involve swelling of the face/tongue/throat, SOB, or low BP? No Did it involve sudden or severe rash/hives, skin peeling, or any reaction on the inside of your mouth or nose? No Did you need to seek medical attention at a hospital or doctor's office? No When did it last happen?      Childhood If all above answers are "NO", may proceed with cephalosporin use.   Penicillins Other (See Comments)    Patient fainted   Nsaids Anxiety    Reaction: causes dizziness and headache. Ibuprofen. Aspirin    Immunization History  Administered Date(s) Administered   Influenza,  High Dose Seasonal PF 09/23/2018   Influenza,inj,Quad PF,6+ Mos 09/23/2020   Influenza-Unspecified 06/19/2013   PFIZER(Purple Top)SARS-COV-2 Vaccination 04/28/2020, 05/27/2020   Pneumococcal Conjugate-13 02/24/2015   Pneumococcal Polysaccharide-23 03/05/2016   Tdap 02/24/2015    Tobacco History: Social History   Tobacco Use  Smoking Status Never  Smokeless Tobacco Never   Counseling given: Not Answered   Outpatient Encounter Medications as of 08/30/2023  Medication Sig   ACCU-CHEK AVIVA PLUS test strip Reported on 02/24/2016   Accu-Chek Softclix Lancets lancets Check BS once or twice a day.   acetaminophen (TYLENOL) 500 MG tablet Take 500 mg by mouth every 6 (six) hours as needed.   amLODipine (NORVASC) 10 MG tablet Take 1 tablet (10 mg total) by mouth daily.   blood glucose meter kit and supplies KIT Dispense based on patient and insurance preference. Use up to four times daily as directed.   Blood Pressure Monitor KIT 1 kit by Does not apply route daily.   brimonidine-timolol (COMBIGAN) 0.2-0.5 % ophthalmic solution Place 1 drop into both eyes 2 (two) times daily.   cetirizine (ZYRTEC) 10 MG tablet Take 1 tablet (10 mg total) by mouth daily.   dorzolamide (TRUSOPT) 2 % ophthalmic solution Place 1 drop into both eyes 2 (two) times daily.   gabapentin (NEURONTIN) 300 MG capsule TAKE 1 CAPSULE BY MOUTH EVERY NIGHT AT BEDTIME   hydrOXYzine (VISTARIL) 25 MG capsule  Take 1 capsule (25 mg total) by mouth at bedtime as needed.   ketotifen (ZADITOR) 0.025 % ophthalmic solution Place 1 drop into both eyes 2 (two) times daily as needed (itching).   latanoprost (XALATAN) 0.005 % ophthalmic solution 1 drop at bedtime.   prednisoLONE acetate (PRED FORTE) 1 % ophthalmic suspension Place 1 drop into the left eye 4 (four) times daily.   rosuvastatin (CRESTOR) 20 MG tablet TAKE 1 TABLET(20 MG) BY MOUTH DAILY   sitaGLIPtin (JANUVIA) 50 MG tablet TAKE 1 TABLET(50 MG) BY MOUTH DAILY   cycloSPORINE  (RESTASIS) 0.05 % ophthalmic emulsion Place 1 drop into both eyes 2 (two) times daily. (Patient not taking: Reported on 11/30/2022)   HYDROcodone-acetaminophen (NORCO) 5-325 MG tablet Take 1-2 tablets by mouth every 6 (six) hours as needed for moderate pain. (Patient not taking: Reported on 05/31/2023)   meloxicam (MOBIC) 7.5 MG tablet Take 7.5 mg by mouth daily. (Patient not taking: Reported on 05/31/2023)   Omega-3 Fatty Acids (FISH OIL) 1000 MG CAPS Take 1,000 mg by mouth daily. (Patient not taking: Reported on 05/31/2023)   tiZANidine (ZANAFLEX) 2 MG tablet Take 1 tablet (2 mg total) by mouth every 12 (twelve) hours. (Patient not taking: Reported on 05/31/2023)   valsartan (DIOVAN) 40 MG tablet Take 1 tablet (40 mg total) by mouth daily. (Patient not taking: Reported on 05/31/2023)   No facility-administered encounter medications on file as of 08/30/2023.    Review of Systems  Review of Systems  Constitutional: Negative.  Negative for fever.  HENT: Negative.    Respiratory:  Positive for cough.   Cardiovascular: Negative.   Gastrointestinal: Negative.   Allergic/Immunologic: Negative.   Neurological: Negative.  Negative for headaches.  Psychiatric/Behavioral: Negative.       Objective:   BP (!) 148/65   Pulse 80   Temp (!) 97.1 F (36.2 C)   Resp 18   Ht 5\' 4"  (1.626 m)   Wt 155 lb (70.3 kg)   SpO2 96%   BMI 26.61 kg/m   Wt Readings from Last 5 Encounters:  08/30/23 155 lb (70.3 kg)  07/12/23 160 lb (72.6 kg)  05/31/23 160 lb 12.8 oz (72.9 kg)  01/24/23 153 lb (69.4 kg)  01/11/23 155 lb 10.3 oz (70.6 kg)     Physical Exam Vitals and nursing note reviewed.  Constitutional:      General: She is not in acute distress.    Appearance: She is well-developed.  Cardiovascular:     Rate and Rhythm: Normal rate and regular rhythm.  Pulmonary:     Effort: Pulmonary effort is normal.     Breath sounds: Normal breath sounds.  Neurological:     Mental Status: She is alert and  oriented to person, place, and time.       Assessment & Plan:   Cough, unspecified type -     POCT Influenza A/B -     POC COVID-19 BinaxNow  Seasonal allergies -     Cetirizine HCl; Take 1 tablet (10 mg total) by mouth daily.  Dispense: 30 tablet; Refill: 11     Return if symptoms worsen or fail to improve.   Ivonne Andrew, NP 08/30/2023

## 2023-08-30 NOTE — Patient Instructions (Addendum)
1. Cough, unspecified type  - Influenza A/B - POC COVID-19  2. Seasonal allergies  - cetirizine (ZYRTEC) 10 MG tablet; Take 1 tablet (10 mg total) by mouth daily.  Dispense: 30 tablet; Refill: 11  Follow up:  Follow up as needed

## 2023-09-13 ENCOUNTER — Encounter: Payer: Self-pay | Admitting: Nurse Practitioner

## 2023-09-13 ENCOUNTER — Ambulatory Visit (INDEPENDENT_AMBULATORY_CARE_PROVIDER_SITE_OTHER): Payer: 59 | Admitting: Nurse Practitioner

## 2023-09-13 VITALS — BP 145/56 | HR 71 | Temp 98.6°F | Ht 59.0 in | Wt 154.0 lb

## 2023-09-13 DIAGNOSIS — E119 Type 2 diabetes mellitus without complications: Secondary | ICD-10-CM | POA: Diagnosis not present

## 2023-09-13 DIAGNOSIS — H9201 Otalgia, right ear: Secondary | ICD-10-CM

## 2023-09-13 DIAGNOSIS — H9191 Unspecified hearing loss, right ear: Secondary | ICD-10-CM | POA: Diagnosis not present

## 2023-09-13 LAB — POCT GLYCOSYLATED HEMOGLOBIN (HGB A1C): HbA1c, POC (controlled diabetic range): 6.2 % (ref 0.0–7.0)

## 2023-09-13 MED ORDER — OFLOXACIN 0.3 % OT SOLN: 5.0000 [drp] | Freq: Every day | OTIC | 0 refills | Status: DC

## 2023-09-13 NOTE — Progress Notes (Unsigned)
Patient states she can't hear out of right ear.

## 2023-09-13 NOTE — Progress Notes (Unsigned)
Subjective   Patient ID: Beth Hubbard, female    DOB: 09/25/1946, 77 y.o.   MRN: [id number]  Chief Complaint  Patient presents with   Medical Management of Chronic Issues    Referring provider: Ivonne Andrew, NP  Beth Hubbard is a 77 y.o. female with Past Medical History: No date: Arthritis No date: Diabetes mellitus without complication (HCC) No date: Excessive daytime sleepiness No date: Glaucoma No date: High cholesterol No date: Hyperlipemia No date: Hypertension No date: Mitral regurgitation     Comment:  mild by echo 03/2017 10/18/2015: OSA (obstructive sleep apnea)     Comment:  Mild OSA with AHI 12.5/hr on CPAP at 8cm H2O No date: Pneumonia No date: Shingles No date: TIA (transient ischemic attack)   HPI  Patient presents today for an acute visit.  She states that she is having issues hearing out of her right ear.  She has been having some right ear pain.  Upon exam she does not have impacted cerumen.  We will order eardrops and place referral for ENT. Denies f/c/s, n/v/d, hemoptysis, PND, leg swelling Denies chest pain or edema    Allergies  Allergen Reactions   Aspirin     Tremor    Aspirin Other (See Comments)    Ringing in Ear   Ibuprofen Other (See Comments)    Ringing in Ear   Penicillins Itching and Other (See Comments)    Reaction: itching,headache, dizziness and anxiety. Feels like she is "goin off" Did it involve swelling of the face/tongue/throat, SOB, or low BP? No Did it involve sudden or severe rash/hives, skin peeling, or any reaction on the inside of your mouth or nose? No Did you need to seek medical attention at a hospital or doctor's office? No When did it last happen?      Childhood If all above answers are "NO", may proceed with cephalosporin use.   Penicillins Other (See Comments)    Patient fainted   Nsaids Anxiety    Reaction: causes dizziness and headache. Ibuprofen. Aspirin    Immunization History  Administered  Date(s) Administered   Influenza, High Dose Seasonal PF 09/23/2018   Influenza,inj,Quad PF,6+ Mos 09/23/2020   Influenza-Unspecified 06/19/2013   PFIZER(Purple Top)SARS-COV-2 Vaccination 04/28/2020, 05/27/2020   Pneumococcal Conjugate-13 02/24/2015   Pneumococcal Polysaccharide-23 03/05/2016   Tdap 02/24/2015    Tobacco History: Social History   Tobacco Use  Smoking Status Never  Smokeless Tobacco Never   Counseling given: Not Answered   Outpatient Encounter Medications as of 09/13/2023  Medication Sig   acetaminophen (TYLENOL) 500 MG tablet Take 500 mg by mouth every 6 (six) hours as needed.   amLODipine (NORVASC) 10 MG tablet Take 1 tablet (10 mg total) by mouth daily.   Blood Pressure Monitor KIT 1 kit by Does not apply route daily.   brimonidine-timolol (COMBIGAN) 0.2-0.5 % ophthalmic solution Place 1 drop into both eyes 2 (two) times daily.   cetirizine (ZYRTEC) 10 MG tablet Take 1 tablet (10 mg total) by mouth daily.   dorzolamide (TRUSOPT) 2 % ophthalmic solution Place 1 drop into both eyes 2 (two) times daily.   gabapentin (NEURONTIN) 300 MG capsule TAKE 1 CAPSULE BY MOUTH EVERY NIGHT AT BEDTIME   hydrOXYzine (VISTARIL) 25 MG capsule Take 1 capsule (25 mg total) by mouth at bedtime as needed.   ketotifen (ZADITOR) 0.025 % ophthalmic solution Place 1 drop into both eyes 2 (two) times daily as needed (itching).   latanoprost (XALATAN) 0.005 %  ophthalmic solution 1 drop at bedtime.   ofloxacin (FLOXIN) 0.3 % OTIC solution Place 5 drops into the right ear daily.   prednisoLONE acetate (PRED FORTE) 1 % ophthalmic suspension Place 1 drop into the left eye 4 (four) times daily.   rosuvastatin (CRESTOR) 20 MG tablet TAKE 1 TABLET(20 MG) BY MOUTH DAILY   sitaGLIPtin (JANUVIA) 50 MG tablet TAKE 1 TABLET(50 MG) BY MOUTH DAILY   ACCU-CHEK AVIVA PLUS test strip Reported on 02/24/2016 (Patient not taking: Reported on 09/13/2023)   Accu-Chek Softclix Lancets lancets Check BS once or twice  a day. (Patient not taking: Reported on 09/13/2023)   blood glucose meter kit and supplies KIT Dispense based on patient and insurance preference. Use up to four times daily as directed. (Patient not taking: Reported on 09/13/2023)   cycloSPORINE (RESTASIS) 0.05 % ophthalmic emulsion Place 1 drop into both eyes 2 (two) times daily. (Patient not taking: Reported on 11/30/2022)   HYDROcodone-acetaminophen (NORCO) 5-325 MG tablet Take 1-2 tablets by mouth every 6 (six) hours as needed for moderate pain. (Patient not taking: Reported on 05/31/2023)   meloxicam (MOBIC) 7.5 MG tablet Take 7.5 mg by mouth daily. (Patient not taking: Reported on 05/31/2023)   Omega-3 Fatty Acids (FISH OIL) 1000 MG CAPS Take 1,000 mg by mouth daily. (Patient not taking: Reported on 05/31/2023)   tiZANidine (ZANAFLEX) 2 MG tablet Take 1 tablet (2 mg total) by mouth every 12 (twelve) hours. (Patient not taking: Reported on 05/31/2023)   valsartan (DIOVAN) 40 MG tablet Take 1 tablet (40 mg total) by mouth daily. (Patient not taking: Reported on 05/31/2023)   No facility-administered encounter medications on file as of 09/13/2023.    Review of Systems  Review of Systems  Constitutional: Negative.   HENT:  Positive for ear pain.        Decreased hearing right ear  Cardiovascular: Negative.   Gastrointestinal: Negative.   Allergic/Immunologic: Negative.   Neurological: Negative.   Psychiatric/Behavioral: Negative.       Objective:   BP (!) 145/56   Pulse 71   Temp 98.6 F (37 C) (Oral)   Ht 4\' 11"  (1.499 m)   Wt 154 lb (69.9 kg)   SpO2 98%   BMI 31.10 kg/m   Wt Readings from Last 5 Encounters:  09/13/23 154 lb (69.9 kg)  08/30/23 155 lb (70.3 kg)  07/12/23 160 lb (72.6 kg)  05/31/23 160 lb 12.8 oz (72.9 kg)  01/24/23 153 lb (69.4 kg)     Physical Exam Vitals and nursing note reviewed.  Constitutional:      General: She is not in acute distress.    Appearance: She is well-developed.  Cardiovascular:      Rate and Rhythm: Normal rate and regular rhythm.  Pulmonary:     Effort: Pulmonary effort is normal.     Breath sounds: Normal breath sounds.  Neurological:     Mental Status: She is alert and oriented to person, place, and time.       Assessment & Plan:   Diabetes mellitus without complication (HCC) -     POCT glycosylated hemoglobin (Hb A1C)  Right ear pain -     Ambulatory referral to ENT -     Ofloxacin; Place 5 drops into the right ear daily.  Dispense: 5 mL; Refill: 0  Acute hearing loss of right ear -     Ambulatory referral to ENT     Return in about 3 months (around 12/14/2023).   Gildardo Pounds  Tanda Rockers, NP 09/16/2023

## 2023-09-16 ENCOUNTER — Encounter: Payer: Self-pay | Admitting: Nurse Practitioner

## 2023-09-16 NOTE — Patient Instructions (Addendum)
1. Diabetes mellitus without complication (HCC)  - POCT glycosylated hemoglobin (Hb A1C)   2. Right ear pain  - Ambulatory referral to ENT - ofloxacin (FLOXIN) 0.3 % OTIC solution; Place 5 drops into the right ear daily.  Dispense: 5 mL; Refill: 0   3. Acute hearing loss of right ear  - Ambulatory referral to ENT   Follow up:  Follow up in 3 months

## 2023-09-20 ENCOUNTER — Encounter (INDEPENDENT_AMBULATORY_CARE_PROVIDER_SITE_OTHER): Payer: Self-pay | Admitting: Otolaryngology

## 2023-10-03 ENCOUNTER — Other Ambulatory Visit: Payer: Self-pay | Admitting: *Deleted

## 2023-10-03 MED ORDER — TRAZODONE HCL 100 MG PO TABS: 100.0000 mg | ORAL_TABLET | Freq: Every day | ORAL | 2 refills | Status: DC

## 2023-10-16 ENCOUNTER — Telehealth: Payer: Self-pay | Admitting: Nurse Practitioner

## 2023-10-16 ENCOUNTER — Other Ambulatory Visit (HOSPITAL_COMMUNITY): Payer: Self-pay | Admitting: Nurse Practitioner

## 2023-10-16 DIAGNOSIS — E785 Hyperlipidemia, unspecified: Secondary | ICD-10-CM

## 2023-10-16 MED ORDER — ROSUVASTATIN CALCIUM 20 MG PO TABS: ORAL_TABLET | ORAL | 2 refills | Status: DC

## 2023-10-16 NOTE — Telephone Encounter (Signed)
Copied from CRM [id number]. Topic: Clinical - Medication Refill >> Oct 16, 2023 12:12 PM Almira Coaster wrote: Most Recent Primary Care Visit:  Provider: Ivonne Andrew  Department: [facility]  Visit Type: OFFICE VISIT  Date: 09/13/2023  Medication: rosuvastatin (CRESTOR) 20 MG tablet   Has the patient contacted their pharmacy? Yes (Agent: If no, request that the patient contact the pharmacy for the refill. If patient does not wish to contact the pharmacy document the reason why and proceed with request.) (Agent: If yes, when and what did the pharmacy advise?)  Is this the correct pharmacy for this prescription? Yes If no, delete pharmacy and type the correct one.  This is the patient's preferred pharmacy:    [facility] [phone] - [place], [place] - [address] AT [facility] [address] [place] [address] Phone: [phone] Fax: [phone]    Has the prescription been filled recently? No  Is the patient out of the medication? Yes  Has the patient been seen for an appointment in the last year OR does the patient have an upcoming appointment? Yes  Can we respond through MyChart? No, prefers a phone call.  Agent: Please be advised that Rx refills may take up to 3 business days. We ask that you follow-up with your pharmacy.

## 2023-10-29 ENCOUNTER — Institutional Professional Consult (permissible substitution) (INDEPENDENT_AMBULATORY_CARE_PROVIDER_SITE_OTHER): Payer: 59

## 2023-10-29 ENCOUNTER — Ambulatory Visit (INDEPENDENT_AMBULATORY_CARE_PROVIDER_SITE_OTHER): Payer: 59 | Admitting: Audiology

## 2023-12-06 ENCOUNTER — Ambulatory Visit: Payer: Self-pay | Admitting: Nurse Practitioner

## 2023-12-06 ENCOUNTER — Other Ambulatory Visit: Payer: Self-pay

## 2023-12-06 DIAGNOSIS — E1129 Type 2 diabetes mellitus with other diabetic kidney complication: Secondary | ICD-10-CM

## 2023-12-06 MED ORDER — SITAGLIPTIN PHOSPHATE 50 MG PO TABS: ORAL_TABLET | ORAL | 0 refills | Status: DC

## 2023-12-06 MED ORDER — AMLODIPINE BESYLATE 10 MG PO TABS: 10.0000 mg | ORAL_TABLET | Freq: Every day | ORAL | 3 refills | Status: DC

## 2023-12-13 ENCOUNTER — Ambulatory Visit (INDEPENDENT_AMBULATORY_CARE_PROVIDER_SITE_OTHER): Payer: 59 | Admitting: Nurse Practitioner

## 2023-12-13 ENCOUNTER — Encounter: Payer: Self-pay | Admitting: Nurse Practitioner

## 2023-12-13 VITALS — BP 128/57 | HR 74 | Temp 97.1°F | Wt 153.0 lb

## 2023-12-13 DIAGNOSIS — I10 Essential (primary) hypertension: Secondary | ICD-10-CM

## 2023-12-13 DIAGNOSIS — R350 Frequency of micturition: Secondary | ICD-10-CM | POA: Diagnosis not present

## 2023-12-13 DIAGNOSIS — R809 Proteinuria, unspecified: Secondary | ICD-10-CM | POA: Diagnosis not present

## 2023-12-13 DIAGNOSIS — E785 Hyperlipidemia, unspecified: Secondary | ICD-10-CM | POA: Diagnosis not present

## 2023-12-13 DIAGNOSIS — E1129 Type 2 diabetes mellitus with other diabetic kidney complication: Secondary | ICD-10-CM | POA: Diagnosis not present

## 2023-12-13 LAB — POCT URINALYSIS DIP (CLINITEK)
Bilirubin, UA: NEGATIVE
Blood, UA: NEGATIVE
Glucose, UA: NEGATIVE mg/dL
Ketones, POC UA: NEGATIVE mg/dL
Leukocytes, UA: NEGATIVE
Nitrite, UA: NEGATIVE
POC PROTEIN,UA: NEGATIVE
Spec Grav, UA: 1.02 (ref 1.010–1.025)
Urobilinogen, UA: 0.2 U/dL
pH, UA: 7 (ref 5.0–8.0)

## 2023-12-13 LAB — POCT GLYCOSYLATED HEMOGLOBIN (HGB A1C): Hemoglobin A1C: 5.9 % — AB (ref 4.0–5.6)

## 2023-12-13 MED ORDER — VALSARTAN 80 MG PO TABS: 80.0000 mg | ORAL_TABLET | Freq: Every day | ORAL | 3 refills | Status: DC

## 2023-12-13 NOTE — Progress Notes (Signed)
Subjective   Patient ID: Beth Hubbard, female    DOB: [birth year], 78 y.o.   MRN: [id number]  Chief Complaint  Patient presents with   Diabetes    Referring provider: Ivonne Andrew, NP  Beth Hubbard is a 78 y.o. female with Past Medical History: No date: Arthritis No date: Diabetes mellitus without complication (HCC) No date: Excessive daytime sleepiness No date: Glaucoma No date: High cholesterol No date: Hyperlipemia No date: Hypertension No date: Mitral regurgitation     Comment:  mild by echo 03/2017 10/18/2015: OSA (obstructive sleep apnea)     Comment:  Mild OSA with AHI 12.5/hr on CPAP at 8cm H2O No date: Pneumonia No date: Shingles No date: TIA (transient ischemic attack)  HPI  Beth Hubbard is a 78 y.o. female who presents for follow-up of Type 2 diabetes mellitus.   Patient is not checking home blood sugars.   Home blood sugar records:  How often is blood sugars being checked: not checking Current symptoms/problems include none and have been stable. Last eye exam: within the past year Exercise: The patient does not participate in regular exercise at present.  A1C today is 5.9   Denies f/c/s, n/v/d, hemoptysis, PND, leg swelling Denies chest pain or edema  Left neck pain associated with elevated blood pressure and heart rate. Patient is followed by cardiology. She has been taking extra blood pressure pills. Will increase dosage of valsartan.    Allergies  Allergen Reactions   Aspirin     Tremor    Aspirin Other (See Comments)    Ringing in Ear   Ibuprofen Other (See Comments)    Ringing in Ear   Penicillins Itching and Other (See Comments)    Reaction: itching,headache, dizziness and anxiety. Feels like she is "goin off" Did it involve swelling of the face/tongue/throat, SOB, or low BP? No Did it involve sudden or severe rash/hives, skin peeling, or any reaction on the inside of your mouth or nose? No Did you need to seek medical attention at  a hospital or doctor's office? No When did it last happen?      Childhood If all above answers are "NO", may proceed with cephalosporin use.   Penicillins Other (See Comments)    Patient fainted   Nsaids Anxiety    Reaction: causes dizziness and headache. Ibuprofen. Aspirin    Immunization History  Administered Date(s) Administered   Influenza, High Dose Seasonal PF 09/23/2018   Influenza,inj,Quad PF,6+ Mos 09/23/2020   Influenza-Unspecified 06/19/2013   PFIZER(Purple Top)SARS-COV-2 Vaccination 04/28/2020, 05/27/2020   Pneumococcal Conjugate-13 02/24/2015   Pneumococcal Polysaccharide-23 03/05/2016   Tdap 02/24/2015    Tobacco History: Social History   Tobacco Use  Smoking Status Never  Smokeless Tobacco Never   Counseling given: Not Answered   Outpatient Encounter Medications as of 12/13/2023  Medication Sig   acetaminophen (TYLENOL) 500 MG tablet Take 500 mg by mouth every 6 (six) hours as needed.   amLODipine (NORVASC) 10 MG tablet Take 1 tablet (10 mg total) by mouth daily.   brimonidine-timolol (COMBIGAN) 0.2-0.5 % ophthalmic solution Place 1 drop into both eyes 2 (two) times daily.   cetirizine (ZYRTEC) 10 MG tablet Take 1 tablet (10 mg total) by mouth daily.   dorzolamide (TRUSOPT) 2 % ophthalmic solution Place 1 drop into both eyes 2 (two) times daily.   Doxylamine Succinate, Sleep, (SLEEP AID PO) Take by mouth.   gabapentin (NEURONTIN) 300 MG capsule TAKE 1 CAPSULE BY MOUTH EVERY NIGHT  AT BEDTIME   hydrOXYzine (VISTARIL) 25 MG capsule Take 1 capsule (25 mg total) by mouth at bedtime as needed.   rosuvastatin (CRESTOR) 20 MG tablet TAKE 1 TABLET(20 MG) BY MOUTH DAILY   sitaGLIPtin (JANUVIA) 50 MG tablet TAKE 1 TABLET(50 MG) BY MOUTH DAILY   traZODone (DESYREL) 100 MG tablet Take 1 tablet (100 mg total) by mouth at bedtime.   valsartan (DIOVAN) 80 MG tablet Take 1 tablet (80 mg total) by mouth daily.   [DISCONTINUED] valsartan (DIOVAN) 40 MG tablet Take 1 tablet (40  mg total) by mouth daily.   ACCU-CHEK AVIVA PLUS test strip Reported on 02/24/2016 (Patient not taking: Reported on 09/13/2023)   Accu-Chek Softclix Lancets lancets Check BS once or twice a day. (Patient not taking: Reported on 09/13/2023)   blood glucose meter kit and supplies KIT Dispense based on patient and insurance preference. Use up to four times daily as directed. (Patient not taking: Reported on 12/13/2023)   Blood Pressure Monitor KIT 1 kit by Does not apply route daily. (Patient not taking: Reported on 12/13/2023)   cycloSPORINE (RESTASIS) 0.05 % ophthalmic emulsion Place 1 drop into both eyes 2 (two) times daily. (Patient not taking: Reported on 11/30/2022)   HYDROcodone-acetaminophen (NORCO) 5-325 MG tablet Take 1-2 tablets by mouth every 6 (six) hours as needed for moderate pain. (Patient not taking: Reported on 12/13/2023)   ketotifen (ZADITOR) 0.025 % ophthalmic solution Place 1 drop into both eyes 2 (two) times daily as needed (itching). (Patient not taking: Reported on 12/13/2023)   latanoprost (XALATAN) 0.005 % ophthalmic solution 1 drop at bedtime. (Patient not taking: Reported on 12/13/2023)   meloxicam (MOBIC) 7.5 MG tablet Take 7.5 mg by mouth daily. (Patient not taking: Reported on 12/13/2023)   ofloxacin (FLOXIN) 0.3 % OTIC solution Place 5 drops into the right ear daily. (Patient not taking: Reported on 12/13/2023)   Omega-3 Fatty Acids (FISH OIL) 1000 MG CAPS Take 1,000 mg by mouth daily. (Patient not taking: Reported on 05/31/2023)   prednisoLONE acetate (PRED FORTE) 1 % ophthalmic suspension Place 1 drop into the left eye 4 (four) times daily. (Patient not taking: Reported on 12/13/2023)   tiZANidine (ZANAFLEX) 2 MG tablet Take 1 tablet (2 mg total) by mouth every 12 (twelve) hours. (Patient not taking: Reported on 12/13/2023)   No facility-administered encounter medications on file as of 12/13/2023.    Review of Systems  Review of Systems  Constitutional: Negative.   HENT:  Negative.    Cardiovascular: Negative.   Gastrointestinal: Negative.   Allergic/Immunologic: Negative.   Neurological: Negative.   Psychiatric/Behavioral: Negative.       Objective:   BP (!) 128/57   Pulse 74   Temp (!) 97.1 F (36.2 C)   Wt 153 lb (69.4 kg)   SpO2 97%   BMI 30.90 kg/m   Wt Readings from Last 5 Encounters:  12/13/23 153 lb (69.4 kg)  09/13/23 154 lb (69.9 kg)  08/30/23 155 lb (70.3 kg)  07/12/23 160 lb (72.6 kg)  05/31/23 160 lb 12.8 oz (72.9 kg)     Physical Exam Vitals and nursing note reviewed.  Constitutional:      General: She is not in acute distress.    Appearance: She is well-developed.  Cardiovascular:     Rate and Rhythm: Normal rate and regular rhythm.  Pulmonary:     Effort: Pulmonary effort is normal.     Breath sounds: Normal breath sounds.  Neurological:     Mental Status:  She is alert and oriented to person, place, and time.       Assessment & Plan:   Controlled type 2 diabetes mellitus with microalbuminuria, without long-term current use of insulin (HCC) -     POCT glycosylated hemoglobin (Hb A1C) -     POCT glycosylated hemoglobin (Hb A1C) -     Microalbumin / creatinine urine ratio -     CBC -     Comprehensive metabolic panel  Hypertension, unspecified type -     Valsartan; Take 1 tablet (80 mg total) by mouth daily.  Dispense: 90 tablet; Refill: 3 -     CBC -     Comprehensive metabolic panel  Hyperlipidemia, unspecified hyperlipidemia type -     Lipid panel     Return in about 3 months (around 03/12/2024).   Ivonne Andrew, NP 12/13/2023

## 2023-12-14 LAB — COMPREHENSIVE METABOLIC PANEL
ALT: 9 [IU]/L (ref 0–32)
AST: 13 [IU]/L (ref 0–40)
Albumin: 4.3 g/dL (ref 3.8–4.8)
Alkaline Phosphatase: 60 [IU]/L (ref 44–121)
BUN/Creatinine Ratio: 13 (ref 12–28)
BUN: 11 mg/dL (ref 8–27)
Bilirubin Total: 0.2 mg/dL (ref 0.0–1.2)
CO2: 27 mmol/L (ref 20–29)
Calcium: 9.6 mg/dL (ref 8.7–10.3)
Chloride: 104 mmol/L (ref 96–106)
Creatinine, Ser: 0.83 mg/dL (ref 0.57–1.00)
Globulin, Total: 2.6 g/dL (ref 1.5–4.5)
Glucose: 95 mg/dL (ref 70–99)
Potassium: 4.4 mmol/L (ref 3.5–5.2)
Sodium: 145 mmol/L — ABNORMAL HIGH (ref 134–144)
Total Protein: 6.9 g/dL (ref 6.0–8.5)
eGFR: 72 mL/min/{1.73_m2} (ref >59)

## 2023-12-14 LAB — LIPID PANEL
Chol/HDL Ratio: 3.3 {ratio} (ref 0.0–4.4)
Cholesterol, Total: 174 mg/dL (ref 100–199)
HDL: 53 mg/dL (ref >39)
LDL Chol Calc (NIH): 98 mg/dL (ref 0–99)
Triglycerides: 128 mg/dL (ref 0–149)
VLDL Cholesterol Cal: 23 mg/dL (ref 5–40)

## 2023-12-14 LAB — CBC
Hematocrit: 34.6 % (ref 34.0–46.6)
Hemoglobin: 11.3 g/dL (ref 11.1–15.9)
MCH: 31.4 pg (ref 26.6–33.0)
MCHC: 32.7 g/dL (ref 31.5–35.7)
MCV: 96 fL (ref 79–97)
Platelets: 239 10*3/uL (ref 150–450)
RBC: 3.6 x10E6/uL — ABNORMAL LOW (ref 3.77–5.28)
RDW: 13.3 % (ref 11.7–15.4)
WBC: 6.5 10*3/uL (ref 3.4–10.8)

## 2023-12-14 LAB — MICROALBUMIN / CREATININE URINE RATIO
Creatinine, Urine: 63.2 mg/dL
Microalb/Creat Ratio: 12 mg/g{creat} (ref 0–29)
Microalbumin, Urine: 7.3 ug/mL

## 2023-12-18 ENCOUNTER — Telehealth: Payer: Self-pay | Admitting: Cardiology

## 2023-12-18 NOTE — Telephone Encounter (Signed)
Originally spoke to patient's daughter who stated pain went away on 01/09 - but patient returned call and states pain and symptoms stopped 3 days ago.

## 2023-12-18 NOTE — Telephone Encounter (Signed)
Pt c/o of Chest Pain: STAT if CP now or developed within 24 hours  1. Are you having CP right now? No   2. Are you experiencing any other symptoms (ex. SOB, nausea, vomiting, sweating)? Some SOB   3. How long have you been experiencing CP? Last experienced on 01/09  4. Is your CP continuous or coming and going? Coming and going   5. Have you taken Nitroglycerin? No   Patient was scheduled with Dr. Mayford Knife previously for symptoms. Pt had to be rescheduled due to provider schedule change. Pt wants to only see Dr. Mayford Knife. Rescheduled to Dr. Mayford Knife on 04/02 and placed on wait list as urgent.  Please advise if patient should be seen sooner, or if there is advisement for these previous issues.  ?

## 2023-12-18 NOTE — Telephone Encounter (Signed)
Left message to call back

## 2023-12-19 NOTE — Telephone Encounter (Signed)
Spoke with patient and dtr Marily Memos (DPR). They state patient has been having intermittent left chest pain for several days. They deny SOB, but state that patient is having chest pain both on exertion and at rest. Patient agrees to see PA Tessa on 12/24/23 if she can see Dr. Mayford Knife in person at another time. Appt made with Dr. Mayford Knife for 01/30/24. Discussed to go to ED if chest pain worsens or is accompanied by SOB.

## 2023-12-19 NOTE — Telephone Encounter (Signed)
Follow Up:      Patient is returning a call from yesterday.

## 2023-12-19 NOTE — Telephone Encounter (Signed)
Returned patient's call, no answer. Left detailed message per DPR explaining that I have made her an appt for 12/27/23 with Dr. Mayford Knife. Advised that she needs to call our office and let us know if she can make that appointment, also explained that we can potentially get her in sooner if she is willing to see one of Dr. Norris Cross colleagues. Asked patient to call back so her symptoms can be evaluated.

## 2023-12-20 ENCOUNTER — Ambulatory Visit: Payer: 59 | Admitting: Podiatry

## 2023-12-20 ENCOUNTER — Ambulatory Visit: Payer: 59 | Admitting: Cardiology

## 2023-12-23 NOTE — Progress Notes (Signed)
 Cardiology Office Note:  .   Date:  12/24/2023  ID:  Terrionna, Bridwell 02/23/1946, MRN [id number] PCP: Oley Bascom RAMAN, NP  [facility] Providers Cardiologist:  Wilbert Bihari, MD {  History of Present Illness: SABRA   OLIVE ZMUDA is a 78 y.o. female with a past medical history of mild OSA, diabetes mellitus, hypertension, hyperlipidemia, mitral regurgitation, and history of TIA here for follow-up appointment  She was last seen for her sleep apnea back in September 2023.  Saw Dr. Bihari.  Unfortunately fell the previous April and broke some bones in her face and had to have 7 hours of reconstructive surgery.  Was not able to tolerate her CPAP mask at that time.  Hopefully, able to start using it again but still has some problems with her iron pain around that area.  Today, she presents with a history of hypertension, hyperlipidemia, diabetes, and a previous stroke, with intermittent sharp pain under the left breast. The pain, which sometimes occurs with activity, resolves quickly with rest. She has not experienced any associated shortness of breath or leg swelling. She also reports recent facial and leg injuries requiring surgical intervention, which have since healed. She has been experiencing difficulty tolerating her glasses since the facial injury. She has resumed using her CPAP machine after a period of non-use due to the facial injury. She reports waking up gasping for air without the CPAP machine. She has discontinued muscle relaxants and hydrocodone  due to side effects.She plans to get back on her CPAP machine.  Reports no shortness of breath nor dyspnea on exertion.  No edema, orthopnea, PND. Reports no palpitations.   Discussed the use of AI scribe software for clinical note transcription with the patient, who gave verbal consent to proceed.  ROS: Pertinent ROS in HPI  Studies Reviewed: SABRA   EKG Interpretation Date/Time:  Tuesday December 24 2023 08:23:59 EST Ventricular Rate:   66 PR Interval:  172 QRS Duration:  86 QT Interval:  378 QTC Calculation: 396 R Axis:   54  Text Interpretation: Normal sinus rhythm Normal ECG When compared with ECG of 04-Mar-2022 00:36, PREVIOUS ECG IS PRESENT Confirmed by Lucien Blanc [phone]) on 12/24/2023 8:38:09 AM   Echo 2018 Study Conclusions   - Left ventricle: The cavity size was normal. Wall thickness was    increased in a pattern of moderate LVH. Systolic function was    normal. The estimated ejection fraction was in the range of 60%    to 65%. Wall motion was normal; there were no regional wall    motion abnormalities. Doppler parameters are consistent with    abnormal left ventricular relaxation (grade 1 diastolic    dysfunction).  - Aortic valve: Mildly to moderately calcified annulus.  - Mitral valve: There was mild regurgitation.       STOP-Bang Score:         Physical Exam:   VS:  BP 136/70   Pulse 66   Ht 4' 11 (1.499 m)   Wt 151 lb (68.5 kg)   SpO2 98%   BMI 30.50 kg/m    Wt Readings from Last 3 Encounters:  12/24/23 151 lb (68.5 kg)  12/13/23 153 lb (69.4 kg)  09/13/23 154 lb (69.9 kg)    GEN: Well nourished, well developed in no acute distress NECK: No JVD; No carotid bruits CARDIAC: RRR, no murmurs, rubs, gallops RESPIRATORY:  Clear to auscultation without rales, wheezing or rhonchi  ABDOMEN: Soft, non-tender, non-distended EXTREMITIES:  No edema; No deformity   ASSESSMENT AND PLAN: .    Hypertension -BP well controlled today -continue current medications with Norvasc  10mg  daily and Diovan  80mg  daily -continue low sodium, heart healthy diet  Facial and Leg Trauma Recovering from recent accidents with facial and leg injuries requiring surgeries. No current complaints related to these injuries. -Continue current management and follow-up with relevant specialists as needed.  Obstructive Sleep Apnea Difficulty using CPAP due to facial trauma. Reports waking up gasping for air. -Encouraged to  use CPAP when possible and to contact the office for assistance if needed.  Chest Pain Reports of intermittent sharp pain under left breast, not clearly associated with exertion. EKG normal. Likely musculoskeletal or nerve-related given the characteristics of the pain. -Advised to avoid heavy lifting and to monitor symptoms.  Hyperlipidemia LDL slightly above goal for stroke prevention (98 mg/dL). Currently on Crestor  20mg  in the morning. -Change Crestor  20mg  to nighttime dosing for better absorption and potential improvement in LDL control.  Medication Management Discontinued muscle relaxer and hydrocodone  due to side effects. Currently on Januvia  for diabetes, Valium for blood pressure, Tylenol  for pain, and gabapentin  at night. -Continue current medications. No refills needed at this time.  Follow-up No acute cardiac concerns at this time. Blood pressure and heart rate well controlled on current medications. -Schedule follow-up appointment with Dr. Shlomo in 1 year.      Signed, Orren LOISE Fabry, PA-C

## 2023-12-24 ENCOUNTER — Encounter: Payer: Self-pay | Admitting: Physician Assistant

## 2023-12-24 ENCOUNTER — Ambulatory Visit: Payer: 59 | Attending: Physician Assistant | Admitting: Physician Assistant

## 2023-12-24 VITALS — BP 136/70 | HR 66 | Ht 59.0 in | Wt 151.0 lb

## 2023-12-24 DIAGNOSIS — G4733 Obstructive sleep apnea (adult) (pediatric): Secondary | ICD-10-CM | POA: Diagnosis not present

## 2023-12-24 DIAGNOSIS — I10 Essential (primary) hypertension: Secondary | ICD-10-CM

## 2023-12-24 DIAGNOSIS — E785 Hyperlipidemia, unspecified: Secondary | ICD-10-CM | POA: Diagnosis not present

## 2023-12-24 MED ORDER — VALSARTAN 80 MG PO TABS: 80.0000 mg | ORAL_TABLET | Freq: Every day | ORAL | 3 refills | Status: DC

## 2023-12-24 MED ORDER — ROSUVASTATIN CALCIUM 20 MG PO TABS: ORAL_TABLET | ORAL | 3 refills | Status: DC

## 2023-12-24 NOTE — Patient Instructions (Signed)
Medication Instructions:  Your physician recommends that you continue on your current medications as directed. Please refer to the Current Medication list given to you today.  *If you need a refill on your cardiac medications before your next appointment, please call your pharmacy*  Lab Work: None ordered today.  Testing/Procedures: None ordered today.  Follow-Up: At [facility], you and your health needs are our priority.  As part of our continuing mission to provide you with exceptional heart care, we have created designated Provider Care Teams.  These Care Teams include your primary Cardiologist (physician) and Advanced Practice Providers (APPs -  Physician Assistants and Nurse Practitioners) who all work together to provide you with the care you need, when you need it.  Your next appointment:   1 year(s)  Provider:   Armanda Magic, MD   Other Instructions         1st Floor: - Lobby - Registration  - Pharmacy  - Lab - Cafe  2nd Floor: - PV Lab - Diagnostic Testing (echo, CT, nuclear med)  3rd Floor: - Vacant  4th Floor: - TCTS (cardiothoracic surgery) - AFib Clinic - Structural Heart Clinic - Vascular Surgery  - Vascular Ultrasound  5th Floor: - [facility] Card[facility] (general and EP) - Clinical Pharmacy for coumadin, hypertension, lipid, weight-loss medications, and med management appointments    Valet parking services will be available as well.

## 2023-12-25 ENCOUNTER — Other Ambulatory Visit: Payer: Self-pay | Admitting: Nurse Practitioner

## 2023-12-25 DIAGNOSIS — M542 Cervicalgia: Secondary | ICD-10-CM

## 2023-12-26 ENCOUNTER — Ambulatory Visit: Payer: 59 | Admitting: Cardiology

## 2023-12-26 ENCOUNTER — Ambulatory Visit: Payer: Self-pay | Admitting: Nurse Practitioner

## 2023-12-26 ENCOUNTER — Ambulatory Visit: Payer: 59 | Admitting: Physician Assistant

## 2023-12-26 NOTE — Telephone Encounter (Signed)
 Please advise [facility]

## 2023-12-26 NOTE — Telephone Encounter (Signed)
 Chief Complaint: Flu-like symptoms Symptoms: Cough, congestion, body aches, fatigue, runny nose Frequency: constant since onset this morning Pertinent Negatives: Patient denies fever, chest pain, SOB Disposition: [] ED /[] Urgent Care (no appt availability in office) / [] Appointment(In office/virtual)/ [x]  [facility]/ [] Home Care/ [] Refused Recommended Disposition /[] [facility]/ []  Follow-up with PCP Additional Notes: Patient's daughter states the patient started coughing in the middle of the night and has progressively gotten worse today. The daughter stated that her 3 children and her have tested positive for the Flu today. The daughter is concerned that the patient has it too since she is staying with them. Care advice was given and daughter requested a virtual visit to get Rx prescribed due to the patient's age. No appointments available at the practice so a virtual urgent care appointment has been scheduled for tomorrow morning.   Copied from CRM [id number]. Topic: Clinical - Red Word Triage >> Dec 26, 2023  4:12 PM Susanna ORN wrote: Red Word that prompted transfer to Nurse Triage: Pt's daughter, [name], calling in stating that her mother is having flu like symptoms that started today. States pt took flu shot at pharmacy & she's now coughing & has body pain. Daughter also stated that her household tested positive for the flu & pt is there with them. Reason for Disposition  Patient is HIGH RISK (e.g., age > 64 years, pregnant, HIV+, or chronic medical condition)  Answer Assessment - Initial Assessment Questions 1. WORST SYMPTOM: What is your worst symptom? (e.g., cough, runny nose, muscle aches, headache, sore throat, fever)      Cough 2. ONSET: When did your flu symptoms start?      Today  3. COUGH: How bad is the cough?       Moderate  4. RESPIRATORY DISTRESS: Describe your breathing.      No 5. FEVER: Do you have a fever? If Yes, ask: What is your temperature,  how was it measured, and when did it start?     I did not check  6. EXPOSURE: Were you exposed to someone with influenza?       Yes, my daughter and her 3 children have the Flu  7. FLU VACCINE: Did you get a flu shot this year?     Yes, on Monday this week 8. HIGH RISK DISEASE: Do you have any chronic medical problems? (e.g., heart or lung disease, asthma, weak immune system, or other HIGH RISK conditions)     Yes, 10. OTHER SYMPTOMS: Do you have any other symptoms?  (e.g., runny nose, muscle aches, headache, sore throat)       Sore throats, cough, body aches, headache,runny nose, congestion  Protocols used: Influenza (Flu) - [facility]

## 2023-12-27 ENCOUNTER — Ambulatory Visit: Payer: 59 | Admitting: Cardiology

## 2023-12-27 ENCOUNTER — Telehealth: Payer: 59 | Admitting: Emergency Medicine

## 2023-12-27 DIAGNOSIS — R6889 Other general symptoms and signs: Secondary | ICD-10-CM

## 2023-12-27 MED ORDER — OSELTAMIVIR PHOSPHATE 75 MG PO CAPS: 75.0000 mg | ORAL_CAPSULE | Freq: Two times a day (BID) | ORAL | 0 refills | Status: DC

## 2023-12-27 MED ORDER — BENZONATATE 100 MG PO CAPS: 100.0000 mg | ORAL_CAPSULE | Freq: Two times a day (BID) | ORAL | 0 refills | Status: DC | PRN

## 2023-12-27 NOTE — Patient Instructions (Signed)
 Beth Hubbard, thank you for joining Lamar Schlossman, PA-C for today's virtual visit.  While this provider is not your primary care provider (PCP), if your PCP is located in our provider database this encounter information will be shared with them immediately following your visit.   A [facility] MyChart account gives you access to today's visit and all your visits, tests, and labs performed at [facility]  click here if you don't have a [facility] MyChart account or go to [email or web address]  Consent: (Patient) Beth Hubbard provided verbal consent for this virtual visit at the beginning of the encounter.  Current Medications:  Current Outpatient Medications:    benzonatate  (TESSALON ) 100 MG capsule, Take 1 capsule (100 mg total) by mouth 2 (two) times daily as needed for cough., Disp: 20 capsule, Rfl: 0   oseltamivir  (TAMIFLU ) 75 MG capsule, Take 1 capsule (75 mg total) by mouth every 12 (twelve) hours., Disp: 10 capsule, Rfl: 0   ACCU-CHEK AVIVA PLUS test strip, Reported on 02/24/2016, Disp: 100 each, Rfl: 1   Accu-Chek Softclix Lancets lancets, Check BS once or twice a day., Disp: 100 each, Rfl: 1   acetaminophen  (TYLENOL ) 500 MG tablet, Take 500 mg by mouth every 6 (six) hours as needed., Disp: , Rfl:    amLODipine  (NORVASC ) 10 MG tablet, Take 1 tablet (10 mg total) by mouth daily., Disp: 30 tablet, Rfl: 3   blood glucose meter kit and supplies KIT, Dispense based on patient and insurance preference. Use up to four times daily as directed., Disp: 1 each, Rfl: 11   Blood Pressure Monitor KIT, 1 kit by Does not apply route daily., Disp: 1 kit, Rfl: 0   brimonidine-timolol (COMBIGAN) 0.2-0.5 % ophthalmic solution, Place 1 drop into both eyes 2 (two) times daily., Disp: , Rfl:    cetirizine  (ZYRTEC ) 10 MG tablet, Take 1 tablet (10 mg total) by mouth daily., Disp: 30 tablet, Rfl: 11   cycloSPORINE  (RESTASIS ) 0.05 % ophthalmic emulsion, Place 1 drop into both eyes 2 (two) times  daily., Disp: , Rfl:    dorzolamide  (TRUSOPT ) 2 % ophthalmic solution, Place 1 drop into both eyes 2 (two) times daily., Disp: , Rfl:    Doxylamine  Succinate, Sleep, (SLEEP AID PO), Take by mouth., Disp: , Rfl:    gabapentin  (NEURONTIN ) 300 MG capsule, TAKE 1 CAPSULE BY MOUTH EVERY NIGHT AT BEDTIME, Disp: 90 capsule, Rfl: 0   hydrOXYzine  (VISTARIL ) 25 MG capsule, Take 1 capsule (25 mg total) by mouth at bedtime as needed., Disp: 30 capsule, Rfl: 0   ofloxacin  (FLOXIN ) 0.3 % OTIC solution, Place 5 drops into the right ear daily., Disp: 5 mL, Rfl: 0   prednisoLONE acetate (PRED FORTE) 1 % ophthalmic suspension, Place 1 drop into the left eye 4 (four) times daily., Disp: , Rfl:    rosuvastatin  (CRESTOR ) 20 MG tablet, TAKE 1 TABLET(20 MG) BY MOUTH DAILY, Disp: 90 tablet, Rfl: 3   sitaGLIPtin  (JANUVIA ) 50 MG tablet, TAKE 1 TABLET(50 MG) BY MOUTH DAILY, Disp: 30 tablet, Rfl: 0   traZODone  (DESYREL ) 100 MG tablet, TAKE 1 TABLET(100 MG) BY MOUTH AT BEDTIME, Disp: 30 tablet, Rfl: 2   valsartan  (DIOVAN ) 80 MG tablet, Take 1 tablet (80 mg total) by mouth daily., Disp: 90 tablet, Rfl: 3   Medications ordered in this encounter:  Meds ordered this encounter  Medications   oseltamivir  (TAMIFLU ) 75 MG capsule    Sig: Take 1 capsule (75 mg total) by mouth every 12 (twelve) hours.  Dispense:  10 capsule    Refill:  0    Supervising Provider:   LAMPTEY, PHILIP O [[id number]]   benzonatate  (TESSALON ) 100 MG capsule    Sig: Take 1 capsule (100 mg total) by mouth 2 (two) times daily as needed for cough.    Dispense:  20 capsule    Refill:  0    Supervising Provider:   BLAISE ALEENE KIDD [[id number]]     *If you need refills on other medications prior to your next appointment, please contact your pharmacy*  Follow-Up: Call back or seek an in-person evaluation if the symptoms worsen or if the condition fails to improve as anticipated.  [facility] [phone]  Other Instructions    If you  have been instructed to have an in-person evaluation today at a local Urgent Care facility, please use the link below. It will take you to a list of all of our available [facility], including address, phone number and hours of operation. Please do not delay care.  [facility]  If you or a family member do not have a primary care provider, use the link below to schedule a visit and establish care. When you choose a [facility] primary care physician or advanced practice provider, you gain a long-term partner in health. Find a Primary Care Provider  Learn more about [facility] in-office and virtual care options: [facility] - Get Care Now

## 2023-12-27 NOTE — Progress Notes (Signed)
 Virtual Visit Consent   Beth Hubbard, you are scheduled for a virtual visit with a [facility] provider today. Just as with appointments in the office, your consent must be obtained to participate. Your consent will be active for this visit and any virtual visit you may have with one of our providers in the next 365 days. If you have a MyChart account, a copy of this consent can be sent to you electronically.  As this is a virtual visit, video technology does not allow for your provider to perform a traditional examination. This may limit your provider's ability to fully assess your condition. If your provider identifies any concerns that need to be evaluated in person or the need to arrange testing (such as labs, EKG, etc.), we will make arrangements to do so. Although advances in technology are sophisticated, we cannot ensure that it will always work on either your end or our end. If the connection with a video visit is poor, the visit may have to be switched to a telephone visit. With either a video or telephone visit, we are not always able to ensure that we have a secure connection.  By engaging in this virtual visit, you consent to the provision of healthcare and authorize for your insurance to be billed (if applicable) for the services provided during this visit. Depending on your insurance coverage, you may receive a charge related to this service.  I need to obtain your verbal consent now. Are you willing to proceed with your visit today? Beth Hubbard has provided verbal consent on 12/27/2023 for a virtual visit (video or telephone). Lamar Schlossman, PA-C  Date: 12/27/2023 10:21 AM  Virtual Visit via Video Note   I, Lamar Schlossman, connected with  Beth Hubbard  ([id number], Mar 24, 1946) on 12/27/23 at 10:15 AM EST by a video-enabled telemedicine application and verified that I am speaking with the correct person using two identifiers.  Location: Patient: Virtual Visit Location Patient:  Home Provider: Virtual Visit Location Provider: Home Office   I discussed the limitations of evaluation and management by telemedicine and the availability of in person appointments. The patient expressed understanding and agreed to proceed.    History of Present Illness: Beth Hubbard is a 78 y.o. who identifies as a female who was assigned female at birth, and is being seen today for body aches, cough, and sore throat.  Family members tested positive for flu.  Had some mild symptoms yesterday, but mainly worsened today.  Denies fever.    HPI: HPI  Problems:  Patient Active Problem List   Diagnosis Date Noted   Acute low back pain without sciatica 07/12/2023   Rotator cuff tear, left 01/11/2023   Arthritis of left acromioclavicular joint 01/11/2023   Traumatic partial tear of left biceps tendon 01/11/2023   Diabetic neuropathy (HCC) 12/14/2022   Falls, initial encounter 11/30/2022   COVID-19 virus infection 11/05/2022   Hyperlipidemia 08/20/2022   SBO (small bowel obstruction) (HCC) 08/18/2022   Hypertension    Diabetes mellitus without complication (HCC)    Right orbit fracture (HCC) 03/04/2022   Pulmonary HTN (HCC) 09/23/2020   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HCC) 06/29/2020   OSA (obstructive sleep apnea) 10/18/2015   Mitral regurgitation 05/18/2015   Excessive daytime sleepiness 02/28/2015   TIA (transient ischemic attack)    Hypertension    Diabetes mellitus without complication (HCC)    High cholesterol     Allergies:  Allergies  Allergen Reactions  Aspirin      Tremor    Aspirin  Other (See Comments)    Ringing in Ear   Ibuprofen Other (See Comments)    Ringing in Ear   Penicillins Itching and Other (See Comments)    Reaction: itching,headache, dizziness and anxiety. Feels like she is goin off Did it involve swelling of the face/tongue/throat, SOB, or low BP? No Did it involve sudden or severe rash/hives, skin peeling, or any reaction  on the inside of your mouth or nose? No Did you need to seek medical attention at a hospital or doctor's office? No When did it last happen?      Childhood If all above answers are "NO", may proceed with cephalosporin use.   Penicillins Other (See Comments)    Patient fainted   Nsaids Anxiety    Reaction: causes dizziness and headache. Ibuprofen. Aspirin    Medications:  Current Outpatient Medications:    benzonatate  (TESSALON ) 100 MG capsule, Take 1 capsule (100 mg total) by mouth 2 (two) times daily as needed for cough., Disp: 20 capsule, Rfl: 0   oseltamivir  (TAMIFLU ) 75 MG capsule, Take 1 capsule (75 mg total) by mouth every 12 (twelve) hours., Disp: 10 capsule, Rfl: 0   ACCU-CHEK AVIVA PLUS test strip, Reported on 02/24/2016, Disp: 100 each, Rfl: 1   Accu-Chek Softclix Lancets lancets, Check BS once or twice a day., Disp: 100 each, Rfl: 1   acetaminophen  (TYLENOL ) 500 MG tablet, Take 500 mg by mouth every 6 (six) hours as needed., Disp: , Rfl:    amLODipine  (NORVASC ) 10 MG tablet, Take 1 tablet (10 mg total) by mouth daily., Disp: 30 tablet, Rfl: 3   blood glucose meter kit and supplies KIT, Dispense based on patient and insurance preference. Use up to four times daily as directed., Disp: 1 each, Rfl: 11   Blood Pressure Monitor KIT, 1 kit by Does not apply route daily., Disp: 1 kit, Rfl: 0   brimonidine-timolol (COMBIGAN) 0.2-0.5 % ophthalmic solution, Place 1 drop into both eyes 2 (two) times daily., Disp: , Rfl:    cetirizine  (ZYRTEC ) 10 MG tablet, Take 1 tablet (10 mg total) by mouth daily., Disp: 30 tablet, Rfl: 11   cycloSPORINE  (RESTASIS ) 0.05 % ophthalmic emulsion, Place 1 drop into both eyes 2 (two) times daily., Disp: , Rfl:    dorzolamide  (TRUSOPT ) 2 % ophthalmic solution, Place 1 drop into both eyes 2 (two) times daily., Disp: , Rfl:    Doxylamine  Succinate, Sleep, (SLEEP AID PO), Take by mouth., Disp: , Rfl:    gabapentin  (NEURONTIN ) 300 MG capsule, TAKE 1 CAPSULE BY MOUTH  EVERY NIGHT AT BEDTIME, Disp: 90 capsule, Rfl: 0   hydrOXYzine  (VISTARIL ) 25 MG capsule, Take 1 capsule (25 mg total) by mouth at bedtime as needed., Disp: 30 capsule, Rfl: 0   ofloxacin  (FLOXIN ) 0.3 % OTIC solution, Place 5 drops into the right ear daily., Disp: 5 mL, Rfl: 0   prednisoLONE acetate (PRED FORTE) 1 % ophthalmic suspension, Place 1 drop into the left eye 4 (four) times daily., Disp: , Rfl:    rosuvastatin  (CRESTOR ) 20 MG tablet, TAKE 1 TABLET(20 MG) BY MOUTH DAILY, Disp: 90 tablet, Rfl: 3   sitaGLIPtin  (JANUVIA ) 50 MG tablet, TAKE 1 TABLET(50 MG) BY MOUTH DAILY, Disp: 30 tablet, Rfl: 0   traZODone  (DESYREL ) 100 MG tablet, TAKE 1 TABLET(100 MG) BY MOUTH AT BEDTIME, Disp: 30 tablet, Rfl: 2   valsartan  (DIOVAN ) 80 MG tablet, Take 1 tablet (80 mg total) by mouth daily.,  Disp: 90 tablet, Rfl: 3  Observations/Objective: Patient is well-developed, well-nourished in no acute distress.  Resting comfortably  at home.  Head is normocephalic, atraumatic.  No labored breathing.  Speech is clear and coherent with logical content.  Patient is alert and oriented at baseline.    Assessment and Plan: 1. Flu-like symptoms (Primary)  Meds ordered this encounter  Medications   oseltamivir  (TAMIFLU ) 75 MG capsule    Sig: Take 1 capsule (75 mg total) by mouth every 12 (twelve) hours.    Dispense:  10 capsule    Refill:  0    Supervising Provider:   LAMPTEY, PHILIP O [[id number]]   benzonatate  (TESSALON ) 100 MG capsule    Sig: Take 1 capsule (100 mg total) by mouth 2 (two) times daily as needed for cough.    Dispense:  20 capsule    Refill:  0    Supervising Provider:   BLAISE ALEENE KIDD [id number]     CMP on 12/13/23 notable for GFR of 72.  Recommend Tamiflu  and Tessalon .  Return/follow-up recommendations given.    Follow Up Instructions: I discussed the assessment and treatment plan with the patient. The patient was provided an opportunity to ask questions and all were answered. The  patient agreed with the plan and demonstrated an understanding of the instructions.  A copy of instructions were sent to the patient via MyChart unless otherwise noted below.     The patient was advised to call back or seek an in-person evaluation if the symptoms worsen or if the condition fails to improve as anticipated.    Lamar Schlossman, PA-C

## 2023-12-30 ENCOUNTER — Telehealth: Payer: Self-pay

## 2024-01-02 NOTE — Telephone Encounter (Signed)
LM

## 2024-01-27 ENCOUNTER — Other Ambulatory Visit: Payer: Self-pay | Admitting: Nurse Practitioner

## 2024-01-27 DIAGNOSIS — E1129 Type 2 diabetes mellitus with other diabetic kidney complication: Secondary | ICD-10-CM

## 2024-01-29 NOTE — Progress Notes (Deleted)
 Cardiology Office Note:    Date:  01/29/2024   ID:  Beth Hubbard, Beth Hubbard 03-Dec-1945, MRN [id number]  PCP:  Ivonne Andrew, NP  Cardiologist:  None  Sleep Medicine:  Armanda Magic, MD  Referring MD: Ivonne Andrew, NP   No chief complaint on file.   History of Present Illness:    Beth Hubbard is a 78 y.o. female with a hx of mild OSA with an AHI of 11.9/hr with oxygen desaturations as low as 83% and is on CPAP at 15cm H2O.   Unfortunately after a fall where she sustained fractures in some bones in her face and had to have a 7 hours reconstructive surgery and has not been able to tolerate the mask.   Past Medical History:  Diagnosis Date   Arthritis    Diabetes mellitus without complication (HCC)    Excessive daytime sleepiness    Glaucoma    High cholesterol    Hyperlipemia    Hypertension    Mitral regurgitation    mild by echo 03/2017   OSA (obstructive sleep apnea) 10/18/2015   Mild OSA with AHI 12.5/hr on CPAP at 8cm H2O   Pneumonia    Shingles    TIA (transient ischemic attack)     Past Surgical History:  Procedure Laterality Date   ABDOMINAL SURGERY     BICEPT TENODESIS Left 01/11/2023   Procedure: BICEPS TENODESIS;  Surgeon: Jodi Geralds, MD;  Location: [facility];  Service: Orthopedics;  Laterality: Left;   CANTHOPLASTY Right 03/30/2022   Procedure: CANTHOPLASTY;  Surgeon: Dairl Ponder, MD;  Location: [facility] OR;  Service: Ophthalmology;  Laterality: Right;   EYE EXAMINATION UNDER ANESTHESIA Right 03/30/2022   Procedure: EYE EXAM UNDER ANESTHESIA;  Surgeon: Dairl Ponder, MD;  Location: [facility] OR;  Service: Ophthalmology;  Laterality: Right;   EYE SURGERY Bilateral    cataract surgery   ORIF ORBITAL FRACTURE Right 03/30/2022   Procedure: ORBITAL FLOOR FRACTURE REPAIR WITH IMPLANT;  Surgeon: Dairl Ponder, MD;  Location: [facility] OR;  Service: Ophthalmology;  Laterality: Right;   ROTATOR CUFF REPAIR     bilateral   SHOULDER ARTHROSCOPY WITH OPEN  ROTATOR CUFF REPAIR AND DISTAL CLAVICLE ACROMINECTOMY Left 01/11/2023   Procedure: ARTHROSCOPY SHOULDER MINI OPEN ROTATOR CUFF REPAIR, SUBACROMIAL DECOMPRESSION AND DISTAL CLAVICLE RESECTION;  Surgeon: Jodi Geralds, MD;  Location: [facility];  Service: Orthopedics;  Laterality: Left;   TEE WITHOUT CARDIOVERSION N/A 07/20/2016   Procedure: TRANSESOPHAGEAL ECHOCARDIOGRAM (TEE);  Surgeon: Chrystie Nose, MD;  Location: [facility] ENDOSCOPY;  Service: Cardiovascular;  Laterality: N/A;   TOOTH EXTRACTION Left 10/09/2019   Procedure: DENTAL EXTRACTION X1 and Irrigation and Debridement.;  Surgeon: Ocie Doyne, DDS;  Location: [facility] OR;  Service: Oral Surgery;  Laterality: Left;  DENTAL EXTRACTION X1 and Irrigation and Debridement.    Current Medications: No outpatient medications have been marked as taking for the 01/30/24 encounter (Appointment) with Quintella Reichert, MD.     Allergies:   Aspirin, Aspirin, Ibuprofen, Penicillins, Penicillins, and Nsaids   Social History   Socioeconomic History   Marital status: Legally Separated    Spouse name: Not on file   Number of children: Not on file   Years of education: Not on file   Highest education level: Not on file  Occupational History   Not on file  Tobacco Use   Smoking status: Never   Smokeless tobacco: Never  Vaping Use   Vaping status: Never Used  Substance and Sexual  Activity   Alcohol use: Never   Drug use: Never   Sexual activity: Not Currently  Other Topics Concern   Not on file  Social History Narrative   ** Merged History Encounter **       Social Drivers of Health   Financial Resource Strain: Low Risk  (12/13/2023)   Overall Financial Resource Strain (CARDIA)    Difficulty of Paying Living Expenses: Not hard at all  Food Insecurity: No Food Insecurity (12/13/2023)   Hunger Vital Sign    Worried About Running Out of Food in the Last Year: Never true    Ran Out of Food in the Last Year: Never true  Transportation  Needs: No Transportation Needs (12/13/2023)   PRAPARE - Administrator, Civil Service (Medical): No    Lack of Transportation (Non-Medical): No  Physical Activity: Insufficiently Active (12/13/2023)   Exercise Vital Sign    Days of Exercise per Week: 2 days    Minutes of Exercise per Session: 20 min  Stress: No Stress Concern Present (12/13/2023)   [employer] of Occupational Health - Occupational Stress Questionnaire    Feeling of Stress : Only a little  Social Connections: Unknown (12/13/2023)   Social Connection and Isolation Panel [NHANES]    Frequency of Communication with Friends and Family: More than three times a week    Frequency of Social Gatherings with Friends and Family: More than three times a week    Attends Religious Services: More than 4 times per year    Active Member of [employer] or Organizations: Yes    Attends Engineer, structural: More than 4 times per year    Marital Status: Patient declined     Family History: The patient's family history includes Cancer in her brother, brother, father, sister, and sister. There is no history of Breast cancer.  ROS:   Please see the history of present illness.    ROS  All other systems reviewed and negative.   EKGs/Labs/Other Studies Reviewed:    The following studies were reviewed today: PAP compliance download    Recent Labs: 12/13/2023: ALT 9; BUN 11; Creatinine, Ser 0.83; Hemoglobin 11.3; Platelets 239; Potassium 4.4; Sodium 145   Recent Lipid Panel    Component Value Date/Time   CHOL 174 12/13/2023 1043   TRIG 128 12/13/2023 1043   HDL 53 12/13/2023 1043   CHOLHDL 3.3 12/13/2023 1043   CHOLHDL 2.7 04/08/2017 0909   VLDL 27 04/08/2017 0909   LDLCALC 98 12/13/2023 1043    Physical Exam:    VS:  There were no vitals taken for this visit.    Wt Readings from Last 3 Encounters:  12/24/23 151 lb (68.5 kg)  12/13/23 153 lb (69.4 kg)  09/13/23 154 lb (69.9 kg)    GEN: Well nourished,  well developed in no acute distress HEENT: Normal NECK: No JVD; No carotid bruits LYMPHATICS: No lymphadenopathy CARDIAC:RRR, no murmurs, rubs, gallops RESPIRATORY:  Clear to auscultation without rales, wheezing or rhonchi  ABDOMEN: Soft, non-tender, non-distended MUSCULOSKELETAL:  No edema; No deformity  SKIN: Warm and dry NEUROLOGIC:  Alert and oriented x 3 PSYCHIATRIC:  Normal affect  ASSESSMENT:    1. OSA (obstructive sleep apnea)   2. Essential hypertension     PLAN:    In order of problems listed above:  1.  OSA  -she has not been able to use her CPAP  due to a fall with fracture facial bones involving her eye requiring extensive  surgery -she does not think she will be able to tolerate the mask going forward.  -We repeated a sleep study 08/11/2022 showing no evidence of obstructive sleep apnea with an AHI of 4.8/h but did have severe snoring.  2.  HTN -Controlled on exam today -Continue prescription drug management with amlodipine 10 mg daily and valsartan 80 mg daily with as needed refills    Medication Adjustments/Labs and Tests Ordered: Current medicines are reviewed at length with the patient today.  Concerns regarding medicines are outlined above.  No orders of the defined types were placed in this encounter.  No orders of the defined types were placed in this encounter.   Signed, Armanda Magic, MD  01/29/2024 6:16 PM    [facility]

## 2024-01-30 ENCOUNTER — Ambulatory Visit: Payer: 59 | Admitting: Cardiology

## 2024-01-30 ENCOUNTER — Ambulatory Visit (INDEPENDENT_AMBULATORY_CARE_PROVIDER_SITE_OTHER): Payer: Self-pay

## 2024-01-30 VITALS — BP 150/76 | Ht 64.0 in | Wt 150.0 lb

## 2024-01-30 DIAGNOSIS — Z780 Asymptomatic menopausal state: Secondary | ICD-10-CM | POA: Diagnosis not present

## 2024-01-30 DIAGNOSIS — I10 Essential (primary) hypertension: Secondary | ICD-10-CM

## 2024-01-30 DIAGNOSIS — Z1231 Encounter for screening mammogram for malignant neoplasm of breast: Secondary | ICD-10-CM

## 2024-01-30 DIAGNOSIS — G4733 Obstructive sleep apnea (adult) (pediatric): Secondary | ICD-10-CM

## 2024-01-30 DIAGNOSIS — Z0000 Encounter for general adult medical examination without abnormal findings: Secondary | ICD-10-CM

## 2024-01-30 NOTE — Patient Instructions (Signed)
 Beth Hubbard , Thank you for taking time to come for your Medicare Wellness Visit. I appreciate your ongoing commitment to your health goals. Please review the following plan we discussed and let me know if I can assist you in the future.   Referrals/Orders/Follow-Ups/Clinician Recommendations:  Next Medicare Annual Wellness Visit:   February 04, 2025 at 8:40 am video visit  You have an order for:  []   2D Mammogram  [x]   3D Mammogram  [x]   Bone Density     Please call for appointment:  [facility] [address] [place], [place] [address] [phone]  Make sure to wear two-piece clothing.  No lotions powders or deodorants the day of the appointment Make sure to bring picture ID and insurance card.  Bring list of medications you are currently taking including any supplements.   Schedule your [place] screening mammogram through MyChart!   Log into your MyChart account.  Go to 'Visit' (or 'Appointments' if on mobile App) --> Schedule an Appointment  Under 'Select a Reason for Visit' choose the Mammogram Screening option.  Complete the pre-visit questions and select the time and place that best fits your schedule.    This is a list of the screening recommended for you and due dates:  Health Maintenance  Topic Date Due   Zoster (Shingles) Vaccine (1 of 2) Never done   DEXA scan (bone density measurement)  03/29/2018   COVID-19 Vaccine (3 - Pfizer risk series) 06/24/2020   Mammogram  09/09/2021   Eye exam for diabetics  03/05/2023   Complete foot exam   12/01/2023   Flu Shot  02/17/2024*   Hemoglobin A1C  06/11/2024   Yearly kidney function blood test for diabetes  12/12/2024   Yearly kidney health urinalysis for diabetes  12/12/2024   Medicare Annual Wellness Visit  01/29/2025   DTaP/Tdap/Td vaccine (2 - Td or Tdap) 02/23/2025   Pneumonia Vaccine  Completed   Hepatitis C Screening  Completed   HPV Vaccine  Aged Out   Colon Cancer Screening   Discontinued  *Topic was postponed. The date shown is not the original due date.    Advanced directives: (Provided) Advance directive discussed with you today. I have provided a copy for you to complete at home and have notarized. Once this is complete, please bring a copy in to our office so we can scan it into your chart.   Next Medicare Annual Wellness Visit scheduled for next year: yes  Understanding Your Risk for Falls Millions of people have serious injuries from falls each year. It is important to understand your risk of falling. Talk with your health care provider about your risk and what you can do to lower it. If you do have a serious fall, make sure to tell your provider. Falling once raises your risk of falling again. How can falls affect me? Serious injuries from falls are common. These include: Broken bones, such as hip fractures. Head injuries, such as traumatic brain injuries (TBI) or concussions. A fear of falling can cause you to avoid activities and stay at home. This can make your muscles weaker and raise your risk for a fall. What can increase my risk? There are a number of risk factors that increase your risk for falling. The more risk factors you have, the higher your risk of falling. Serious injuries from a fall happen most often to people who are older than 79 years old. Teenagers and young adults ages 26-29 are  also at higher risk. Common risk factors include: Weakness in the lower body. Being generally weak or confused due to long-term (chronic) illness. Dizziness or balance problems. Poor vision. Medicines that cause dizziness or drowsiness. These may include: Medicines for your blood pressure, heart, anxiety, insomnia, or swelling (edema). Pain medicines. Muscle relaxants. Other risk factors include: Drinking alcohol. Having had a fall in the past. Having foot pain or wearing improper footwear. Working at a dangerous job. Having any of the following in  your home: Tripping hazards, such as floor clutter or loose rugs. Poor lighting. Pets. Having dementia or memory loss. What actions can I take to lower my risk of falling?     Physical activity Stay physically fit. Do strength and balance exercises. Consider taking a regular class to build strength and balance. Yoga and tai chi are good options. Vision Have your eyes checked every year and your prescription for glasses or contacts updated as needed. Shoes and walking aids Wear non-skid shoes. Wear shoes that have rubber soles and low heels. Do not wear high heels. Do not walk around the house in socks or slippers. Use a cane or walker as told by your provider. Home safety Attach secure railings on both sides of your stairs. Install grab bars for your bathtub, shower, and toilet. Use a non-skid mat in your bathtub or shower. Attach bath mats securely with double-sided, non-slip rug tape. Use good lighting in all rooms. Keep a flashlight near your bed. Make sure there is a clear path from your bed to the bathroom. Use night-lights. Do not use throw rugs. Make sure all carpeting is taped or tacked down securely. Remove all clutter from walkways and stairways, including extension cords. Repair uneven or broken steps and floors. Avoid walking on icy or slippery surfaces. Walk on the grass instead of on icy or slick sidewalks. Use ice melter to get rid of ice on walkways in the winter. Use a cordless phone. Questions to ask your health care provider Can you help me check my risk for a fall? Do any of my medicines make me more likely to fall? Should I take a vitamin D supplement? What exercises can I do to improve my strength and balance? Should I make an appointment to have my vision checked? Do I need a bone density test to check for weak bones (osteoporosis)? Would it help to use a cane or a walker? Where to find more information Centers for Disease Control and Prevention, STEADI:  TonerPromos.no Community-Based Fall Prevention Programs: TonerPromos.no [employer] on Aging: BaseRingTones.pl Contact a health care provider if: You fall at home. You are afraid of falling at home. You feel weak, drowsy, or dizzy. This information is not intended to replace advice given to you by your health care provider. Make sure you discuss any questions you have with your health care provider. Document Revised: 07/09/2022 Document Reviewed: 07/09/2022 Elsevier Patient Education  2024 [employer].

## 2024-01-30 NOTE — Progress Notes (Signed)
 Because this visit was a virtual/telehealth visit,  certain criteria was not obtained, such a blood pressure, CBG if applicable, and timed get up and go. Any medications not marked as "taking" were not mentioned during the medication reconciliation part of the visit. Any vitals not documented were not able to be obtained due to this being a telehealth visit or patient was unable to self-report a recent blood pressure reading due to a lack of equipment at home via telehealth. Vitals that have been documented are verbally provided by the patient.   Subjective:   Beth Hubbard is a 78 y.o. who presents for a Medicare Wellness preventive visit.  Visit Complete: Virtual I connected with  Tijah Hane Alemu on 01/30/24 by a video and audio enabled telemedicine application and verified that I am speaking with the correct person using two identifiers.  Patient Location: Home  Provider Location: Home Office  I discussed the limitations of evaluation and management by telemedicine. The patient expressed understanding and agreed to proceed.  Vital Signs: Because this visit was a virtual/telehealth visit, some criteria may be missing or patient reported. Any vitals not documented were not able to be obtained and vitals that have been documented are patient reported.  AWV Questionnaire: No: Patient Medicare AWV questionnaire was not completed prior to this visit.  Cardiac Risk Factors include: advanced age (>24men, >69 women);diabetes mellitus;hypertension;dyslipidemia     Objective:    Today's Vitals   01/30/24 0849 01/30/24 0855  BP: (!) 150/76   Weight: 150 lb (68 kg)   Height: 5\' 4"  (1.626 m)   PainSc:  6    Body mass index is 25.75 kg/m.     01/30/2024    9:04 AM 01/24/2023    9:01 AM 01/11/2023    8:12 AM 08/18/2022    9:50 PM 08/18/2022    9:46 AM 03/30/2022    6:56 AM 03/04/2022   11:09 PM  Advanced Directives  Does Patient Have a Medical Advance Directive? No No No No No No   Does patient  want to make changes to medical advance directive?      No - Patient declined   Would patient like information on creating a medical advance directive? Yes (MAU/Ambulatory/Procedural Areas - Information given) No - Patient declined No - Patient declined No - Patient declined No - Patient declined  No - Patient declined    Current Medications (verified) Outpatient Encounter Medications as of 01/30/2024  Medication Sig   ACCU-CHEK AVIVA PLUS test strip Reported on 02/24/2016   Accu-Chek Softclix Lancets lancets Check BS once or twice a day.   acetaminophen (TYLENOL) 500 MG tablet Take 500 mg by mouth every 6 (six) hours as needed.   amLODipine (NORVASC) 10 MG tablet Take 1 tablet (10 mg total) by mouth daily.   blood glucose meter kit and supplies KIT Dispense based on patient and insurance preference. Use up to four times daily as directed.   Blood Pressure Monitor KIT 1 kit by Does not apply route daily.   brimonidine-timolol (COMBIGAN) 0.2-0.5 % ophthalmic solution Place 1 drop into both eyes 2 (two) times daily.   cetirizine (ZYRTEC) 10 MG tablet Take 1 tablet (10 mg total) by mouth daily.   dorzolamide (TRUSOPT) 2 % ophthalmic solution Place 1 drop into both eyes 2 (two) times daily.   Doxylamine Succinate, Sleep, (SLEEP AID PO) Take by mouth.   gabapentin (NEURONTIN) 300 MG capsule TAKE 1 CAPSULE BY MOUTH EVERY NIGHT AT BEDTIME   hydrOXYzine (  VISTARIL) 25 MG capsule Take 1 capsule (25 mg total) by mouth at bedtime as needed.   rosuvastatin (CRESTOR) 20 MG tablet TAKE 1 TABLET(20 MG) BY MOUTH DAILY   sitaGLIPtin (JANUVIA) 50 MG tablet TAKE 1 TABLET(50 MG) BY MOUTH DAILY   traZODone (DESYREL) 100 MG tablet TAKE 1 TABLET(100 MG) BY MOUTH AT BEDTIME   valsartan (DIOVAN) 80 MG tablet Take 1 tablet (80 mg total) by mouth daily.   benzonatate (TESSALON) 100 MG capsule Take 1 capsule (100 mg total) by mouth 2 (two) times daily as needed for cough.   cycloSPORINE (RESTASIS) 0.05 % ophthalmic emulsion  Place 1 drop into both eyes 2 (two) times daily. (Patient not taking: Reported on 01/30/2024)   ofloxacin (FLOXIN) 0.3 % OTIC solution Place 5 drops into the right ear daily. (Patient not taking: Reported on 01/30/2024)   oseltamivir (TAMIFLU) 75 MG capsule Take 1 capsule (75 mg total) by mouth every 12 (twelve) hours. (Patient not taking: Reported on 01/30/2024)   prednisoLONE acetate (PRED FORTE) 1 % ophthalmic suspension Place 1 drop into the left eye 4 (four) times daily. (Patient not taking: Reported on 01/30/2024)   No facility-administered encounter medications on file as of 01/30/2024.    Allergies (verified) Aspirin, Aspirin, Ibuprofen, Penicillins, Penicillins, and Nsaids   History: Past Medical History:  Diagnosis Date   Arthritis    Diabetes mellitus without complication (HCC)    Excessive daytime sleepiness    Glaucoma    High cholesterol    Hyperlipemia    Hypertension    Mitral regurgitation    mild by echo 03/2017   OSA (obstructive sleep apnea) 10/18/2015   Mild OSA with AHI 12.5/hr on CPAP at 8cm H2O   Pneumonia    Shingles    TIA (transient ischemic attack)    Past Surgical History:  Procedure Laterality Date   ABDOMINAL SURGERY     BICEPT TENODESIS Left 01/11/2023   Procedure: BICEPS TENODESIS;  Surgeon: Jodi Geralds, MD;  Location: [facility];  Service: Orthopedics;  Laterality: Left;   CANTHOPLASTY Right 03/30/2022   Procedure: CANTHOPLASTY;  Surgeon: Dairl Ponder, MD;  Location: [facility] OR;  Service: Ophthalmology;  Laterality: Right;   EYE EXAMINATION UNDER ANESTHESIA Right 03/30/2022   Procedure: EYE EXAM UNDER ANESTHESIA;  Surgeon: Dairl Ponder, MD;  [facility] OR;  Service: Ophthalmology;  Laterality: Right;   EYE SURGERY Bilateral    cataract surgery   ORIF ORBITAL FRACTURE Right 03/30/2022   Procedure: ORBITAL FLOOR FRACTURE REPAIR WITH IMPLANT;  Surgeon: Dairl Ponder, MD;  Location: [facility] OR;  Service: Ophthalmology;  Laterality:  Right;   ROTATOR CUFF REPAIR     bilateral   SHOULDER ARTHROSCOPY WITH OPEN ROTATOR CUFF REPAIR AND DISTAL CLAVICLE ACROMINECTOMY Left 01/11/2023   Procedure: ARTHROSCOPY SHOULDER MINI OPEN ROTATOR CUFF REPAIR, SUBACROMIAL DECOMPRESSION AND DISTAL CLAVICLE RESECTION;  Surgeon: Jodi Geralds, MD;  Location: [facility];  Service: Orthopedics;  Laterality: Left;   TEE WITHOUT CARDIOVERSION N/A 07/20/2016   Procedure: TRANSESOPHAGEAL ECHOCARDIOGRAM (TEE);  Surgeon: Chrystie Nose, MD;  Location: [facility] ENDOSCOPY;  Service: Cardiovascular;  Laterality: N/A;   TOOTH EXTRACTION Left 10/09/2019   Procedure: DENTAL EXTRACTION X1 and Irrigation and Debridement.;  Surgeon: Ocie Doyne, DDS;  Location: [facility] OR;  Service: Oral Surgery;  Laterality: Left;  DENTAL EXTRACTION X1 and Irrigation and Debridement.   Family History  Problem Relation Age of Onset   Cancer Father    Cancer Sister    Cancer Brother  Breast cancer Neg Hx    Cancer Sister    Cancer Brother    Social History   Socioeconomic History   Marital status: Legally Separated    Spouse name: Not on file   Number of children: Not on file   Years of education: Not on file   Highest education level: Not on file  Occupational History   Not on file  Tobacco Use   Smoking status: Never   Smokeless tobacco: Never  Vaping Use   Vaping status: Never Used  Substance and Sexual Activity   Alcohol use: Never   Drug use: Never   Sexual activity: Not Currently  Other Topics Concern   Not on file  Social History Narrative   ** Merged History Encounter **       Social Drivers of Health   Financial Resource Strain: Low Risk  (01/30/2024)   Overall Financial Resource Strain (CARDIA)    Difficulty of Paying Living Expenses: Not hard at all  Food Insecurity: No Food Insecurity (01/30/2024)   Hunger Vital Sign    Worried About Running Out of Food in the Last Year: Never true    Ran Out of Food in the Last Year: Never true   Transportation Needs: No Transportation Needs (01/30/2024)   PRAPARE - Administrator, Civil Service (Medical): No    Lack of Transportation (Non-Medical): No  Physical Activity: Sufficiently Active (01/30/2024)   Exercise Vital Sign    Days of Exercise per Week: 7 days    Minutes of Exercise per Session: 30 min  Recent Concern: Physical Activity - Insufficiently Active (12/13/2023)   Exercise Vital Sign    Days of Exercise per Week: 2 days    Minutes of Exercise per Session: 20 min  Stress: No Stress Concern Present (01/30/2024)   [employer] of Occupational Health - Occupational Stress Questionnaire    Feeling of Stress : Not at all  Social Connections: Moderately Integrated (01/30/2024)   Social Connection and Isolation Panel [NHANES]    Frequency of Communication with Friends and Family: More than three times a week    Frequency of Social Gatherings with Friends and Family: More than three times a week    Attends Religious Services: More than 4 times per year    Active Member of [employer] or Organizations: Yes    Attends Engineer, structural: More than 4 times per year    Marital Status: Separated    Tobacco Counseling Counseling given: Yes    Clinical Intake:  Pre-visit preparation completed: Yes  Pain : 0-10 Pain Score: 6  Pain Type: Chronic pain Pain Location: Back Pain Orientation: Lower Pain Onset: More than a month ago Pain Frequency: Intermittent     BMI - recorded: 25.75 Nutritional Status: BMI 25 -29 Overweight Nutritional Risks: None Diabetes: Yes CBG done?: No (telehealth visit.)  How often do you need to have someone help you when you read instructions, pamphlets, or other written materials from your doctor or pharmacy?: 1 - Never  Interpreter Needed?: No  Information entered by :: Maryjean Ka CMA   Activities of Daily Living     01/30/2024    9:04 AM  In your present state of health, do you have any difficulty performing the  following activities:  Hearing? 0  Vision? 0  Difficulty concentrating or making decisions? 0  Walking or climbing stairs? 0  Dressing or bathing? 0  Doing errands, shopping? 1  Comment daughter takes patient where she  needs to go  Preparing Food and eating ? N  Using the Toilet? N  In the past six months, have you accidently leaked urine? N  Do you have problems with loss of bowel control? N  Managing your Medications? N  Managing your Finances? Y  Comment daughter does this  Housekeeping or managing your Housekeeping? Y  Comment family helps    Patient Care Team: Ivonne Andrew, NP as PCP - General (Pulmonary Disease) Quintella Reichert, MD as PCP - Cardiology (Cardiology) Myrtie Neither, MD as Consulting Physician (Orthopedic Surgery) Quintella Reichert, MD as Consulting Physician (Cardiology)  Indicate any recent Medical Services you may have received from other than [facility] providers in the past year (date may be approximate).     Assessment:   This is a routine wellness examination for Cierrah.  Hearing/Vision screen Hearing Screening - Comments:: Patient denies any hearing difficulties.   Vision Screening - Comments:: Patient wears reading glasses only. Up to date with yearly exams.  Patient sees [facility]    Goals Addressed             This Visit's Progress    Patient Stated       Remain as healthy, active, and independent as possible.        Depression Screen     01/30/2024    9:06 AM 07/12/2023   10:01 AM 01/24/2023    8:53 AM 11/30/2022    9:14 AM 08/24/2022    1:37 PM 03/09/2022    9:29 AM 01/26/2022    9:52 AM  PHQ 2/9 Scores  PHQ - 2 Score 0 0 0 0 0 0 0  PHQ- 9 Score 0 0   0 12 6    Fall Risk     01/30/2024    9:03 AM 09/13/2023    9:46 AM 07/12/2023   10:00 AM 05/31/2023    8:11 AM 01/24/2023    8:56 AM  Fall Risk   Falls in the past year? 0 1 0 0 1  Number falls in past yr: 0 0 0 0 0  Injury with Fall? 0 0 0 0 1  Comment      Fx bone under rt eye. Followed by medical attention  Risk for fall due to : No Fall Risks;History of fall(s);Impaired balance/gait;Impaired mobility Impaired mobility;Impaired balance/gait  No Fall Risks Mental status change  Follow up Falls prevention discussed;Education provided   Falls evaluation completed Falls prevention discussed;Education provided    MEDICARE RISK AT HOME:  Medicare Risk at Home Any stairs in or around the home?: Yes If so, are there any without handrails?: No Home free of loose throw rugs in walkways, pet beds, electrical cords, etc?: Yes Adequate lighting in your home to reduce risk of falls?: Yes Life alert?: No Use of a cane, walker or w/c?: Yes Grab bars in the bathroom?: Yes Shower chair or bench in shower?: Yes Elevated toilet seat or a handicapped toilet?: Yes  TIMED UP AND GO:  Was the test performed?  No  Cognitive Function: 6CIT completed      03/09/2022   11:26 AM  Montreal Cognitive Assessment   Visuospatial/ Executive (0/5) 0  Naming (0/3) 2  Attention: Read list of digits (0/2) 2  Attention: Read list of letters (0/1) 1  Attention: Serial 7 subtraction starting at 100 (0/3) 0  Language: Repeat phrase (0/2) 2  Language : Fluency (0/1) 1  Abstraction (0/2) 2  Delayed Recall (0/5)  0  Orientation (0/6) 6  Total 16  Adjusted Score (based on education) 16      01/30/2024    8:58 AM 01/24/2023    9:01 AM 08/15/2021    6:52 PM  6CIT Screen  What Year? 0 points 0 points 0 points  What month? 0 points 0 points 0 points  What time? 0 points 0 points 0 points  Count back from 20 0 points 0 points 0 points  Months in reverse 0 points 0 points 0 points  Repeat phrase 4 points 0 points 0 points  Total Score 4 points 0 points 0 points    Immunizations Immunization History  Administered Date(s) Administered   Influenza, High Dose Seasonal PF 09/23/2018   Influenza,inj,Quad PF,6+ Mos 09/23/2020   Influenza-Unspecified 06/19/2013    PFIZER(Purple Top)SARS-COV-2 Vaccination 04/28/2020, 05/27/2020   Pneumococcal Conjugate-13 02/24/2015   Pneumococcal Polysaccharide-23 03/05/2016   Tdap 02/24/2015    Screening Tests Health Maintenance  Topic Date Due   Zoster Vaccines- Shingrix (1 of 2) Never done   DEXA SCAN  03/29/2018   COVID-19 Vaccine (3 - Pfizer risk series) 06/24/2020   MAMMOGRAM  09/09/2021   OPHTHALMOLOGY EXAM  03/05/2023   FOOT EXAM  12/01/2023   INFLUENZA VACCINE  02/17/2024 (Originally 06/20/2023)   HEMOGLOBIN A1C  06/11/2024   Diabetic kidney evaluation - eGFR measurement  12/12/2024   Diabetic kidney evaluation - Urine ACR  12/12/2024   Medicare Annual Wellness (AWV)  01/29/2025   DTaP/Tdap/Td (2 - Td or Tdap) 02/23/2025   Pneumonia Vaccine 42+ Years old  Completed   Hepatitis C Screening  Completed   HPV VACCINES  Aged Out   Colonoscopy  Discontinued    Health Maintenance  Health Maintenance Due  Topic Date Due   Zoster Vaccines- Shingrix (1 of 2) Never done   DEXA SCAN  03/29/2018   COVID-19 Vaccine (3 - Pfizer risk series) 06/24/2020   MAMMOGRAM  09/09/2021   OPHTHALMOLOGY EXAM  03/05/2023   FOOT EXAM  12/01/2023   Health Maintenance Items Addressed: Mammogram ordered, DEXA ordered  Additional Screening:  Vision Screening: Recommended annual ophthalmology exams for early detection of glaucoma and other disorders of the eye.  Dental Screening: Recommended annual dental exams for proper oral hygiene  Community Resource Referral / Chronic Care Management: CRR required this visit?  No   CCM required this visit?  No     Plan:     I have personally reviewed and noted the following in the patient's chart:   Medical and social history Use of alcohol, tobacco or illicit drugs  Current medications and supplements including opioid prescriptions. Patient is not currently taking opioid prescriptions. Functional ability and status Nutritional status Physical activity Advanced  directives List of other physicians Hospitalizations, surgeries, and ER visits in previous 12 months Vitals Screenings to include cognitive, depression, and falls Referrals and appointments  In addition, I have reviewed and discussed with patient certain preventive protocols, quality metrics, and best practice recommendations. A written personalized care plan for preventive services as well as general preventive health recommendations were provided to patient.     Jordan Hawks Karey Suthers, CMA   01/30/2024   After Visit Summary: (Mail) Due to this being a telephonic visit, the after visit summary with patients personalized plan was offered to patient via mail   Notes: Please refer to Routing Comments.

## 2024-02-04 ENCOUNTER — Ambulatory Visit: Admitting: Cardiology

## 2024-02-12 NOTE — Telephone Encounter (Signed)
 Recd req for notes from Dr. Stacie Ac[facility] from [facility]. I faxed notes from 12/14/22 to [phone] and sent request for medical records to be sent to them as well

## 2024-02-18 ENCOUNTER — Ambulatory Visit
Admission: RE | Admit: 2024-02-18 | Discharge: 2024-02-18 | Disposition: A | Discharge status: Home / Self Care | Source: Ambulatory Visit | Attending: Nurse Practitioner | Admitting: Nurse Practitioner

## 2024-02-18 DIAGNOSIS — Z1231 Encounter for screening mammogram for malignant neoplasm of breast: Secondary | ICD-10-CM

## 2024-02-19 ENCOUNTER — Ambulatory Visit: Payer: 59 | Admitting: Cardiology

## 2024-02-19 ENCOUNTER — Other Ambulatory Visit: Payer: Self-pay | Admitting: Nurse Practitioner

## 2024-02-19 DIAGNOSIS — E1129 Type 2 diabetes mellitus with other diabetic kidney complication: Secondary | ICD-10-CM

## 2024-02-25 ENCOUNTER — Ambulatory Visit: Payer: 59 | Admitting: Cardiology

## 2024-02-27 ENCOUNTER — Ambulatory Visit: Attending: Cardiology | Admitting: Cardiology

## 2024-02-27 ENCOUNTER — Encounter: Payer: Self-pay | Admitting: Cardiology

## 2024-02-27 ENCOUNTER — Other Ambulatory Visit (HOSPITAL_BASED_OUTPATIENT_CLINIC_OR_DEPARTMENT_OTHER): Payer: Self-pay | Admitting: Cardiology

## 2024-02-27 VITALS — BP 124/56 | HR 68 | Ht 62.0 in | Wt 152.8 lb

## 2024-02-27 DIAGNOSIS — R072 Precordial pain: Secondary | ICD-10-CM

## 2024-02-27 DIAGNOSIS — I10 Essential (primary) hypertension: Secondary | ICD-10-CM

## 2024-02-27 DIAGNOSIS — G4733 Obstructive sleep apnea (adult) (pediatric): Secondary | ICD-10-CM | POA: Diagnosis not present

## 2024-02-27 LAB — TROPONIN T: Troponin T (Highly Sensitive): <6 ng/L (ref 0–14)

## 2024-02-27 MED ORDER — METOPROLOL TARTRATE 100 MG PO TABS: ORAL_TABLET | ORAL | 0 refills | Status: DC

## 2024-02-27 NOTE — Patient Instructions (Addendum)
 Medication Instructions:   *If you need a refill on your cardiac medications before your next appointment, please call your pharmacy*  Lab Work: TODAY.... ESR, CRP, TROPONIN, D DIMER  If you have labs (blood work) drawn today and your tests are completely normal, you will receive your results only by: MyChart Message (if you have MyChart) OR A paper copy in the mail If you have any lab test that is abnormal or we need to change your treatment, we will call you to review the results.  Testing/Procedures:    Your cardiac CT will be scheduled at one of the below locations:   [facility] [address] [place], [place] [address] [phone]   Saul Fordyce. [facility] [address]  [place], [place] [address] Opening March 16, 2024  If scheduled at [facility], please arrive at the [facility] and Children's Entrance (Entrance C2) of [facility] 30 minutes prior to test start time. You can use the FREE valet parking offered at entrance C (encouraged to control the heart rate for the test)  Proceed to the [facility] Radiology Department (first floor) to check-in and test prep.  All radiology patients and guests should use entrance C2 at [facility], accessed from [facility], even though the hospital's physical address listed is [address]    Please follow these instructions carefully (unless otherwise directed):  An IV will be required for this test and Nitroglycerin will be given.  Hold all erectile dysfunction medications at least 3 days (72 hrs) prior to test. (Ie viagra, cialis, sildenafil, tadalafil, etc)   On the Night Before the Test: Be sure to Drink plenty of water. Do not consume any caffeinated/decaffeinated beverages or chocolate 12 hours prior to your test. Do not take any antihistamines 12 hours prior to your test.  On the Day of the Test: Drink plenty of water until 1 hour prior to the  test. Do not eat any food 1 hour prior to test. You may take your regular medications prior to the test.  Take metoprolol (Lopressor) two hours prior to test. If you take Furosemide please HOLD on the morning of the test. Patients who wear a continuous glucose monitor MUST remove the device prior to scanning. FEMALES- please wear underwire-free bra if available, avoid dresses & tight clothing  After the Test: Drink plenty of water. After receiving IV contrast, you may experience a mild flushed feeling. This is normal. On occasion, you may experience a mild rash up to 24 hours after the test. This is not dangerous. If this occurs, you can take Benadryl 25 mg, Zyrtec, Claritin, or Allegra and increase your fluid intake. (Patients taking Tikosyn should avoid Benadryl, and may take Zyrtec, Claritin, or Allegra) If you experience trouble breathing, this can be serious. If it is severe call 911 IMMEDIATELY. If it is mild, please call our office.  We will call to schedule your test 2-4 weeks out understanding that some insurance companies will need an authorization prior to the service being performed.   For more information and frequently asked questions, please visit our website : [email or web address]  For non-scheduling related questions, please contact the cardiac imaging nurse navigator should you have any questions/concerns: Cardiac Imaging Nurse Navigators Direct Office Dial: [phone]   For scheduling needs, including cancellations and rescheduling, please call [place], [phone].   Your physician has requested that you have an echocardiogram. Echocardiography is a painless test that uses sound  waves to create images of your heart. It provides your doctor with information about the size and shape of your heart and how well your heart's chambers and valves are working. This procedure takes approximately one hour. There are no restrictions for this procedure. Please do  NOT wear cologne, perfume, aftershave, or lotions (deodorant is allowed). Please arrive 15 minutes prior to your appointment time.  Please note: We ask at that you not bring children with you during ultrasound (echo/ vascular) testing. Due to room size and safety concerns, children are not allowed in the ultrasound rooms during exams. Our front office staff cannot provide observation of children in our lobby area while testing is being conducted. An adult accompanying a patient to their appointment will only be allowed in the ultrasound room at the discretion of the ultrasound technician under special circumstances. We apologize for any inconvenience.   Follow-Up: At [facility] you and your health needs are our priority.  As part of our continuing mission to provide you with exceptional heart care, our providers are all part of one team.  This team includes your primary Cardiologist (physician) and Advanced Practice Providers or APPs (Physician Assistants and Nurse Practitioners) who all work together to provide you with the care you need, when you need it.  Your next appointment:  AS NEEDED    We recommend signing up for the patient portal called "MyChart".  Sign up information is provided on this After Visit Summary.  MyChart is used to connect with patients for Virtual Visits (Telemedicine).  Patients are able to view lab/test results, encounter notes, upcoming appointments, etc.  Non-urgent messages can be sent to your provider as well.   To learn more about what you can do with MyChart, go to [email or web address].   Other Instructions       1st Floor: - Lobby - Registration  - Pharmacy  - Lab - Cafe  2nd Floor: - PV Lab - Diagnostic Testing (echo, CT, nuclear med)  3rd Floor: - Vacant  4th Floor: - TCTS (cardiothoracic surgery) - AFib Clinic - Structural Heart Clinic - Vascular Surgery  - Vascular Ultrasound  5th Floor: - HeartCare Cardiology (general and  EP) - Clinical Pharmacy for coumadin, hypertension, lipid, weight-loss medications, and med management appointments    Valet parking services will be available as well.

## 2024-02-27 NOTE — Progress Notes (Signed)
 Cardiology Office Note:    Date:  02/27/2024   ID:  Beth Hubbard, Beth Hubbard 03-10-1946, MRN [id number]  PCP:  Ivonne Andrew, NP  Cardiologist:  None  Sleep Medicine:  Armanda Magic, MD  Referring MD: Ivonne Andrew, NP   Chief Complaint  Patient presents with   Sleep Apnea   Hypertension    History of Present Illness:    Beth Hubbard is a 78 y.o. female with a hx of mild OSA with an AHI of 11.9/hr with oxygen desaturations as low as 83% and is on CPAP at 15cm H2O. Unfortunately after a fall where she sustained fractures in some bones in her face and had to have a 7 hours reconstructive surgery and has not been able to tolerate the mask.   We repeated a sleep study 08/11/2022 showing no evidence of obstructive sleep apnea with an AHI of 4.8/h but did have severe snoring.  She is here today because she has been having sharp pains under her left breast as well as mid sternal. It hurts when she takes a deep breath in.  She is not SOB. The pain is worse when she is walking and feels a pain in her neck when she lays on her lsft side. She denies any dizziness, palpitations, LE edema or syncope.  Past Medical History:  Diagnosis Date   Arthritis    Diabetes mellitus without complication (HCC)    Excessive daytime sleepiness    Glaucoma    High cholesterol    Hyperlipemia    Hypertension    Mitral regurgitation    mild by echo 03/2017   OSA (obstructive sleep apnea) 10/18/2015   Mild OSA with AHI 12.5/hr on CPAP at 8cm H2O   Pneumonia    Shingles    TIA (transient ischemic attack)     Past Surgical History:  Procedure Laterality Date   ABDOMINAL SURGERY     BICEPT TENODESIS Left 01/11/2023   Procedure: BICEPS TENODESIS;  Surgeon: Jodi Geralds, MD;  Location: [facility];  Service: Orthopedics;  Laterality: Left;   CANTHOPLASTY Right 03/30/2022   Procedure: CANTHOPLASTY;  Surgeon: Dairl Ponder, MD;  Location: [facility] OR;  Service: Ophthalmology;  Laterality: Right;    EYE EXAMINATION UNDER ANESTHESIA Right 03/30/2022   Procedure: EYE EXAM UNDER ANESTHESIA;  Surgeon: Dairl Ponder, MD;  Location: [facility] OR;  Service: Ophthalmology;  Laterality: Right;   EYE SURGERY Bilateral    cataract surgery   ORIF ORBITAL FRACTURE Right 03/30/2022   Procedure: ORBITAL FLOOR FRACTURE REPAIR WITH IMPLANT;  Surgeon: Dairl Ponder, MD;  Location: [facility] OR;  Service: Ophthalmology;  Laterality: Right;   ROTATOR CUFF REPAIR     bilateral   SHOULDER ARTHROSCOPY WITH OPEN ROTATOR CUFF REPAIR AND DISTAL CLAVICLE ACROMINECTOMY Left 01/11/2023   Procedure: ARTHROSCOPY SHOULDER MINI OPEN ROTATOR CUFF REPAIR, SUBACROMIAL DECOMPRESSION AND DISTAL CLAVICLE RESECTION;  Surgeon: Jodi Geralds, MD;  Location: [facility];  Service: Orthopedics;  Laterality: Left;   TEE WITHOUT CARDIOVERSION N/A 07/20/2016   Procedure: TRANSESOPHAGEAL ECHOCARDIOGRAM (TEE);  Surgeon: Chrystie Nose, MD;  Location: [facility];  Service: Cardiovascular;  Laterality: N/A;   TOOTH EXTRACTION Left 10/09/2019   Procedure: DENTAL EXTRACTION X1 and Irrigation and Debridement.;  Surgeon: Ocie Doyne, DDS;  Location: [facility] OR;  Service: Oral Surgery;  Laterality: Left;  DENTAL EXTRACTION X1 and Irrigation and Debridement.    Current Medications: Current Meds  Medication Sig   ACCU-CHEK AVIVA PLUS test strip Reported on 02/24/2016  Accu-Chek Softclix Lancets lancets Check BS once or twice a day.   acetaminophen (TYLENOL) 500 MG tablet Take 500 mg by mouth every 6 (six) hours as needed.   amLODipine (NORVASC) 10 MG tablet TAKE 1 TABLET(10 MG) BY MOUTH DAILY   blood glucose meter kit and supplies KIT Dispense based on patient and insurance preference. Use up to four times daily as directed.   Blood Pressure Monitor KIT 1 kit by Does not apply route daily.   brimonidine-timolol (COMBIGAN) 0.2-0.5 % ophthalmic solution Place 1 drop into both eyes 2 (two) times daily.   cetirizine (ZYRTEC) 10 MG tablet Take 1  tablet (10 mg total) by mouth daily.   dorzolamide (TRUSOPT) 2 % ophthalmic solution Place 1 drop into both eyes 2 (two) times daily.   Doxylamine Succinate, Sleep, (SLEEP AID PO) Take by mouth.   gabapentin (NEURONTIN) 300 MG capsule TAKE 1 CAPSULE BY MOUTH EVERY NIGHT AT BEDTIME   hydrOXYzine (VISTARIL) 25 MG capsule Take 1 capsule (25 mg total) by mouth at bedtime as needed.   rosuvastatin (CRESTOR) 20 MG tablet TAKE 1 TABLET(20 MG) BY MOUTH DAILY   sitaGLIPtin (JANUVIA) 50 MG tablet TAKE 1 TABLET(50 MG) BY MOUTH DAILY   sodium chloride (MURO 128) 5 % ophthalmic solution Place 1 drop into both eyes as needed for eye irritation.   traZODone (DESYREL) 100 MG tablet TAKE 1 TABLET(100 MG) BY MOUTH AT BEDTIME   valsartan (DIOVAN) 80 MG tablet Take 1 tablet (80 mg total) by mouth daily.     Allergies:   Aspirin, Aspirin, Ibuprofen, Penicillins, Penicillins, and Nsaids   Social History   Socioeconomic History   Marital status: Legally Separated    Spouse name: Not on file   Number of children: Not on file   Years of education: Not on file   Highest education level: Not on file  Occupational History   Not on file  Tobacco Use   Smoking status: Never   Smokeless tobacco: Never  Vaping Use   Vaping status: Never Used  Substance and Sexual Activity   Alcohol use: Never   Drug use: Never   Sexual activity: Not Currently  Other Topics Concern   Not on file  Social History Narrative   ** Merged History Encounter **       Social Drivers of Health   Financial Resource Strain: Low Risk  (01/30/2024)   Overall Financial Resource Strain (CARDIA)    Difficulty of Paying Living Expenses: Not hard at all  Food Insecurity: No Food Insecurity (01/30/2024)   Hunger Vital Sign    Worried About Running Out of Food in the Last Year: Never true    Ran Out of Food in the Last Year: Never true  Transportation Needs: No Transportation Needs (01/30/2024)   PRAPARE - Scientist, research (physical sciences) (Medical): No    Lack of Transportation (Non-Medical): No  Physical Activity: Sufficiently Active (01/30/2024)   Exercise Vital Sign    Days of Exercise per Week: 7 days    Minutes of Exercise per Session: 30 min  Recent Concern: Physical Activity - Insufficiently Active (12/13/2023)   Exercise Vital Sign    Days of Exercise per Week: 2 days    Minutes of Exercise per Session: 20 min  Stress: No Stress Concern Present (01/30/2024)   [employer] of Occupational Health - Occupational Stress Questionnaire    Feeling of Stress : Not at all  Social Connections: Moderately Integrated (01/30/2024)   Social  Connection and Isolation Panel [NHANES]    Frequency of Communication with Friends and Family: More than three times a week    Frequency of Social Gatherings with Friends and Family: More than three times a week    Attends Religious Services: More than 4 times per year    Active Member of [employer] or Organizations: Yes    Attends Engineer, structural: More than 4 times per year    Marital Status: Separated     Family History: The patient's family history includes Cancer in her brother, brother, father, sister, and sister. There is no history of Breast cancer.  ROS:   Please see the history of present illness.    ROS  All other systems reviewed and negative.   EKGs/Labs/Other Studies Reviewed:    EKG Interpretation Date/Time:  Thursday February 27 2024 09:15:18 EDT Ventricular Rate:  70 PR Interval:  164 QRS Duration:  84 QT Interval:  386 QTC Calculation: 416 R Axis:   29  Text Interpretation: Normal sinus rhythm Normal ECG When compared with ECG of 24-Dec-2023 08:23, No significant change was found Confirmed by Armanda Magic ([id number]) on 02/27/2024 9:22:23 AM     Recent Labs: 12/13/2023: ALT 9; BUN 11; Creatinine, Ser 0.83; Hemoglobin 11.3; Platelets 239; Potassium 4.4; Sodium 145   Recent Lipid Panel    Component Value Date/Time   CHOL 174 12/13/2023  1043   TRIG 128 12/13/2023 1043   HDL 53 12/13/2023 1043   CHOLHDL 3.3 12/13/2023 1043   CHOLHDL 2.7 04/08/2017 0909   VLDL 27 04/08/2017 0909   LDLCALC 98 12/13/2023 1043    Physical Exam:    VS:  BP (!) 124/56   Pulse 68   Ht 5\' 2"  (1.575 m)   Wt 152 lb 12.8 oz (69.3 kg)   SpO2 98%   BMI 27.95 kg/m     Wt Readings from Last 3 Encounters:  02/27/24 152 lb 12.8 oz (69.3 kg)  01/30/24 150 lb (68 kg)  12/24/23 151 lb (68.5 kg)    GEN: Well nourished, well developed in no acute distress HEENT: Normal NECK: No JVD; No carotid bruits LYMPHATICS: No lymphadenopathy CARDIAC:RRR, no murmurs, rubs, gallops RESPIRATORY:  Clear to auscultation without rales, wheezing or rhonchi  ABDOMEN: Soft, non-tender, non-distended MUSCULOSKELETAL:  No edema; No deformity  SKIN: Warm and dry NEUROLOGIC:  Alert and oriented x 3 PSYCHIATRIC:  Normal affect  ASSESSMENT:    1. OSA (obstructive sleep apnea)   2. Essential hypertension      PLAN:    In order of problems listed above:  1.  OSA  -she was not able to use her CPAP  due to a fall with fracture facial bones involving her eye requiring extensive surgery -We repeated a sleep study 08/11/2022 showing no evidence of obstructive sleep apnea with an AHI of 4.8/h but did have severe snoring.  2.  HTN -BP controlled on exam today -She will continue prescription drug management with amlodipine 10 mg daily and valsartan 80 mg daily with as needed refills -I have personally reviewed and interpreted outside labs performed by patient's PCP which showed serum creatinine 0.83 and potassium 4.4 on 12/13/2023  3.  Chest pain -it sound pleuritic in nature.  No real change from sitting or lying down.  She had a cold about 3 weeks ago.   -Suspect this is MSK but need to rule out acute pericarditis or PE -check DDimer, sed rate and CRP, hxTrop -will get a coronary CTA  to define coronary anatomy -check 2D echo to assess LVF and rule out pericardial  effusion    Medication Adjustments/Labs and Tests Ordered: Current medicines are reviewed at length with the patient today.  Concerns regarding medicines are outlined above.  No orders of the defined types were placed in this encounter.  No orders of the defined types were placed in this encounter.   Signed, Armanda Magic, MD  02/27/2024 9:05 AM    [facility]

## 2024-02-27 NOTE — Addendum Note (Signed)
 Addended by: Bertram Millard on: 02/27/2024 09:29 AM   Modules accepted: Orders

## 2024-02-28 ENCOUNTER — Telehealth: Payer: Self-pay

## 2024-02-28 DIAGNOSIS — R7989 Other specified abnormal findings of blood chemistry: Secondary | ICD-10-CM

## 2024-02-28 LAB — SEDIMENTATION RATE: Sed Rate: 16 mm/h (ref 0–40)

## 2024-02-28 LAB — D-DIMER, QUANTITATIVE: D-DIMER: 0.81 mg{FEU}/L — ABNORMAL HIGH (ref 0.00–0.49)

## 2024-02-28 LAB — HIGH SENSITIVITY CRP: CRP, High Sensitivity: 2.12 mg/L (ref 0.00–3.00)

## 2024-02-28 NOTE — Telephone Encounter (Signed)
-----   Message from Armanda Magic sent at 02/28/2024 10:45 AM EDT ----- D-dimer is mildly elevated so I would like to have her get a chest CT with contrast to rule out PE

## 2024-02-28 NOTE — Telephone Encounter (Signed)
 Spoke with patient and discussed lab results and recommendation from Dr. Mayford Knife:  D-dimer is mildly elevated so I would like to have her get a chest CT with contrast to rule out PE    Chest CT with contrast ordered. Scheduler to contact patient to schedule appt for CT. Patient verbalized understanding.

## 2024-03-12 ENCOUNTER — Encounter (HOSPITAL_COMMUNITY): Payer: Self-pay

## 2024-03-12 ENCOUNTER — Other Ambulatory Visit: Payer: Self-pay

## 2024-03-12 DIAGNOSIS — Z01812 Encounter for preprocedural laboratory examination: Secondary | ICD-10-CM

## 2024-03-12 NOTE — Progress Notes (Signed)
 Pre-procedure lab for cardiac CT scheduled for 03/17/24.

## 2024-03-13 ENCOUNTER — Encounter: Payer: Self-pay | Admitting: Nurse Practitioner

## 2024-03-13 ENCOUNTER — Ambulatory Visit (INDEPENDENT_AMBULATORY_CARE_PROVIDER_SITE_OTHER): Payer: Self-pay | Admitting: Nurse Practitioner

## 2024-03-13 VITALS — BP 153/62 | HR 66 | Temp 98.1°F | Wt 151.6 lb

## 2024-03-13 DIAGNOSIS — N390 Urinary tract infection, site not specified: Secondary | ICD-10-CM

## 2024-03-13 DIAGNOSIS — R350 Frequency of micturition: Secondary | ICD-10-CM

## 2024-03-13 DIAGNOSIS — E119 Type 2 diabetes mellitus without complications: Secondary | ICD-10-CM

## 2024-03-13 LAB — POCT URINALYSIS DIP (CLINITEK)
Bilirubin, UA: NEGATIVE
Glucose, UA: NEGATIVE mg/dL
Ketones, POC UA: NEGATIVE mg/dL
Nitrite, UA: NEGATIVE
POC PROTEIN,UA: NEGATIVE
Spec Grav, UA: 1.02 (ref 1.010–1.025)
Urobilinogen, UA: 0.2 U/dL
pH, UA: 7 (ref 5.0–8.0)

## 2024-03-13 LAB — POCT GLYCOSYLATED HEMOGLOBIN (HGB A1C): Hemoglobin A1C: 5.8 % — AB (ref 4.0–5.6)

## 2024-03-13 MED ORDER — SULFAMETHOXAZOLE-TRIMETHOPRIM 800-160 MG PO TABS: 1.0000 | ORAL_TABLET | Freq: Two times a day (BID) | ORAL | 0 refills | Status: AC

## 2024-03-13 NOTE — Progress Notes (Signed)
 Subjective   Patient ID: Beth Hubbard, female    DOB: 09-03-1946, 78 y.o.   MRN: [id number]  Chief Complaint  Patient presents with   Back Pain    Patient stated that her lower back has been hurting for a bout a week now    Referring provider: Jerrlyn Morel, NP  Beth Hubbard is a 78 y.o. female with Past Medical History: No date: Arthritis No date: Diabetes mellitus without complication (HCC) No date: Excessive daytime sleepiness No date: Glaucoma No date: High cholesterol No date: Hyperlipemia No date: Hypertension No date: Mitral regurgitation     Comment:  mild by echo 03/2017 10/18/2015: OSA (obstructive sleep apnea)     Comment:  Mild OSA with AHI 12.5/hr on CPAP at 8cm H2O No date: Pneumonia No date: Shingles No date: TIA (transient ischemic attack)   HPI  Beth Hubbard is a 78 y.o. female who presents for follow-up of Type 2 diabetes mellitus.   Patient is not checking home blood sugars.   Home blood sugar records:  How often is blood sugars being checked: not checking Current symptoms/problems include none and have been stable. Last eye exam: within the past year Exercise: The patient does not participate in regular exercise at present.   A1C today is 5.8  Patient does complain today of low back pain.  She has discussed this with orthopedic surgery and they recommended UA in office today.  BP elevated in office - forgot to take BP meds today   Denies f/c/s, n/v/d, hemoptysis, PND, leg swelling Denies chest pain or edema   Allergies  Allergen Reactions   Aspirin      Tremor    Aspirin  Other (See Comments)    Ringing in Ear   Ibuprofen Other (See Comments)    Ringing in Ear   Penicillins Itching and Other (See Comments)    Reaction: itching,headache, dizziness and anxiety. Feels like she is "goin off" Did it involve swelling of the face/tongue/throat, SOB, or low BP? No Did it involve sudden or severe rash/hives, skin peeling, or any reaction  on the inside of your mouth or nose? No Did you need to seek medical attention at a hospital or doctor's office? No When did it last happen?      Childhood If all above answers are "NO", may proceed with cephalosporin use.   Penicillins Other (See Comments)    Patient fainted   Nsaids Anxiety    Reaction: causes dizziness and headache. Ibuprofen. Aspirin     Immunization History  Administered Date(s) Administered   Influenza, High Dose Seasonal PF 09/23/2018   Influenza,inj,Quad PF,6+ Mos 09/23/2020   Influenza-Unspecified 06/19/2013   PFIZER(Purple Top)SARS-COV-2 Vaccination 04/28/2020, 05/27/2020   Pneumococcal Conjugate-13 02/24/2015   Pneumococcal Polysaccharide-23 03/05/2016   Tdap 02/24/2015    Tobacco History: Social History   Tobacco Use  Smoking Status Never  Smokeless Tobacco Never   Counseling given: Not Answered   Outpatient Encounter Medications as of 03/13/2024  Medication Sig   ACCU-CHEK AVIVA PLUS test strip Reported on 02/24/2016   Accu-Chek Softclix Lancets lancets Check BS once or twice a day.   acetaminophen  (TYLENOL ) 500 MG tablet Take 500 mg by mouth every 6 (six) hours as needed.   amLODipine  (NORVASC ) 10 MG tablet TAKE 1 TABLET(10 MG) BY MOUTH DAILY   blood glucose meter kit and supplies KIT Dispense based on patient and insurance preference. Use up to four times daily as directed.   Blood Pressure Monitor KIT  1 kit by Does not apply route daily.   brimonidine-timolol (COMBIGAN) 0.2-0.5 % ophthalmic solution Place 1 drop into both eyes 2 (two) times daily.   cetirizine  (ZYRTEC ) 10 MG tablet Take 1 tablet (10 mg total) by mouth daily.   dorzolamide  (TRUSOPT ) 2 % ophthalmic solution Place 1 drop into both eyes 2 (two) times daily.   Doxylamine Succinate, Sleep, (SLEEP AID PO) Take by mouth.   gabapentin  (NEURONTIN ) 300 MG capsule TAKE 1 CAPSULE BY MOUTH EVERY NIGHT AT BEDTIME   hydrOXYzine  (VISTARIL ) 25 MG capsule Take 1 capsule (25 mg total) by mouth at  bedtime as needed.   metoprolol  tartrate (LOPRESSOR ) 100 MG tablet Take on tablet 2 hours prior to your cardiac CT   rosuvastatin  (CRESTOR ) 20 MG tablet TAKE 1 TABLET(20 MG) BY MOUTH DAILY   sitaGLIPtin  (JANUVIA ) 50 MG tablet TAKE 1 TABLET(50 MG) BY MOUTH DAILY   sodium chloride  (MURO 128) 5 % ophthalmic solution Place 1 drop into both eyes as needed for eye irritation.   sulfamethoxazole -trimethoprim  (BACTRIM  DS) 800-160 MG tablet Take 1 tablet by mouth 2 (two) times daily for 3 days.   traZODone  (DESYREL ) 100 MG tablet TAKE 1 TABLET(100 MG) BY MOUTH AT BEDTIME   valsartan  (DIOVAN ) 80 MG tablet Take 1 tablet (80 mg total) by mouth daily.   No facility-administered encounter medications on file as of 03/13/2024.    Review of Systems  Review of Systems  Constitutional: Negative.   HENT: Negative.    Cardiovascular: Negative.   Gastrointestinal: Negative.   Allergic/Immunologic: Negative.   Neurological: Negative.   Psychiatric/Behavioral: Negative.       Objective:   BP (!) 153/62   Pulse 66   Temp 98.1 F (36.7 C) (Oral)   Wt 151 lb 9.6 oz (68.8 kg)   SpO2 98%   BMI 27.73 kg/m   Wt Readings from Last 5 Encounters:  03/13/24 151 lb 9.6 oz (68.8 kg)  02/27/24 152 lb 12.8 oz (69.3 kg)  01/30/24 150 lb (68 kg)  12/24/23 151 lb (68.5 kg)  12/13/23 153 lb (69.4 kg)     Physical Exam Vitals and nursing note reviewed.  Constitutional:      General: She is not in acute distress.    Appearance: She is well-developed.  Cardiovascular:     Rate and Rhythm: Normal rate and regular rhythm.  Pulmonary:     Effort: Pulmonary effort is normal.     Breath sounds: Normal breath sounds.  Neurological:     Mental Status: She is alert and oriented to person, place, and time.       Assessment & Plan:   Urine frequency -     POCT URINALYSIS DIP (CLINITEK)  Diabetes mellitus without complication (HCC) -     POCT glycosylated hemoglobin (Hb A1C) -     Basic metabolic panel  with GFR -     CBC  Urinary tract infection without hematuria, site unspecified -     Sulfamethoxazole -Trimethoprim ; Take 1 tablet by mouth 2 (two) times daily for 3 days.  Dispense: 6 tablet; Refill: 0 -     Urine Culture     Return in about 3 months (around 06/12/2024).   Jerrlyn Morel, NP 03/13/2024

## 2024-03-13 NOTE — Patient Instructions (Signed)
 1. Urine frequency (Primary)  - POCT URINALYSIS DIP (CLINITEK)  2. Diabetes mellitus without complication (HCC)  - POCT glycosylated hemoglobin (Hb A1C) - Basic Metabolic Panel - CBC

## 2024-03-14 LAB — CBC
Hematocrit: 35 % (ref 34.0–46.6)
Hemoglobin: 11.3 g/dL (ref 11.1–15.9)
MCH: 31.1 pg (ref 26.6–33.0)
MCHC: 32.3 g/dL (ref 31.5–35.7)
MCV: 96 fL (ref 79–97)
Platelets: 210 10*3/uL (ref 150–450)
RBC: 3.63 x10E6/uL — ABNORMAL LOW (ref 3.77–5.28)
RDW: 13.6 % (ref 11.7–15.4)
WBC: 6.4 10*3/uL (ref 3.4–10.8)

## 2024-03-14 LAB — BASIC METABOLIC PANEL WITH GFR
BUN/Creatinine Ratio: 15 (ref 12–28)
BUN: 14 mg/dL (ref 8–27)
CO2: 26 mmol/L (ref 20–29)
Calcium: 9.8 mg/dL (ref 8.7–10.3)
Chloride: 101 mmol/L (ref 96–106)
Creatinine, Ser: 0.96 mg/dL (ref 0.57–1.00)
Glucose: 95 mg/dL (ref 70–99)
Potassium: 4.5 mmol/L (ref 3.5–5.2)
Sodium: 145 mmol/L — ABNORMAL HIGH (ref 134–144)
eGFR: 61 mL/min/{1.73_m2} (ref >59)

## 2024-03-15 LAB — URINE CULTURE

## 2024-03-16 ENCOUNTER — Telehealth: Payer: Self-pay | Admitting: Cardiology

## 2024-03-16 ENCOUNTER — Telehealth (HOSPITAL_COMMUNITY): Payer: Self-pay | Admitting: *Deleted

## 2024-03-16 NOTE — Telephone Encounter (Signed)
 New Message:       Patient is having  Cardiac CT tomorrow morning Daughter wants to know if patient can take her blood pressure medicine?

## 2024-03-16 NOTE — Telephone Encounter (Signed)
 Attempted to call patient x2, no answer left message.   Mychart message Also sent.

## 2024-03-16 NOTE — Telephone Encounter (Addendum)
 Reaching out to patient to offer assistance regarding upcoming cardiac imaging study; spoke to daughter (ok per DPR) who verbalizes understanding of appt date/time, parking situation and where to check in, pre-test NPO status and medications ordered, and verified current allergies; name and call back number provided for further questions should they arise Kerri Peed RN Navigator Cardiac Imaging [facility] [phone] office [phone] cell

## 2024-03-17 ENCOUNTER — Ambulatory Visit (HOSPITAL_COMMUNITY)
Admission: RE | Admit: 2024-03-17 | Discharge: 2024-03-17 | Disposition: A | Discharge status: Home / Self Care | Source: Ambulatory Visit | Attending: Internal Medicine | Admitting: Internal Medicine

## 2024-03-17 ENCOUNTER — Other Ambulatory Visit: Payer: Self-pay | Admitting: Cardiology

## 2024-03-17 ENCOUNTER — Ambulatory Visit (HOSPITAL_COMMUNITY)
Admission: RE | Admit: 2024-03-17 | Discharge: 2024-03-17 | Disposition: A | Discharge status: Home / Self Care | Source: Ambulatory Visit | Attending: Cardiology | Admitting: Cardiology

## 2024-03-17 DIAGNOSIS — R931 Abnormal findings on diagnostic imaging of heart and coronary circulation: Secondary | ICD-10-CM | POA: Insufficient documentation

## 2024-03-17 DIAGNOSIS — G4733 Obstructive sleep apnea (adult) (pediatric): Secondary | ICD-10-CM | POA: Insufficient documentation

## 2024-03-17 DIAGNOSIS — R072 Precordial pain: Secondary | ICD-10-CM | POA: Insufficient documentation

## 2024-03-17 DIAGNOSIS — I10 Essential (primary) hypertension: Secondary | ICD-10-CM | POA: Diagnosis present

## 2024-03-17 DIAGNOSIS — I2510 Atherosclerotic heart disease of native coronary artery without angina pectoris: Secondary | ICD-10-CM | POA: Diagnosis not present

## 2024-03-17 MED ORDER — METOPROLOL TARTRATE 5 MG/5ML IV SOLN: 10.0000 mg | Freq: Once | INTRAVENOUS | Status: DC | PRN

## 2024-03-17 MED ORDER — NITROGLYCERIN 0.4 MG SL SUBL
SUBLINGUAL_TABLET | SUBLINGUAL | Status: AC
  Filled 2024-03-17: qty 2

## 2024-03-17 MED ORDER — DILTIAZEM HCL 25 MG/5ML IV SOLN: 10.0000 mg | INTRAVENOUS | Status: DC | PRN

## 2024-03-17 MED ORDER — NITROGLYCERIN 0.4 MG SL SUBL
0.8000 mg | SUBLINGUAL_TABLET | Freq: Once | SUBLINGUAL | Status: AC
  Administered 2024-03-17: 0.8 mg via SUBLINGUAL

## 2024-03-17 MED ORDER — IOHEXOL 350 MG/ML SOLN
100.0000 mL | Freq: Once | INTRAVENOUS | Status: AC | PRN
  Administered 2024-03-17: 100 mL via INTRAVENOUS

## 2024-03-21 ENCOUNTER — Other Ambulatory Visit: Payer: Self-pay | Admitting: Nurse Practitioner

## 2024-03-21 DIAGNOSIS — R809 Proteinuria, unspecified: Secondary | ICD-10-CM

## 2024-03-23 ENCOUNTER — Telehealth: Payer: Self-pay

## 2024-03-23 DIAGNOSIS — E785 Hyperlipidemia, unspecified: Secondary | ICD-10-CM

## 2024-03-23 MED ORDER — ROSUVASTATIN CALCIUM 40 MG PO TABS: 40.0000 mg | ORAL_TABLET | Freq: Every day | ORAL | 3 refills | Status: DC

## 2024-03-23 NOTE — Telephone Encounter (Signed)
-----   Message from Nurse Aneta Bar sent at 03/18/2024  8:34 AM EDT -----  ----- Message ----- From: Jacqueline Matsu, MD Sent: 03/17/2024   4:52 PM EDT To: [facility] Triage  Coronary CTA showed a coronary calcium  score of 164 with 50 to 69% prox LAD, <25% prox LCx/RCA.  FFR concerning for reduction in flow in the proximal LAD.  Cardiac catheterization recommended.  Please get patient to come in to see an extender this week to get set up for left heart catheterization and possible PCI.  Patient cannot take aspirin due to history of tinnitus.  Last LDL was 100 and goal is <70.  Increase Crestor  to 40 mg daily and check FLP and ALT in 6 weeks..  Also please call radiology and tell them we needed the read on the noncardiac portion of her recent CT coronary morphology to rule out PE

## 2024-03-23 NOTE — Telephone Encounter (Signed)
 Radiology was called. They plan to take a look at the scan tomorrow 5/6 and to send Dr. Micael Adas the results of the noncardiac portion of the CTA.

## 2024-03-23 NOTE — Telephone Encounter (Signed)
 Spoke with pt's daughter (per DPR) regarding her results and Dr. Charl Concha suggestions. Crestor  was increased to 40 mg. Labs ordered for 6 weeks out and released. A message was sent to scheduling to find the pt an appointment this week. Pt's daughter verbalized understanding. All questions if any were answered.

## 2024-03-25 ENCOUNTER — Other Ambulatory Visit: Payer: Self-pay

## 2024-03-25 ENCOUNTER — Telehealth: Payer: Self-pay

## 2024-03-25 MED ORDER — CLOPIDOGREL BISULFATE 75 MG PO TABS: 75.0000 mg | ORAL_TABLET | Freq: Every day | ORAL | 3 refills | Status: AC

## 2024-03-25 NOTE — Progress Notes (Signed)
 Per Dr. Nolan Battle Plavix 75 mg once daily was ordered.

## 2024-03-25 NOTE — Telephone Encounter (Signed)
-----   Message from Gaylyn Keas sent at 03/23/2024  8:12 PM EDT ----- Was patient set up with extender this week to get set up for cath

## 2024-03-25 NOTE — Telephone Encounter (Signed)
 Per Dr. Micael Adas, will call today to discuss cath

## 2024-03-25 NOTE — Telephone Encounter (Signed)
 Spoke with pt's daughter (per DPR) regarding cath. Daughter aware of cath scheduled 5/12 with Dr. Nolan Battle. Pt is allergic to aspirin. Per Dr. Nolan Battle we will start her on plavix 75 mg daily. Prescription ordered. Daughter aware. Daughter aware of cath instructions being sent via MyChart. Daughter verbalized understanding. All questions if any were answered.

## 2024-03-26 ENCOUNTER — Telehealth: Payer: Self-pay | Admitting: *Deleted

## 2024-03-26 NOTE — Telephone Encounter (Signed)
 Cardiac Catheterization scheduled at [facility] [id number] Mar 30, 2024 9 AM Arrival time [facility] Main Entrance A at: 7 AM  Nothing to eat after midnight prior to procedure, clear liquids until 5 AM day of procedure.  Medication instructions: -Hold:  Januvia -AM of procedure -Other usual morning medications can be taken with sips of water  including Plavix 75 mg.   Pt's daughter tells me she will not be able to get Plavix until 03/27/24-knows pt should begin taking as soon as she gets it.  Plan to go home the same day, you will only stay overnight if medically necessary.  You must have responsible adult to drive you home.  Someone must be with you the first 24 hours after you arrive home.  Reviewed procedure instructions with patient's daughter, Eveleen Hinds.

## 2024-03-30 ENCOUNTER — Other Ambulatory Visit: Payer: Self-pay

## 2024-03-30 ENCOUNTER — Encounter (HOSPITAL_COMMUNITY)
Admission: RE | Disposition: A | Discharge status: Home / Self Care | Payer: Self-pay | Source: Home / Self Care | Attending: Internal Medicine

## 2024-03-30 ENCOUNTER — Ambulatory Visit (HOSPITAL_COMMUNITY)
Admission: RE | Admit: 2024-03-30 | Discharge: 2024-03-30 | Disposition: A | Discharge status: Home / Self Care | Attending: Internal Medicine | Admitting: Internal Medicine

## 2024-03-30 ENCOUNTER — Encounter (HOSPITAL_COMMUNITY): Payer: Self-pay | Admitting: Internal Medicine

## 2024-03-30 DIAGNOSIS — E119 Type 2 diabetes mellitus without complications: Secondary | ICD-10-CM | POA: Insufficient documentation

## 2024-03-30 DIAGNOSIS — R931 Abnormal findings on diagnostic imaging of heart and coronary circulation: Secondary | ICD-10-CM | POA: Diagnosis present

## 2024-03-30 DIAGNOSIS — Z7984 Long term (current) use of oral hypoglycemic drugs: Secondary | ICD-10-CM | POA: Insufficient documentation

## 2024-03-30 DIAGNOSIS — I2510 Atherosclerotic heart disease of native coronary artery without angina pectoris: Secondary | ICD-10-CM | POA: Insufficient documentation

## 2024-03-30 DIAGNOSIS — Z79899 Other long term (current) drug therapy: Secondary | ICD-10-CM | POA: Diagnosis not present

## 2024-03-30 DIAGNOSIS — G4733 Obstructive sleep apnea (adult) (pediatric): Secondary | ICD-10-CM | POA: Diagnosis not present

## 2024-03-30 DIAGNOSIS — Z8673 Personal history of transient ischemic attack (TIA), and cerebral infarction without residual deficits: Secondary | ICD-10-CM | POA: Insufficient documentation

## 2024-03-30 DIAGNOSIS — I10 Essential (primary) hypertension: Secondary | ICD-10-CM | POA: Diagnosis not present

## 2024-03-30 DIAGNOSIS — Z7902 Long term (current) use of antithrombotics/antiplatelets: Secondary | ICD-10-CM | POA: Diagnosis not present

## 2024-03-30 DIAGNOSIS — E785 Hyperlipidemia, unspecified: Secondary | ICD-10-CM | POA: Insufficient documentation

## 2024-03-30 HISTORY — PX: LEFT HEART CATH AND CORONARY ANGIOGRAPHY: CATH118249

## 2024-03-30 LAB — GLUCOSE, CAPILLARY: Glucose-Capillary: 135 mg/dL — ABNORMAL HIGH (ref 70–99)

## 2024-03-30 SURGERY — LEFT HEART CATH AND CORONARY ANGIOGRAPHY
Anesthesia: LOCAL

## 2024-03-30 MED ORDER — LIDOCAINE HCL (PF) 1 % IJ SOLN
INTRAMUSCULAR | Status: AC
  Filled 2024-03-30: qty 30

## 2024-03-30 MED ORDER — SODIUM CHLORIDE 0.9 % WEIGHT BASED INFUSION: 1.0000 mL/kg/h | INTRAVENOUS | Status: DC

## 2024-03-30 MED ORDER — MIDAZOLAM HCL 2 MG/2ML IJ SOLN
INTRAMUSCULAR | Status: DC | PRN
  Administered 2024-03-30: 1 mg via INTRAVENOUS

## 2024-03-30 MED ORDER — LIDOCAINE HCL (PF) 1 % IJ SOLN
INTRAMUSCULAR | Status: DC | PRN
  Administered 2024-03-30: 2 mL

## 2024-03-30 MED ORDER — IOHEXOL 350 MG/ML SOLN
INTRAVENOUS | Status: DC | PRN
  Administered 2024-03-30: 40 mL

## 2024-03-30 MED ORDER — VERAPAMIL HCL 2.5 MG/ML IV SOLN
INTRAVENOUS | Status: DC | PRN
  Administered 2024-03-30 (×2): 10 mL via INTRA_ARTERIAL

## 2024-03-30 MED ORDER — SODIUM CHLORIDE 0.9 % IV SOLN: 250.0000 mL | INTRAVENOUS | Status: DC | PRN

## 2024-03-30 MED ORDER — HEPARIN SODIUM (PORCINE) 1000 UNIT/ML IJ SOLN
INTRAMUSCULAR | Status: AC
Start: 2024-03-30 — End: ?
  Filled 2024-03-30: qty 10

## 2024-03-30 MED ORDER — ASPIRIN 81 MG PO TBEC: 81.0000 mg | DELAYED_RELEASE_TABLET | Freq: Every day | ORAL | Status: DC

## 2024-03-30 MED ORDER — CLOPIDOGREL BISULFATE 75 MG PO TABS: 75.0000 mg | ORAL_TABLET | ORAL | Status: DC

## 2024-03-30 MED ORDER — FENTANYL CITRATE (PF) 100 MCG/2ML IJ SOLN
INTRAMUSCULAR | Status: AC
  Filled 2024-03-30: qty 2

## 2024-03-30 MED ORDER — SODIUM CHLORIDE 0.9% FLUSH: 3.0000 mL | Freq: Two times a day (BID) | INTRAVENOUS | Status: DC

## 2024-03-30 MED ORDER — LABETALOL HCL 5 MG/ML IV SOLN: 10.0000 mg | INTRAVENOUS | Status: DC | PRN

## 2024-03-30 MED ORDER — HEPARIN SODIUM (PORCINE) 1000 UNIT/ML IJ SOLN
INTRAMUSCULAR | Status: DC | PRN
  Administered 2024-03-30: 3500 [IU] via INTRAVENOUS

## 2024-03-30 MED ORDER — ACETAMINOPHEN 325 MG PO TABS
650.0000 mg | ORAL_TABLET | ORAL | Status: DC | PRN
  Administered 2024-03-30: 650 mg via ORAL
  Filled 2024-03-30: qty 2

## 2024-03-30 MED ORDER — SODIUM CHLORIDE 0.9% FLUSH
3.0000 mL | INTRAVENOUS | Status: DC | PRN
Start: 2024-03-30 — End: 2024-03-30

## 2024-03-30 MED ORDER — MIDAZOLAM HCL 2 MG/2ML IJ SOLN
INTRAMUSCULAR | Status: AC
  Filled 2024-03-30: qty 2

## 2024-03-30 MED ORDER — HEPARIN (PORCINE) IN NACL 1000-0.9 UT/500ML-% IV SOLN
INTRAVENOUS | Status: DC | PRN
  Administered 2024-03-30 (×2): 500 mL

## 2024-03-30 MED ORDER — FENTANYL CITRATE (PF) 100 MCG/2ML IJ SOLN
INTRAMUSCULAR | Status: DC | PRN
  Administered 2024-03-30: 25 ug via INTRAVENOUS

## 2024-03-30 MED ORDER — SODIUM CHLORIDE 0.9 % IV SOLN: INTRAVENOUS | Status: DC

## 2024-03-30 MED ORDER — SODIUM CHLORIDE 0.9 % WEIGHT BASED INFUSION: 3.0000 mL/kg/h | INTRAVENOUS | Status: AC

## 2024-03-30 MED ORDER — METOPROLOL SUCCINATE ER 25 MG PO TB24: 25.0000 mg | ORAL_TABLET | Freq: Every day | ORAL | 11 refills | Status: AC

## 2024-03-30 MED ORDER — VERAPAMIL HCL 2.5 MG/ML IV SOLN
INTRAVENOUS | Status: AC
  Filled 2024-03-30: qty 2

## 2024-03-30 MED ORDER — HYDRALAZINE HCL 20 MG/ML IJ SOLN: 10.0000 mg | INTRAMUSCULAR | Status: DC | PRN

## 2024-03-30 MED ORDER — ONDANSETRON HCL 4 MG/2ML IJ SOLN: 4.0000 mg | Freq: Four times a day (QID) | INTRAMUSCULAR | Status: DC | PRN

## 2024-03-30 SURGICAL SUPPLY — 6 items
CATH 5FR JL3.5 JR4 ANG PIG MP (CATHETERS) IMPLANT
DEVICE RAD COMP TR BAND LRG (VASCULAR PRODUCTS) IMPLANT
GLIDESHEATH SLEND SS 6F .021 (SHEATH) IMPLANT
GUIDEWIRE INQWIRE 1.5J.035X260 (WIRE) IMPLANT
PACK CARDIAC CATHETERIZATION (CUSTOM PROCEDURE TRAY) ×2 IMPLANT
SET ATX-X65L (MISCELLANEOUS) IMPLANT

## 2024-03-30 NOTE — Discharge Instructions (Signed)
 Radial Site Care The following information offers guidance on how to care for yourself after your procedure. Your health care provider may also give you more specific instructions. If you have problems or questions, contact your health care provider. What can I expect after the procedure? After the procedure, it is common to have bruising and tenderness in the incision area. Follow these instructions at home: Incision site care  Follow instructions from your health care provider about how to take care of your incision site. Make sure you: Wash your hands with soap and water for at least 20 seconds before and after you change your bandage (dressing). If soap and water are not available, use hand sanitizer. Remove your dressing in 24 hours. Leave stitches (sutures), skin glue, or adhesive strips in place. These skin closures may need to stay in place for 2 weeks or longer. If adhesive strip edges start to loosen and curl up, you may trim the loose edges. Do not remove adhesive strips completely unless your health care provider tells you to do that. Do not take baths, swim, or use a hot tub for at least 1 week. You may shower 24 hours after the procedure or as told by your health care provider. Remove the dressing and gently wash the incision area with plain soap and water. Pat the area dry with a clean towel. Do not rub the site. That could cause bleeding. Do not apply powder or lotion to the site. Check your incision site every day for signs of infection. Check for: Redness, swelling, or pain. Fluid or blood. Warmth. Pus or a bad smell. Activity For 24 hours after the procedure, or as directed by your health care provider: Do not flex or bend the affected arm. Do not push or pull heavy objects with the affected arm. Do not operate machinery or power tools. Do not drive. You should not drive yourself home from the hospital or clinic if you go home during that time period. You may drive 24  hours after the procedure unless your health care provider tells you not to. Do not lift anything that is heavier than 10 lb (4.5 kg), or the limit that you are told, until your health care provider says that it is safe. Return to your normal activities as told by your health care provider. Ask your health care provider what activities are safe for you and when you can return to work. If you were given a sedative during the procedure, it can affect you for several hours. Do not drive or operate machinery until your health care provider says that it is safe. General instructions Take over-the-counter and prescription medicines only as told by your health care provider. If you will be going home right after the procedure, plan to have a responsible adult care for you for the time you are told. This is important. Keep all follow-up visits. This is important. Contact a health care provider if: You have a fever or chills. You have any of these signs of infection at your incision site: Redness, swelling, or pain. Fluid or blood. Warmth. Pus or a bad smell. Get help right away if: The incision area swells very fast. The incision area is bleeding, and the bleeding does not stop when you hold steady pressure on the area. Your arm or hand becomes pale, cool, tingly, or numb. These symptoms may represent a serious problem that is an emergency. Do not wait to see if the symptoms will go away. Get medical  help right away. Call your local emergency services (911 in the [place] Do not drive yourself to the hospital. Summary After the procedure, it is common to have bruising and tenderness at the incision site. Follow instructions from your health care provider about how to take care of your radial site incision. Check the incision every day for signs of infection. Do not lift anything that is heavier than 10 lb (4.5 kg), or the limit that you are told, until your health care provider says that it is  safe. Get help right away if the incision area swells very fast, you have bleeding at the incision site that will not stop, or your arm or hand becomes pale, cool, or numb. This information is not intended to replace advice given to you by your health care provider. Make sure you discuss any questions you have with your health care provider. Document Revised: 12/25/2020 Document Reviewed: 12/25/2020 Elsevier Patient Education  2024 [employer].

## 2024-03-30 NOTE — Interval H&P Note (Signed)
 History and Physical Interval Note:  03/30/2024 9:47 AM  Beth Hubbard  has presented today for surgery, with the diagnosis of atypical chest pain and abnormal coronary CTA.  The various methods of treatment have been discussed with the patient and family. After consideration of risks, benefits and other options for treatment, the patient has consented to  Procedure(s): LEFT HEART CATH AND CORONARY ANGIOGRAPHY (N/A) as a surgical intervention.  The patient's history has been reviewed, patient examined, no change in status, stable for surgery.  I have reviewed the patient's chart and labs.  Questions were answered to the patient's satisfaction.    Cath Lab Visit (complete for each Cath Lab visit)  Clinical Evaluation Leading to the Procedure:   ACS: No.  Non-ACS:    Anginal Classification: CCS IV  Anti-ischemic medical therapy: Minimal Therapy (1 class of medications)  Non-Invasive Test Results: Coronary CTA with borderline significant proximal LAD stenosis.  Prior CABG: No previous CABG  Tydus Sanmiguel

## 2024-03-30 NOTE — H&P (Signed)
 Cardiology Admission History and Physical   Patient ID: HAZELEIGH TRAMMELL MRN: [id number]; DOB: 1946-11-13   Admission date: 03/30/2024  PCP:  Jerrlyn Morel, NP   [facility] Providers Cardiologist:  Gaylyn Keas, MD        Chief Complaint: Chest pain and shortness of breath  Patient Profile:   Beth Hubbard is a 78 y.o. female with history of coronary artery disease by coronary CTA, hypertension, hyperlipidemia, type 2 diabetes mellitus, obstructive sleep apnea, and TIA, who is being seen 03/30/2024 for the evaluation of chest pain, shortness of breath, and abnormal coronary CTA.  History of Present Illness:   Ms. Stropes reports that she developed sporadic chest pain worsened by deep inspiration last year.  It had resolved spontaneously but returned about 2 months ago.  She notices it most when she exerts herself but also sometimes at rest if she is talking or extended periods of time.  She denies associated palpitations, lightheadedness, and edema.  Recent coronary CTA showed moderate disease in the proximal LAD of 50 to 69% which was borderline significant by CT FFR.  Of note, the patient has a history of aspirin intolerance due to ringing in the ears and tremor.  She was recently started on clopidogrel  pending catheterization.   Past Medical History:  Diagnosis Date   Arthritis    Diabetes mellitus without complication (HCC)    Excessive daytime sleepiness    Glaucoma    High cholesterol    Hyperlipemia    Hypertension    Mitral regurgitation    mild by echo 03/2017   OSA (obstructive sleep apnea) 10/18/2015   Mild OSA with AHI 12.5/hr on CPAP at 8cm H2O   Pneumonia    Shingles    TIA (transient ischemic attack)     Past Surgical History:  Procedure Laterality Date   ABDOMINAL SURGERY     BICEPT TENODESIS Left 01/11/2023   Procedure: BICEPS TENODESIS;  Surgeon: Neil Balls, MD;  Location: [facility];  Service: Orthopedics;  Laterality: Left;    CANTHOPLASTY Right 03/30/2022   Procedure: CANTHOPLASTY;  Surgeon: Debbra Fairy, MD;  Location: [facility] OR;  Service: Ophthalmology;  Laterality: Right;   EYE EXAMINATION UNDER ANESTHESIA Right 03/30/2022   Procedure: EYE EXAM UNDER ANESTHESIA;  Surgeon: Debbra Fairy, MD;  Location: [facility] OR;  Service: Ophthalmology;  Laterality: Right;   EYE SURGERY Bilateral    cataract surgery   ORIF ORBITAL FRACTURE Right 03/30/2022   Procedure: ORBITAL FLOOR FRACTURE REPAIR WITH IMPLANT;  Surgeon: Debbra Fairy, MD;  Location: [facility] OR;  Service: Ophthalmology;  Laterality: Right;   ROTATOR CUFF REPAIR     bilateral   SHOULDER ARTHROSCOPY WITH OPEN ROTATOR CUFF REPAIR AND DISTAL CLAVICLE ACROMINECTOMY Left 01/11/2023   Procedure: ARTHROSCOPY SHOULDER MINI OPEN ROTATOR CUFF REPAIR, SUBACROMIAL DECOMPRESSION AND DISTAL CLAVICLE RESECTION;  Surgeon: Neil Balls, MD;  Location: [facility];  Service: Orthopedics;  Laterality: Left;   TEE WITHOUT CARDIOVERSION N/A 07/20/2016   Procedure: TRANSESOPHAGEAL ECHOCARDIOGRAM (TEE);  Surgeon: Hazle Lites, MD;  Location: [facility];  Service: Cardiovascular;  Laterality: N/A;   TOOTH EXTRACTION Left 10/09/2019   Procedure: DENTAL EXTRACTION X1 and Irrigation and Debridement.;  Surgeon: Ascencion Lava, DDS;  Location: [facility] OR;  Service: Oral Surgery;  Laterality: Left;  DENTAL EXTRACTION X1 and Irrigation and Debridement.     Medications Prior to Admission: Prior to Admission medications   Medication Sig Start Date Johari Pinney Date Taking? Authorizing Provider  acetaminophen  (  TYLENOL ) 500 MG tablet Take 500 mg by mouth every 6 (six) hours as needed for mild pain (pain score 1-3) or moderate pain (pain score 4-6).   Yes [provider]  amLODipine  (NORVASC ) 10 MG tablet TAKE 1 TABLET(10 MG) BY MOUTH DAILY 02/19/24  Yes Nichols, Tonya S, NP  brimonidine-timolol (COMBIGAN) 0.2-0.5 % ophthalmic solution Place 1 drop into both eyes 2 (two) times daily. 07/24/22   Yes [provider]  cetirizine  (ZYRTEC ) 10 MG tablet Take 1 tablet (10 mg total) by mouth daily. 08/30/23  Yes Jerrlyn Morel, NP  clopidogrel  (PLAVIX ) 75 MG tablet Take 1 tablet (75 mg total) by mouth daily. 03/25/24  Yes Aron Inge, Veryl Gottron, MD  dorzolamide  (TRUSOPT ) 2 % ophthalmic solution Place 1 drop into both eyes 2 (two) times daily. 06/11/22  Yes [provider]  Doxylamine Succinate, Sleep, (SLEEP AID PO) Take 25 mg by mouth at bedtime.   Yes [provider]  gabapentin  (NEURONTIN ) 300 MG capsule TAKE 1 CAPSULE BY MOUTH EVERY NIGHT AT BEDTIME 12/26/23  Yes Nichols, Tonya S, NP  hydrOXYzine  (VISTARIL ) 25 MG capsule Take 1 capsule (25 mg total) by mouth at bedtime as needed. 07/12/23  Yes Jerrlyn Morel, NP  rosuvastatin  (CRESTOR ) 40 MG tablet Take 1 tablet (40 mg total) by mouth daily. Patient taking differently: Take 40 mg by mouth at bedtime. 03/23/24  Yes Turner, Rufus Council, MD  sitaGLIPtin  (JANUVIA ) 50 MG tablet TAKE 1 TABLET(50 MG) BY MOUTH DAILY 03/23/24  Yes Nichols, Tonya S, NP  sodium chloride  (MURO 128) 5 % ophthalmic solution Place 1 drop into both eyes 2 (two) times daily.   Yes [provider]  traZODone  (DESYREL ) 100 MG tablet TAKE 1 TABLET(100 MG) BY MOUTH AT BEDTIME 02/19/24  Yes Jerrlyn Morel, NP  valsartan  (DIOVAN ) 80 MG tablet Take 1 tablet (80 mg total) by mouth daily. Patient taking differently: Take 80 mg by mouth at bedtime. 12/24/23  Yes Von Grumbling, PA-C  ACCU-CHEK AVIVA PLUS test strip Reported on 02/24/2016 09/23/20   Gregoria Leas, NP  Accu-Chek Softclix Lancets lancets Check BS once or twice a day. 09/23/20   Gregoria Leas, NP  blood glucose meter kit and supplies KIT Dispense based on patient and insurance preference. Use up to four times daily as directed. 09/25/22   Jerrlyn Morel, NP  Blood Pressure Monitor KIT 1 kit by Does not apply route daily. 03/24/21   Gregoria Leas, NP     Allergies:    Allergies  Allergen Reactions    Aspirin     Tremor    Aspirin Other (See Comments)    Ringing in Ear   Ibuprofen Other (See Comments)    Ringing in Ear   Penicillins Itching and Other (See Comments)    Reaction: itching,headache, dizziness and anxiety. Feels like she is "goin off" Did it involve swelling of the face/tongue/throat, SOB, or low BP? No Did it involve sudden or severe rash/hives, skin peeling, or any reaction on the inside of your mouth or nose? No Did you need to seek medical attention at a hospital or doctor's office? No When did it last happen?      Childhood If all above answers are "NO", may proceed with cephalosporin use.   Penicillins Other (See Comments)    Patient fainted   Nsaids Anxiety    Reaction: causes dizziness and headache. Ibuprofen. Aspirin    Social History:   Social History   Socioeconomic History  Marital status: Legally Separated    Spouse name: Not on file   Number of children: Not on file   Years of education: Not on file   Highest education level: Not on file  Occupational History   Not on file  Tobacco Use   Smoking status: Never   Smokeless tobacco: Never  Vaping Use   Vaping status: Never Used  Substance and Sexual Activity   Alcohol use: Never   Drug use: Never   Sexual activity: Not Currently  Other Topics Concern   Not on file  Social History Narrative   ** Merged History Encounter **       Social Drivers of Health   Financial Resource Strain: Low Risk  (01/30/2024)   Overall Financial Resource Strain (CARDIA)    Difficulty of Paying Living Expenses: Not hard at all  Food Insecurity: No Food Insecurity (01/30/2024)   Hunger Vital Sign    Worried About Running Out of Food in the Last Year: Never true    Ran Out of Food in the Last Year: Never true  Transportation Needs: No Transportation Needs (01/30/2024)   PRAPARE - Administrator, Civil Service (Medical): No    Lack of Transportation (Non-Medical): No  Physical Activity: Sufficiently  Active (01/30/2024)   Exercise Vital Sign    Days of Exercise per Week: 7 days    Minutes of Exercise per Session: 30 min  Recent Concern: Physical Activity - Insufficiently Active (12/13/2023)   Exercise Vital Sign    Days of Exercise per Week: 2 days    Minutes of Exercise per Session: 20 min  Stress: No Stress Concern Present (01/30/2024)   [employer] of Occupational Health - Occupational Stress Questionnaire    Feeling of Stress : Not at all  Social Connections: Moderately Integrated (01/30/2024)   Social Connection and Isolation Panel [NHANES]    Frequency of Communication with Friends and Family: More than three times a week    Frequency of Social Gatherings with Friends and Family: More than three times a week    Attends Religious Services: More than 4 times per year    Active Member of [employer] or Organizations: Yes    Attends Banker Meetings: More than 4 times per year    Marital Status: Separated  Intimate Partner Violence: Not At Risk (01/30/2024)   Humiliation, Afraid, Rape, and Kick questionnaire    Fear of Current or Ex-Partner: No    Emotionally Abused: No    Physically Abused: No    Sexually Abused: No    Family History:   The patient's family history includes Cancer in her brother, brother, father, sister, and sister. There is no history of Breast cancer.    ROS:  Please see the history of present illness. All other ROS reviewed and negative.     Physical Exam/Data:   Vitals:   03/30/24 0718 03/30/24 0938  BP: (!) 152/65   Pulse: 77   Resp: 18   Temp: 97.9 F (36.6 C)   TempSrc: Oral   SpO2: 99% 99%  Weight: 68 kg   Height: 5\' 2"  (1.575 m)    No intake or output data in the 24 hours ending 03/30/24 0943    03/30/2024    7:18 AM 03/13/2024   10:16 AM 02/27/2024    8:58 AM  Last 3 Weights  Weight (lbs) 150 lb 151 lb 9.6 oz 152 lb 12.8 oz  Weight (kg) 68.04 kg 68.765 kg 69.31 kg  Body mass index is 27.44 kg/m.  General:  Well  nourished, well developed, in no acute distress HEENT: normal Neck: no JVD Vascular: No carotid bruits; Distal pulses 2+ bilaterally   Cardiac:  normal S1, S2; RRR; no murmurs, rubs, or gallops Lungs:  clear to auscultation bilaterally, no wheezing, rhonchi or rales  Abd: soft, nontender, no hepatomegaly  Ext: no lower extremity edema Musculoskeletal:  No deformities, BUE and BLE strength normal and equal Skin: warm and dry  Neuro:  CNs 2-12 intact, no focal abnormalities noted Psych:  Normal affect    EKG:  The ECG that was done today at 0716 was personally reviewed and demonstrates normal sinus rhythm without abnormalities.  Relevant CV Studies: See coronary CTA above  Laboratory Data:  High Sensitivity Troponin:  No results for input(s): "TROPONINIHS" in the last 720 hours.    ChemistryNo results for input(s): "NA", "K", "CL", "CO2", "GLUCOSE", "BUN", "CREATININE", "CALCIUM ", "MG", "GFRNONAA", "GFRAA", "ANIONGAP" in the last 168 hours.  No results for input(s): "PROT", "ALBUMIN", "AST", "ALT", "ALKPHOS", "BILITOT" in the last 168 hours. Lipids No results for input(s): "CHOL", "TRIG", "HDL", "LABVLDL", "LDLCALC", "CHOLHDL" in the last 168 hours. HematologyNo results for input(s): "WBC", "RBC", "HGB", "HCT", "MCV", "MCH", "MCHC", "RDW", "PLT" in the last 168 hours. Thyroid No results for input(s): "TSH", "FREET4" in the last 168 hours. BNPNo results for input(s): "BNP", "PROBNP" in the last 168 hours.  DDimer No results for input(s): "DDIMER" in the last 168 hours.   Radiology/Studies:  No results found.   Assessment and Plan:   Atypical chest pain and abnormal coronary CTA: Ms. Arline Laity has episodic chest pain and shortness of breath which sometimes is related to exertion.  Recent coronary CTA suggest borderline significant proximal LAD stenosis.  We have agreed to move forward with cardiac catheterization and possible PCI.  Given that she has an aspirin intolerance, she was  already started on clopidogrel .  If significant proximal LAD disease is identified and needs to be managed with percutaneous coronary intervention, will need to readdress long-term antiplatelet strategy.  Informed Consent   Shared Decision Making/Informed Consent The risks [stroke (1 in 1000), death (1 in 1000), kidney failure [usually temporary] (1 in 500), bleeding (1 in 200), allergic reaction [possibly serious] (1 in 200)], benefits (diagnostic support and management of coronary artery disease) and alternatives of a cardiac catheterization were discussed in detail with Ms. Costilow and she is willing to proceed.     For questions or updates, please contact [facility] Please consult [email or web address] for contact info under     Signed, Sammy Crisp, MD  03/30/2024 9:43 AM

## 2024-03-31 ENCOUNTER — Telehealth: Payer: Self-pay | Admitting: Cardiology

## 2024-03-31 NOTE — Telephone Encounter (Signed)
 Pt's daughter Eveleen Hinds is requesting a callback wanting to know if pt still needs to come in to have an ECHO done on 5/20 since she had a heart cath just done yesterday. She just wanted to make sure that's ok. Please advise

## 2024-04-02 ENCOUNTER — Other Ambulatory Visit: Payer: Self-pay

## 2024-04-02 ENCOUNTER — Other Ambulatory Visit: Payer: Self-pay | Admitting: Nurse Practitioner

## 2024-04-02 DIAGNOSIS — M542 Cervicalgia: Secondary | ICD-10-CM

## 2024-04-02 NOTE — Telephone Encounter (Signed)
 Call to dtr Eveleen Hinds [facility]) to advise patient still needs echo, edna verbalizes understanding.

## 2024-04-07 ENCOUNTER — Ambulatory Visit (HOSPITAL_COMMUNITY)
Admission: RE | Admit: 2024-04-07 | Discharge: 2024-04-07 | Disposition: A | Discharge status: Home / Self Care | Source: Ambulatory Visit | Attending: Cardiology | Admitting: Cardiology

## 2024-04-07 ENCOUNTER — Ambulatory Visit: Payer: Self-pay | Admitting: Internal Medicine

## 2024-04-07 DIAGNOSIS — R072 Precordial pain: Secondary | ICD-10-CM | POA: Diagnosis not present

## 2024-04-07 DIAGNOSIS — G4733 Obstructive sleep apnea (adult) (pediatric): Secondary | ICD-10-CM | POA: Diagnosis present

## 2024-04-07 DIAGNOSIS — I340 Nonrheumatic mitral (valve) insufficiency: Secondary | ICD-10-CM | POA: Diagnosis not present

## 2024-04-07 DIAGNOSIS — I10 Essential (primary) hypertension: Secondary | ICD-10-CM | POA: Diagnosis not present

## 2024-04-07 LAB — ECHOCARDIOGRAM COMPLETE
Area-P 1/2: 2.73 cm2
MV M vel: 5.55 m/s
MV Peak grad: 123.2 mmHg
Radius: 0.7 cm
S' Lateral: 2.5 cm

## 2024-04-07 LAB — CYTOCHROME P450 2C19

## 2024-04-10 ENCOUNTER — Ambulatory Visit: Payer: Self-pay | Admitting: Cardiology

## 2024-04-10 ENCOUNTER — Other Ambulatory Visit: Payer: Self-pay

## 2024-04-10 ENCOUNTER — Encounter: Payer: Self-pay | Admitting: Cardiology

## 2024-04-10 DIAGNOSIS — M542 Cervicalgia: Secondary | ICD-10-CM

## 2024-04-10 DIAGNOSIS — I340 Nonrheumatic mitral (valve) insufficiency: Secondary | ICD-10-CM

## 2024-04-10 NOTE — Telephone Encounter (Signed)
 Please advise [facility]

## 2024-04-14 NOTE — Telephone Encounter (Signed)
 [facility] message regarding Echo results, Dr. Micael Adas requests repeat echo in one year for mitral regurgitation.

## 2024-04-14 NOTE — Telephone Encounter (Signed)
-----   Message from Gaylyn Keas sent at 04/10/2024  1:24 PM EDT ----- Echo showed normal pumping function of the heart EF 60 to 65% with increase stiffness of the heart called diastolic dysfunction.  RV function is mildly reduced with moderately enlarged left atrium.  There is moderate leakiness of the mitral valve.  Compared to prior echo in 2018 mild MR is increased to moderate.  Please repeat echo in 1 year for follow-up of mitral regurgitation.

## 2024-04-15 ENCOUNTER — Telehealth: Payer: Self-pay | Admitting: Nurse Practitioner

## 2024-04-15 ENCOUNTER — Other Ambulatory Visit: Payer: Self-pay | Admitting: Nurse Practitioner

## 2024-04-15 ENCOUNTER — Telehealth: Payer: Self-pay | Admitting: Cardiology

## 2024-04-15 ENCOUNTER — Ambulatory Visit: Payer: Self-pay

## 2024-04-15 DIAGNOSIS — M542 Cervicalgia: Secondary | ICD-10-CM

## 2024-04-15 MED ORDER — GABAPENTIN 300 MG PO CAPS: 300.0000 mg | ORAL_CAPSULE | Freq: Every day | ORAL | 0 refills | Status: DC

## 2024-04-15 NOTE — Telephone Encounter (Signed)
 Opened triage encounter for pt. See other nurse note from 5/28.

## 2024-04-15 NOTE — Telephone Encounter (Signed)
 Spoke with pt's daughter and pt continues to have chest pain per pt "sharp pain under left breast " with a period of SOB no other symptoms Per pt can happen at rest Reviewed last cath and pt does have 75% blockage to the proximal LAD may treat medically if pain persist may need to PCI per cath report . Per daughter will continue to monitor. Instructed if S/S worsen to go to ED for eval and tx Pt has f/u with Marcie Sever mid June s/p cath  Will forward to Dr Micael Adas for review and recommendations ./cy

## 2024-04-15 NOTE — Telephone Encounter (Signed)
  Pt c/o of Chest Pain: STAT if active CP, including tightness, pressure, jaw pain, radiating pain to shoulder/upper arm/back, CP unrelieved by Nitro. Symptoms reported of SOB, nausea, vomiting, sweating.  1. Are you having CP right now?  No  2. Are you experiencing any other symptoms (ex. SOB, nausea, vomiting, sweating)?   Not today  3. Is your CP continuous or coming and going?   Comes and goes  4. Have you taken Nitroglycerin ?   No  5. How long have you been experiencing CP?   Since last night   6. If NO CP at time of call then end call with telling Pt to call back or call 911 if Chest pain returns prior to return call from triage team.   Daughter [facility]) stated patient had chest pain under her breast last night but not today.  Daughter wants a call back to discuss next steps.

## 2024-04-15 NOTE — Telephone Encounter (Signed)
 Copied from CRM [id number]. Topic: Clinical - Medication Refill >> Apr 15, 2024 12:06 PM Sasha H wrote: Medication:  gabapentin  (NEURONTIN ) 300 MG capsule  Has the patient contacted their pharmacy? Yes (Agent: If no, request that the patient contact the pharmacy for the refill. If patient does not wish to contact the pharmacy document the reason why and proceed with request.) (Agent: If yes, when and what did the pharmacy advise?)  This is the patient's preferred pharmacy:  [facility] [phone] - [place], [place] - [address] AT [facility] [address] [place] [address] Phone: [phone] Fax: [phone]  Is this the correct pharmacy for this prescription? Yes If no, delete pharmacy and type the correct one.   Has the prescription been filled recently? Yes  Is the patient out of the medication? Yes  Has the patient been seen for an appointment in the last year OR does the patient have an upcoming appointment? Yes  Can we respond through MyChart? Yes  Agent: Please be advised that Rx refills may take up to 3 business days. We ask that you follow-up with your pharmacy.

## 2024-04-15 NOTE — Telephone Encounter (Signed)
 Copied from CRM [id number]. Topic: Clinical - Medication Refill >> Apr 15, 2024 12:06 PM Sasha H wrote: Medication:  gabapentin  (NEURONTIN ) 300 MG capsule   Has the patient contacted their pharmacy? Yes (Agent: If no, request that the patient contact the pharmacy for the refill. If patient does not wish to contact the pharmacy document the reason why and proceed with request.) (Agent: If yes, when and what did the pharmacy advise?)   This is the patient's preferred pharmacy:  [facility] [id number] - [place], [place] - [address] AT [facility] [address] [place] [address] Phone: [phone] Fax: [phone]   Is this the correct pharmacy for this prescription? Yes If no, delete pharmacy and type the correct one.    Has the prescription been filled recently? Yes   Is the patient out of the medication? Yes   Has the patient been seen for an appointment in the last year OR does the patient have an upcoming appointment? Yes   Can we respond through MyChart? Yes   Agent: Please be advised that Rx refills may take up to 3 business days. We ask that you follow-up with your pharmacy.    Chief Complaint: chest pain Disposition: [] 911 / [] ED /[] Urgent Care (no appt availability in office) / [] Appointment(In office/virtual)/ []  [place] [facility]/ [] Home Care/ [x] Refused Recommended Disposition /[] [place] [facility]/ []  Follow-up with PCP Additional Notes: Called pt home number to see if pt needs triage for requesting refill of gabapentin . Pt daughter answered, pt in background. Pt daughter and pt are poor historians, limited and unclear info. Nurse asked if pt experiencing any symptoms or severe pain, pt daughter reporting "she's okay, some days are better now." Pt daughter and pt state that the medication regimen is "confusing" and they are not sure what pt should still be taking since pt had recent cardiac procedure. Nurse checked  through pt chart, no clear answers with meds, advised sending message to PCP office for call back for more clarification and setting up hospital follow-up since limited info given for triage today. Nurse attempting further triage, pt daughter reporting that pt is "not currently" having symptoms, asked if pt doing okay without meds for right now since out of med, pt daughter responds that "she still needs her meds" but does not confirm or deny amount of pain pt is in. Pt daughter reporting that pt's "BP stays up higher" but unsure of BP readings, confirms sometimes systolic above 160 but no context provided in terms of symptoms or frequency when asked by nurse. Pt reporting she sometimes gets "sharp pain in chest" that lasts 2-3 min long intermittently, pt daughter and pt confirm pt has some SOB with "gasping" breaths during these times of chest pain. Pt daughter confirms this has occurred since pt's procedure. Advised pt be examined in hospital asap for symptoms, pt daughter refusing ED. Advised strongly pt be examined in ED for potential of complications, needs sooner follow-up than a month from now, pt daughter refusing, denies that ED will do anything despite nurse education, but plans to call Dr. Micael Adas cardiologist. Advised sending HP message to PCP for call back with further recommendations. Attempted to call [facility] to alert of ED refusal, no answer from [facility]. Pt daughter still requesting gabapentin  refill for pt, requesting hospital follow-up appt. Please advise.  Reason for Disposition  Difficulty breathing  Answer Assessment - Initial Assessment Questions 4. PATTERN: "Does the pain come and  go, or has it been constant since it started?"  "Does it get worse with exertion?"      On and off 5. DURATION: "How long does it last" (e.g., seconds, minutes, hours)     2-3 min 6. SEVERITY: "How bad is the pain?"  (e.g., Scale 1-10; mild, moderate, or severe)    - MILD (1-3): doesn't interfere with normal  activities     - MODERATE (4-7): interferes with normal activities or awakens from sleep    - SEVERE (8-10): excruciating pain, unable to do any normal activities       Sharp pain 7. CARDIAC RISK FACTORS: "Do you have any history of heart problems or risk factors for heart disease?" (e.g., angina, prior heart attack; diabetes, high blood pressure, high cholesterol, smoker, or strong family history of heart disease)     Significant, recent cardiac procedure 8. PULMONARY RISK FACTORS: "Do you have any history of lung disease?"  (e.g., blood clots in lung, asthma, emphysema, birth control pills)     OSA 9. CAUSE: "What do you think is causing the chest pain?"     Chronic this pain that's gone on 10. OTHER SYMPTOMS: "Do you have any other symptoms?" (e.g., dizziness, nausea, vomiting, sweating, fever, difficulty breathing, cough)       Has gasping breaths with the sharp chest pain even after procedure  She's okay, some days are better now,  Question, told me amlodipine  is out,   Requesting call back for clarification about med regimen. BP stays up higher Sometimes sharp pain in chest and on/off 2-3 min Some SOB with gasping breaths even after procedure  Refusing ED Refusing education Don't know  Protocols used: Chest Pain-A-AH

## 2024-04-16 MED ORDER — ISOSORBIDE MONONITRATE ER 30 MG PO TB24: 30.0000 mg | ORAL_TABLET | Freq: Every day | ORAL | 3 refills | Status: AC

## 2024-04-16 NOTE — Telephone Encounter (Addendum)
 Spoke with daughter and she states her mom is still having symptoms. She would like to know if the stent can be scheduled. She has been having episodes of chest pain. Asymptomatic this morning. ED precautions discussed

## 2024-04-16 NOTE — Telephone Encounter (Signed)
-----   Message from Gaylyn Keas sent at 04/10/2024  1:24 PM EDT ----- Echo showed normal pumping function of the heart EF 60 to 65% with increase stiffness of the heart called diastolic dysfunction.  RV function is mildly reduced with moderately enlarged left atrium.  There is moderate leakiness of the mitral valve.  Compared to prior echo in 2018 mild MR is increased to moderate.  Please repeat echo in 1 year for follow-up of mitral regurgitation.

## 2024-04-16 NOTE — Telephone Encounter (Signed)
 Daughter following up. Yesterday evening patient had another episode of chest pain in the center of her chest. She checked her BP and it was in the 170/80's. She took night BP medication and BP came down to 134/70's. Daughter would like to know if patient needs to have stent placed + discuss next steps.

## 2024-04-16 NOTE — Telephone Encounter (Signed)
 Call to dtr to advise that Dr. Micael Adas advises to keep appt with PA in 2 weeks and start imdur 30 mg daily. Provided education about mechanism of action and side effects, dtr (DPR) verbalizes understanding, order placed.

## 2024-04-16 NOTE — Telephone Encounter (Signed)
 [facility] message about echo results seen by patient 04/15/24.

## 2024-05-02 ENCOUNTER — Other Ambulatory Visit: Payer: Self-pay | Admitting: Nurse Practitioner

## 2024-05-02 DIAGNOSIS — E1129 Type 2 diabetes mellitus with other diabetic kidney complication: Secondary | ICD-10-CM

## 2024-05-03 NOTE — Progress Notes (Signed)
 Cardiology Office Note:    Date:  05/05/2024   ID:  Selby, Slovacek 10/29/1946, MRN [id number]  PCP:  Oley Bascom RAMAN, NP   [facility] Providers Cardiologist:  Wilbert Bihari, MD Cardiology APP:  Madie Beth Garre, PA     Referring MD: Oley Bascom RAMAN, NP   Chief Complaint  Patient presents with   Follow-up    CAD    History of Present Illness:    Beth Hubbard is a 78 y.o. female with a hx of CAD, hypertension, hyperlipidemia, DM2, OSA, and TIA.  Coronary CTA showed moderate disease in the proximal LAD borderline significant by CT FFR.  She presented to the hospital 03/30/2024 with chest pain.  LHC 03/30/2024 showed severe single-vessel disease with 75% stenosis in proximal LAD, no PCI.  Per chart review, she continues to have chest pain.  She presents for follow-up.  She no longer has chest pain with the addition of imdur , but continues to have hypertension. Long discussion about diet, African diet is typically high in sodium and fat.    Past Medical History:  Diagnosis Date   Arthritis    Diabetes mellitus without complication (HCC)    Excessive daytime sleepiness    Glaucoma    High cholesterol    Hyperlipemia    Hypertension    Mitral regurgitation    Moderate by echo 03/2024   OSA (obstructive sleep apnea) 10/18/2015   Mild OSA with AHI 12.5/hr on CPAP at 8cm H2O   Pneumonia    Shingles    TIA (transient ischemic attack)     Past Surgical History:  Procedure Laterality Date   ABDOMINAL SURGERY     BICEPT TENODESIS Left 01/11/2023   Procedure: BICEPS TENODESIS;  Surgeon: Yvone Rush, MD;  Location: [facility];  Service: Orthopedics;  Laterality: Left;   CANTHOPLASTY Right 03/30/2022   Procedure: CANTHOPLASTY;  Surgeon: Rodgers Faden, MD;  Location: [facility] OR;  Service: Ophthalmology;  Laterality: Right;   EYE EXAMINATION UNDER ANESTHESIA Right 03/30/2022   Procedure: EYE EXAM UNDER ANESTHESIA;  Surgeon: Rodgers Faden, MD;   Location: [facility] OR;  Service: Ophthalmology;  Laterality: Right;   EYE SURGERY Bilateral    cataract surgery   LEFT HEART CATH AND CORONARY ANGIOGRAPHY N/A 03/30/2024   Procedure: LEFT HEART CATH AND CORONARY ANGIOGRAPHY;  Surgeon: Mady Bruckner, MD;  Location: [facility]AS[facility];  Service: Cardiovascular;  Laterality: N/A;   ORIF ORBITAL FRACTURE Right 03/30/2022   Procedure: ORBITAL FLOOR FRACTURE REPAIR WITH IMPLANT;  Surgeon: Rodgers Faden, MD;  Location: [facility] OR;  Service: Ophthalmology;  Laterality: Right;   ROTATOR CUFF REPAIR     bilateral   SHOULDER ARTHROSCOPY WITH OPEN ROTATOR CUFF REPAIR AND DISTAL CLAVICLE ACROMINECTOMY Left 01/11/2023   Procedure: ARTHROSCOPY SHOULDER MINI OPEN ROTATOR CUFF REPAIR, SUBACROMIAL DECOMPRESSION AND DISTAL CLAVICLE RESECTION;  Surgeon: Yvone Rush, MD;  Location:  SURGERY CENTER;  Service: Orthopedics;  Laterality: Left;   TEE WITHOUT CARDIOVERSION N/A 07/20/2016   Procedure: TRANSESOPHAGEAL ECHOCARDIOGRAM (TEE);  Surgeon: Vinie JAYSON Maxcy, MD;  Location: [facility] ENDOSCOPY;  Service: Cardiovascular;  Laterality: N/A;   TOOTH EXTRACTION Left 10/09/2019   Procedure: DENTAL EXTRACTION X1 and Irrigation and Debridement.;  Surgeon: Sheryle Hamilton, DDS;  Location: [facility] OR;  Service: Oral Surgery;  Laterality: Left;  DENTAL EXTRACTION X1 and Irrigation and Debridement.    Current Medications: Current Meds  Medication Sig   ACCU-CHEK AVIVA PLUS test strip Reported on 02/24/2016   Accu-Chek Softclix  Lancets lancets Check BS once or twice a day.   acetaminophen  (TYLENOL ) 500 MG tablet Take 500 mg by mouth every 6 (six) hours as needed for mild pain (pain score 1-3) or moderate pain (pain score 4-6).   amLODipine  (NORVASC ) 10 MG tablet TAKE 1 TABLET(10 MG) BY MOUTH DAILY   aspirin  EC 81 MG tablet Take 1 tablet (81 mg total) by mouth daily. Swallow whole.   blood glucose meter kit and supplies KIT Dispense based on patient and insurance preference. Use up to four  times daily as directed.   Blood Pressure Monitor KIT 1 kit by Does not apply route daily.   brimonidine-timolol (COMBIGAN) 0.2-0.5 % ophthalmic solution Place 1 drop into both eyes 2 (two) times daily.   cetirizine  (ZYRTEC ) 10 MG tablet Take 1 tablet (10 mg total) by mouth daily.   clopidogrel  (PLAVIX ) 75 MG tablet Take 1 tablet (75 mg total) by mouth daily.   dorzolamide  (TRUSOPT ) 2 % ophthalmic solution Place 1 drop into both eyes 2 (two) times daily.   Doxylamine Succinate, Sleep, (SLEEP AID PO) Take 25 mg by mouth at bedtime.   gabapentin  (NEURONTIN ) 300 MG capsule Take 1 capsule (300 mg total) by mouth at bedtime.   hydrOXYzine  (VISTARIL ) 25 MG capsule Take 1 capsule (25 mg total) by mouth at bedtime as needed.   isosorbide  mononitrate (IMDUR ) 30 MG 24 hr tablet Take 1 tablet (30 mg total) by mouth daily.   metoprolol  succinate (TOPROL  XL) 25 MG 24 hr tablet Take 1 tablet (25 mg total) by mouth daily.   rosuvastatin  (CRESTOR ) 40 MG tablet Take 1 tablet (40 mg total) by mouth daily. (Patient taking differently: Take 40 mg by mouth at bedtime.)   sitaGLIPtin  (JANUVIA ) 50 MG tablet TAKE 1 TABLET(50 MG) BY MOUTH DAILY   sodium chloride  (MURO 128) 5 % ophthalmic solution Place 1 drop into both eyes 2 (two) times daily.   traZODone  (DESYREL ) 100 MG tablet TAKE 1 TABLET(100 MG) BY MOUTH AT BEDTIME   [DISCONTINUED] valsartan  (DIOVAN ) 80 MG tablet Take 1 tablet (80 mg total) by mouth daily. (Patient taking differently: Take 80 mg by mouth at bedtime.)     Allergies:   Aspirin , Aspirin , Ibuprofen, Penicillins, Penicillins, and Nsaids   Social History   Socioeconomic History   Marital status: Legally Separated    Spouse name: Not on file   Number of children: Not on file   Years of education: Not on file   Highest education level: Not on file  Occupational History   Not on file  Tobacco Use   Smoking status: Never   Smokeless tobacco: Never  Vaping Use   Vaping status: Never Used   Substance and Sexual Activity   Alcohol use: Never   Drug use: Never   Sexual activity: Not Currently  Other Topics Concern   Not on file  Social History Narrative   ** Merged History Encounter **       Social Drivers of Health   Financial Resource Strain: Low Risk  (01/30/2024)   Overall Financial Resource Strain (CARDIA)    Difficulty of Paying Living Expenses: Not hard at all  Food Insecurity: No Food Insecurity (01/30/2024)   Hunger Vital Sign    Worried About Running Out of Food in the Last Year: Never true    Ran Out of Food in the Last Year: Never true  Transportation Needs: No Transportation Needs (01/30/2024)   PRAPARE - Administrator, Civil Service (Medical): No  Lack of Transportation (Non-Medical): No  Physical Activity: Sufficiently Active (01/30/2024)   Exercise Vital Sign    Days of Exercise per Week: 7 days    Minutes of Exercise per Session: 30 min  Recent Concern: Physical Activity - Insufficiently Active (12/13/2023)   Exercise Vital Sign    Days of Exercise per Week: 2 days    Minutes of Exercise per Session: 20 min  Stress: No Stress Concern Present (01/30/2024)   [employer] of Occupational Health - Occupational Stress Questionnaire    Feeling of Stress : Not at all  Social Connections: Moderately Integrated (01/30/2024)   Social Connection and Isolation Panel    Frequency of Communication with Friends and Family: More than three times a week    Frequency of Social Gatherings with Friends and Family: More than three times a week    Attends Religious Services: More than 4 times per year    Active Member of [employer] or Organizations: Yes    Attends Engineer, structural: More than 4 times per year    Marital Status: Separated     Family History: The patient's family history includes Cancer in her brother, brother, father, sister, and sister. There is no history of Breast cancer.  ROS:   Please see the history of present illness.      All other systems reviewed and are negative.  EKGs/Labs/Other Studies Reviewed:    The following studies were reviewed today:  EKG Interpretation Date/Time:  Tuesday May 05 2024 07:52:46 EDT Ventricular Rate:  62 PR Interval:  170 QRS Duration:  86 QT Interval:  410 QTC Calculation: 416 R Axis:   24  Text Interpretation: Normal sinus rhythm Normal ECG When compared with ECG of 30-Mar-2024 07:16, No significant change was found Confirmed by Madie Slough ([id number]) on 05/05/2024 7:58:12 AM    Recent Labs: 12/13/2023: ALT 9 03/13/2024: BUN 14; Creatinine, Ser 0.96; Hemoglobin 11.3; Platelets 210; Potassium 4.5; Sodium 145  Recent Lipid Panel    Component Value Date/Time   CHOL 174 12/13/2023 1043   TRIG 128 12/13/2023 1043   HDL 53 12/13/2023 1043   CHOLHDL 3.3 12/13/2023 1043   CHOLHDL 2.7 04/08/2017 0909   VLDL 27 04/08/2017 0909   LDLCALC 98 12/13/2023 1043     Risk Assessment/Calculations:       Physical Exam:    VS:  BP (!) 142/64   Pulse 62   Ht 5' 1 (1.549 m)   Wt 150 lb (68 kg)   SpO2 98%   BMI 28.34 kg/m     Wt Readings from Last 3 Encounters:  05/05/24 150 lb (68 kg)  03/30/24 150 lb (68 kg)  03/13/24 151 lb 9.6 oz (68.8 kg)     GEN:  Well nourished, well developed in no acute distress HEENT: Normal NECK: No JVD; No carotid bruits LYMPHATICS: No lymphadenopathy CARDIAC: RRR, no murmurs, rubs, gallops RESPIRATORY:  Clear to auscultation without rales, wheezing or rhonchi  ABDOMEN: Soft, non-tender, non-distended MUSCULOSKELETAL:  No edema; No deformity  SKIN: Warm and dry NEUROLOGIC:  Alert and oriented x 3 PSYCHIATRIC:  Normal affect   ASSESSMENT:    1. Essential hypertension   2. Hypertension, unspecified type   3. Hyperlipidemia with target LDL less than 70   4. 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 Mady)   Hypertension - 10 mg amlodipine , 30 mg imdur , 25 mg toprol , 80 mg valsartan  - she remains hypertensive at home and in office - will increase valsartan  to 160 mg - BMP in 7-10 days   Hyperlipidemia with LDL goal < 70 12/13/2023: Cholesterol, Total 174; HDL 53; LDL Chol Calc (NIH) 98; Triglycerides 128 - 40 mg crestor    Follow up in 3 months.     Medication Adjustments/Labs and Tests Ordered: Current medicines are reviewed at length with the patient today.  Concerns regarding medicines are outlined above.  Orders Placed This Encounter  Procedures   Basic metabolic panel with GFR   EKG [id number]   Meds ordered this encounter  Medications   valsartan  (DIOVAN ) 160 MG tablet    Sig: Take 1 tablet (160 mg total) by mouth at bedtime.    Dispense:  90 tablet    Refill:  3    Patient Instructions  Medication Instructions:  Increase Valsartan  160 mg daily as directed Continue to take Asprin until Labor Day and stop   *If you need a refill on your cardiac medications before your next appointment, please call your pharmacy*  Lab Work: BMET 7-10 days   Testing/Procedures: NONE ordered at this time of appointment   Follow-Up: At [facility], you and your health needs are our priority.  As part of our continuing mission to provide you with exceptional heart care, our providers are all part of one team.  This team includes your primary Cardiologist (physician) and Advanced Practice  Providers or APPs (Physician Assistants and Nurse Practitioners) who all work together to provide you with the care you need, when you need it.  Your next appointment:   Keep appointment Dr. Shlomo  Provider:   Wilbert Shlomo, MD    We recommend signing up for the patient portal called MyChart.  Sign up information is provided on this After Visit Summary.  MyChart is used to connect with patients for Virtual Visits (Telemedicine).  Patients are able to view lab/test results, encounter notes, upcoming appointments, etc.  Non-urgent messages can be sent to your provider as well.   To learn more about what you can do with MyChart, go to [email or web address].   Other Instructions        Signed, Beth Hubbard, Beth Hubbard  05/05/2024 8:55 AM    [facility]

## 2024-05-04 NOTE — Telephone Encounter (Signed)
 Please advise [facility]

## 2024-05-05 ENCOUNTER — Ambulatory Visit: Attending: Physician Assistant | Admitting: Physician Assistant

## 2024-05-05 ENCOUNTER — Encounter: Payer: Self-pay | Admitting: Physician Assistant

## 2024-05-05 VITALS — BP 142/64 | HR 62 | Ht 61.0 in | Wt 150.0 lb

## 2024-05-05 DIAGNOSIS — E785 Hyperlipidemia, unspecified: Secondary | ICD-10-CM | POA: Diagnosis not present

## 2024-05-05 DIAGNOSIS — I2510 Atherosclerotic heart disease of native coronary artery without angina pectoris: Secondary | ICD-10-CM

## 2024-05-05 DIAGNOSIS — I10 Essential (primary) hypertension: Secondary | ICD-10-CM

## 2024-05-05 MED ORDER — VALSARTAN 160 MG PO TABS: 160.0000 mg | ORAL_TABLET | Freq: Every day | ORAL | 3 refills | Status: DC

## 2024-05-05 NOTE — Patient Instructions (Signed)
 Medication Instructions:  Increase Valsartan  160 mg daily as directed Continue to take Asprin until Labor Day and stop   *If you need a refill on your cardiac medications before your next appointment, please call your pharmacy*  Lab Work: BMET 7-10 days   Testing/Procedures: NONE ordered at this time of appointment   Follow-Up: At [facility], you and your health needs are our priority.  As part of our continuing mission to provide you with exceptional heart care, our providers are all part of one team.  This team includes your primary Cardiologist (physician) and Advanced Practice Providers or APPs (Physician Assistants and Nurse Practitioners) who all work together to provide you with the care you need, when you need it.  Your next appointment:   Keep appointment Dr. Micael Adas  Provider:   Gaylyn Keas, MD    We recommend signing up for the patient portal called MyChart.  Sign up information is provided on this After Visit Summary.  MyChart is used to connect with patients for Virtual Visits (Telemedicine).  Patients are able to view lab/test results, encounter notes, upcoming appointments, etc.  Non-urgent messages can be sent to your provider as well.   To learn more about what you can do with MyChart, go to [email or web address].   Other Instructions

## 2024-05-14 ENCOUNTER — Telehealth: Payer: Self-pay | Admitting: Cardiology

## 2024-05-14 ENCOUNTER — Emergency Department (HOSPITAL_COMMUNITY)

## 2024-05-14 ENCOUNTER — Other Ambulatory Visit: Payer: Self-pay

## 2024-05-14 ENCOUNTER — Observation Stay (HOSPITAL_COMMUNITY)
Admission: EM | Admit: 2024-05-14 | Discharge: 2024-05-14 | Disposition: A | Discharge status: Home / Self Care | Attending: Internal Medicine | Admitting: Internal Medicine

## 2024-05-14 DIAGNOSIS — I2510 Atherosclerotic heart disease of native coronary artery without angina pectoris: Secondary | ICD-10-CM | POA: Insufficient documentation

## 2024-05-14 DIAGNOSIS — I110 Hypertensive heart disease with heart failure: Secondary | ICD-10-CM | POA: Insufficient documentation

## 2024-05-14 DIAGNOSIS — E785 Hyperlipidemia, unspecified: Secondary | ICD-10-CM | POA: Insufficient documentation

## 2024-05-14 DIAGNOSIS — I5033 Acute on chronic diastolic (congestive) heart failure: Secondary | ICD-10-CM | POA: Diagnosis not present

## 2024-05-14 DIAGNOSIS — M6281 Muscle weakness (generalized): Secondary | ICD-10-CM | POA: Insufficient documentation

## 2024-05-14 DIAGNOSIS — Z79899 Other long term (current) drug therapy: Secondary | ICD-10-CM | POA: Diagnosis not present

## 2024-05-14 DIAGNOSIS — R0902 Hypoxemia: Secondary | ICD-10-CM

## 2024-05-14 DIAGNOSIS — R079 Chest pain, unspecified: Secondary | ICD-10-CM

## 2024-05-14 DIAGNOSIS — I340 Nonrheumatic mitral (valve) insufficiency: Secondary | ICD-10-CM | POA: Diagnosis not present

## 2024-05-14 DIAGNOSIS — E7800 Pure hypercholesterolemia, unspecified: Secondary | ICD-10-CM | POA: Insufficient documentation

## 2024-05-14 DIAGNOSIS — D649 Anemia, unspecified: Secondary | ICD-10-CM | POA: Diagnosis not present

## 2024-05-14 DIAGNOSIS — I10 Essential (primary) hypertension: Secondary | ICD-10-CM | POA: Diagnosis not present

## 2024-05-14 DIAGNOSIS — E119 Type 2 diabetes mellitus without complications: Secondary | ICD-10-CM | POA: Insufficient documentation

## 2024-05-14 DIAGNOSIS — Z7984 Long term (current) use of oral hypoglycemic drugs: Secondary | ICD-10-CM | POA: Diagnosis not present

## 2024-05-14 DIAGNOSIS — R0602 Shortness of breath: Secondary | ICD-10-CM | POA: Diagnosis present

## 2024-05-14 DIAGNOSIS — J9601 Acute respiratory failure with hypoxia: Secondary | ICD-10-CM | POA: Insufficient documentation

## 2024-05-14 DIAGNOSIS — I2583 Coronary atherosclerosis due to lipid rich plaque: Secondary | ICD-10-CM

## 2024-05-14 LAB — COMPREHENSIVE METABOLIC PANEL WITH GFR
ALT: 35 U/L (ref 0–44)
AST: 33 U/L (ref 15–41)
Albumin: 3.6 g/dL (ref 3.5–5.0)
Alkaline Phosphatase: 45 U/L (ref 38–126)
Anion gap: 10 (ref 5–15)
BUN: 17 mg/dL (ref 8–23)
CO2: 27 mmol/L (ref 22–32)
Calcium: 9.3 mg/dL (ref 8.9–10.3)
Chloride: 103 mmol/L (ref 98–111)
Creatinine, Ser: 0.95 mg/dL (ref 0.44–1.00)
GFR, Estimated: >60 mL/min (ref >60)
Glucose, Bld: 102 mg/dL — ABNORMAL HIGH (ref 70–99)
Potassium: 3.6 mmol/L (ref 3.5–5.1)
Sodium: 140 mmol/L (ref 135–145)
Total Bilirubin: 0.4 mg/dL (ref 0.0–1.2)
Total Protein: 7 g/dL (ref 6.5–8.1)

## 2024-05-14 LAB — CBC WITH DIFFERENTIAL/PLATELET
Abs Immature Granulocytes: 0.01 10*3/uL (ref 0.00–0.07)
Basophils Absolute: 0 10*3/uL (ref 0.0–0.1)
Basophils Relative: 1 %
Eosinophils Absolute: 0.2 10*3/uL (ref 0.0–0.5)
Eosinophils Relative: 3 %
HCT: 34 % — ABNORMAL LOW (ref 36.0–46.0)
Hemoglobin: 11 g/dL — ABNORMAL LOW (ref 12.0–15.0)
Immature Granulocytes: 0 %
Lymphocytes Relative: 51 %
Lymphs Abs: 3 10*3/uL (ref 0.7–4.0)
MCH: 31.5 pg (ref 26.0–34.0)
MCHC: 32.4 g/dL (ref 30.0–36.0)
MCV: 97.4 fL (ref 80.0–100.0)
Monocytes Absolute: 0.4 10*3/uL (ref 0.1–1.0)
Monocytes Relative: 6 %
Neutro Abs: 2.3 10*3/uL (ref 1.7–7.7)
Neutrophils Relative %: 39 %
Platelets: 188 10*3/uL (ref 150–400)
RBC: 3.49 MIL/uL — ABNORMAL LOW (ref 3.87–5.11)
RDW: 13.6 % (ref 11.5–15.5)
WBC: 6 10*3/uL (ref 4.0–10.5)
nRBC: 0 % (ref 0.0–0.2)

## 2024-05-14 LAB — TROPONIN I (HIGH SENSITIVITY)
Troponin I (High Sensitivity): 8 ng/L (ref <18)
Troponin I (High Sensitivity): 7 ng/L (ref <18)

## 2024-05-14 LAB — BRAIN NATRIURETIC PEPTIDE: B Natriuretic Peptide: 72 pg/mL (ref 0.0–100.0)

## 2024-05-14 MED ORDER — SODIUM CHLORIDE 0.9 % IV SOLN: 250.0000 mL | INTRAVENOUS | Status: DC | PRN

## 2024-05-14 MED ORDER — IRBESARTAN 300 MG PO TABS
150.0000 mg | ORAL_TABLET | Freq: Every day | ORAL | Status: DC
  Administered 2024-05-14: 150 mg via ORAL
  Filled 2024-05-14: qty 1

## 2024-05-14 MED ORDER — ONDANSETRON HCL 4 MG/2ML IJ SOLN: 4.0000 mg | Freq: Four times a day (QID) | INTRAMUSCULAR | Status: DC | PRN

## 2024-05-14 MED ORDER — DOXYLAMINE SUCCINATE (SLEEP) 25 MG PO TABS: 25.0000 mg | ORAL_TABLET | Freq: Every evening | ORAL | Status: DC | PRN

## 2024-05-14 MED ORDER — ROSUVASTATIN CALCIUM 20 MG PO TABS: 40.0000 mg | ORAL_TABLET | Freq: Every day | ORAL | Status: DC

## 2024-05-14 MED ORDER — ISOSORBIDE MONONITRATE ER 30 MG PO TB24
30.0000 mg | ORAL_TABLET | Freq: Every day | ORAL | Status: DC
  Administered 2024-05-14: 30 mg via ORAL
  Filled 2024-05-14: qty 1

## 2024-05-14 MED ORDER — CLOPIDOGREL BISULFATE 75 MG PO TABS
75.0000 mg | ORAL_TABLET | Freq: Every day | ORAL | Status: DC
  Administered 2024-05-14: 75 mg via ORAL
  Filled 2024-05-14: qty 1

## 2024-05-14 MED ORDER — AMLODIPINE BESYLATE 5 MG PO TABS
10.0000 mg | ORAL_TABLET | Freq: Every day | ORAL | Status: DC
  Administered 2024-05-14: 10 mg via ORAL
  Filled 2024-05-14: qty 2

## 2024-05-14 MED ORDER — ACETAMINOPHEN 325 MG PO TABS: 650.0000 mg | ORAL_TABLET | ORAL | Status: DC | PRN

## 2024-05-14 MED ORDER — FUROSEMIDE 10 MG/ML IJ SOLN: 20.0000 mg | Freq: Two times a day (BID) | INTRAMUSCULAR | Status: DC

## 2024-05-14 MED ORDER — GABAPENTIN 300 MG PO CAPS: 300.0000 mg | ORAL_CAPSULE | Freq: Every day | ORAL | Status: DC

## 2024-05-14 MED ORDER — FUROSEMIDE 20 MG PO TABS: 20.0000 mg | ORAL_TABLET | Freq: Every day | ORAL | 0 refills | Status: DC

## 2024-05-14 MED ORDER — SODIUM CHLORIDE 0.9% FLUSH: 3.0000 mL | INTRAVENOUS | Status: DC | PRN

## 2024-05-14 MED ORDER — FUROSEMIDE 10 MG/ML IJ SOLN
20.0000 mg | Freq: Once | INTRAMUSCULAR | Status: AC
  Administered 2024-05-14: 20 mg via INTRAVENOUS
  Filled 2024-05-14: qty 2

## 2024-05-14 MED ORDER — FUROSEMIDE 10 MG/ML IJ SOLN
20.0000 mg | Freq: Two times a day (BID) | INTRAMUSCULAR | Status: DC
  Administered 2024-05-14: 20 mg via INTRAVENOUS
  Filled 2024-05-14: qty 2

## 2024-05-14 MED ORDER — METOPROLOL SUCCINATE ER 25 MG PO TB24
25.0000 mg | ORAL_TABLET | Freq: Every day | ORAL | Status: DC
  Administered 2024-05-14: 25 mg via ORAL
  Filled 2024-05-14: qty 1

## 2024-05-14 MED ORDER — SODIUM CHLORIDE 0.9% FLUSH
3.0000 mL | Freq: Two times a day (BID) | INTRAVENOUS | Status: DC
  Administered 2024-05-14: 3 mL via INTRAVENOUS

## 2024-05-14 MED ORDER — TRAZODONE HCL 50 MG PO TABS: 100.0000 mg | ORAL_TABLET | Freq: Every day | ORAL | Status: DC

## 2024-05-14 MED ORDER — ENOXAPARIN SODIUM 40 MG/0.4ML IJ SOSY
40.0000 mg | PREFILLED_SYRINGE | INTRAMUSCULAR | Status: DC
  Administered 2024-05-14: 40 mg via SUBCUTANEOUS
  Filled 2024-05-14: qty 0.4

## 2024-05-14 MED ORDER — ASPIRIN 81 MG PO TBEC: 81.0000 mg | DELAYED_RELEASE_TABLET | Freq: Every day | ORAL | Status: AC

## 2024-05-14 NOTE — Telephone Encounter (Signed)
 Pt c/o medication issue:  1. Name of Medication: furosemide (LASIX) injection 20 mg   2. How are you currently taking this medication (dosage and times per day)? N/A  3. Are you having a reaction (difficulty breathing--STAT)? No   4. What is your medication issue? Pt was giving the medication in the hospital pts daughter would like to know of the pt should continue taking this if so please send a script to the pharmacy. Please advise

## 2024-05-14 NOTE — ED Notes (Signed)
 Patient up to restroom.

## 2024-05-14 NOTE — ED Provider Notes (Signed)
 [place] EMERGENCY DEPARTMENT AT [facility] Provider Note   CSN: [id number] Arrival date & time: 05/14/24  9947     Patient presents with: Shortness of Breath   Beth Hubbard is a 78 y.o. female.   HPI     This is a 78 year old female with history of diabetes and coronary artery disease who presents with shortness of breath.  1 day history of worsening shortness of breath specifically when she lays flat.  No known history of heart failure.  Denies leg swelling.  Has had some chest pain in the past but denies any chest pain at this time.  Has not had any recent fevers or cough.  Not normally on oxygen.  Per EMS had an O2 sat of 86% on room air.  She is a non-smoker.  No history of COPD.  Prior to Admission medications   Medication Sig Start Date End Date Taking? Authorizing Provider  ACCU-CHEK AVIVA PLUS test strip Reported on 02/24/2016 09/23/20   Myrna Camelia HERO, NP  Accu-Chek Softclix Lancets lancets Check BS once or twice a day. 09/23/20   Myrna Camelia HERO, NP  acetaminophen  (TYLENOL ) 500 MG tablet Take 500 mg by mouth every 6 (six) hours as needed for mild pain (pain score 1-3) or moderate pain (pain score 4-6).    [provider]  amLODipine  (NORVASC ) 10 MG tablet TAKE 1 TABLET(10 MG) BY MOUTH DAILY 04/02/24   Oley Bascom RAMAN, NP  aspirin  EC 81 MG tablet Take 1 tablet (81 mg total) by mouth daily. Swallow whole. 03/30/24 03/30/25  End, Lonni, MD  blood glucose meter kit and supplies KIT Dispense based on patient and insurance preference. Use up to four times daily as directed. 09/25/22   Oley Bascom RAMAN, NP  Blood Pressure Monitor KIT 1 kit by Does not apply route daily. 03/24/21   Myrna Camelia HERO, NP  brimonidine-timolol (COMBIGAN) 0.2-0.5 % ophthalmic solution Place 1 drop into both eyes 2 (two) times daily. 07/24/22   [provider]  cetirizine  (ZYRTEC ) 10 MG tablet Take 1 tablet (10 mg total) by mouth daily. 08/30/23   Oley Bascom RAMAN, NP   clopidogrel  (PLAVIX ) 75 MG tablet Take 1 tablet (75 mg total) by mouth daily. 03/25/24   End, Lonni, MD  dorzolamide  (TRUSOPT ) 2 % ophthalmic solution Place 1 drop into both eyes 2 (two) times daily. 06/11/22   [provider]  Doxylamine Succinate, Sleep, (SLEEP AID PO) Take 25 mg by mouth at bedtime.    [provider]  gabapentin  (NEURONTIN ) 300 MG capsule Take 1 capsule (300 mg total) by mouth at bedtime. 04/15/24   Oley Bascom RAMAN, NP  hydrOXYzine  (VISTARIL ) 25 MG capsule Take 1 capsule (25 mg total) by mouth at bedtime as needed. 07/12/23   Oley Bascom RAMAN, NP  isosorbide  mononitrate (IMDUR ) 30 MG 24 hr tablet Take 1 tablet (30 mg total) by mouth daily. 04/16/24 07/15/24  Shlomo Wilbert SAUNDERS, MD  metoprolol  succinate (TOPROL  XL) 25 MG 24 hr tablet Take 1 tablet (25 mg total) by mouth daily. 03/30/24 03/30/25  End, Lonni, MD  rosuvastatin  (CRESTOR ) 40 MG tablet Take 1 tablet (40 mg total) by mouth daily. Patient taking differently: Take 40 mg by mouth at bedtime. 03/23/24   Shlomo Wilbert SAUNDERS, MD  sitaGLIPtin  (JANUVIA ) 50 MG tablet TAKE 1 TABLET(50 MG) BY MOUTH DAILY 05/04/24   Nichols, Tonya S, NP  sodium chloride  (MURO 128) 5 % ophthalmic solution Place 1 drop into both eyes 2 (  two) times daily.    [provider]  traZODone  (DESYREL ) 100 MG tablet TAKE 1 TABLET(100 MG) BY MOUTH AT BEDTIME 05/04/24   Nichols, Tonya S, NP  valsartan  (DIOVAN ) 160 MG tablet Take 1 tablet (160 mg total) by mouth at bedtime. 05/05/24   [facility], Jon Garre, PA    Allergies: Aspirin , Aspirin , Ibuprofen, Penicillins, Penicillins, and Nsaids    Review of Systems  Constitutional:  Negative for fever.  Respiratory:  Positive for shortness of breath.   Cardiovascular:  Negative for chest pain and leg swelling.  All other systems reviewed and are negative.   Updated Vital Signs BP (!) 138/58   Pulse 65   Temp 98.3 F (36.8 C) (Oral)   Resp 17   Ht 1.549 m (5' 1)   Wt 68 kg   SpO2 96%    BMI 28.33 kg/m   Physical Exam Vitals and nursing note reviewed.  Constitutional:      Appearance: She is ill-appearing. She is not toxic-appearing.  HENT:     Head: Normocephalic and atraumatic.   Eyes:     Pupils: Pupils are equal, round, and reactive to light.    Cardiovascular:     Rate and Rhythm: Normal rate and regular rhythm.     Heart sounds: Normal heart sounds.  Pulmonary:     Effort: Pulmonary effort is normal. No respiratory distress.     Breath sounds: No wheezing.     Comments: Nasal cannula in place, speaking in full sentences, no respiratory distress noted Abdominal:     General: Bowel sounds are normal.     Palpations: Abdomen is soft.   Musculoskeletal:     Cervical back: Neck supple.     Comments: Trace bilateral lower extremity edema   Skin:    General: Skin is warm and dry.   Neurological:     Mental Status: She is alert and oriented to person, place, and time.   Psychiatric:        Mood and Affect: Mood normal.     (all labs ordered are listed, but only abnormal results are displayed) Labs Reviewed  CBC WITH DIFFERENTIAL/PLATELET - Abnormal; Notable for the following components:      Result Value   RBC 3.49 (*)    Hemoglobin 11.0 (*)    HCT 34.0 (*)    All other components within normal limits  COMPREHENSIVE METABOLIC PANEL WITH GFR - Abnormal; Notable for the following components:   Glucose, Bld 102 (*)    All other components within normal limits  BRAIN NATRIURETIC PEPTIDE  TROPONIN I (HIGH SENSITIVITY)  TROPONIN I (HIGH SENSITIVITY)  TROPONIN I (HIGH SENSITIVITY)    EKG: None  Radiology: DG Chest Portable 1 View Result Date: 05/14/2024 CLINICAL DATA:  Shortness of breath EXAM: PORTABLE CHEST 1 VIEW COMPARISON:  04/21/2021 FINDINGS: Cardiac shadow is mildly prominent but accentuated by the frontal technique. Lungs are well aerated bilaterally. Mild vascular congestion is seen with patchy airspace opacity which may represent  early edema. No sizable effusion is seen. No bony abnormality is noted. IMPRESSION: Findings consistent with mild CHF. Electronically Signed   By: Oneil Devonshire M.D.   On: 05/14/2024 01:53     .Critical Care  Performed by: Bari Charmaine FALCON, MD Authorized by: Bari Charmaine FALCON, MD   Critical care provider statement:    Critical care time (minutes):  35   Critical care was necessary to treat or prevent imminent or life-threatening deterioration of the following conditions:  Cardiac failure   Critical care was time spent personally by me on the following activities:  Development of treatment plan with patient or surrogate, discussions with consultants, evaluation of patient's response to treatment, examination of patient, ordering and review of laboratory studies, ordering and review of radiographic studies, ordering and performing treatments and interventions, pulse oximetry, re-evaluation of patient's condition and review of old charts    Medications Ordered in the ED  furosemide (LASIX) injection 20 mg (20 mg Intravenous Given 05/14/24 0520)    Clinical Course as of 05/14/24 0608  Thu May 14, 2024  0315 Oxygen turned down to 1 L. [CH]  9391 Informed by nursing staff the patient desatted on the way back to the bathroom.  86%.  Will plan for admission for diuresis. [CH]    Clinical Course User Index [CH] Geza Beranek, Charmaine FALCON, MD                                 Medical Decision Making Amount and/or Complexity of Data Reviewed Labs: ordered. Radiology: ordered.  Risk Prescription drug management. Decision regarding hospitalization.   This patient presents to the ED for concern of shortness of breath, this involves an extensive number of treatment options, and is a complaint that carries with it a high risk of complications and morbidity.  I considered the following differential and admission for this acute, potentially life threatening condition.  The differential diagnosis includes  CHF, COPD, pneumonia, pneumothorax, atypical ACS  MDM:    This is a 78 year old female who presents with shortness of breath.  Reports worsening shortness of breath when lying flat.  No history of systolic heart failure but did recently have an echo that showed some diastolic dysfunction and worsening mitral regurgitation.  Does not take Lasix at home.  Does not appear overtly volume overloaded.  Is on supplemental oxygen but not in any respiratory distress.  EKG shows no evidence of acute ischemia arrhythmia.  Initial troponin negative.  BNP is normal.  Chest x-ray is suggestive of mild CHF.  Basic lab work reassuring.  Patient given 20 mg of IV Lasix.  Was able to wean her off oxygen while at rest.  However she desaturated with ambulation.  Will plan for admission for diuresis.  (Labs, imaging, consults)  Labs: I Ordered, and personally interpreted labs.  The pertinent results include: CBC, BMP, troponin, BNP  Imaging Studies ordered: I ordered imaging studies including chest x-ray I independently visualized and interpreted imaging. I agree with the radiologist interpretation  Additional history obtained from chart review.  External records from outside source obtained and reviewed including cardiology notes  Cardiac Monitoring: The patient was maintained on a cardiac monitor.  If on the cardiac monitor, I personally viewed and interpreted the cardiac monitored which showed an underlying rhythm of: Sinus  Reevaluation: After the interventions noted above, I reevaluated the patient and found that they have :stayed the same  Social Determinants of Health:  lives independently  Disposition: Discharge  Co morbidities that complicate the patient evaluation  Past Medical History:  Diagnosis Date   Arthritis    Diabetes mellitus without complication (HCC)    Excessive daytime sleepiness    Glaucoma    High cholesterol    Hyperlipemia    Hypertension    Mitral regurgitation     Moderate by echo 03/2024   OSA (obstructive sleep apnea) 10/18/2015   Mild OSA with AHI 12.5/hr on  CPAP at 8cm H2O   Pneumonia    Shingles    TIA (transient ischemic attack)      Medicines Meds ordered this encounter  Medications   furosemide (LASIX) injection 20 mg    I have reviewed the patients home medicines and have made adjustments as needed  Problem List / ED Course: Problem List Items Addressed This Visit   None Visit Diagnoses       Acute on chronic diastolic heart failure (HCC)    -  Primary   Relevant Medications   furosemide (LASIX) injection 20 mg (Completed)     Hypoxia                    Final diagnoses:  Acute on chronic diastolic heart failure [facility])  Hypoxia    ED Discharge Orders     None          Bari Charmaine FALCON, MD 05/14/24 [phone]

## 2024-05-14 NOTE — ED Notes (Signed)
 Pt stated that she wants to go home, provider aware.

## 2024-05-14 NOTE — Progress Notes (Signed)
 Physical Therapy Evaluation Patient Details Name: Beth Hubbard MRN: [id number] DOB: 1946/03/09 Today's Date: 05/14/2024  History of Present Illness  Beth Hubbard is a 78 y.o. female admitted 6/26 with SOB and chest discomfort.  PMH: hypertension, hyperlipidemia, CAD, moderate mitral regurgitation, TIA, diabetes mellitus type 2, and OSA  Clinical Impression  Pt admitted with above diagnosis. Pt was able to ambulate without assist and with good balance overall.  Suggested pt use RW initially (pt has a RW at home) for incr safety until she feels back to her baseline as she was a little slow and cautious today. REcommended HHPT as well.  BPs fluctuating and are in VS flowsheet. Pt was not symptomatic.  O2 saturation remained > 90% at rest and with activity. Will continue to follow acutely.  Pt currently with functional limitations due to the deficits listed below (see PT Problem List). Pt will benefit from acute skilled PT to increase their independence and safety with mobility to allow discharge.           If plan is discharge home, recommend the following: Assistance with cooking/housework;Assist for transportation;Help with stairs or ramp for entrance   Can travel by private vehicle        Equipment Recommendations None recommended by PT  Recommendations for Other Services       Functional Status Assessment Patient has had a recent decline in their functional status and demonstrates the ability to make significant improvements in function in a reasonable and predictable amount of time.     Precautions / Restrictions Precautions Precautions: Fall Restrictions Weight Bearing Restrictions Per Provider Order: No      Mobility  Bed Mobility Overal bed mobility: Independent                  Transfers Overall transfer level: Independent                      Ambulation/Gait Ambulation/Gait assistance: Contact guard assist Gait Distance (Feet): 110 Feet Assistive  device: 1 person hand held assist, None Gait Pattern/deviations: Step-through pattern, Decreased stride length, Trunk flexed   Gait velocity interpretation: <1.31 ft/sec, indicative of household ambulator   General Gait Details: Pt able to ambulate short distance to restroom and back to room without device without LOB.  pt can withstand min challenges but has more difficulty with mod challenges to balance.  Stairs            Wheelchair Mobility     Tilt Bed    Modified Rankin (Stroke Patients Only)       Balance Overall balance assessment: Needs assistance Sitting-balance support: No upper extremity supported, Feet supported Sitting balance-Leahy Scale: Good     Standing balance support: No upper extremity supported, During functional activity Standing balance-Leahy Scale: Fair Standing balance comment: No LOB with min challenges                             Pertinent Vitals/Pain Pain Assessment Pain Assessment: Faces Faces Pain Scale: Hurts little more Pain Location: LEFT BREAST UNDERNEATH Pain Descriptors / Indicators: Aching, Discomfort, Grimacing, Guarding Pain Intervention(s): Limited activity within patient's tolerance, Monitored during session, Repositioned    Home Living Family/patient expects to be discharged to:: Private residence Living Arrangements: Alone Available Help at Discharge: Family;Available PRN/intermittently Type of Home: Apartment Home Access: Level entry       Home Layout: One level Home Equipment: Cane - single  point;BSC/3in1;Rolling Walker (2 wheels);Shower seat - built in;Grab bars - tub/shower;Hand held shower head      Prior Function Prior Level of Function : Independent/Modified Independent             Mobility Comments: USED CANE AT HOME AND WAS MODIF I ADLs Comments: B/D BY DAUGHTER, CHILDREN CLEANS, DAUGHTER DOES COOKING     Extremity/Trunk Assessment   Upper Extremity Assessment Upper Extremity  Assessment: Defer to OT evaluation    Lower Extremity Assessment Lower Extremity Assessment: Generalized weakness    Cervical / Trunk Assessment Cervical / Trunk Assessment: Kyphotic  Communication   Communication Communication: No apparent difficulties    Cognition Arousal: Lethargic Behavior During Therapy: Flat affect   PT - Cognitive impairments: No apparent impairments                         Following commands: Intact       Cueing       General Comments General comments (skin integrity, edema, etc.): 60 BPM, 99% ra, 116/84. SITTING 62 BPM, 181/59; STAndING 65 BPM, 152/67; STANDING 3 MIN 66 BPM, 154/64.  END OF SESSION IN SUPINE 96/76, 72 BPM    Exercises     Assessment/Plan    PT Assessment Patient needs continued PT services  PT Problem List Decreased activity tolerance;Decreased balance;Decreased mobility;Decreased knowledge of use of DME;Decreased safety awareness;Decreased knowledge of precautions;Cardiopulmonary status limiting activity       PT Treatment Interventions DME instruction;Gait training;Functional mobility training;Therapeutic activities;Balance training;Therapeutic exercise;Patient/family education    PT Goals (Current goals can be found in the Care Plan section)  Acute Rehab PT Goals Patient Stated Goal: to go home PT Goal Formulation: With patient Time For Goal Achievement: 05/28/24 Potential to Achieve Goals: Good    Frequency Min 2X/week     Co-evaluation               AM-PAC PT 6 Clicks Mobility  Outcome Measure Help needed turning from your back to your side while in a flat bed without using bedrails?: None Help needed moving from lying on your back to sitting on the side of a flat bed without using bedrails?: None Help needed moving to and from a bed to a chair (including a wheelchair)?: A Little Help needed standing up from a chair using your arms (e.g., wheelchair or bedside chair)?: A Little Help needed to  walk in hospital room?: A Little Help needed climbing 3-5 steps with a railing? : A Little 6 Click Score: 20    End of Session Equipment Utilized During Treatment: Gait belt Activity Tolerance: Patient tolerated treatment well Patient left: with call bell/phone within reach (on stretcher) Nurse Communication: Mobility status PT Visit Diagnosis: Muscle weakness (generalized) (M62.81)    Time: 9086-9064 PT Time Calculation (min) (ACUTE ONLY): 22 min   Charges:   PT Evaluation $PT Eval Moderate Complexity: 1 Mod   PT General Charges $$ ACUTE PT VISIT: 1 Visit         Younes Degeorge M,PT Acute Rehab Services [phone]   Stephane JULIANNA Bevel 05/14/2024, 11:56 AM

## 2024-05-14 NOTE — ED Notes (Signed)
 Patient 02 drop to 86% while walking from bathroom.

## 2024-05-14 NOTE — H&P (Signed)
 History and Physical    Patient: Beth Hubbard FMW:[id number] DOB: 1946/02/07 DOA: 05/14/2024 DOS: the patient was seen and examined on 05/14/2024 PCP: Oley Bascom RAMAN, NP  Patient coming from: Home lives alone via EMS  Chief Complaint:  Chief Complaint  Patient presents with   Shortness of Breath   HPI: Beth Hubbard is a 78 y.o. female with medical history significant of hypertension, hyperlipidemia, CAD, moderate mitral regurgitation, TIA, diabetes mellitus type 2, and OSA  presents with shortness of breath and chest discomfort.   She experienced shortness of breath and chest discomfort last night between 10 PM and 12 AM. The discomfort initially started under her left breast and later moved to the center of her chest. The symptoms were severe enough to prompt her to call 911. This was the first occurrence of such symptoms, which happened while she was lying down. She attempted to alleviate the symptoms by standing and walking around, but the shortness of breath returned whenever she tried to lay down again.    Last month, she underwent a cardiac catheterization on 5/12 with Dr. Mady which showed 75% blockage to the proximal LAD.  Records note metoprolol  25 mg daily and 81 mg aspirin  were added to her medication regimen.  Previously aspirin  had been stopped due to tinnitus/tremor.  Labs were  sent off cytochrome P4 52C 19 genotype to ensure patient is appropriate Plavix  responder.  Patient reports that she was not taking aspirin  as she was told to discontinue it.  No history of smoking or alcohol use. She notes swelling in her feet. No fevers or chills prior to her hospital visit. She did not take any medication like nitroglycerin  during the episode of shortness of breath.  En route with EMS patient was noted to have O2 saturations of 86% on room air and placed on 4 L of nasal cannula oxygen.  In the emergency department patient was noted to be afebrile with tachypnea and O2 saturations  noted to be as low as 86% room air while walking to the bathroom with improvement on nasal cannula oxygen.  Labs noted hemoglobin 11, BNP 72, and high-sensitivity troponin negative x 2.  Chest x-ray noted findings consistent with mild CHF.  Patient had been given Lasix 20 mg IV x 1 dose.  TRH called to admit.  Review of Systems: As mentioned in the history of present illness. All other systems reviewed and are negative. Past Medical History:  Diagnosis Date   Arthritis    Diabetes mellitus without complication (HCC)    Excessive daytime sleepiness    Glaucoma    High cholesterol    Hyperlipemia    Hypertension    Mitral regurgitation    Moderate by echo 03/2024   OSA (obstructive sleep apnea) 10/18/2015   Mild OSA with AHI 12.5/hr on CPAP at 8cm H2O   Pneumonia    Shingles    TIA (transient ischemic attack)    Past Surgical History:  Procedure Laterality Date   ABDOMINAL SURGERY     BICEPT TENODESIS Left 01/11/2023   Procedure: BICEPS TENODESIS;  Surgeon: Yvone Rush, MD;  Location: [facility];  Service: Orthopedics;  Laterality: Left;   CANTHOPLASTY Right 03/30/2022   Procedure: CANTHOPLASTY;  Surgeon: Rodgers Faden, MD;  Location: [facility] OR;  Service: Ophthalmology;  Laterality: Right;   EYE EXAMINATION UNDER ANESTHESIA Right 03/30/2022   Procedure: EYE EXAM UNDER ANESTHESIA;  Surgeon: Rodgers Faden, MD;  Location: [facility] OR;  Service: Ophthalmology;  Laterality:  Right;   EYE SURGERY Bilateral    cataract surgery   LEFT HEART CATH AND CORONARY ANGIOGRAPHY N/A 03/30/2024   Procedure: LEFT HEART CATH AND CORONARY ANGIOGRAPHY;  Surgeon: Mady Bruckner, MD;  Location: [facility];  Service: Cardiovascular;  Laterality: N/A;   ORIF ORBITAL FRACTURE Right 03/30/2022   Procedure: ORBITAL FLOOR FRACTURE REPAIR WITH IMPLANT;  Surgeon: Rodgers Faden, MD;  Location: [facility] OR;  Service: Ophthalmology;  Laterality: Right;   ROTATOR CUFF REPAIR     bilateral   SHOULDER  ARTHROSCOPY WITH OPEN ROTATOR CUFF REPAIR AND DISTAL CLAVICLE ACROMINECTOMY Left 01/11/2023   Procedure: ARTHROSCOPY SHOULDER MINI OPEN ROTATOR CUFF REPAIR, SUBACROMIAL DECOMPRESSION AND DISTAL CLAVICLE RESECTION;  Surgeon: Yvone Rush, MD;  Location: [facility];  Service: Orthopedics;  Laterality: Left;   TEE WITHOUT CARDIOVERSION N/A 07/20/2016   Procedure: TRANSESOPHAGEAL ECHOCARDIOGRAM (TEE);  Surgeon: Vinie JAYSON Maxcy, MD;  Location: [facility] ENDOSCOPY;  Service: Cardiovascular;  Laterality: N/A;   TOOTH EXTRACTION Left 10/09/2019   Procedure: DENTAL EXTRACTION X1 and Irrigation and Debridement.;  Surgeon: Sheryle Hamilton, DDS;  Location: [facility] OR;  Service: Oral Surgery;  Laterality: Left;  DENTAL EXTRACTION X1 and Irrigation and Debridement.   Social History:  reports that she has never smoked. She has never used smokeless tobacco. She reports that she does not drink alcohol and does not use drugs.  Allergies  Allergen Reactions   Aspirin      Tremor    Aspirin  Other (See Comments)    Ringing in Ear   Ibuprofen Other (See Comments)    Ringing in Ear   Penicillins Itching and Other (See Comments)    Reaction: itching,headache, dizziness and anxiety. Feels like she is goin off Did it involve swelling of the face/tongue/throat, SOB, or low BP? No Did it involve sudden or severe rash/hives, skin peeling, or any reaction on the inside of your mouth or nose? No Did you need to seek medical attention at a hospital or doctor's office? No When did it last happen?      Childhood If all above answers are "NO", may proceed with cephalosporin use.   Penicillins Other (See Comments)    Patient fainted   Nsaids Anxiety    Reaction: causes dizziness and headache. Ibuprofen. Aspirin     Family History  Problem Relation Age of Onset   Cancer Father    Cancer Sister    Cancer Brother    Breast cancer Neg Hx    Cancer Sister    Cancer Brother     Prior to Admission medications    Medication Sig Start Date End Date Taking? Authorizing Provider  acetaminophen  (TYLENOL ) 500 MG tablet Take 500 mg by mouth every 6 (six) hours as needed for mild pain (pain score 1-3) or moderate pain (pain score 4-6).   Yes [provider]  Doxylamine Succinate, Sleep, (SLEEP AID PO) Take 25 mg by mouth at bedtime.   Yes [provider]  amLODipine  (NORVASC ) 10 MG tablet TAKE 1 TABLET(10 MG) BY MOUTH DAILY 04/02/24   Nichols, Tonya S, NP  brimonidine-timolol (COMBIGAN) 0.2-0.5 % ophthalmic solution Place 1 drop into both eyes 2 (two) times daily. 07/24/22   [provider]  cetirizine  (ZYRTEC ) 10 MG tablet Take 1 tablet (10 mg total) by mouth daily. 08/30/23   Oley Bascom RAMAN, NP  clopidogrel  (PLAVIX ) 75 MG tablet Take 1 tablet (75 mg total) by mouth daily. 03/25/24   End, Bruckner, MD  dorzolamide  (TRUSOPT ) 2 % ophthalmic solution  Place 1 drop into both eyes 2 (two) times daily. 06/11/22   [provider]  gabapentin  (NEURONTIN ) 300 MG capsule Take 1 capsule (300 mg total) by mouth at bedtime. 04/15/24   Oley Bascom RAMAN, NP  hydrOXYzine  (VISTARIL ) 25 MG capsule Take 1 capsule (25 mg total) by mouth at bedtime as needed. 07/12/23   Oley Bascom RAMAN, NP  isosorbide  mononitrate (IMDUR ) 30 MG 24 hr tablet Take 1 tablet (30 mg total) by mouth daily. 04/16/24 07/15/24  Shlomo Wilbert SAUNDERS, MD  metoprolol  succinate (TOPROL  XL) 25 MG 24 hr tablet Take 1 tablet (25 mg total) by mouth daily. 03/30/24 03/30/25  End, Lonni, MD  rosuvastatin  (CRESTOR ) 40 MG tablet Take 1 tablet (40 mg total) by mouth daily. Patient taking differently: Take 40 mg by mouth at bedtime. 03/23/24   Shlomo Wilbert SAUNDERS, MD  sitaGLIPtin  (JANUVIA ) 50 MG tablet TAKE 1 TABLET(50 MG) BY MOUTH DAILY 05/04/24   Nichols, Tonya S, NP  sodium chloride  (MURO 128) 5 % ophthalmic solution Place 1 drop into both eyes 2 (two) times daily.    [provider]  traZODone  (DESYREL ) 100 MG tablet TAKE 1 TABLET(100 MG)  BY MOUTH AT BEDTIME 05/04/24   Nichols, Tonya S, NP  valsartan  (DIOVAN ) 160 MG tablet Take 1 tablet (160 mg total) by mouth at bedtime. 05/05/24   Madie Jon Garre, PA    Physical Exam: Vitals:   05/14/24 0530 05/14/24 0550 05/14/24 0600 05/14/24 0630  BP:   (!) 138/58   Pulse: 66  65 62  Resp: 19  17 19   Temp:      TempSrc:      SpO2: 92% (!) 86% 96% 94%  Weight:      Height:        Constitutional: Elderly female currently in no acute distress Eyes: PERRL, lids and conjunctivae normal ENMT: Mucous membranes are moist.  Poor dentition. Neck: normal, supple Respiratory: Normal respiratory effort with some crackles appreciated in both lung fields. Cardiovascular: Regular rate and rhythm, positive systolic murmur present 2/6.SABRA  No significant lower extremity edema. Abdomen: no tenderness, no masses palpated.  Bowel sounds positive.  Musculoskeletal: no clubbing / cyanosis. No joint deformity upper and lower extremities. Good ROM, no contractures. Normal muscle tone.  Skin: no rashes, lesions, ulcers. No induration Neurologic: CN 2-12 grossly intact. Strength 5/5 in all 4.  Psychiatric: Normal judgment and insight. Alert and oriented x 3. Normal mood.   Data Reviewed:  EKG reveals sinus rhythm at 69 bpm with LVH.  Reviewed labs, imaging, and pertinent records as documented.  Assessment and Plan: Acute respiratory failure with hypoxia secondary to Heart failure with preserved ejection fraction Patient presents with complaints of shortness of breath and orthopnea.  BNP was noted to be within normal limits.  Chest x-ray noted mild congestive heart failure.  Last echocardiogram noted EF to be 60 to 65% with grade 1 diastolic dysfunction when checked on 04/07/2024.  Patient had been given Lasix 20 mg IV. - Admit to a cardiac telemetry bed - Heart failure order set utilized - Strict I&Os and daily weights - Lasix 20 mg IV twice daily - Continue beta-blocker and ARB - Cardiology  consulted, will follow-up for any further recommendations   CAD Patient reports having chest pain underneath her left breast which moved to the center of her chest.  High-sensitivity troponins were negative x 2.  Just underwent cardiac catheterization on 03/30/2024 which showed 75% proximal LAD stenosis.  It appears she had  been recommended to resume aspirin  with attempts at dual antiplatelet therapy in case of need of PCI, but patient makes note that she may have stopped the aspirin  - Continue Plavix , isosorbide  mononitrate, beta-blocker, and statin - Patient seems to be unclear in regards to aspirin .  Resume after discussions if deemed medically appropriate.    Essential hypertension Blood pressures noted to be elevated up to 153/68. - Continue amlodipine , metoprolol , and pharmacy substitution for olmesartan  Normocytic anemia Hemoglobin 11 which appears around patient baseline. - Continue to monitor  Hyperlipidemia Last lipid panel noted total cholesterol 174, HDL 53, LDL 98, triglycerides 128 when checked on 12/09/2023. - Continue Crestor   Controlled diabetes mellitus type 2, without long-term use of insulin  On admission glucose 102.  Last hemoglobin A1c noted to be 5.8.  Patient is on Januvia . - Heart healthy and carb modified diet - Hold Januvia   DVT prophylaxis: Lovenox Advance Care Planning:   Code Status: Full Code    Consults: Cardiology  Family Communication: Patient's daughter updated over the phone  Severity of Illness: The appropriate patient status for this patient is OBSERVATION. Observation status is judged to be reasonable and necessary in order to provide the required intensity of service to ensure the patient's safety. The patient's presenting symptoms, physical exam findings, and initial radiographic and laboratory data in the context of their medical condition is felt to place them at decreased risk for further clinical deterioration. Furthermore, it is anticipated  that the patient will be medically stable for discharge from the hospital within 2 midnights of admission.   Author: Maximino DELENA Sharps, MD 05/14/2024 7:42 AM  For on call review [email or web address].

## 2024-05-14 NOTE — Progress Notes (Signed)
 SATURATION QUALIFICATIONS: (This note is used to comply with regulatory documentation for home oxygen)  Patient Saturations on Room Air at Rest = 95%  Patient Saturations on Room Air while Ambulating = 90%  Please briefly explain why patient needs home oxygen: Does not desaturate on RA at rest or with activity.  Does not need home O2. Beth Hubbard M,PT Acute Rehab Services [phone]

## 2024-05-14 NOTE — Consult Note (Signed)
 Cardiology Consultation   Patient ID: Beth Hubbard MRN: [id number]; DOB: 1946-10-29  Admit date: 05/14/2024 Date of Consult: 05/14/2024  PCP:  Oley Bascom RAMAN, NP   [facility] Providers Cardiologist:  Beth Bihari, MD  Cardiology APP:  Beth Jon Garre, PA      Patient Profile: Beth Hubbard is a 78 y.o. female with a hx of hypertension, hyperlipidemia, CAD, moderate MR, history of TIA, type 2 diabetes, OSA who is being seen 05/14/2024 for the evaluation of chest pain, CHF at the request of Dr. Claudene.  History of Present Illness: Beth Hubbard is a 78 year old female with above medical history who is followed by Dr. Bihari.  Patient previously had nuclear stress test in 2015 that was a normal study.  Echocardiogram in 2017 showed EF 65-70%, grade 1 DD, severe mitral valve regurgitation.Patient was sent for TEE in 2017 that showed mild tethering of the posterior leaflet of the mitral valve, moderate mitral valve regurgitation.  Echocardiogram in 2018 showed mild mitral valve regurgitation  In 02/2024, patient underwent coronary CTA that showed a coronary calcium  score of 164 (73rd percentile), moderate stenosis in proximal LAD, minimal stenosis in proximal left circumflex and proximal RCA.  Study was sent for FFR, FFR 0.78 across lesion proximal LAD.  This was borderline for functional significance.  Given location, recommended cardiac catheterization.  She underwent left heart catheterization 03/30/2024 that showed severe single-vessel CAD with focal 75% stenosis in proximal LAD, otherwise no angiographically significant coronary artery disease.  Patient was started on metoprolol  succinate 25 mg daily for antianginal therapy.  If patient continued to have chest pain/dyspnea despite aggressive anginal therapy, could consider PCI to proximal LAD.  Patient previously had tremor/tinnitus with aspirin , decision was made to rechallenge with aspirin  81 mg daily.  Remained on Plavix  75 mg  daily.  Echocardiogram 04/07/2024 showed EF 60-65%, no regional wall motion abnormalities, grade 1 diastolic dysfunction, mildly reduced RV systolic function, normal PA systolic pressure, moderate mitral valve regurgitation. Patient called the office with some persistent chest pain and was started on Imdur .   Patient was last seen by cardiology on 05/05/2024.  At that time, patient was no longer having chest pain on imdur . Cytochrome P450 2C19 genotype is consistent with ultrarapid activity suggesting strong platelet metabolic activity. Recommended continuing aspirin  and plavix  for 3 months then plavix  monotherapy. Remained on amlodipine , imdur , valsartan , BB  Patient presented to the ED on 6/26 complaining of shortness of breath. Per EMS, she had an O2 sat of 86% on room air. Initial vital signs in the ED showed BP 153/68, oxygen 89% on room air, HR 66 BPM. EKG in the ED showed sinus rhythm with HR 69 BPM, no changes when compared to previous EKG.   Labs in the ED significant for  - K 3.6 - Creatinine 0.95 - WBC 6.0 - Hemoglobin 11.0 - Platelets 188 - BNP 72 - hsTn 8>7  CXR showed findings consistent with mild CHF. In the ED, she received IV lasix 20 mg. Was able to be weaned off nasal cannula at rest, but continued to have some shortness of breath on exertion. She was admitted to the internal medicine service and cardiology was consulted.   On interview, patient reports that she has been having intermittent episodes of chest pain for a while now. Chest pain is located in the center of her chest and under her left breast. Seems to worsen when she moves her shoulder and pain is reproducible on palpation. Pain is  not exertional. She has been having shortness of breath with exertion for a few days. At first, shortness of breath only occurred with exertion. Yesterday, she started to have orthopnea as well. Denies ankle edema. During my interview, patient tells me multiple times that she would like to  go home. She lives independently, but her children and their spouses check on her frequently.   Past Medical History:  Diagnosis Date   Arthritis    Diabetes mellitus without complication (HCC)    Excessive daytime sleepiness    Glaucoma    High cholesterol    Hyperlipemia    Hypertension    Mitral regurgitation    Moderate by echo 03/2024   OSA (obstructive sleep apnea) 10/18/2015   Mild OSA with AHI 12.5/hr on CPAP at 8cm H2O   Pneumonia    Shingles    TIA (transient ischemic attack)     Past Surgical History:  Procedure Laterality Date   ABDOMINAL SURGERY     BICEPT TENODESIS Left 01/11/2023   Procedure: BICEPS TENODESIS;  Surgeon: Yvone Rush, MD;  Location: [facility];  Service: Orthopedics;  Laterality: Left;   CANTHOPLASTY Right 03/30/2022   Procedure: CANTHOPLASTY;  Surgeon: Rodgers Faden, MD;  Location: [facility] OR;  Service: Ophthalmology;  Laterality: Right;   EYE EXAMINATION UNDER ANESTHESIA Right 03/30/2022   Procedure: EYE EXAM UNDER ANESTHESIA;  Surgeon: Rodgers Faden, MD;  Location: [facility] OR;  Service: Ophthalmology;  Laterality: Right;   EYE SURGERY Bilateral    cataract surgery   LEFT HEART CATH AND CORONARY ANGIOGRAPHY N/A 03/30/2024   Procedure: LEFT HEART CATH AND CORONARY ANGIOGRAPHY;  Surgeon: Mady Bruckner, MD;  Location: [facility];  Service: Cardiovascular;  Laterality: N/A;   ORIF ORBITAL FRACTURE Right 03/30/2022   Procedure: ORBITAL FLOOR FRACTURE REPAIR WITH IMPLANT;  Surgeon: Rodgers Faden, MD;  Location: [facility] OR;  Service: Ophthalmology;  Laterality: Right;   ROTATOR CUFF REPAIR     bilateral   SHOULDER ARTHROSCOPY WITH OPEN ROTATOR CUFF REPAIR AND DISTAL CLAVICLE ACROMINECTOMY Left 01/11/2023   Procedure: ARTHROSCOPY SHOULDER MINI OPEN ROTATOR CUFF REPAIR, SUBACROMIAL DECOMPRESSION AND DISTAL CLAVICLE RESECTION;  Surgeon: Yvone Rush, MD;  Location: [facility];  Service: Orthopedics;  Laterality: Left;   TEE  WITHOUT CARDIOVERSION N/A 07/20/2016   Procedure: TRANSESOPHAGEAL ECHOCARDIOGRAM (TEE);  Surgeon: Beth JAYSON Maxcy, MD;  Location: [facility];  Service: Cardiovascular;  Laterality: N/A;   TOOTH EXTRACTION Left 10/09/2019   Procedure: DENTAL EXTRACTION X1 and Irrigation and Debridement.;  Surgeon: Sheryle Hamilton, DDS;  Location: [facility] OR;  Service: Oral Surgery;  Laterality: Left;  DENTAL EXTRACTION X1 and Irrigation and Debridement.     Scheduled Meds:  amLODipine   10 mg Oral Daily   clopidogrel   75 mg Oral Daily   enoxaparin (LOVENOX) injection  40 mg Subcutaneous Q24H   furosemide  20 mg Intravenous BID   gabapentin   300 mg Oral QHS   irbesartan  150 mg Oral Daily   isosorbide  mononitrate  30 mg Oral Daily   metoprolol  succinate  25 mg Oral Daily   rosuvastatin   40 mg Oral QHS   sodium chloride  flush  3 mL Intravenous Q12H   traZODone   100 mg Oral QHS   Continuous Infusions:  sodium chloride      PRN Meds: sodium chloride , acetaminophen , doxylamine (Sleep), ondansetron  (ZOFRAN ) IV, sodium chloride  flush  Allergies:    Allergies  Allergen Reactions   Aspirin      Tremor    Aspirin  Other (  See Comments)    Ringing in Ear   Ibuprofen Other (See Comments)    Ringing in Ear   Penicillins Itching and Other (See Comments)    Reaction: itching,headache, dizziness and anxiety. Feels like she is goin off Did it involve swelling of the face/tongue/throat, SOB, or low BP? No Did it involve sudden or severe rash/hives, skin peeling, or any reaction on the inside of your mouth or nose? No Did you need to seek medical attention at a hospital or doctor's office? No When did it last happen?      Childhood If all above answers are "NO", may proceed with cephalosporin use.   Penicillins Other (See Comments)    Patient fainted   Nsaids Anxiety    Reaction: causes dizziness and headache. Ibuprofen. Aspirin     Social History:   Social History   Socioeconomic History   Marital status:  Legally Separated    Spouse name: Not on file   Number of children: Not on file   Years of education: Not on file   Highest education level: Not on file  Occupational History   Not on file  Tobacco Use   Smoking status: Never   Smokeless tobacco: Never  Vaping Use   Vaping status: Never Used  Substance and Sexual Activity   Alcohol use: Never   Drug use: Never   Sexual activity: Not Currently  Other Topics Concern   Not on file  Social History Narrative   ** Merged History Encounter **       Social Drivers of Health   Financial Resource Strain: Low Risk  (01/30/2024)   Overall Financial Resource Strain (CARDIA)    Difficulty of Paying Living Expenses: Not hard at all  Food Insecurity: No Food Insecurity (01/30/2024)   Hunger Vital Sign    Worried About Running Out of Food in the Last Year: Never true    Ran Out of Food in the Last Year: Never true  Transportation Needs: No Transportation Needs (01/30/2024)   PRAPARE - Administrator, Civil Service (Medical): No    Lack of Transportation (Non-Medical): No  Physical Activity: Sufficiently Active (01/30/2024)   Exercise Vital Sign    Days of Exercise per Week: 7 days    Minutes of Exercise per Session: 30 min  Recent Concern: Physical Activity - Insufficiently Active (12/13/2023)   Exercise Vital Sign    Days of Exercise per Week: 2 days    Minutes of Exercise per Session: 20 min  Stress: No Stress Concern Present (01/30/2024)   [employer] of Occupational Health - Occupational Stress Questionnaire    Feeling of Stress : Not at all  Social Connections: Moderately Integrated (01/30/2024)   Social Connection and Isolation Panel    Frequency of Communication with Friends and Family: More than three times a week    Frequency of Social Gatherings with Friends and Family: More than three times a week    Attends Religious Services: More than 4 times per year    Active Member of [employer] or Organizations: Yes     Attends Banker Meetings: More than 4 times per year    Marital Status: Separated  Intimate Partner Violence: Not At Risk (01/30/2024)   Humiliation, Afraid, Rape, and Kick questionnaire    Fear of Current or Ex-Partner: No    Emotionally Abused: No    Physically Abused: No    Sexually Abused: No    Family History:    Family History  Problem Relation Age of Onset   Cancer Father    Cancer Sister    Cancer Brother    Breast cancer Neg Hx    Cancer Sister    Cancer Brother      ROS:  Please see the history of present illness.   All other ROS reviewed and negative.     Physical Exam/Data: Vitals:   05/14/24 0630 05/14/24 0700 05/14/24 0830 05/14/24 0945  BP:  (!) 150/58  (!) 154/127  Pulse: 62 62  63  Resp: 19   18  Temp:   98.6 F (37 C)   TempSrc:   Oral   SpO2: 94% 93%  98%  Weight:      Height:       No intake or output data in the 24 hours ending 05/14/24 1139    05/14/2024   12:56 AM 05/05/2024    7:50 AM 03/30/2024    7:18 AM  Last 3 Weights  Weight (lbs) 149 lb 14.6 oz 150 lb 150 lb  Weight (kg) 68 kg 68.04 kg 68.04 kg     Body mass index is 28.33 kg/m.  General:  Well nourished, well developed, in no acute distress. Laying in the bed with her head elevated  HEENT: normal Neck: no JVD Vascular: Radial pulses 2+ bilaterally Cardiac:  normal S1, S2; RRR; no murmur. Chest wall is tender to palpation over the sternum and under left breast  Lungs:  clear to auscultation bilaterally, no wheezing, rhonchi or rales. Normal WOB on room air  Abd: soft, nontender  Ext: no edema in BLE  Musculoskeletal:  No deformities, BUE and BLE strength normal and equal Skin: warm and dry  Neuro:  CNs 2-12 intact, no focal abnormalities noted Psych:  Normal affect   EKG:  The EKG was personally reviewed and demonstrates:  sinus rhythm with HR 69 BPM, no changes when compared to previous EKG Telemetry:  Telemetry was personally reviewed and demonstrates:  NSR    Relevant CV Studies: Cardiac Studies & Procedures   ______________________________________________________________________________________________ CARDIAC CATHETERIZATION  CARDIAC CATHETERIZATION 03/30/2024  Conclusion Conclusions: Severe single-vessel coronary artery disease with focal 75% stenosis in the proximal LAD.  Otherwise, no angiographically significant coronary artery disease. Normal left ventricular systolic function (LVEF 55-65%) and filling pressure (LVEDP 8 mmHg).  Recommendations: Add metoprolol  succinate 25 mg daily for antianginal therapy.  If the patient continues to have chest pain/dyspnea despite aggressive antianginal therapy, PCI to proximal LAD will need to be considered once antiplatelet strategy has been finalized. Rechallange with aspirin  81 mg daily, given remote history of tinnitus/tremor with aspirin . Continue clopidogrel  75 mg daily; cytochrome P450 2C19 genotype sent to ensure that the patient is an appropriate clopidogrel  responder.  If the patient has unfavorable genotype, ticagrelor +/- aspirin  (based on patient's ability to tolerate) would need to be utilized if PCI is pursued in the future.  Beth Hanson, MD [facility]  Findings Coronary Findings Diagnostic  Dominance: Right  Left Main Vessel is large.  Left Anterior Descending Vessel is large. Prox LAD to Mid LAD lesion is 75% stenosed. The lesion is focal and eccentric.  First Diagonal Branch Vessel is small in size.  Second Diagonal Branch Vessel is large in size.  Third Diagonal Branch Vessel is small in size.  Left Circumflex Vessel is large. Vessel is angiographically normal.  First Obtuse Marginal Branch Vessel is large in size.  Second Obtuse Marginal Branch Vessel is small in size.  Right Coronary Artery Vessel is  large. Vessel is angiographically normal.  Right Posterior Descending Artery Vessel is large in size.  Right Posterior Atrioventricular  Artery Vessel is moderate in size.  First Right Posterolateral Branch Vessel is small in size.  Second Right Posterolateral Branch Vessel is small in size.  Third Right Posterolateral Branch Vessel is small in size.  Intervention  No interventions have been documented.     ECHOCARDIOGRAM  ECHOCARDIOGRAM COMPLETE 04/07/2024  Narrative ECHOCARDIOGRAM REPORT    Patient Name:   Beth Hubbard Date of Exam: 04/07/2024 Medical Rec #:  [id number]     Height:       62.0 in Accession #:    [id number]    Weight:       150.0 lb Date of Birth:  Sep 24, 1946    BSA:          1.692 m Patient Age:    40 years      BP:           147/64 mmHg Patient Gender: F             HR:           63 bpm. Exam Location:  [address]  Procedure: 2D Echo, Color Doppler, Cardiac Doppler, 3D Echo and Strain Analysis (Both Spectral and Color Flow Doppler were utilized during procedure).  Indications:    Chest Pain R07.9  History:        Patient has prior history of Echocardiogram examinations, most recent 04/11/2017. Risk Factors:Diabetes, Hypertension and Dyslipidemia.  Sonographer:    Augustin Seals RDCS Referring Phys: [phone] TRACI R TURNER  IMPRESSIONS   1. Left ventricular ejection fraction, by estimation, is 60 to 65%. Left ventricular ejection fraction by 3D volume is 62 %. The left ventricle has normal function. The left ventricle has no regional wall motion abnormalities. Left ventricular diastolic parameters are consistent with Grade I diastolic dysfunction (impaired relaxation). Elevated left ventricular end-diastolic pressure. The average left ventricular global longitudinal strain is -22.2 %. The global longitudinal strain is normal. 2. Right ventricular systolic function is mildly reduced. The right ventricular size is normal. There is normal pulmonary artery systolic pressure. The estimated right ventricular systolic pressure is 27.0 mmHg. 3. Left atrial size was moderately  dilated. 4. The mitral valve is degenerative. Moderate mitral valve regurgitation. 5. The aortic valve is tricuspid. Aortic valve regurgitation is not visualized. Aortic valve sclerosis is present, with no evidence of aortic valve stenosis. 6. The inferior vena cava is normal in size with greater than 50% respiratory variability, suggesting right atrial pressure of 3 mmHg.  Comparison(s): Changes from prior study are noted. 04/11/2017: LVEF 60-65%, mild MR.  FINDINGS Left Ventricle: Left ventricular ejection fraction, by estimation, is 60 to 65%. Left ventricular ejection fraction by 3D volume is 62 %. The left ventricle has normal function. The left ventricle has no regional wall motion abnormalities. The average left ventricular global longitudinal strain is -22.2 %. Strain was performed and the global longitudinal strain is normal. The left ventricular internal cavity size was normal in size. There is no left ventricular hypertrophy. Left ventricular diastolic parameters are consistent with Grade I diastolic dysfunction (impaired relaxation). Elevated left ventricular end-diastolic pressure.  Right Ventricle: The right ventricular size is normal. No increase in right ventricular wall thickness. Right ventricular systolic function is mildly reduced. There is normal pulmonary artery systolic pressure. The tricuspid regurgitant velocity is 2.45 m/s, and with an assumed right atrial pressure of 3 mmHg, the estimated right ventricular systolic  pressure is 27.0 mmHg.  Left Atrium: Left atrial size was moderately dilated.  Right Atrium: Right atrial size was normal in size.  Pericardium: There is no evidence of pericardial effusion.  Mitral Valve: The mitral valve is degenerative in appearance. There is mild thickening of the posterior and anterior mitral valve leaflet(s). There is mild calcification of the anterior and posterior mitral valve leaflet(s). Moderate mitral valve regurgitation, with  centrally-directed jet.  Tricuspid Valve: The tricuspid valve is grossly normal. Tricuspid valve regurgitation is trivial.  Aortic Valve: The aortic valve is tricuspid. Aortic valve regurgitation is not visualized. Aortic valve sclerosis is present, with no evidence of aortic valve stenosis.  Pulmonic Valve: The pulmonic valve was normal in structure. Pulmonic valve regurgitation is not visualized.  Aorta: The aortic root and ascending aorta are structurally normal, with no evidence of dilitation.  Venous: The inferior vena cava is normal in size with greater than 50% respiratory variability, suggesting right atrial pressure of 3 mmHg.  IAS/Shunts: No atrial level shunt detected by color flow Doppler.  Additional Comments: 3D was performed not requiring image post processing on an independent workstation and was normal.   LEFT VENTRICLE PLAX 2D LVIDd:         4.60 cm         Diastology LVIDs:         2.50 cm         LV e' medial:    6.57 cm/s LV PW:         0.80 cm         LV E/e' medial:  19.9 LV IVS:        0.80 cm         LV e' lateral:   8.60 cm/s LVOT diam:     1.80 cm         LV E/e' lateral: 15.2 LV SV:         84 LV SV Index:   50              2D Longitudinal LVOT Area:     2.54 cm        Strain 2D Strain GLS   -25.8 % (A4C): 2D Strain GLS   -20.8 % (A3C): 2D Strain GLS   -20.0 % (A2C): 2D Strain GLS   -22.2 % Avg:  3D Volume EF LV 3D EF:    Left ventricul ar ejection fraction by 3D volume is 62 %.  3D Volume EF: 3D EF:        62 % LV EDV:       105 ml LV ESV:       39 ml LV SV:        65 ml  RIGHT VENTRICLE            IVC RV Basal diam:  3.30 cm    IVC diam: 1.80 cm RV Mid diam:    2.60 cm RV S prime:     9.65 cm/s TAPSE (M-mode): 2.7 cm  LEFT ATRIUM             Index        RIGHT ATRIUM           Index LA diam:        3.80 cm 2.25 cm/m   RA Area:     12.30 cm LA Vol (A2C):   81.7 ml 48.30 ml/m  RA Volume:   25.90 ml  15.31 ml/m LA Vol (A4C):  64.0 ml 37.83 ml/m LA Biplane Vol: 71.9 ml 42.50 ml/m AORTIC VALVE LVOT Vmax:   139.00 cm/s LVOT Vmean:  95.600 cm/s LVOT VTI:    0.332 m  AORTA Ao Root diam: 2.40 cm Ao Asc diam:  3.00 cm  MITRAL VALVE                  TRICUSPID VALVE MV Area (PHT): 2.73 cm       TR Peak grad:   24.0 mmHg MV Decel Time: 278 msec       TR Vmax:        245.00 cm/s MR Peak grad:    123.2 mmHg MR Mean grad:    82.0 mmHg    SHUNTS MR Vmax:         555.00 cm/s  Systemic VTI:  0.33 m MR Vmean:        430.0 cm/s   Systemic Diam: 1.80 cm MR PISA:         3.08 cm MR PISA Eff ROA: 18 mm MR PISA Radius:  0.70 cm MV E velocity: 131.00 cm/s MV A velocity: 132.00 cm/s MV E/A ratio:  0.99  Beth Maxcy MD Electronically signed by Beth Maxcy MD Signature Date/Time: 04/07/2024/2:12:32 PM    Final   TEE  ECHO TEE 07/20/2016  Narrative *[place]* *[facility]* [address] [place], [place] [address] [phone]  ------------------------------------------------------------------- Transesophageal Echocardiography  Patient:    Beth Hubbard, Beth Hubbard MR #:       [id number] Study Date: 07/20/2016 Gender:     F Age:        69 Height:     160 cm Weight:     71.7 kg BSA:        1.81 m^2 Pt. Status: Room:  ORDERING     Beth Bihari, MD REFERRING    Beth Bihari, MD ADMITTING    Beth Maxcy MD ATTENDING    Beth Maxcy MD PERFORMING   Beth Maxcy MD SONOGRAPHER  Thersia Lander, RDCS  cc:  ------------------------------------------------------------------- LV EF: 65% -   70%  ------------------------------------------------------------------- Indications:      MVD [non-rheumatic] 424.0.  ------------------------------------------------------------------- History:   PMH:   Transient ischemic attack.  Risk factors: Hypertension. Diabetes mellitus.  ------------------------------------------------------------------- Study Conclusions  - Left ventricle: There was  mild concentric hypertrophy. Systolic function was vigorous. The estimated ejection fraction was in the range of 65% to 70%. Wall motion was normal; there were no regional wall motion abnormalities. - Mitral valve: Mild tethering of the posterior leaflet. There is moderate eccentric regurgitation - 2 jets were present. The RVol was 35 cc with an EROA of 0.18 cm2 by PISA. Systolic flow reversal in the pulmonary vein was not noted and the mitral inflow is A wave dominant. - Left atrium: Moderately dilated. - Pulmonary veins: No anomaly. Systolic dominant pulmonary vein flow. - Right atrium: The atrium was dilated. - Atrial septum: No defect or patent foramen ovale was identified by saline microbubble contrast or color doppler. - Tricuspid valve: There was mild regurgitation.  Impressions:  - Findings consistent with moderate mitral regurgitation - 2 jets. There is moderate LAE. LVEF 65-70%. Negative for PFO. No LAA thrombus.  ------------------------------------------------------------------- Study data:   Study status:  Routine.  Consent:  The risks, benefits, and alternatives to the procedure were explained to the patient and informed consent was obtained.  Procedure:  Initial setup. The patient was brought to the laboratory. Surface ECG leads were monitored. Sedation. Deep  sedation was administered by anesthesiology staff. Sedation was reversed at the end of the procedure. Transesophageal echocardiography. An adult multiplane transesophageal probe was inserted by the attending cardiologistwithout difficulty. Image quality was adequate. Intravenous contrast (agitated saline) was administered.  Study completion:  The patient tolerated the procedure well. There were no complications.  Administered medications:   Propofol , 100mg , IV. Diagnostic transesophageal echocardiography.  2D and color Doppler.  Birthdate:  Patient birthdate: February 13, 1946.  Age:  Patient is 78 yr old.  Sex:   Gender: female.    BMI: 28 kg/m^2.  Blood pressure:     155/64  Patient status:  Outpatient.  Study date: Study date: 07/20/2016. Study time: 09:07 AM.  Location: Endoscopy.  -------------------------------------------------------------------  ------------------------------------------------------------------- Left ventricle:  There was mild concentric hypertrophy. Systolic function was vigorous. The estimated ejection fraction was in the range of 65% to 70%. Wall motion was normal; there were no regional wall motion abnormalities.  ------------------------------------------------------------------- Aortic valve:   Trileaflet.  Doppler:  There was no regurgitation.  ------------------------------------------------------------------- Aorta:  The aorta was normal, not dilated, and non-diseased.  ------------------------------------------------------------------- Mitral valve:  Mild tethering of the posterior leaflet. There is moderate eccentric regurgitation - 2 jets were present. The RVol was 35 cc with an EROA of 0.18 cm2 by PISA. Systolic flow reversal in the pulmonary vein was not noted and the mitral inflow is A wave dominant.  ------------------------------------------------------------------- Left atrium:  Moderately dilated.  ------------------------------------------------------------------- Atrial septum:  No defect or patent foramen ovale was identified by saline microbubble contrast or color doppler.  ------------------------------------------------------------------- Pulmonary veins:  No anomaly. Systolic dominant pulmonary vein flow.  ------------------------------------------------------------------- Right ventricle:  The cavity size was normal. Wall thickness was normal. Systolic function was normal.  ------------------------------------------------------------------- Pulmonic valve:    Doppler:  There was trivial  regurgitation.  ------------------------------------------------------------------- Tricuspid valve:   Doppler:  There was mild regurgitation.  ------------------------------------------------------------------- Pulmonary artery:   The main pulmonary artery was normal-sized.  ------------------------------------------------------------------- Right atrium:  The atrium was dilated.  ------------------------------------------------------------------- Pericardium:  There was no pericardial effusion.  ------------------------------------------------------------------- Post procedure conclusions Ascending Aorta:  - The aorta was normal, not dilated, and non-diseased.  ------------------------------------------------------------------- Measurements  Aorta                          Value Ascending aorta ID, A-P, S     26    mm  Mitral valve                   Value Mitral regurg VTI, PISA        196   cm Mitral ERO, PISA               0.18  cm^2 Mitral regurg volume, PISA     35    ml  Legend: (L)  and  (H)  Ilayda Toda values outside specified reference range.  ------------------------------------------------------------------- Prepared and Electronically Authenticated by  Beth Maxcy MD 2017-09-01T11:31:26    CT SCANS  CT CORONARY MORPH W/CTA COR W/SCORE 03/17/2024  Addendum 03/23/2024  7:29 PM ADDENDUM REPORT: 03/23/2024 19:26  EXAM: OVER-READ INTERPRETATION  CT CHEST  The following report is an over-read performed by radiologist Dr. Oneil Devonshire of [facility] on 03/23/2024. This over-read does not include interpretation of cardiac or coronary anatomy or pathology. The coronary calcium  score/coronary CTA interpretation by the cardiologist is attached.  COMPARISON:  06/09/2015  FINDINGS: Cardiovascular: Atherosclerotic calcifications of the thoracic aorta are noted. Pulmonary artery as  visualized is within normal limits.  Mediastinum/Nodes: There are no  enlarged lymph nodes within the visualized mediastinum.  Lungs/Pleura: There is no pleural effusion. The visualized lungs appear clear.  Upper abdomen: Sliding-type hiatal hernia is noted. Remainder of the upper abdomen is unremarkable.  Musculoskeletal/Chest wall: Degenerative changes of the thoracic spine are noted.  IMPRESSION: Aortic Atherosclerosis (ICD10-I70.0).   Electronically Signed By: Oneil Devonshire M.D. On: 03/23/2024 19:26  Narrative CLINICAL DATA:  Chest pain  EXAM: Cardiac/Coronary CTA  TECHNIQUE: A non-contrast, gated CT scan was obtained with axial slices of 2.5 mm through the heart for calcium  scoring. Calcium  scoring was performed using the Agatston method. A 120 kV prospective, gated, contrast cardiac CT scan was obtained. Gantry rotation speed was 230 msec and collimation was 0.63 mm. Two sublingual nitroglycerin  tablets (0.8 mg) were given. The 3D data set was reconstructed with motion correction for the best systolic or diastolic phase. Images were analyzed on a dedicated workstation using MPR, MIP, and VRT modes. The patient received 95 cc of contrast.  FINDINGS: Image quality: Good  Noise artifact is: Limited.  Coronary Arteries:  Normal coronary origin.  Right dominance.  Left main: The left main is a large caliber vessel with a normal take off from the left coronary cusp that bifurcates to form a left anterior descending artery and a left circumflex artery.  Left anterior descending artery: The LAD is patent. The LAD gives off 2 patent diagonal branches. Mixed plaque in proximal LAD causes 50-69% stenosis  Left circumflex artery: The LCX is non-dominant and patent. Calcified plaque in proximal LCX causes 0-24% stenosis  Right coronary artery: The RCA is dominant with normal take off from the right coronary cusp. The RCA terminates as a PDA and right posterolateral branch. Calcified plaque in proximal RCA causes 0-24% stenosis  Right  Atrium: Right atrial size is within normal limits.  Right Ventricle: The right ventricular cavity is within normal limits.  Left Atrium: Left atrial size is mildly enlarged with no left atrial appendage filling defect.  Left Ventricle: The ventricular cavity size is within normal limits.  Pulmonary arteries: Normal in size.  Pulmonary veins: Normal pulmonary venous drainage.  Pericardium: Normal thickness without significant effusion or calcium  present.  Cardiac valves: The aortic valve is trileaflet without significant calcification. The mitral valve is normal without significant calcification.  Aorta: Normal caliber without significant disease.  Extra-cardiac findings: See attached radiology report for non-cardiac structures.  IMPRESSION: 1. Coronary calcium  score of 164. This was 73rd percentile for age-, sex, and race-matched controls.  2. Total plaque volume 241mm3 which is 35th percentile for age- and sex-matched controls (calcified plaque 13mm3; non-calcified plaque 155mm3). TPV is moderate  3.  Normal coronary origin with right dominance.  4.  Moderate (50-69%) stenosis in proximal LAD  5.  Minimal (0-24%) stenosis in proximal LCX and proximal RCA  6.  Will send study for CTFFR  RECOMMENDATIONS: CAD-RADS 3: Moderate stenosis. Consider symptom-guided anti-ischemic pharmacotherapy as well as risk factor modification per guideline directed care. Additional analysis with CT FFR will be submitted.  Electronically Signed: By: Beth Hubbard M.D. On: 03/17/2024 12:45     ______________________________________________________________________________________________       Laboratory Data: High Sensitivity Troponin:   Recent Labs  Lab 05/14/24 0311 05/14/24 0518  TROPONINIHS 8 7     Chemistry Recent Labs  Lab 05/14/24 0058  NA 140  K 3.6  CL 103  CO2 27  GLUCOSE 102*  BUN 17  CREATININE  0.95  CALCIUM  9.3  GFRNONAA >60  ANIONGAP 10     Recent Labs  Lab 05/14/24 0058  PROT 7.0  ALBUMIN 3.6  AST 33  ALT 35  ALKPHOS 45  BILITOT 0.4   Lipids No results for input(s): CHOL, TRIG, HDL, LABVLDL, LDLCALC, CHOLHDL in the last 168 hours.  Hematology Recent Labs  Lab 05/14/24 0058  WBC 6.0  RBC 3.49*  HGB 11.0*  HCT 34.0*  MCV 97.4  MCH 31.5  MCHC 32.4  RDW 13.6  PLT 188   Thyroid No results for input(s): TSH, FREET4 in the last 168 hours.  BNP Recent Labs  Lab 05/14/24 0311  BNP 72.0    DDimer No results for input(s): DDIMER in the last 168 hours.  Radiology/Studies:  DG Chest Portable 1 View Result Date: 05/14/2024 CLINICAL DATA:  Shortness of breath EXAM: PORTABLE CHEST 1 VIEW COMPARISON:  04/21/2021 FINDINGS: Cardiac shadow is mildly prominent but accentuated by the frontal technique. Lungs are well aerated bilaterally. Mild vascular congestion is seen with patchy airspace opacity which may represent early edema. No sizable effusion is seen. No bony abnormality is noted. IMPRESSION: Findings consistent with mild CHF. Electronically Signed   By: Oneil Devonshire M.D.   On: 05/14/2024 01:53     Assessment and Plan:  Shortness of breath  Acute on chronic diastolic heart failure  Moderate MR - Most recent echocardiogram from 03/2024 showed EF 60-65%, no regional wall motion abnormalities, elevated LVEDP, mildly reduced RV systolic function, normal PA systolic pressure, moderate MR - Does not appear that patient has been on diuretic therapy at home - Presented to the ED very early this a.m. with shortness of breath on exertion and orthopnea.  Chest x-ray showed findings consistent with mild CHF.  BNP within normal limits - Patient received 1 dose of IV Lasix 20 mg in the ED.  Reports good urine output.  Was able to be weaned off supplemental oxygen at rest but continued to have some shortness of breath with exertion - Patient euvolemic on exam but does not feel like her breathing has returned to  normal.  Give additional dose IV Lasix 20 mg now - At discharge will need to be on po lasix 20 mg daily   Chest pain  CAD  - Patient previously had cath in 03/2024 that showed focal 75% stenosis in the proximal LAD, otherwise no angiographically significant coronary artery disease. Recommended aggressive antianginal therapy. There was some concern that patient would not be able to tolerate ASA (in the past had tinitis/tremors). 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 Pain is not exertional. Worsens if she moves her shoulders. Reproducible on palpation  - Currently, her chest pain is atypical and consistent with a musculoskeletal cause. However, she did have some chest pain in May after her cath. Pain seemed to improve with imdur . We are considering a repeat cath if she continues to have chest pain despite anti-anginal therapy. Arra - Continue ASA, plavix  until end of August, at which time stop aspirin  and continue plavix  monotherapy  - Continue crestor  40 mg daily   Otherwise per primary  - Normocytic anemia - Type 2  DM  - HLD  - HTN    Risk Assessment/Risk Scores:   For questions or updates, please contact [facility] Please consult [email or web address] for contact info under    Signed, Rollo FABIENE Louder, PA-C  05/14/2024 11:39 AM  Personally seen and examined. Agree with above.  78 year old with proximal 75% LAD lesion that was borderline flow-limiting at 0.78 FFR on catheterization with moderate mitral regurgitation present for several years here with shortness of breath acute on chronic diastolic heart failure (normal  ejection fraction) and atypical chest discomfort.  Sitting up eating her lunch feeling better.  Desires to go home.  On exam soft systolic murmur heard at apex, regular rate and rhythm, no significant edema.  When pushing on her mid chest wall she winces.  Lab work demonstrates normal troponin, cytochrome P450 2C19 shows normal response to Plavix .  Plan -We are going to administer another dose of IV Lasix -Would encourage 20 mg of Lasix daily at home. -Low-salt diet -With regards to her coronary disease, she is able to tolerate dual antiplatelet therapy and has good response to Plavix .  Her chest pain currently seems to be quite atypical and musculoskeletal in nature however if she does starts to develop more exertional component, 1 could always consider intervention to the proximal LAD lesion.  For now continue with good goal-directed medical therapy diabetes treatment. -Antianginals of amlodipine  10 mg a day isosorbide  30 mg a day and metoprolol  succinate 25 mg a day are being utilized. -Continue with high intensity statin Crestor  40 mg a day.  I am not opposed to her potentially going home as she desires. We will set up close clinic follow-up.  Oneil Parchment, MD

## 2024-05-14 NOTE — Discharge Summary (Signed)
 Physician Discharge Summary   Patient: Beth Hubbard: [id number] DOB: 08-Jul-1946  Admit date:     05/14/2024  Discharge date: 05/14/24  Discharge Physician: Maximino DELENA Sharps   PCP: Oley Bascom RAMAN, NP   Recommendations at discharge:   Patient left AGAINST MEDICAL ADVICE, but recommended to follow-up in the outpatient setting with cardiology and prescription sent for Lasix 20 mg daily  Discharge Diagnoses: Principal Problem:   Acute on chronic diastolic CHF (congestive heart failure) (HCC) Active Problems:   Acute respiratory failure with hypoxia (HCC)   CAD (coronary artery disease)   Hypertension   Normocytic anemia   High cholesterol   Diabetes mellitus without complication (HCC)  Resolved Problems:   * No resolved hospital problems. *  Hospital Course: Beth Hubbard is a 78 y.o. female with medical history significant of hypertension, hyperlipidemia, CAD, moderate mitral regurgitation, TIA, diabetes mellitus type 2, and OSA  presents with shortness of breath and chest discomfort.    She experienced shortness of breath and chest discomfort last night between 10 PM and 12 AM. The discomfort initially started under her left breast and later moved to the center of her chest. The symptoms were severe enough to prompt her to call 911. This was the first occurrence of such symptoms, which happened while she was lying down. She attempted to alleviate the symptoms by standing and walking around, but the shortness of breath returned whenever she tried to lay down again.     Last month, she underwent a cardiac catheterization on 5/12 with Dr. Mady which showed 75% blockage to the proximal LAD.  Records note metoprolol  25 mg daily and 81 mg aspirin  were added to her medication regimen.  Previously aspirin  had been stopped due to tinnitus/tremor.  Labs were  sent off cytochrome P4 52C 19 genotype to ensure patient is appropriate Plavix  responder.  Patient reports that she was not taking  aspirin  as she was told to discontinue it, but later on this was thought to possibly be a mixup as daughter reports that patient was taking aspirin  and Plavix .   No history of smoking or alcohol use. She notes swelling in her feet. No fevers or chills prior to her hospital visit. She did not take any medication like nitroglycerin  during the episode of shortness of breath.   En route with EMS patient was noted to have O2 saturations of 86% on room air and placed on 4 L of nasal cannula oxygen.   In the emergency department patient was noted to be afebrile with tachypnea and O2 saturations noted to be as low as 86% room air while walking to the bathroom with improvement on nasal cannula oxygen.  Labs noted hemoglobin 11, BNP 72, and high-sensitivity troponin negative x 2.  Chest x-ray noted findings consistent with mild CHF.  Patient had been given Lasix 20 mg IV x 1 dose.  TR[facility] called to admit.  Assessment and Plan: Acute respiratory failure with hypoxia secondary to Heart failure with preserved ejection fraction Patient presents with complaints of shortness of breath and orthopnea.  BNP was noted to be within normal limits.  Chest x-ray noted mild congestive heart failure.  Last echocardiogram noted EF to be 60 to 65% with grade 1 diastolic dysfunction when checked on 04/07/2024.  Patient had been given Lasix 20 mg IV x 2 doses while in the ED.  Cardiology had evaluated and recommended continuing Lasix 20 mg p.o. at discharge.  Patient left AGAINST MEDICAL ADVICE.  Called patient's daughter in regards to prescription for Lasix 20 mg p.o. to take daily per cardiology's recommendations after seeing her in the hospital.  Daughter informed me that patient has a follow-up appointment scheduled with cardiology next month and would go pick up Lasix prescription for her mother.  CAD Patient reports having chest pain underneath her left breast which moved to the center of her chest.  High-sensitivity troponins  were negative x 2.  Just underwent cardiac catheterization on 03/30/2024 which showed 75% proximal LAD stenosis.  There had been some initial confusion on whether the patient was taking aspirin , but daughter reports patient is taking aspirin  and Plavix  and we will ensure that she takes this medication. - Continue aspirin , Plavix , isosorbide  mononitrate, beta-blocker, and statin   Essential hypertension Blood pressures noted to be elevated up to 153/68. - Continue amlodipine , metoprolol , and pharmacy substitution for olmesartan   Normocytic anemia Hemoglobin 11 which appears around patient baseline.  Hyperlipidemia Last lipid panel noted total cholesterol 174, HDL 53, LDL 98, triglycerides 128 when checked on 12/09/2023. - Continue Crestor    Controlled diabetes mellitus type 2, without long-term use of insulin  On admission glucose 102.  Last hemoglobin A1c noted to be 5.8.  Patient is on Januvia . - Continue Januvia        Consultants: Cardiology Procedures performed:  Disposition: Home Diet recommendation:   DISCHARGE MEDICATION: Allergies as of 05/14/2024       Reactions   Aspirin     Tremor   Aspirin  Other (See Comments)   Ringing in Ear   Ibuprofen Other (See Comments)   Ringing in Ear   Penicillins Itching, Other (See Comments)   Reaction: itching,headache, dizziness and anxiety. Feels like she is goin off Did it involve swelling of the face/tongue/throat, SOB, or low BP? No Did it involve sudden or severe rash/hives, skin peeling, or any reaction on the inside of your mouth or nose? No Did you need to seek medical attention at a hospital or doctor's office? No When did it last happen?      Childhood If all above answers are "NO", may proceed with cephalosporin use.   Penicillins Other (See Comments)   Patient fainted   Nsaids Anxiety   Reaction: causes dizziness and headache. Ibuprofen. Aspirin         Medication List     TAKE these medications     acetaminophen  500 MG tablet Commonly known as: TYLENOL  Take 500 mg by mouth every 6 (six) hours as needed for mild pain (pain score 1-3) or moderate pain (pain score 4-6).   amLODipine  10 MG tablet Commonly known as: NORVASC  TAKE 1 TABLET(10 MG) BY MOUTH DAILY   aspirin  EC 81 MG tablet Take 1 tablet (81 mg total) by mouth daily. Swallow whole.   clopidogrel  75 MG tablet Commonly known as: PLAVIX  Take 1 tablet (75 mg total) by mouth daily.   furosemide 20 MG tablet Commonly known as: Lasix Take 1 tablet (20 mg total) by mouth daily.   gabapentin  300 MG capsule Commonly known as: NEURONTIN  Take 1 capsule (300 mg total) by mouth at bedtime.   isosorbide  mononitrate 30 MG 24 hr tablet Commonly known as: IMDUR  Take 1 tablet (30 mg total) by mouth daily.   metoprolol  succinate 25 MG 24 hr tablet Commonly known as: Toprol  XL Take 1 tablet (25 mg total) by mouth daily.   rosuvastatin  40 MG tablet Commonly known as: CRESTOR  Take 1 tablet (40 mg total) by mouth daily. What changed: when to  take this   sitaGLIPtin  50 MG tablet Commonly known as: Januvia  TAKE 1 TABLET(50 MG) BY MOUTH DAILY   SLEEP AID PO Take 25 mg by mouth at bedtime.   traZODone  100 MG tablet Commonly known as: DESYREL  TAKE 1 TABLET(100 MG) BY MOUTH AT BEDTIME   valsartan  160 MG tablet Commonly known as: DIOVAN  Take 1 tablet (160 mg total) by mouth at bedtime.        Discharge Exam: Filed Weights   05/14/24 0056  Weight: 68 kg   Patient left AGAINST MEDICAL ADVICE  Condition at discharge:   The results of significant diagnostics from this hospitalization (including imaging, microbiology, ancillary and laboratory) are listed below for reference.   Imaging Studies: DG Chest Portable 1 View Result Date: 05/14/2024 CLINICAL DATA:  Shortness of breath EXAM: PORTABLE CHEST 1 VIEW COMPARISON:  04/21/2021 FINDINGS: Cardiac shadow is mildly prominent but accentuated by the frontal technique. Lungs  are well aerated bilaterally. Mild vascular congestion is seen with patchy airspace opacity which may represent early edema. No sizable effusion is seen. No bony abnormality is noted. IMPRESSION: Findings consistent with mild CHF. Electronically Signed   By: Oneil Devonshire M.D.   On: 05/14/2024 01:53    Microbiology: Results for orders placed or performed in visit on 03/13/24  Urine Culture     Status: None   Collection Time: 03/13/24 10:55 AM   Specimen: Urine   UR  Result Value Ref Range Status   Urine Culture, Routine Final report  Final   Organism ID, Bacteria Comment  Final    Comment: Culture shows less than 10,000 colony forming units of bacteria per milliliter of urine. This colony count is not generally considered to be clinically significant.     Labs: CBC: Recent Labs  Lab 05/14/24 0058  WBC 6.0  NEUTROABS 2.3  HGB 11.0*  HCT 34.0*  MCV 97.4  PLT 188   Basic Metabolic Panel: Recent Labs  Lab 05/14/24 0058  NA 140  K 3.6  CL 103  CO2 27  GLUCOSE 102*  BUN 17  CREATININE 0.95  CALCIUM  9.3   Liver Function Tests: Recent Labs  Lab 05/14/24 0058  AST 33  ALT 35  ALKPHOS 45  BILITOT 0.4  PROT 7.0  ALBUMIN 3.6   CBG: No results for input(s): GLUCAP in the last 168 hours.  Discharge time spent: greater than 30 minutes.  Signed: Maximino DELENA Sharps, MD [facility] 05/14/2024

## 2024-05-14 NOTE — ED Notes (Signed)
 Pt left AMA at 1355

## 2024-05-14 NOTE — Telephone Encounter (Signed)
 Called back. Pts daughter stated Hospital had called her and let her know what was happening and sent in lasix for her mother. She was on the way to get the medication.

## 2024-05-14 NOTE — ED Notes (Signed)
 Called CCMD to verify monitoring

## 2024-05-14 NOTE — ED Triage Notes (Signed)
 Patient BIB [facility] for [facility] since yesterday morning, states SHOB increases when lying down. Patient O2 saturation 86% on RA, placed on 4 LPM en route. Patient states hx of blockage in heart and diabetes denies hx of COPD or CHF.

## 2024-05-26 ENCOUNTER — Other Ambulatory Visit: Payer: Self-pay | Admitting: Nurse Practitioner

## 2024-05-26 DIAGNOSIS — E1129 Type 2 diabetes mellitus with other diabetic kidney complication: Secondary | ICD-10-CM

## 2024-05-30 NOTE — Progress Notes (Unsigned)
 Cardiology Office Note:    Date:  06/02/2024   ID:  Beth, Hubbard May 10, 1946, MRN [id number]  PCP:  Oley Bascom RAMAN, NP   [facility] Providers Cardiologist:  Wilbert Bihari, MD Cardiology APP:  Madie Jon Garre, PA     Referring MD: Oley Bascom RAMAN, NP   Chief Complaint  Patient presents with   Coronary Artery Disease   Hypertension   Hyperlipidemia   Congestive Heart Failure   Mitral Regurgitation    History of Present Illness:    Beth Hubbard is a 78 y.o. female with a hx of CAD, hypertension, hyperlipidemia, DM2, OSA, and TIA.  Coronary CTA showed moderate disease in the proximal LAD borderline significant by CT FFR.  She presented to the hospital 03/30/2024 with chest pain.  LHC 03/30/2024 showed severe single-vessel disease with 75% stenosis in proximal LAD, no PCI.  When I saw her in follow up 04/2024 imdur  had reduced her chest pain, she remained hypertensive.   Discussed with Dr. Mady: if recurrence of CP on plavix  monotherapy, go back to DAPT and may need to consider PCI at that point.   She was seen in the ER 05/14/24 with SOB, troponin and BNP negative. She was treated with 20 mg IV lasix  OTO and discharged.   She presents for follow up. She felt a 3-sec sharp chest pain last night when she got into bed, sat up and it resolved quickly. No further chest pain.   Dry weight felt 149 lbs. Euvolemic today.   She feels well since hospitalization. We discuss HFpEF.   Past Medical History:  Diagnosis Date   Arthritis    Diabetes mellitus without complication (HCC)    Excessive daytime sleepiness    Glaucoma    High cholesterol    Hyperlipemia    Hypertension    Mitral regurgitation    Moderate by echo 03/2024   OSA (obstructive sleep apnea) 10/18/2015   Mild OSA with AHI 12.5/hr on CPAP at 8cm H2O   Pneumonia    Shingles    TIA (transient ischemic attack)     Past Surgical History:  Procedure Laterality Date   ABDOMINAL SURGERY     BICEPT  TENODESIS Left 01/11/2023   Procedure: BICEPS TENODESIS;  Surgeon: Yvone Rush, MD;  Location: [facility];  Service: Orthopedics;  Laterality: Left;   CANTHOPLASTY Right 03/30/2022   Procedure: CANTHOPLASTY;  Surgeon: Rodgers Faden, MD;  Location: [facility] OR;  Service: Ophthalmology;  Laterality: Right;   EYE EXAMINATION UNDER ANESTHESIA Right 03/30/2022   Procedure: EYE EXAM UNDER ANESTHESIA;  Surgeon: Rodgers Faden, MD;  Location: [facility] OR;  Service: Ophthalmology;  Laterality: Right;   EYE SURGERY Bilateral    cataract surgery   LEFT HEART CATH AND CORONARY ANGIOGRAPHY N/A 03/30/2024   Procedure: LEFT HEART CATH AND CORONARY ANGIOGRAPHY;  Surgeon: Mady Bruckner, MD;  Location: [facility];  Service: Cardiovascular;  Laterality: N/A;   ORIF ORBITAL FRACTURE Right 03/30/2022   Procedure: ORBITAL FLOOR FRACTURE REPAIR WITH IMPLANT;  Surgeon: Rodgers Faden, MD;  Location: [facility] OR;  Service: Ophthalmology;  Laterality: Right;   ROTATOR CUFF REPAIR     bilateral   SHOULDER ARTHROSCOPY WITH OPEN ROTATOR CUFF REPAIR AND DISTAL CLAVICLE ACROMINECTOMY Left 01/11/2023   Procedure: ARTHROSCOPY SHOULDER MINI OPEN ROTATOR CUFF REPAIR, SUBACROMIAL DECOMPRESSION AND DISTAL CLAVICLE RESECTION;  Surgeon: Yvone Rush, MD;  Location: [facility];  Service: Orthopedics;  Laterality: Left;   TEE WITHOUT CARDIOVERSION N/A  07/20/2016   Procedure: TRANSESOPHAGEAL ECHOCARDIOGRAM (TEE);  Surgeon: Vinie JAYSON Maxcy, MD;  Location: [facility] ENDOSCOPY;  Service: Cardiovascular;  Laterality: N/A;   TOOTH EXTRACTION Left 10/09/2019   Procedure: DENTAL EXTRACTION X1 and Irrigation and Debridement.;  Surgeon: Sheryle Hamilton, DDS;  Location: [facility] OR;  Service: Oral Surgery;  Laterality: Left;  DENTAL EXTRACTION X1 and Irrigation and Debridement.    Current Medications: Current Meds  Medication Sig   acetaminophen  (TYLENOL ) 500 MG tablet Take 500 mg by mouth every 6 (six) hours as needed for mild pain  (pain score 1-3) or moderate pain (pain score 4-6).   amLODipine  (NORVASC ) 10 MG tablet TAKE 1 TABLET(10 MG) BY MOUTH DAILY   aspirin  EC 81 MG tablet Take 1 tablet (81 mg total) by mouth daily. Swallow whole.   clopidogrel  (PLAVIX ) 75 MG tablet Take 1 tablet (75 mg total) by mouth daily.   Doxylamine  Succinate, Sleep, (SLEEP AID PO) Take 25 mg by mouth at bedtime.   gabapentin  (NEURONTIN ) 300 MG capsule Take 1 capsule (300 mg total) by mouth at bedtime.   isosorbide  mononitrate (IMDUR ) 30 MG 24 hr tablet Take 1 tablet (30 mg total) by mouth daily.   metoprolol  succinate (TOPROL  XL) 25 MG 24 hr tablet Take 1 tablet (25 mg total) by mouth daily.   rosuvastatin  (CRESTOR ) 40 MG tablet Take 1 tablet (40 mg total) by mouth daily.   sitaGLIPtin  (JANUVIA ) 50 MG tablet TAKE 1 TABLET(50 MG) BY MOUTH DAILY   traZODone  (DESYREL ) 100 MG tablet TAKE 1 TABLET(100 MG) BY MOUTH AT BEDTIME   valsartan  (DIOVAN ) 160 MG tablet Take 1 tablet (160 mg total) by mouth at bedtime.   [DISCONTINUED] furosemide  (LASIX ) 20 MG tablet Take 1 tablet (20 mg total) by mouth daily.     Allergies:   Aspirin , Aspirin , Ibuprofen, Penicillins, Penicillins, and Nsaids   Social History   Socioeconomic History   Marital status: Legally Separated    Spouse name: Not on file   Number of children: Not on file   Years of education: Not on file   Highest education level: Not on file  Occupational History   Not on file  Tobacco Use   Smoking status: Never   Smokeless tobacco: Never  Vaping Use   Vaping status: Never Used  Substance and Sexual Activity   Alcohol use: Never   Drug use: Never   Sexual activity: Not Currently  Other Topics Concern   Not on file  Social History Narrative   ** Merged History Encounter **       Social Drivers of Health   Financial Resource Strain: Low Risk  (01/30/2024)   Overall Financial Resource Strain (CARDIA)    Difficulty of Paying Living Expenses: Not hard at all  Food Insecurity: No  Food Insecurity (01/30/2024)   Hunger Vital Sign    Worried About Running Out of Food in the Last Year: Never true    Ran Out of Food in the Last Year: Never true  Transportation Needs: No Transportation Needs (01/30/2024)   PRAPARE - Administrator, Civil Service (Medical): No    Lack of Transportation (Non-Medical): No  Physical Activity: Sufficiently Active (01/30/2024)   Exercise Vital Sign    Days of Exercise per Week: 7 days    Minutes of Exercise per Session: 30 min  Recent Concern: Physical Activity - Insufficiently Active (12/13/2023)   Exercise Vital Sign    Days of Exercise per Week: 2 days    Minutes of  Exercise per Session: 20 min  Stress: No Stress Concern Present (01/30/2024)   [employer] of Occupational Health - Occupational Stress Questionnaire    Feeling of Stress : Not at all  Social Connections: Moderately Integrated (01/30/2024)   Social Connection and Isolation Panel    Frequency of Communication with Friends and Family: More than three times a week    Frequency of Social Gatherings with Friends and Family: More than three times a week    Attends Religious Services: More than 4 times per year    Active Member of [employer] or Organizations: Yes    Attends Engineer, structural: More than 4 times per year    Marital Status: Separated     Family History: The patient's family history includes Cancer in her brother, brother, father, sister, and sister. There is no history of Breast cancer.  ROS:   Please see the history of present illness.     All other systems reviewed and are negative.  EKGs/Labs/Other Studies Reviewed:    The following studies were reviewed today:       Recent Labs: 05/14/2024: ALT 35; B Natriuretic Peptide 72.0; BUN 17; Creatinine, Ser 0.95; Hemoglobin 11.0; Platelets 188; Potassium 3.6; Sodium 140  Recent Lipid Panel    Component Value Date/Time   CHOL 174 12/13/2023 1043   TRIG 128 12/13/2023 1043   HDL 53  12/13/2023 1043   CHOLHDL 3.3 12/13/2023 1043   CHOLHDL 2.7 04/08/2017 0909   VLDL 27 04/08/2017 0909   LDLCALC 98 12/13/2023 1043     Risk Assessment/Calculations:        Physical Exam:    VS:  BP (!) 140/70   Pulse 64   Ht 5' 4 (1.626 m)   Wt 150 lb 9.6 oz (68.3 kg)   SpO2 95%   BMI 25.85 kg/m     Wt Readings from Last 3 Encounters:  06/02/24 150 lb 9.6 oz (68.3 kg)  05/14/24 149 lb 14.6 oz (68 kg)  05/05/24 150 lb (68 kg)     GEN:  Well nourished, well developed in no acute distress HEENT: Normal NECK: No JVD; No carotid bruits LYMPHATICS: No lymphadenopathy CARDIAC: RRR, no murmurs, rubs, gallops RESPIRATORY:  Clear to auscultation without rales, wheezing or rhonchi  ABDOMEN: Soft, non-tender, non-distended MUSCULOSKELETAL:  No edema; No deformity  SKIN: Warm and dry NEUROLOGIC:  Alert and oriented x 3 PSYCHIATRIC:  Normal affect   ASSESSMENT:    1. Nonrheumatic mitral valve regurgitation   2. Chronic heart failure with preserved ejection fraction (HFpEF) (HCC)   3. Chronic diastolic heart failure (HCC)   4. Coronary artery disease due to lipid rich plaque   5. Primary hypertension   6. Hyperlipidemia with target LDL less than 70    PLAN:    In order of problems listed above:  CAD - managed medically - continue ASA, plavix  - cytochrome P450 2C19 genotype is consistent with ultrarapid activity suggesting strong platelet metabolic activity - continue plavix  as antiplatelet monotherapy - continue ASA and plavix  x 3 months then plavix  monotherapy - no further chest pain with imdur    Hypertension - 10 mg amlodipine , 30 mg imdur , 25 mg toprol , 80 mg valsartan  - BP acceptable    Hyperlipidemia with LDL goal < 70 12/13/2023: Cholesterol, Total 174; HDL 53; LDL Chol Calc (NIH) 98; Triglycerides 128 - 40 mg crestor    Chronic diastolic heart failure Moderate MR - recent admission for CHF, treated with 20 mg IV lasix  with good  response - continue 20  mg lasix  daily with PRN extra 20 mg for edema, SOB, or 3 lb weight gain   Due for labs in 10 days with PCP - please draw BMP at that time. Low normal K, may need 10 mEq potassium along with lasix .   Follow up in 2 months.              Medication Adjustments/Labs and Tests Ordered: Current medicines are reviewed at length with the patient today.  Concerns regarding medicines are outlined above.  Orders Placed This Encounter  Procedures   Basic metabolic panel with GFR   Meds ordered this encounter  Medications   furosemide  (LASIX ) 20 MG tablet    Sig: Take 1 tablet (20 mg total) by mouth in the morning, THEN 1 tablet (20 mg total) as needed for edema (shortness of breath, or 3 pound weight gain over night).    Dispense:  120 tablet    Refill:  3    Patient Instructions  Medication Instructions:  Lasix  20 mg every morning. Take an extra 20 mg as needed for shortness of breath, edema or a 3 pound weight gain overnight  *If you need a refill on your cardiac medications before your next appointment, please call your pharmacy*  Lab Work: BMP with PCP in 10 days with other labs  If you have labs (blood work) drawn today and your tests are completely normal, you will receive your results only by: MyChart Message (if you have MyChart) OR A paper copy in the mail If you have any lab test that is abnormal or we need to change your treatment, we will call you to review the results.  Testing/Procedures: NONE  Follow-Up: At [facility], you and your health needs are our priority.  As part of our continuing mission to provide you with exceptional heart care, our providers are all part of one team.  This team includes your primary Cardiologist (physician) and Advanced Practice Providers or APPs (Physician Assistants and Nurse Practitioners) who all work together to provide you with the care you need, when you need it.  Your next appointment:   August 07, 2024 at 8:00  AM  Provider:   Wilbert Bihari, MD    We recommend signing up for the patient portal called MyChart.  Sign up information is provided on this After Visit Summary.  MyChart is used to connect with patients for Virtual Visits (Telemedicine).  Patients are able to view lab/test results, encounter notes, upcoming appointments, etc.  Non-urgent messages can be sent to your provider as well.   To learn more about what you can do with MyChart, go to [email or web address].      Signed, Jon Nat Hails, [place]  06/02/2024 4:53 PM    [facility]

## 2024-06-02 ENCOUNTER — Encounter: Payer: Self-pay | Admitting: Physician Assistant

## 2024-06-02 ENCOUNTER — Ambulatory Visit: Attending: Cardiology | Admitting: Physician Assistant

## 2024-06-02 VITALS — BP 140/70 | HR 64 | Ht 64.0 in | Wt 150.6 lb

## 2024-06-02 DIAGNOSIS — I2510 Atherosclerotic heart disease of native coronary artery without angina pectoris: Secondary | ICD-10-CM | POA: Diagnosis not present

## 2024-06-02 DIAGNOSIS — I5032 Chronic diastolic (congestive) heart failure: Secondary | ICD-10-CM

## 2024-06-02 DIAGNOSIS — I10 Essential (primary) hypertension: Secondary | ICD-10-CM | POA: Diagnosis not present

## 2024-06-02 DIAGNOSIS — I2583 Coronary atherosclerosis due to lipid rich plaque: Secondary | ICD-10-CM

## 2024-06-02 DIAGNOSIS — E785 Hyperlipidemia, unspecified: Secondary | ICD-10-CM

## 2024-06-02 DIAGNOSIS — I340 Nonrheumatic mitral (valve) insufficiency: Secondary | ICD-10-CM | POA: Diagnosis not present

## 2024-06-02 MED ORDER — FUROSEMIDE 20 MG PO TABS: ORAL_TABLET | ORAL | 3 refills | Status: AC

## 2024-06-02 NOTE — Patient Instructions (Signed)
 Medication Instructions:  Lasix  20 mg every morning. Take an extra 20 mg as needed for shortness of breath, edema or a 3 pound weight gain overnight  *If you need a refill on your cardiac medications before your next appointment, please call your pharmacy*  Lab Work: BMP with PCP in 10 days with other labs  If you have labs (blood work) drawn today and your tests are completely normal, you will receive your results only by: MyChart Message (if you have MyChart) OR A paper copy in the mail If you have any lab test that is abnormal or we need to change your treatment, we will call you to review the results.  Testing/Procedures: NONE  Follow-Up: At [facility], you and your health needs are our priority.  As part of our continuing mission to provide you with exceptional heart care, our providers are all part of one team.  This team includes your primary Cardiologist (physician) and Advanced Practice Providers or APPs (Physician Assistants and Nurse Practitioners) who all work together to provide you with the care you need, when you need it.  Your next appointment:   August 07, 2024 at 8:00 AM  Provider:   Wilbert Bihari, MD    We recommend signing up for the patient portal called MyChart.  Sign up information is provided on this After Visit Summary.  MyChart is used to connect with patients for Virtual Visits (Telemedicine).  Patients are able to view lab/test results, encounter notes, upcoming appointments, etc.  Non-urgent messages can be sent to your provider as well.   To learn more about what you can do with MyChart, go to [email or web address].

## 2024-06-12 ENCOUNTER — Encounter: Payer: Self-pay | Admitting: Nurse Practitioner

## 2024-06-12 ENCOUNTER — Ambulatory Visit (INDEPENDENT_AMBULATORY_CARE_PROVIDER_SITE_OTHER): Admitting: Nurse Practitioner

## 2024-06-12 VITALS — BP 137/46 | HR 58 | Temp 98.2°F | Wt 147.8 lb

## 2024-06-12 DIAGNOSIS — E119 Type 2 diabetes mellitus without complications: Secondary | ICD-10-CM | POA: Diagnosis not present

## 2024-06-12 DIAGNOSIS — R809 Proteinuria, unspecified: Secondary | ICD-10-CM

## 2024-06-12 DIAGNOSIS — E1129 Type 2 diabetes mellitus with other diabetic kidney complication: Secondary | ICD-10-CM | POA: Diagnosis not present

## 2024-06-12 DIAGNOSIS — I509 Heart failure, unspecified: Secondary | ICD-10-CM

## 2024-06-12 LAB — POCT GLYCOSYLATED HEMOGLOBIN (HGB A1C): Hemoglobin A1C: 5.9 % — AB (ref 4.0–5.6)

## 2024-06-12 MED ORDER — SITAGLIPTIN PHOSPHATE 50 MG PO TABS: ORAL_TABLET | ORAL | 0 refills | Status: DC

## 2024-06-12 NOTE — Progress Notes (Signed)
 Subjective   Patient ID: Beth Hubbard, female    DOB: 06-28-1946, 78 y.o.   MRN: [id number]  Chief Complaint  Patient presents with   Diabetes    Referring provider: Oley Bascom RAMAN, NP  Earnie SHAUNNA Shu is a 78 y.o. female with Past Medical History: No date: Arthritis No date: Diabetes mellitus without complication (HCC) No date: Excessive daytime sleepiness No date: Glaucoma No date: High cholesterol No date: Hyperlipemia No date: Hypertension No date: Mitral regurgitation     Comment:  Moderate by echo 03/2024 10/18/2015: OSA (obstructive sleep apnea)     Comment:  Mild OSA with AHI 12.5/hr on CPAP at 8cm H2O No date: Pneumonia No date: Shingles No date: TIA (transient ischemic attack)   HPI  Beth Hubbard is a 78 y.o. female who presents for follow-up of Type 2 diabetes mellitus.   Patient is not checking home blood sugars.   Home blood sugar records:  How often is blood sugars being checked: not checking Current symptoms/problems include none and have been stable. Last eye exam: within the past year Exercise: The patient does not participate in regular exercise at present.   A1C today is 5.9   Has CHF. Has followed with cardiology.     Denies f/c/s, n/v/d, hemoptysis, PND, leg swelling Denies chest pain or edema    Allergies  Allergen Reactions   Aspirin      Tremor    Aspirin  Other (See Comments)    Ringing in Ear   Ibuprofen Other (See Comments)    Ringing in Ear   Penicillins Itching and Other (See Comments)    Reaction: itching,headache, dizziness and anxiety. Feels like she is goin off Did it involve swelling of the face/tongue/throat, SOB, or low BP? No Did it involve sudden or severe rash/hives, skin peeling, or any reaction on the inside of your mouth or nose? No Did you need to seek medical attention at a hospital or doctor's office? No When did it last happen?      Childhood If all above answers are "NO", may proceed with  cephalosporin use.   Penicillins Other (See Comments)    Patient fainted   Nsaids Anxiety    Reaction: causes dizziness and headache. Ibuprofen. Aspirin     Immunization History  Administered Date(s) Administered   Influenza, High Dose Seasonal PF 09/23/2018   Influenza,inj,Quad PF,6+ Mos 09/23/2020   Influenza-Unspecified 06/19/2013   PFIZER(Purple Top)SARS-COV-2 Vaccination 04/28/2020, 05/27/2020   Pneumococcal Conjugate-13 02/24/2015   Pneumococcal Polysaccharide-23 03/05/2016   Tdap 02/24/2015    Tobacco History: Social History   Tobacco Use  Smoking Status Never  Smokeless Tobacco Never   Counseling given: Not Answered   Outpatient Encounter Medications as of 06/12/2024  Medication Sig   acetaminophen  (TYLENOL ) 500 MG tablet Take 500 mg by mouth every 6 (six) hours as needed for mild pain (pain score 1-3) or moderate pain (pain score 4-6).   amLODipine  (NORVASC ) 10 MG tablet TAKE 1 TABLET(10 MG) BY MOUTH DAILY   aspirin  EC 81 MG tablet Take 1 tablet (81 mg total) by mouth daily. Swallow whole.   clopidogrel  (PLAVIX ) 75 MG tablet Take 1 tablet (75 mg total) by mouth daily.   Doxylamine  Succinate, Sleep, (SLEEP AID PO) Take 25 mg by mouth at bedtime.   furosemide  (LASIX ) 20 MG tablet Take 1 tablet (20 mg total) by mouth in the morning, THEN 1 tablet (20 mg total) as needed for edema (shortness of breath, or 3 pound weight gain  over night).   gabapentin  (NEURONTIN ) 300 MG capsule Take 1 capsule (300 mg total) by mouth at bedtime.   isosorbide  mononitrate (IMDUR ) 30 MG 24 hr tablet Take 1 tablet (30 mg total) by mouth daily.   metoprolol  succinate (TOPROL  XL) 25 MG 24 hr tablet Take 1 tablet (25 mg total) by mouth daily.   rosuvastatin  (CRESTOR ) 40 MG tablet Take 1 tablet (40 mg total) by mouth daily.   traZODone  (DESYREL ) 100 MG tablet TAKE 1 TABLET(100 MG) BY MOUTH AT BEDTIME   valsartan  (DIOVAN ) 160 MG tablet Take 1 tablet (160 mg total) by mouth at bedtime.    [DISCONTINUED] sitaGLIPtin  (JANUVIA ) 50 MG tablet TAKE 1 TABLET(50 MG) BY MOUTH DAILY   sitaGLIPtin  (JANUVIA ) 50 MG tablet TAKE 1 TABLET(50 MG) BY MOUTH DAILY   No facility-administered encounter medications on file as of 06/12/2024.    Review of Systems  Review of Systems   Objective:   BP (!) 137/46   Pulse (!) 58   Temp 98.2 F (36.8 C) (Oral)   Wt 147 lb 12.8 oz (67 kg)   SpO2 100%   BMI 25.37 kg/m   Wt Readings from Last 5 Encounters:  06/12/24 147 lb 12.8 oz (67 kg)  06/02/24 150 lb 9.6 oz (68.3 kg)  05/14/24 149 lb 14.6 oz (68 kg)  05/05/24 150 lb (68 kg)  03/30/24 150 lb (68 kg)     Physical Exam    Assessment & Plan:   Diabetes mellitus without complication (HCC) -     POCT glycosylated hemoglobin (Hb A1C) -     CBC -     Comprehensive metabolic panel with GFR  Congestive heart failure, unspecified HF chronicity, unspecified heart failure type (HCC) -     CBC -     Comprehensive metabolic panel with GFR  Controlled type 2 diabetes mellitus with microalbuminuria, without long-term current use of insulin  (HCC) -     SITagliptin  Phosphate; TAKE 1 TABLET(50 MG) BY MOUTH DAILY  Dispense: 30 tablet; Refill: 0     Return in about 3 months (around 09/12/2024).   Bascom GORMAN Borer, NP 06/12/2024

## 2024-06-13 LAB — COMPREHENSIVE METABOLIC PANEL WITH GFR
ALT: 20 IU/L (ref 0–32)
AST: 20 IU/L (ref 0–40)
Albumin: 4.3 g/dL (ref 3.8–4.8)
Alkaline Phosphatase: 62 IU/L (ref 44–121)
BUN/Creatinine Ratio: 15 (ref 12–28)
BUN: 15 mg/dL (ref 8–27)
Bilirubin Total: 0.2 mg/dL (ref 0.0–1.2)
CO2: 26 mmol/L (ref 20–29)
Calcium: 9.6 mg/dL (ref 8.7–10.3)
Chloride: 102 mmol/L (ref 96–106)
Creatinine, Ser: 0.97 mg/dL (ref 0.57–1.00)
Globulin, Total: 2.9 g/dL (ref 1.5–4.5)
Glucose: 93 mg/dL (ref 70–99)
Potassium: 4.3 mmol/L (ref 3.5–5.2)
Sodium: 143 mmol/L (ref 134–144)
Total Protein: 7.2 g/dL (ref 6.0–8.5)
eGFR: 60 mL/min/1.73 (ref >59)

## 2024-06-13 LAB — CBC
Hematocrit: 36.7 % (ref 34.0–46.6)
Hemoglobin: 11.5 g/dL (ref 11.1–15.9)
MCH: 31 pg (ref 26.6–33.0)
MCHC: 31.3 g/dL — ABNORMAL LOW (ref 31.5–35.7)
MCV: 99 fL — ABNORMAL HIGH (ref 79–97)
Platelets: 211 x10E3/uL (ref 150–450)
RBC: 3.71 x10E6/uL — ABNORMAL LOW (ref 3.77–5.28)
RDW: 13.3 % (ref 11.7–15.4)
WBC: 5.6 x10E3/uL (ref 3.4–10.8)

## 2024-06-14 ENCOUNTER — Ambulatory Visit: Payer: Self-pay | Admitting: Nurse Practitioner

## 2024-06-17 ENCOUNTER — Other Ambulatory Visit: Payer: Self-pay | Admitting: Nurse Practitioner

## 2024-06-17 DIAGNOSIS — E1129 Type 2 diabetes mellitus with other diabetic kidney complication: Secondary | ICD-10-CM

## 2024-07-06 ENCOUNTER — Other Ambulatory Visit: Payer: Self-pay | Admitting: Nurse Practitioner

## 2024-07-06 DIAGNOSIS — M542 Cervicalgia: Secondary | ICD-10-CM

## 2024-07-20 ENCOUNTER — Other Ambulatory Visit: Payer: Self-pay | Admitting: Nurse Practitioner

## 2024-07-20 DIAGNOSIS — E1129 Type 2 diabetes mellitus with other diabetic kidney complication: Secondary | ICD-10-CM

## 2024-07-21 NOTE — Telephone Encounter (Signed)
 Please advise [facility]

## 2024-07-30 ENCOUNTER — Ambulatory Visit: Payer: Self-pay

## 2024-07-30 NOTE — Telephone Encounter (Signed)
 Pt was called back and advised to got to the ED. Pt has had two episodes of sharpe pain both on left side  about a week apart . Pt pt daughter pain was in her head. Daughter advised she would take her to ED. KH

## 2024-07-30 NOTE — Telephone Encounter (Signed)
 FYI Only or Action Required?: Action required by provider: request for appointment and update on patient condition.  Patient was last seen in primary care on 06/12/2024 by Beth Bascom RAMAN, NP.  Called Nurse Triage reporting Shortness of Breath, Headache, Neck Pain, and Shoulder Pain.  Symptoms began last week.  Interventions attempted: Rest, hydration, or home remedies.  Symptoms are: 2 episodes (once last week and once the beginning of this week) of SOB with left sided headache radiating down neck and left shoulder when bending over completely resolved at this time.  Triage Disposition: See Physician Within 24 Hours  Patient/caregiver understands and will follow disposition?: Unsure           Copied from CRM #[id number]. Topic: Clinical - Red Word Triage >> Jul 30, 2024  2:27 PM Beth Hubbard wrote: Been complaining of sharp pain from head, shoulder and neck for about 2 weeks. And out of breath. Answer Assessment - Initial Assessment Questions Daughter, Beth Hubbard, on the phone for triage and states she is not with patient. Daughter states she does not think urgent care will be able to take proper care and she would need more of a work up/follow up. No available appts with PCP. Advised daughter to call patient's cardiologist as well and advised patient be seen within 24 hours. She asked if a message can be sent to the PCP to see if any appts open up tomorrow or if PCP thinks she can fit the patient in. Daughter verbalized understanding of stroke and cardiac symptoms to monitor for and call back for new or worsening symptoms.   1. RESPIRATORY STATUS: Describe your breathing? (e.g., wheezing, shortness of breath, unable to speak, severe coughing)      Shortness of breath/ out of breath.  2. ONSET: When did this breathing problem begin?      Last week she had an episode and another episode earlier this week, only lasts a few seconds.  3. PATTERN Does the difficult breathing come and go,  or has it been constant since it started?      Comes and goes with the pain episodes (experienced it when she was bending over).  4. SEVERITY: How bad is your breathing? (e.g., mild, moderate, severe)      No SOB or difficulty breathing today.  5. RECURRENT SYMPTOM: Have you had difficulty breathing before? If Yes, ask: When was the last time? and What happened that time?      She has had SOB before and chest pain, but this particular symptoms with the left sided head pain was new.  6. CARDIAC HISTORY: Do you have any history of heart disease? (e.g., heart attack, angina, bypass surgery, angioplasty)       TIA (transient ischemic attack) Hypertension Mitral regurgitation Pulmonary HTN (HCC) Hypertension Acute on chronic diastolic CHF (congestive heart failure) (HCC) CAD (coronary artery disease)  7. LUNG HISTORY: Do you have any history of lung disease?  (e.g., pulmonary embolus, asthma, emphysema)     OSA, acute respiratory failure.  8. CAUSE: What do you think is causing the breathing problem?      Unsure.  9. OTHER SYMPTOMS: Do you have any other symptoms? (e.g., chest pain, cough, dizziness, fever, runny nose)     Left side of head pain radiates to neck and left shoulder, intermittent and only lasts a few seconds (2 episodes in past 2 weeks). Denies chest pain, changes in speech or vision, dizziness, unsteady gait, unilateral numbness or weakness, nausea, vomiting, sweating.  10.  O2 SATURATION MONITOR:  Do you use an oxygen saturation monitor (pulse oximeter) at home? If Yes, ask: What is your reading (oxygen level) today? What is your usual oxygen saturation reading? (e.g., 95%)       She checks it sometimes at home but has not checked it recently.  11. PREGNANCY: Is there any chance you are pregnant? When was your last menstrual period?       N/A.  12. TRAVEL: Have you traveled out of the country in the last month? (e.g., travel history,  exposures)       No.  Protocols used: Breathing Difficulty-A-AH

## 2024-08-07 ENCOUNTER — Ambulatory Visit: Attending: Cardiology | Admitting: Cardiology

## 2024-08-07 ENCOUNTER — Encounter: Payer: Self-pay | Admitting: Cardiology

## 2024-08-07 VITALS — BP 132/68 | HR 68 | Ht 63.0 in | Wt 151.2 lb

## 2024-08-07 DIAGNOSIS — E785 Hyperlipidemia, unspecified: Secondary | ICD-10-CM

## 2024-08-07 DIAGNOSIS — G4733 Obstructive sleep apnea (adult) (pediatric): Secondary | ICD-10-CM

## 2024-08-07 DIAGNOSIS — I5032 Chronic diastolic (congestive) heart failure: Secondary | ICD-10-CM

## 2024-08-07 DIAGNOSIS — I10 Essential (primary) hypertension: Secondary | ICD-10-CM | POA: Diagnosis not present

## 2024-08-07 DIAGNOSIS — I2510 Atherosclerotic heart disease of native coronary artery without angina pectoris: Secondary | ICD-10-CM

## 2024-08-07 DIAGNOSIS — R0789 Other chest pain: Secondary | ICD-10-CM | POA: Diagnosis not present

## 2024-08-07 DIAGNOSIS — I2583 Coronary atherosclerosis due to lipid rich plaque: Secondary | ICD-10-CM

## 2024-08-07 DIAGNOSIS — R519 Headache, unspecified: Secondary | ICD-10-CM

## 2024-08-07 DIAGNOSIS — I340 Nonrheumatic mitral (valve) insufficiency: Secondary | ICD-10-CM

## 2024-08-07 NOTE — Patient Instructions (Signed)
 Medication Instructions:  Your physician recommends that you continue on your current medications as directed. Please refer to the Current Medication list given to you today.  *If you need a refill on your cardiac medications before your next appointment, please call your pharmacy*  Lab Work: None.  If you have labs (blood work) drawn today and your tests are completely normal, you will receive your results only by: MyChart Message (if you have MyChart) OR A paper copy in the mail If you have any lab test that is abnormal or we need to change your treatment, we will call you to review the results.  Testing/Procedures: Your physician has requested that you have an echocardiogram. Echocardiography is a painless test that uses sound waves to create images of your heart. It provides your doctor with information about the size and shape of your heart and how well your heart's chambers and valves are working. This procedure takes approximately one hour. There are no restrictions for this procedure. Please do NOT wear cologne, perfume, aftershave, or lotions (deodorant is allowed). Please arrive 15 minutes prior to your appointment time.  Please note: We ask at that you not bring children with you during ultrasound (echo/ vascular) testing. Due to room size and safety concerns, children are not allowed in the ultrasound rooms during exams. Our front office staff cannot provide observation of children in our lobby area while testing is being conducted. An adult accompanying a patient to their appointment will only be allowed in the ultrasound room at the discretion of the ultrasound technician under special circumstances. We apologize for any inconvenience.  Dr. Shlomo has ordered and MRI/MRA of the brain to assess for headache. An MRI uses a magnet to create a scan.    Follow-Up: At [facility], you and your health needs are our priority.  As part of our continuing mission to provide you  with exceptional heart care, our providers are all part of one team.  This team includes your primary Cardiologist (physician) and Advanced Practice Providers or APPs (Physician Assistants and Nurse Practitioners) who all work together to provide you with the care you need, when you need it.  Your next appointment:   1 year(s)  Provider:   Wilbert Shlomo, MD

## 2024-08-07 NOTE — Progress Notes (Signed)
 Cardiology Office Note:    Date:  08/07/2024   ID:  Beth Hubbard, Beth Hubbard Dec 16, 1945, MRN [id number]  PCP:  Oley Bascom RAMAN, NP  Cardiologist:  None  Sleep Medicine:  Wilbert Bihari, MD  Referring MD: Oley Bascom RAMAN, NP   Chief Complaint  Patient presents with   Coronary Artery Disease   Hypertension   Hyperlipidemia   Congestive Heart Failure   Sleep Apnea    History of Present Illness:    Beth Hubbard is a 78 y.o. female with a hx of CAD, hypertension, hyperlipidemia, DM2, OSA, and TIA.  Coronary CTA showed moderate disease in the proximal LAD borderline significant by CT FFR.  She presented to the hospital 03/30/2024 with chest pain.  LHC 03/30/2024 showed severe single-vessel disease with 75% stenosis in proximal LAD, no PCI.  Plan was if she had recurrence of chest pain on Plavix  monotherapy then go back to DAPT and consider PCI.    She was seen in follow-up in the office in June and after starting Imdur  her chest pain had improved but she remained hypertensive.  She was then seen in the ER 05/14/2024 with shortness of breath with negative troponin and BNP.  She was given 1 dose of IV Lasix  and discharged.  She presented back to the office 06/02/2024 complained of a 3-second sharp chest pain the night before when she got into bed and sat up and it resolved.  She appeared euvolemic at that time.  No changes were made.  She also has a hx of mild OSA with an AHI of 11.9/hr with oxygen desaturations as low as 83% and is on CPAP at 15cm H2O. Unfortunately after a fall where she sustained fractures in some bones in her face and had to have a 7 hours reconstructive surgery and has not been able to tolerate the mask.   We repeated a sleep study 08/11/2022 showing no evidence of obstructive sleep apnea with an AHI of 4.8/h but did have severe snoring.  She is here today for follow-up and is doing well.  She denies any chest pain or pressure, shortness of breath, DOE, PND, orthopnea, lower  extremity edema, dizziness, palpitations (except when exerting herself) or syncope.  She has been having severe sharp pains on the left side of her head. . Past Medical History:  Diagnosis Date   Arthritis    Diabetes mellitus without complication (HCC)    Excessive daytime sleepiness    Glaucoma    High cholesterol    Hyperlipemia    Hypertension    Mitral regurgitation    Moderate by echo 03/2024   OSA (obstructive sleep apnea) 10/18/2015   Mild OSA with AHI 12.5/hr on CPAP at 8cm H2O   Pneumonia    Shingles    TIA (transient ischemic attack)     Past Surgical History:  Procedure Laterality Date   ABDOMINAL SURGERY     BICEPT TENODESIS Left 01/11/2023   Procedure: BICEPS TENODESIS;  Surgeon: Yvone Rush, MD;  Location: [facility];  Service: Orthopedics;  Laterality: Left;   CANTHOPLASTY Right 03/30/2022   Procedure: CANTHOPLASTY;  Surgeon: Rodgers Faden, MD;  Location: [facility] OR;  Service: Ophthalmology;  Laterality: Right;   EYE EXAMINATION UNDER ANESTHESIA Right 03/30/2022   Procedure: EYE EXAM UNDER ANESTHESIA;  Surgeon: Rodgers Faden, MD;  Location: [facility] OR;  Service: Ophthalmology;  Laterality: Right;   EYE SURGERY Bilateral    cataract surgery   LEFT HEART CATH AND CORONARY ANGIOGRAPHY N/A  03/30/2024   Procedure: LEFT HEART CATH AND CORONARY ANGIOGRAPHY;  Surgeon: Mady Bruckner, MD;  Location: [facility];  Service: Cardiovascular;  Laterality: N/A;   ORIF ORBITAL FRACTURE Right 03/30/2022   Procedure: ORBITAL FLOOR FRACTURE REPAIR WITH IMPLANT;  Surgeon: Rodgers Faden, MD;  Location: [facility] OR;  Service: Ophthalmology;  Laterality: Right;   ROTATOR CUFF REPAIR     bilateral   SHOULDER ARTHROSCOPY WITH OPEN ROTATOR CUFF REPAIR AND DISTAL CLAVICLE ACROMINECTOMY Left 01/11/2023   Procedure: ARTHROSCOPY SHOULDER MINI OPEN ROTATOR CUFF REPAIR, SUBACROMIAL DECOMPRESSION AND DISTAL CLAVICLE RESECTION;  Surgeon: Yvone Rush, MD;  Location: [facility];  Service: Orthopedics;  Laterality: Left;   TEE WITHOUT CARDIOVERSION N/A 07/20/2016   Procedure: TRANSESOPHAGEAL ECHOCARDIOGRAM (TEE);  Surgeon: Vinie JAYSON Maxcy, MD;  Location: [facility] ENDOSCOPY;  Service: Cardiovascular;  Laterality: N/A;   TOOTH EXTRACTION Left 10/09/2019   Procedure: DENTAL EXTRACTION X1 and Irrigation and Debridement.;  Surgeon: Sheryle Hamilton, DDS;  Location: [facility] OR;  Service: Oral Surgery;  Laterality: Left;  DENTAL EXTRACTION X1 and Irrigation and Debridement.    Current Medications: Current Meds  Medication Sig   acetaminophen  (TYLENOL ) 500 MG tablet Take 500 mg by mouth every 6 (six) hours as needed for mild pain (pain score 1-3) or moderate pain (pain score 4-6).   amLODipine  (NORVASC ) 10 MG tablet TAKE 1 TABLET(10 MG) BY MOUTH DAILY   aspirin  EC 81 MG tablet Take 1 tablet (81 mg total) by mouth daily. Swallow whole.   clopidogrel  (PLAVIX ) 75 MG tablet Take 1 tablet (75 mg total) by mouth daily.   Doxylamine  Succinate, Sleep, (SLEEP AID PO) Take 25 mg by mouth at bedtime.   furosemide  (LASIX ) 20 MG tablet Take 1 tablet (20 mg total) by mouth in the morning, THEN 1 tablet (20 mg total) as needed for edema (shortness of breath, or 3 pound weight gain over night).   gabapentin  (NEURONTIN ) 300 MG capsule TAKE 1 CAPSULE(300 MG) BY MOUTH AT BEDTIME   isosorbide  mononitrate (IMDUR ) 30 MG 24 hr tablet Take 1 tablet (30 mg total) by mouth daily.   metoprolol  succinate (TOPROL  XL) 25 MG 24 hr tablet Take 1 tablet (25 mg total) by mouth daily.   rosuvastatin  (CRESTOR ) 40 MG tablet Take 1 tablet (40 mg total) by mouth daily.   sitaGLIPtin  (JANUVIA ) 50 MG tablet TAKE 1 TABLET(50 MG) BY MOUTH DAILY   traZODone  (DESYREL ) 100 MG tablet TAKE 1 TABLET(100 MG) BY MOUTH AT BEDTIME   valsartan  (DIOVAN ) 160 MG tablet Take 1 tablet (160 mg total) by mouth at bedtime.     Allergies:   Aspirin , Aspirin , Ibuprofen, Penicillins, Penicillins, and Nsaids   Social History   Socioeconomic  History   Marital status: Legally Separated    Spouse name: Not on file   Number of children: Not on file   Years of education: Not on file   Highest education level: Not on file  Occupational History   Not on file  Tobacco Use   Smoking status: Never   Smokeless tobacco: Never  Vaping Use   Vaping status: Never Used  Substance and Sexual Activity   Alcohol use: Never   Drug use: Never   Sexual activity: Not Currently  Other Topics Concern   Not on file  Social History Narrative   ** Merged History Encounter **       Social Drivers of Health   Financial Resource Strain: Low Risk  (06/12/2024)   Overall Physicist, medical Strain (  CARDIA)    Difficulty of Paying Living Expenses: Not hard at all  Food Insecurity: No Food Insecurity (06/12/2024)   Hunger Vital Sign    Worried About Running Out of Food in the Last Year: Never true    Ran Out of Food in the Last Year: Never true  Transportation Needs: No Transportation Needs (06/12/2024)   PRAPARE - Administrator, Civil Service (Medical): No    Lack of Transportation (Non-Medical): No  Physical Activity: Insufficiently Active (06/12/2024)   Exercise Vital Sign    Days of Exercise per Week: 3 days    Minutes of Exercise per Session: 10 min  Stress: No Stress Concern Present (06/12/2024)   [employer] of Occupational Health - Occupational Stress Questionnaire    Feeling of Stress: Not at all  Social Connections: Unknown (06/12/2024)   Social Connection and Isolation Panel    Frequency of Communication with Friends and Family: More than three times a week    Frequency of Social Gatherings with Friends and Family: More than three times a week    Attends Religious Services: More than 4 times per year    Active Member of [employer] or Organizations: Yes    Attends Engineer, structural: More than 4 times per year    Marital Status: Patient declined     Family History: The patient's family history includes  Cancer in her brother, brother, father, sister, and sister. There is no history of Breast cancer.  ROS:   Please see the history of present illness.    ROS  All other systems reviewed and negative.   EKGs/Labs/Other Studies Reviewed:          Recent Labs: 05/14/2024: B Natriuretic Peptide 72.0 06/12/2024: ALT 20; BUN 15; Creatinine, Ser 0.97; Hemoglobin 11.5; Platelets 211; Potassium 4.3; Sodium 143   Recent Lipid Panel    Component Value Date/Time   CHOL 174 12/13/2023 1043   TRIG 128 12/13/2023 1043   HDL 53 12/13/2023 1043   CHOLHDL 3.3 12/13/2023 1043   CHOLHDL 2.7 04/08/2017 0909   VLDL 27 04/08/2017 0909   LDLCALC 98 12/13/2023 1043    Physical Exam:    VS:  BP 132/68   Pulse 68   Ht 5' 3 (1.6 m)   Wt 151 lb 3.2 oz (68.6 kg)   SpO2 98%   BMI 26.78 kg/m     Wt Readings from Last 3 Encounters:  08/07/24 151 lb 3.2 oz (68.6 kg)  06/12/24 147 lb 12.8 oz (67 kg)  06/02/24 150 lb 9.6 oz (68.3 kg)    GEN: Well nourished, well developed in no acute distress HEENT: Normal NECK: No JVD; No carotid bruits LYMPHATICS: No lymphadenopathy CARDIAC:RRR, no murmurs, rubs, gallops RESPIRATORY:  Clear to auscultation without rales, wheezing or rhonchi  ABDOMEN: Soft, non-tender, non-distended MUSCULOSKELETAL:  No edema; No deformity  SKIN: Warm and dry NEUROLOGIC:  Alert and oriented x 3 PSYCHIATRIC:  Normal affect  ASSESSMENT:    1. OSA (obstructive sleep apnea)   2. Essential hypertension   3. Coronary artery disease due to lipid rich plaque   4. Atypical chest pain   5. Hyperlipidemia with target LDL less than 70   6. Chronic diastolic heart failure (HCC)   7. Nonrheumatic mitral valve regurgitation       PLAN:    In order of problems listed above:  HTN - BP is well-controlled on exam today -Continue amlodipine  10 mg daily, Imdur  30 mg daily, Toprol -XL 25  mg daily and Diovan  160 mg daily with as needed refills-I have personally reviewed and interpreted  outside labs performed by patient's PCP which showed serum creatinine 0.97 and potassium 4.3 on 06/12/2024  Atypical chest pain ASCAD -Coronary CTA showed moderate disease in the proximal LAD borderline significant by CT FFR.  She presented to the hospital 03/30/2024 with chest pain.  LHC 03/30/2024 showed severe single-vessel disease with 75% stenosis in proximal LAD, no PCI.   -Plan was this if she had recurrent chest pain and would put back on DAPT and consider PCI -She was started on Imdur  30 mg daily which is significantly improved her episodes of chest pain -Echo 04/07/2024 showed EF 60 to 65% with G1 DD, mild RV dysfunction, moderate MR - She has not had any further anginal symptoms - Continue aspirin  81 mg daily, Plavix  75 mg daily, Imdur  30 mg daily, Toprol -XL 25 mg daily and Crestor  40 mg daily with PRN Refills  Hyperlipidemia - LDL goal less than 70 -I have personally reviewed and interpreted outside labs performed by patient's PCP which showed LDL 98 and HDL 53 on 12/13/2023, ALT 20 on 06/12/2024 - Repeat FLP and ALT -Continue Crestor  40 mg daily  Chronic diastolic CHF -Appears euvolemic on exam today - Continue Toprol  XL 25 mg daily and Lasix  20 mg daily with an additional 20 mg as needed for edema or shortness of breath or weight gain greater than 3 pounds in a day  Mitral regurgitation - Moderate by echo 04/07/2024 - Repeat 2D echo in 1 year  HA -she has been having sharp pains on the left side of her head recently that are very bad -the pain occurs some when she bends over and stands back up -MRI/MRA of brain to rule out pathology including cerebral aneurysm  Followup with me in 1 year   Medication Adjustments/Labs and Tests Ordered: Current medicines are reviewed at length with the patient today.  Concerns regarding medicines are outlined above.  No orders of the defined types were placed in this encounter.  No orders of the defined types were placed in this  encounter.   Signed, Wilbert Bihari, MD  08/07/2024 8:17 AM    [facility]

## 2024-08-07 NOTE — Addendum Note (Signed)
 Addended by: JANIT GENI CROME on: 08/07/2024 08:31 AM   Modules accepted: Orders

## 2024-08-16 ENCOUNTER — Other Ambulatory Visit: Payer: Self-pay | Admitting: Nurse Practitioner

## 2024-08-16 DIAGNOSIS — E1129 Type 2 diabetes mellitus with other diabetic kidney complication: Secondary | ICD-10-CM

## 2024-08-20 ENCOUNTER — Other Ambulatory Visit: Payer: Self-pay | Admitting: Physician Assistant

## 2024-08-20 DIAGNOSIS — E785 Hyperlipidemia, unspecified: Secondary | ICD-10-CM

## 2024-08-25 ENCOUNTER — Ambulatory Visit (HOSPITAL_COMMUNITY)
Admission: RE | Admit: 2024-08-25 | Discharge: 2024-08-25 | Disposition: A | Discharge status: Home / Self Care | Source: Ambulatory Visit | Attending: Cardiology | Admitting: Cardiology

## 2024-08-25 ENCOUNTER — Other Ambulatory Visit: Payer: Self-pay | Admitting: Cardiology

## 2024-08-25 DIAGNOSIS — R519 Headache, unspecified: Secondary | ICD-10-CM

## 2024-08-25 DIAGNOSIS — I10 Essential (primary) hypertension: Secondary | ICD-10-CM

## 2024-08-25 DIAGNOSIS — I2510 Atherosclerotic heart disease of native coronary artery without angina pectoris: Secondary | ICD-10-CM

## 2024-08-25 DIAGNOSIS — I340 Nonrheumatic mitral (valve) insufficiency: Secondary | ICD-10-CM

## 2024-08-25 DIAGNOSIS — I5032 Chronic diastolic (congestive) heart failure: Secondary | ICD-10-CM

## 2024-08-25 DIAGNOSIS — R0789 Other chest pain: Secondary | ICD-10-CM

## 2024-08-25 DIAGNOSIS — E785 Hyperlipidemia, unspecified: Secondary | ICD-10-CM

## 2024-08-25 DIAGNOSIS — G4733 Obstructive sleep apnea (adult) (pediatric): Secondary | ICD-10-CM

## 2024-08-31 ENCOUNTER — Ambulatory Visit: Payer: Self-pay

## 2024-09-06 ENCOUNTER — Other Ambulatory Visit: Payer: Self-pay | Admitting: Nurse Practitioner

## 2024-09-06 DIAGNOSIS — J302 Other seasonal allergic rhinitis: Secondary | ICD-10-CM

## 2024-09-10 ENCOUNTER — Other Ambulatory Visit: Payer: Self-pay | Admitting: Nurse Practitioner

## 2024-09-10 DIAGNOSIS — E1129 Type 2 diabetes mellitus with other diabetic kidney complication: Secondary | ICD-10-CM

## 2024-09-14 ENCOUNTER — Ambulatory Visit (HOSPITAL_COMMUNITY)
Admission: RE | Admit: 2024-09-14 | Discharge: 2024-09-14 | Disposition: A | Discharge status: Home / Self Care | Source: Ambulatory Visit | Attending: Nurse Practitioner | Admitting: Nurse Practitioner

## 2024-09-14 ENCOUNTER — Ambulatory Visit: Payer: Self-pay | Admitting: Nurse Practitioner

## 2024-09-14 VITALS — BP 132/54 | HR 72 | Wt 151.8 lb

## 2024-09-14 DIAGNOSIS — I10 Essential (primary) hypertension: Secondary | ICD-10-CM | POA: Diagnosis not present

## 2024-09-14 DIAGNOSIS — J069 Acute upper respiratory infection, unspecified: Secondary | ICD-10-CM | POA: Diagnosis not present

## 2024-09-14 DIAGNOSIS — M25551 Pain in right hip: Secondary | ICD-10-CM | POA: Diagnosis not present

## 2024-09-14 DIAGNOSIS — E1129 Type 2 diabetes mellitus with other diabetic kidney complication: Secondary | ICD-10-CM

## 2024-09-14 DIAGNOSIS — R809 Proteinuria, unspecified: Secondary | ICD-10-CM

## 2024-09-14 MED ORDER — AZITHROMYCIN 250 MG PO TABS: ORAL_TABLET | ORAL | 0 refills | Status: AC

## 2024-09-14 MED ORDER — TIZANIDINE HCL 4 MG PO TABS: 4.0000 mg | ORAL_TABLET | Freq: Four times a day (QID) | ORAL | 0 refills | Status: DC | PRN

## 2024-09-14 MED ORDER — VALSARTAN 160 MG PO TABS: 160.0000 mg | ORAL_TABLET | Freq: Every day | ORAL | 3 refills | Status: AC

## 2024-09-14 MED ORDER — PREDNISONE 20 MG PO TABS: 20.0000 mg | ORAL_TABLET | Freq: Every day | ORAL | 0 refills | Status: AC

## 2024-09-14 NOTE — Progress Notes (Signed)
 Subjective   Patient ID: Beth Hubbard, female    DOB: 12-23-1945, 78 y.o.   MRN: [id number]  Chief Complaint  Patient presents with   Diabetes   Arthritis   Hip Pain    RIGHT HIP, HAS BEEN TREATING AREA WITH HEAT, STATES THAT'S THE ONLY THING THAT HELPS     Referring provider: Oley Bascom RAMAN, NP  Beth Hubbard is a 78 y.o. female with Past Medical History: No date: Arthritis No date: Diabetes mellitus without complication (HCC) No date: Excessive daytime sleepiness No date: Glaucoma No date: High cholesterol No date: Hyperlipemia No date: Hypertension No date: Mitral regurgitation     Comment:  Moderate by echo 03/2024 10/18/2015: OSA (obstructive sleep apnea)     Comment:  Mild OSA with AHI 12.5/hr on CPAP at 8cm H2O No date: Pneumonia No date: Shingles No date: TIA (transient ischemic attack)   HPI  Patient presents today for a follow-up on diabetes.  A1c at last visit was 5.9.  Patient does complain of URI symptoms including sinus congestion pressure and pain.  Will trial azithromycin.  Patient does complain of hip pain.  We will trial muscle relaxer and prednisone .  Will order x-ray. Denies f/c/s, n/v/d, hemoptysis, PND, leg swelling Denies chest pain or edema      Allergies  Allergen Reactions   Aspirin      Tremor    Aspirin  Other (See Comments)    Ringing in Ear   Ibuprofen Other (See Comments)    Ringing in Ear   Penicillins Itching and Other (See Comments)    Reaction: itching,headache, dizziness and anxiety. Feels like she is goin off Did it involve swelling of the face/tongue/throat, SOB, or low BP? No Did it involve sudden or severe rash/hives, skin peeling, or any reaction on the inside of your mouth or nose? No Did you need to seek medical attention at a hospital or doctor's office? No When did it last happen?      Childhood If all above answers are "NO", may proceed with cephalosporin use.   Penicillins Other (See Comments)    Patient  fainted   Nsaids Anxiety    Reaction: causes dizziness and headache. Ibuprofen. Aspirin     Immunization History  Administered Date(s) Administered   INFLUENZA, HIGH DOSE SEASONAL PF 09/23/2018   Influenza,inj,Quad PF,6+ Mos 09/23/2020   Influenza-Unspecified 06/19/2013   PFIZER(Purple Top)SARS-COV-2 Vaccination 04/28/2020, 05/27/2020   Pneumococcal Conjugate-13 02/24/2015   Pneumococcal Polysaccharide-23 03/05/2016   Tdap 02/24/2015    Tobacco History: Social History   Tobacco Use  Smoking Status Never  Smokeless Tobacco Never   Counseling given: Not Answered   Outpatient Encounter Medications as of 09/14/2024  Medication Sig   acetaminophen  (TYLENOL ) 500 MG tablet Take 500 mg by mouth every 6 (six) hours as needed for mild pain (pain score 1-3) or moderate pain (pain score 4-6).   amLODipine  (NORVASC ) 10 MG tablet TAKE 1 TABLET(10 MG) BY MOUTH DAILY   aspirin  EC 81 MG tablet Take 1 tablet (81 mg total) by mouth daily. Swallow whole.   [EXPIRED] azithromycin (ZITHROMAX) 250 MG tablet Take 2 tablets on day 1, then 1 tablet daily on days 2 through 5   cetirizine  (ZYRTEC ) 10 MG tablet TAKE 1 TABLET(10 MG) BY MOUTH DAILY   clopidogrel  (PLAVIX ) 75 MG tablet Take 1 tablet (75 mg total) by mouth daily.   dorzolamide  (TRUSOPT ) 2 % ophthalmic solution 1 drop 3 (three) times daily.   Doxylamine  Succinate, Sleep, (SLEEP AID  PO) Take 25 mg by mouth at bedtime.   furosemide  (LASIX ) 20 MG tablet Take 1 tablet (20 mg total) by mouth in the morning, THEN 1 tablet (20 mg total) as needed for edema (shortness of breath, or 3 pound weight gain over night).   isosorbide  mononitrate (IMDUR ) 30 MG 24 hr tablet Take 1 tablet (30 mg total) by mouth daily.   metoprolol  succinate (TOPROL  XL) 25 MG 24 hr tablet Take 1 tablet (25 mg total) by mouth daily.   predniSONE  (DELTASONE ) 20 MG tablet Take 1 tablet (20 mg total) by mouth daily with breakfast.   rosuvastatin  (CRESTOR ) 40 MG tablet Take 1 tablet  (40 mg total) by mouth daily.   sitaGLIPtin  (JANUVIA ) 50 MG tablet TAKE 1 TABLET(50 MG) BY MOUTH DAILY   traZODone  (DESYREL ) 100 MG tablet TAKE 1 TABLET(100 MG) BY MOUTH AT BEDTIME   [DISCONTINUED] gabapentin  (NEURONTIN ) 300 MG capsule TAKE 1 CAPSULE(300 MG) BY MOUTH AT BEDTIME   [DISCONTINUED] tiZANidine  (ZANAFLEX ) 4 MG tablet Take 1 tablet (4 mg total) by mouth every 6 (six) hours as needed for muscle spasms.   [DISCONTINUED] valsartan  (DIOVAN ) 160 MG tablet Take 1 tablet (160 mg total) by mouth at bedtime.   valsartan  (DIOVAN ) 160 MG tablet Take 1 tablet (160 mg total) by mouth at bedtime.   No facility-administered encounter medications on file as of 09/14/2024.    Review of Systems  Review of Systems  Constitutional: Negative.   HENT: Negative.    Cardiovascular: Negative.   Gastrointestinal: Negative.   Musculoskeletal:  Positive for arthralgias and myalgias.  Allergic/Immunologic: Negative.   Neurological: Negative.   Psychiatric/Behavioral: Negative.       Objective:   BP (!) 132/54 (BP Location: Left Arm, Patient Position: Sitting, Cuff Size: Normal)   Pulse 72   Wt 151 lb 12.8 oz (68.9 kg)   SpO2 96%   BMI 26.89 kg/m   Wt Readings from Last 5 Encounters:  09/14/24 151 lb 12.8 oz (68.9 kg)  08/07/24 151 lb 3.2 oz (68.6 kg)  06/12/24 147 lb 12.8 oz (67 kg)  06/02/24 150 lb 9.6 oz (68.3 kg)  05/14/24 149 lb 14.6 oz (68 kg)     Physical Exam Musculoskeletal:     Right hip: Tenderness present. Decreased range of motion.     Left hip: Tenderness present. Decreased range of motion.       Assessment & Plan:   Controlled type 2 diabetes mellitus with microalbuminuria, without long-term current use of insulin  (HCC) -     POCT glycosylated hemoglobin (Hb A1C)  Right hip pain -     DG HIP UNILAT W OR W/O PELVIS 2-3 VIEWS RIGHT  Hypertension, unspecified type -     Valsartan ; Take 1 tablet (160 mg total) by mouth at bedtime.  Dispense: 90 tablet; Refill:  3  Upper respiratory tract infection, unspecified type -     predniSONE ; Take 1 tablet (20 mg total) by mouth daily with breakfast.  Dispense: 5 tablet; Refill: 0 -     Azithromycin; Take 2 tablets on day 1, then 1 tablet daily on days 2 through 5  Dispense: 6 tablet; Refill: 0     Return in about 3 months (around 12/15/2024).   Bascom GORMAN Borer, NP 10/02/2024

## 2024-09-16 ENCOUNTER — Ambulatory Visit: Payer: Self-pay | Admitting: Nurse Practitioner

## 2024-09-19 ENCOUNTER — Other Ambulatory Visit: Payer: Self-pay | Admitting: Nurse Practitioner

## 2024-09-19 DIAGNOSIS — M542 Cervicalgia: Secondary | ICD-10-CM

## 2024-09-23 NOTE — Telephone Encounter (Signed)
 Please advise [facility]

## 2024-10-01 ENCOUNTER — Telehealth: Payer: Self-pay

## 2024-10-01 NOTE — Telephone Encounter (Signed)
 Call to patient to review MRI normal. Patient verbalizes understanding.

## 2024-10-02 ENCOUNTER — Encounter: Payer: Self-pay | Admitting: Nurse Practitioner

## 2024-10-06 ENCOUNTER — Other Ambulatory Visit

## 2024-10-10 ENCOUNTER — Other Ambulatory Visit: Payer: Self-pay | Admitting: Nurse Practitioner

## 2024-10-11 ENCOUNTER — Other Ambulatory Visit: Payer: Self-pay | Admitting: Nurse Practitioner

## 2024-10-11 DIAGNOSIS — E1129 Type 2 diabetes mellitus with other diabetic kidney complication: Secondary | ICD-10-CM

## 2024-10-12 ENCOUNTER — Other Ambulatory Visit: Payer: Self-pay

## 2024-10-13 ENCOUNTER — Other Ambulatory Visit: Payer: Self-pay | Admitting: Nurse Practitioner

## 2024-10-13 MED ORDER — ROSUVASTATIN CALCIUM 40 MG PO TABS: 40.0000 mg | ORAL_TABLET | Freq: Every day | ORAL | 3 refills | Status: AC

## 2024-10-13 NOTE — Telephone Encounter (Signed)
 Please advise [facility]

## 2024-10-13 NOTE — Telephone Encounter (Signed)
 Copied from CRM #[id number]. Topic: Clinical - Medication Refill >> Oct 13, 2024  2:02 PM Everette C wrote: Medication: rosuvastatin  (CRESTOR ) 40 MG tablet [[id number]]  traZODone  (DESYREL ) 100 MG tablet [[id number]]  Has the patient contacted their pharmacy? Yes (Agent: If no, request that the patient contact the pharmacy for the refill. If patient does not wish to contact the pharmacy document the reason why and proceed with request.) (Agent: If yes, when and what did the pharmacy advise?)  This is the patient's preferred pharmacy:  [facility] [phone] - [place], [place] - [address] AT [facility] [address] [place] [address] Phone: [phone] Fax: [phone]  Is this the correct pharmacy for this prescription? Yes If no, delete pharmacy and type the correct one.   Has the prescription been filled recently? Yes  Is the patient out of the medication? Yes  Has the patient been seen for an appointment in the last year OR does the patient have an upcoming appointment? Yes  Can we respond through MyChart? No  Agent: Please be advised that Rx refills may take up to 3 business days. We ask that you follow-up with your pharmacy.

## 2024-10-14 ENCOUNTER — Telehealth: Payer: Self-pay

## 2024-10-14 NOTE — Telephone Encounter (Signed)
 traZODone  (DESYREL ) 100 MG tablet [[id number]]

## 2024-10-17 MED ORDER — TRAZODONE HCL 100 MG PO TABS: 100.0000 mg | ORAL_TABLET | Freq: Every day | ORAL | 2 refills | Status: AC

## 2024-10-20 ENCOUNTER — Other Ambulatory Visit: Payer: Self-pay | Admitting: Nurse Practitioner

## 2024-11-11 ENCOUNTER — Other Ambulatory Visit: Payer: Self-pay | Admitting: Nurse Practitioner

## 2024-11-11 DIAGNOSIS — E1129 Type 2 diabetes mellitus with other diabetic kidney complication: Secondary | ICD-10-CM

## 2024-12-10 ENCOUNTER — Ambulatory Visit: Payer: Self-pay | Admitting: Nurse Practitioner

## 2024-12-10 ENCOUNTER — Other Ambulatory Visit: Payer: Self-pay | Admitting: Orthopedic Surgery

## 2024-12-10 ENCOUNTER — Other Ambulatory Visit (HOSPITAL_COMMUNITY): Payer: Self-pay

## 2024-12-10 VITALS — BP 162/67 | HR 73 | Temp 97.4°F | Wt 155.2 lb

## 2024-12-10 DIAGNOSIS — Z13220 Encounter for screening for lipoid disorders: Secondary | ICD-10-CM | POA: Diagnosis not present

## 2024-12-10 DIAGNOSIS — Z1329 Encounter for screening for other suspected endocrine disorder: Secondary | ICD-10-CM

## 2024-12-10 DIAGNOSIS — R809 Proteinuria, unspecified: Secondary | ICD-10-CM | POA: Diagnosis not present

## 2024-12-10 DIAGNOSIS — E1129 Type 2 diabetes mellitus with other diabetic kidney complication: Secondary | ICD-10-CM | POA: Diagnosis not present

## 2024-12-10 DIAGNOSIS — M542 Cervicalgia: Secondary | ICD-10-CM

## 2024-12-10 LAB — POCT GLYCOSYLATED HEMOGLOBIN (HGB A1C): Hemoglobin A1C: 6.2 % — AB (ref 4.0–5.6)

## 2024-12-10 MED ORDER — DEXCOM G7 SENSOR MISC: 1.0000 | Freq: Every day | 2 refills | Status: AC

## 2024-12-10 MED ORDER — GABAPENTIN 300 MG PO CAPS: 300.0000 mg | ORAL_CAPSULE | Freq: Every day | ORAL | 0 refills | Status: AC

## 2024-12-10 MED ORDER — DEXCOM G7 RECEIVER DEVI: 1.0000 | Freq: Every day | 2 refills | Status: AC

## 2024-12-10 MED ORDER — AMLODIPINE BESYLATE 10 MG PO TABS: 10.0000 mg | ORAL_TABLET | Freq: Every day | ORAL | 0 refills | Status: AC

## 2024-12-10 MED ORDER — SITAGLIPTIN PHOSPHATE 50 MG PO TABS: ORAL_TABLET | ORAL | 0 refills | Status: AC

## 2024-12-10 NOTE — Progress Notes (Signed)
 "  Subjective   Patient ID: Beth Hubbard, female    DOB: May 31, 1946, 79 y.o.   MRN: [id number]  Chief Complaint  Patient presents with   Diabetes    3 month.  Would like the dexcom or freestyle libre prescribed.     Referring provider: Oley Bascom RAMAN, NP  Beth Hubbard is a 79 y.o. female with Past Medical History: No date: Arthritis No date: Diabetes mellitus without complication (HCC) No date: Excessive daytime sleepiness No date: Glaucoma No date: High cholesterol No date: Hyperlipemia No date: Hypertension No date: Mitral regurgitation     Comment:  Moderate by echo 03/2024 10/18/2015: OSA (obstructive sleep apnea)     Comment:  Mild OSA with AHI 12.5/hr on CPAP at 8cm H2O No date: Pneumonia No date: Shingles No date: TIA (transient ischemic attack)   HPI  Patient presents today for follow-up visit.  Overall she is doing well.  She does need refills on some of her medications today.  She does need labs today.  Patient is requesting a prescription for CGM today we will send in a prescription but do not know if it will be covered by insurance.  Patient's blood pressure was elevated in office today.  She states that she did not take her medications this morning. Denies f/c/s, n/v/d, hemoptysis, PND, leg swelling Denies chest pain or edema    Allergies[1]  Immunization History  Administered Date(s) Administered   INFLUENZA, HIGH DOSE SEASONAL PF 09/23/2018   Influenza,inj,Quad PF,6+ Mos 09/23/2020   Influenza-Unspecified 06/19/2013   PFIZER(Purple Top)SARS-COV-2 Vaccination 04/28/2020, 05/27/2020   Pneumococcal Conjugate-13 02/24/2015   Pneumococcal Polysaccharide-23 03/05/2016   Tdap 02/24/2015    Tobacco History: Tobacco Use History[2] Counseling given: Not Answered   Outpatient Encounter Medications as of 12/10/2024  Medication Sig   acetaminophen  (TYLENOL ) 500 MG tablet Take 500 mg by mouth every 6 (six) hours as needed for mild pain (pain score 1-3) or  moderate pain (pain score 4-6).   aspirin  EC 81 MG tablet Take 1 tablet (81 mg total) by mouth daily. Swallow whole.   cetirizine  (ZYRTEC ) 10 MG tablet TAKE 1 TABLET(10 MG) BY MOUTH DAILY   Continuous Glucose Receiver (DEXCOM G7 RECEIVER) DEVI 1 Application by Does not apply route daily.   Continuous Glucose Sensor (DEXCOM G7 SENSOR) MISC 1 Application by Does not apply route daily.   dorzolamide  (TRUSOPT ) 2 % ophthalmic solution 1 drop 3 (three) times daily.   Doxylamine  Succinate, Sleep, (SLEEP AID PO) Take 25 mg by mouth at bedtime.   isosorbide  mononitrate (IMDUR ) 30 MG 24 hr tablet Take 1 tablet (30 mg total) by mouth daily.   rosuvastatin  (CRESTOR ) 40 MG tablet Take 1 tablet (40 mg total) by mouth daily.   traZODone  (DESYREL ) 100 MG tablet Take 1 tablet (100 mg total) by mouth at bedtime.   valsartan  (DIOVAN ) 160 MG tablet Take 1 tablet (160 mg total) by mouth at bedtime.   [DISCONTINUED] amLODipine  (NORVASC ) 10 MG tablet TAKE 1 TABLET(10 MG) BY MOUTH DAILY   [DISCONTINUED] gabapentin  (NEURONTIN ) 300 MG capsule TAKE 1 CAPSULE(300 MG) BY MOUTH AT BEDTIME   amLODipine  (NORVASC ) 10 MG tablet Take 1 tablet (10 mg total) by mouth daily.   clopidogrel  (PLAVIX ) 75 MG tablet Take 1 tablet (75 mg total) by mouth daily.   furosemide  (LASIX ) 20 MG tablet Take 1 tablet (20 mg total) by mouth in the morning, THEN 1 tablet (20 mg total) as needed for edema (shortness of breath, or 3  pound weight gain over night).   gabapentin  (NEURONTIN ) 300 MG capsule Take 1 capsule (300 mg total) by mouth at bedtime.   metoprolol  succinate (TOPROL  XL) 25 MG 24 hr tablet Take 1 tablet (25 mg total) by mouth daily.   predniSONE  (DELTASONE ) 20 MG tablet Take 1 tablet (20 mg total) by mouth daily with breakfast. (Patient not taking: Reported on 12/10/2024)   sitaGLIPtin  (JANUVIA ) 50 MG tablet TAKE 1 TABLET(50 MG) BY MOUTH DAILY   [DISCONTINUED] sitaGLIPtin  (JANUVIA ) 50 MG tablet TAKE 1 TABLET(50 MG) BY MOUTH DAILY   No  facility-administered encounter medications on file as of 12/10/2024.    Review of Systems  Review of Systems  Constitutional: Negative.   HENT: Negative.    Cardiovascular: Negative.   Gastrointestinal: Negative.   Allergic/Immunologic: Negative.   Neurological: Negative.   Psychiatric/Behavioral: Negative.       Objective:   BP (!) 162/67   Pulse 73   Temp (!) 97.4 F (36.3 C) (Temporal)   Wt 155 lb 3.2 oz (70.4 kg)   SpO2 97%   BMI 27.49 kg/m   Wt Readings from Last 5 Encounters:  12/10/24 155 lb 3.2 oz (70.4 kg)  09/14/24 151 lb 12.8 oz (68.9 kg)  08/07/24 151 lb 3.2 oz (68.6 kg)  06/12/24 147 lb 12.8 oz (67 kg)  06/02/24 150 lb 9.6 oz (68.3 kg)     Physical Exam Vitals and nursing note reviewed.  Constitutional:      General: She is not in acute distress.    Appearance: She is well-developed.  Cardiovascular:     Rate and Rhythm: Normal rate and regular rhythm.  Pulmonary:     Effort: Pulmonary effort is normal.     Breath sounds: Normal breath sounds.  Neurological:     Mental Status: She is alert and oriented to person, place, and time.       Assessment & Plan:   Controlled type 2 diabetes mellitus with microalbuminuria, without long-term current use of insulin  (HCC) -     Microalbumin / creatinine urine ratio -     POCT glycosylated hemoglobin (Hb A1C) -     SITagliptin  Phosphate; TAKE 1 TABLET(50 MG) BY MOUTH DAILY  Dispense: 30 tablet; Refill: 0 -     CBC -     Comprehensive metabolic panel with GFR -     Dexcom G7 Sensor; 1 Application by Does not apply route daily.  Dispense: 1 each; Refill: 2 -     Dexcom G7 Receiver; 1 Application by Does not apply route daily.  Dispense: 1 each; Refill: 2  Cervicalgia -     Gabapentin ; Take 1 capsule (300 mg total) by mouth at bedtime.  Dispense: 90 capsule; Refill: 0  Lipid screening -     Lipid panel  Thyroid disorder screen -     TSH  Other orders -     amLODIPine  Besylate; Take 1 tablet (10 mg  total) by mouth daily.  Dispense: 90 tablet; Refill: 0     Return in about 3 months (around 03/10/2025).   Bascom GORMAN Borer, NP 12/10/2024     [1]  Allergies Allergen Reactions   Aspirin      Tremor    Aspirin  Other (See Comments)    Ringing in Ear   Ibuprofen Other (See Comments)    Ringing in Ear   Penicillins Itching and Other (See Comments)    Reaction: itching,headache, dizziness and anxiety. Feels like she is goin off Did it involve  swelling of the face/tongue/throat, SOB, or low BP? No Did it involve sudden or severe rash/hives, skin peeling, or any reaction on the inside of your mouth or nose? No Did you need to seek medical attention at a hospital or doctor's office? No When did it last happen?      Childhood If all above answers are NO, may proceed with cephalosporin use.   Penicillins Other (See Comments)    Patient fainted   Nsaids Anxiety    Reaction: causes dizziness and headache. Ibuprofen. Aspirin   [2]  Social History Tobacco Use  Smoking Status Never  Smokeless Tobacco Never   "

## 2024-12-11 ENCOUNTER — Other Ambulatory Visit: Payer: Self-pay | Admitting: Nurse Practitioner

## 2024-12-11 ENCOUNTER — Ambulatory Visit: Payer: Self-pay | Admitting: Nurse Practitioner

## 2024-12-11 DIAGNOSIS — E1129 Type 2 diabetes mellitus with other diabetic kidney complication: Secondary | ICD-10-CM

## 2024-12-11 LAB — TSH: TSH: 0.209 u[IU]/mL — ABNORMAL LOW (ref 0.450–4.500)

## 2024-12-11 LAB — COMPREHENSIVE METABOLIC PANEL WITH GFR
ALT: 33 IU/L — ABNORMAL HIGH (ref 0–32)
AST: 32 IU/L (ref 0–40)
Albumin: 4.6 g/dL (ref 3.8–4.8)
Alkaline Phosphatase: 60 IU/L (ref 49–135)
BUN/Creatinine Ratio: 21 (ref 12–28)
BUN: 19 mg/dL (ref 8–27)
Bilirubin Total: 0.2 mg/dL (ref 0.0–1.2)
CO2: 28 mmol/L (ref 20–29)
Calcium: 9.3 mg/dL (ref 8.7–10.3)
Chloride: 102 mmol/L (ref 96–106)
Creatinine, Ser: 0.92 mg/dL (ref 0.57–1.00)
Globulin, Total: 2.7 g/dL (ref 1.5–4.5)
Glucose: 113 mg/dL — ABNORMAL HIGH (ref 70–99)
Potassium: 3.9 mmol/L (ref 3.5–5.2)
Sodium: 145 mmol/L — ABNORMAL HIGH (ref 134–144)
Total Protein: 7.3 g/dL (ref 6.0–8.5)
eGFR: 63 mL/min/1.73

## 2024-12-11 LAB — CBC
Hematocrit: 36.8 % (ref 34.0–46.6)
Hemoglobin: 11.5 g/dL (ref 11.1–15.9)
MCH: 30.7 pg (ref 26.6–33.0)
MCHC: 31.3 g/dL — ABNORMAL LOW (ref 31.5–35.7)
MCV: 98 fL — ABNORMAL HIGH (ref 79–97)
Platelets: 197 x10E3/uL (ref 150–450)
RBC: 3.74 x10E6/uL — ABNORMAL LOW (ref 3.77–5.28)
RDW: 13.5 % (ref 11.7–15.4)
WBC: 17.4 x10E3/uL — ABNORMAL HIGH (ref 3.4–10.8)

## 2024-12-11 LAB — LIPID PANEL
Chol/HDL Ratio: 2.4 ratio (ref 0.0–4.4)
Cholesterol, Total: 193 mg/dL (ref 100–199)
HDL: 81 mg/dL
LDL Chol Calc (NIH): 96 mg/dL (ref 0–99)
Triglycerides: 90 mg/dL (ref 0–149)
VLDL Cholesterol Cal: 16 mg/dL (ref 5–40)

## 2024-12-11 NOTE — Telephone Encounter (Signed)
 JANUVIA  50 MG tablet [Pharmacy Med Name: JANUVIA  50MG  TABLETS]

## 2024-12-12 LAB — MICROALBUMIN / CREATININE URINE RATIO
Creatinine, Urine: 104.4 mg/dL
Microalb/Creat Ratio: 45 mg/g{creat} — ABNORMAL HIGH (ref 0–29)
Microalbumin, Urine: 46.6 ug/mL

## 2024-12-14 ENCOUNTER — Inpatient Hospital Stay (HOSPITAL_BASED_OUTPATIENT_CLINIC_OR_DEPARTMENT_OTHER): Admission: RE | Admit: 2024-12-14 | Source: Ambulatory Visit

## 2024-12-18 ENCOUNTER — Other Ambulatory Visit: Payer: Self-pay | Admitting: Orthopedic Surgery

## 2024-12-18 DIAGNOSIS — M5416 Radiculopathy, lumbar region: Secondary | ICD-10-CM

## 2025-01-05 ENCOUNTER — Other Ambulatory Visit

## 2025-01-16 ENCOUNTER — Other Ambulatory Visit (HOSPITAL_BASED_OUTPATIENT_CLINIC_OR_DEPARTMENT_OTHER)

## 2025-02-04 ENCOUNTER — Ambulatory Visit: Payer: Self-pay

## 2025-02-09 ENCOUNTER — Ambulatory Visit

## 2025-03-24 ENCOUNTER — Other Ambulatory Visit (HOSPITAL_COMMUNITY)
